# Patient Record
Sex: Male | Born: 1940
Health system: Southern US, Community
[De-identification: ages and names within clinical notes are randomized; demographics above are authoritative.]

## PROBLEM LIST (undated history)

## (undated) DIAGNOSIS — L97919 Non-pressure chronic ulcer of unspecified part of right lower leg with unspecified severity: Secondary | ICD-10-CM

## (undated) DIAGNOSIS — L97929 Non-pressure chronic ulcer of unspecified part of left lower leg with unspecified severity: Secondary | ICD-10-CM

## (undated) DIAGNOSIS — I10 Essential (primary) hypertension: Secondary | ICD-10-CM

## (undated) DIAGNOSIS — I509 Heart failure, unspecified: Secondary | ICD-10-CM

## (undated) DIAGNOSIS — J90 Pleural effusion, not elsewhere classified: Secondary | ICD-10-CM

## (undated) DIAGNOSIS — J189 Pneumonia, unspecified organism: Secondary | ICD-10-CM

## (undated) DIAGNOSIS — I872 Venous insufficiency (chronic) (peripheral): Secondary | ICD-10-CM

## (undated) DIAGNOSIS — C61 Malignant neoplasm of prostate: Secondary | ICD-10-CM

## (undated) DIAGNOSIS — E119 Type 2 diabetes mellitus without complications: Secondary | ICD-10-CM

## (undated) DIAGNOSIS — E785 Hyperlipidemia, unspecified: Secondary | ICD-10-CM

---

## 2001-06-05 ENCOUNTER — Encounter: Admission: RE | Admit: 2001-06-05 | Discharge: 2001-07-10 | Payer: Self-pay | Admitting: Family Medicine

## 2002-02-12 ENCOUNTER — Encounter: Admission: RE | Admit: 2002-02-12 | Discharge: 2002-05-13 | Payer: Self-pay | Admitting: Family Medicine

## 2006-02-16 ENCOUNTER — Encounter: Admission: RE | Admit: 2006-02-16 | Discharge: 2006-05-17 | Payer: Self-pay | Admitting: Family Medicine

## 2006-02-16 ENCOUNTER — Ambulatory Visit: Payer: Self-pay | Admitting: Internal Medicine

## 2006-05-18 ENCOUNTER — Encounter: Admission: RE | Admit: 2006-05-18 | Discharge: 2006-05-18 | Payer: Self-pay | Admitting: Family Medicine

## 2006-08-29 ENCOUNTER — Encounter (HOSPITAL_BASED_OUTPATIENT_CLINIC_OR_DEPARTMENT_OTHER): Admission: RE | Admit: 2006-08-29 | Discharge: 2006-11-27 | Payer: Self-pay | Admitting: Surgery

## 2006-12-05 ENCOUNTER — Encounter (HOSPITAL_BASED_OUTPATIENT_CLINIC_OR_DEPARTMENT_OTHER): Admission: RE | Admit: 2006-12-05 | Discharge: 2006-12-07 | Payer: Self-pay | Admitting: Surgery

## 2006-12-12 ENCOUNTER — Encounter (HOSPITAL_BASED_OUTPATIENT_CLINIC_OR_DEPARTMENT_OTHER): Admission: RE | Admit: 2006-12-12 | Discharge: 2007-03-12 | Payer: Self-pay | Admitting: Surgery

## 2007-03-19 ENCOUNTER — Encounter (HOSPITAL_BASED_OUTPATIENT_CLINIC_OR_DEPARTMENT_OTHER): Admission: RE | Admit: 2007-03-19 | Discharge: 2007-06-17 | Payer: Self-pay | Admitting: Surgery

## 2007-06-17 ENCOUNTER — Ambulatory Visit: Payer: Self-pay | Admitting: Vascular Surgery

## 2007-06-17 ENCOUNTER — Ambulatory Visit (HOSPITAL_COMMUNITY): Admission: RE | Admit: 2007-06-17 | Discharge: 2007-06-17 | Payer: Self-pay | Admitting: *Deleted

## 2007-06-17 ENCOUNTER — Encounter (HOSPITAL_BASED_OUTPATIENT_CLINIC_OR_DEPARTMENT_OTHER): Payer: Self-pay | Admitting: Internal Medicine

## 2007-06-20 ENCOUNTER — Encounter (HOSPITAL_BASED_OUTPATIENT_CLINIC_OR_DEPARTMENT_OTHER): Admission: RE | Admit: 2007-06-20 | Discharge: 2007-09-10 | Payer: Self-pay | Admitting: Internal Medicine

## 2007-09-16 ENCOUNTER — Encounter (HOSPITAL_BASED_OUTPATIENT_CLINIC_OR_DEPARTMENT_OTHER): Admission: RE | Admit: 2007-09-16 | Discharge: 2007-12-15 | Payer: Self-pay | Admitting: Surgery

## 2007-12-10 ENCOUNTER — Encounter (HOSPITAL_BASED_OUTPATIENT_CLINIC_OR_DEPARTMENT_OTHER): Admission: RE | Admit: 2007-12-10 | Discharge: 2008-03-09 | Payer: Self-pay | Admitting: Surgery

## 2008-03-10 ENCOUNTER — Encounter (HOSPITAL_BASED_OUTPATIENT_CLINIC_OR_DEPARTMENT_OTHER): Admission: RE | Admit: 2008-03-10 | Discharge: 2008-06-08 | Payer: Self-pay | Admitting: Internal Medicine

## 2008-06-10 ENCOUNTER — Encounter (HOSPITAL_BASED_OUTPATIENT_CLINIC_OR_DEPARTMENT_OTHER): Admission: RE | Admit: 2008-06-10 | Discharge: 2008-09-04 | Payer: Self-pay | Admitting: Internal Medicine

## 2008-09-16 ENCOUNTER — Encounter (HOSPITAL_BASED_OUTPATIENT_CLINIC_OR_DEPARTMENT_OTHER): Admission: RE | Admit: 2008-09-16 | Discharge: 2008-12-09 | Payer: Self-pay | Admitting: Internal Medicine

## 2008-12-15 ENCOUNTER — Encounter (HOSPITAL_BASED_OUTPATIENT_CLINIC_OR_DEPARTMENT_OTHER): Admission: RE | Admit: 2008-12-15 | Discharge: 2009-03-15 | Payer: Self-pay | Admitting: Internal Medicine

## 2009-02-05 ENCOUNTER — Encounter: Admission: RE | Admit: 2009-02-05 | Discharge: 2009-02-25 | Payer: Self-pay | Admitting: Family Medicine

## 2009-03-16 ENCOUNTER — Encounter (HOSPITAL_BASED_OUTPATIENT_CLINIC_OR_DEPARTMENT_OTHER): Admission: RE | Admit: 2009-03-16 | Discharge: 2009-06-14 | Payer: Self-pay | Admitting: Internal Medicine

## 2009-06-16 ENCOUNTER — Encounter (HOSPITAL_BASED_OUTPATIENT_CLINIC_OR_DEPARTMENT_OTHER): Admission: RE | Admit: 2009-06-16 | Discharge: 2009-09-14 | Payer: Self-pay | Admitting: Internal Medicine

## 2009-09-22 ENCOUNTER — Encounter (HOSPITAL_BASED_OUTPATIENT_CLINIC_OR_DEPARTMENT_OTHER): Admission: RE | Admit: 2009-09-22 | Discharge: 2009-11-22 | Payer: Self-pay | Admitting: Internal Medicine

## 2009-12-14 ENCOUNTER — Encounter (HOSPITAL_BASED_OUTPATIENT_CLINIC_OR_DEPARTMENT_OTHER): Admission: RE | Admit: 2009-12-14 | Discharge: 2010-03-14 | Payer: Self-pay | Admitting: Internal Medicine

## 2009-12-16 ENCOUNTER — Ambulatory Visit (HOSPITAL_COMMUNITY): Admission: RE | Admit: 2009-12-16 | Discharge: 2009-12-16 | Payer: Self-pay | Admitting: Urology

## 2009-12-28 ENCOUNTER — Ambulatory Visit: Admission: RE | Admit: 2009-12-28 | Discharge: 2010-03-23 | Payer: Self-pay | Admitting: Radiation Oncology

## 2010-03-24 ENCOUNTER — Ambulatory Visit: Admission: RE | Admit: 2010-03-24 | Discharge: 2010-05-20 | Payer: Self-pay | Admitting: Radiation Oncology

## 2010-09-05 ENCOUNTER — Inpatient Hospital Stay (HOSPITAL_COMMUNITY): Admission: EM | Admit: 2010-09-05 | Discharge: 2010-09-13 | Payer: Self-pay | Admitting: Emergency Medicine

## 2010-09-05 ENCOUNTER — Encounter: Payer: Self-pay | Admitting: Family Medicine

## 2010-09-05 LAB — CONVERTED CEMR LAB: Hgb A1c MFr Bld: 6.9 %

## 2010-09-08 ENCOUNTER — Ambulatory Visit: Payer: Self-pay | Admitting: Vascular Surgery

## 2010-09-09 ENCOUNTER — Ambulatory Visit: Payer: Self-pay | Admitting: Infectious Diseases

## 2010-09-10 ENCOUNTER — Encounter: Payer: Self-pay | Admitting: Family Medicine

## 2010-09-11 ENCOUNTER — Encounter: Payer: Self-pay | Admitting: Family Medicine

## 2010-09-11 LAB — CONVERTED CEMR LAB
Creatinine, Ser: 1.1 mg/dL
Glucose, Urine, Semiquant: 90
Sodium: 140 meq/L

## 2010-09-13 ENCOUNTER — Ambulatory Visit: Payer: Self-pay | Admitting: Family Medicine

## 2010-09-13 DIAGNOSIS — L97909 Non-pressure chronic ulcer of unspecified part of unspecified lower leg with unspecified severity: Secondary | ICD-10-CM

## 2010-09-13 DIAGNOSIS — E1169 Type 2 diabetes mellitus with other specified complication: Secondary | ICD-10-CM | POA: Insufficient documentation

## 2010-09-13 DIAGNOSIS — E785 Hyperlipidemia, unspecified: Secondary | ICD-10-CM | POA: Insufficient documentation

## 2010-09-13 DIAGNOSIS — I10 Essential (primary) hypertension: Secondary | ICD-10-CM

## 2010-09-13 DIAGNOSIS — C61 Malignant neoplasm of prostate: Secondary | ICD-10-CM

## 2010-09-13 DIAGNOSIS — F341 Dysthymic disorder: Secondary | ICD-10-CM

## 2010-09-13 DIAGNOSIS — M549 Dorsalgia, unspecified: Secondary | ICD-10-CM

## 2010-09-13 DIAGNOSIS — Z96649 Presence of unspecified artificial hip joint: Secondary | ICD-10-CM

## 2010-09-13 DIAGNOSIS — I83009 Varicose veins of unspecified lower extremity with ulcer of unspecified site: Secondary | ICD-10-CM

## 2010-09-13 DIAGNOSIS — N3941 Urge incontinence: Secondary | ICD-10-CM | POA: Insufficient documentation

## 2010-09-13 DIAGNOSIS — I872 Venous insufficiency (chronic) (peripheral): Secondary | ICD-10-CM

## 2010-09-13 DIAGNOSIS — M1610 Unilateral primary osteoarthritis, unspecified hip: Secondary | ICD-10-CM | POA: Insufficient documentation

## 2010-09-13 DIAGNOSIS — M169 Osteoarthritis of hip, unspecified: Secondary | ICD-10-CM

## 2010-09-13 DIAGNOSIS — R269 Unspecified abnormalities of gait and mobility: Secondary | ICD-10-CM

## 2010-09-21 ENCOUNTER — Encounter: Payer: Self-pay | Admitting: Pharmacist

## 2010-09-28 ENCOUNTER — Encounter: Payer: Self-pay | Admitting: Pharmacist

## 2010-09-29 ENCOUNTER — Encounter: Payer: Self-pay | Admitting: Family Medicine

## 2010-09-30 ENCOUNTER — Encounter: Payer: Self-pay | Admitting: Family Medicine

## 2010-09-30 ENCOUNTER — Telehealth: Payer: Self-pay | Admitting: Family Medicine

## 2010-10-05 ENCOUNTER — Encounter: Payer: Self-pay | Admitting: Family Medicine

## 2010-10-05 ENCOUNTER — Encounter: Payer: Self-pay | Admitting: Pharmacist

## 2010-10-19 ENCOUNTER — Encounter: Payer: Self-pay | Admitting: Family Medicine

## 2010-11-01 ENCOUNTER — Encounter: Payer: Self-pay | Admitting: Family Medicine

## 2010-11-02 ENCOUNTER — Ambulatory Visit: Payer: Self-pay | Admitting: Family Medicine

## 2010-11-03 ENCOUNTER — Encounter: Payer: Self-pay | Admitting: Family Medicine

## 2010-11-03 LAB — CONVERTED CEMR LAB
ALT: 11 units/L
AST: 16 units/L
Alkaline Phosphatase: 120 units/L
BUN: 22 mg/dL
Potassium: 4.2 meq/L
Sodium: 141 meq/L

## 2010-11-16 ENCOUNTER — Encounter: Payer: Self-pay | Admitting: Pharmacist

## 2010-11-16 ENCOUNTER — Encounter: Payer: Self-pay | Admitting: Family Medicine

## 2010-11-16 DIAGNOSIS — L89609 Pressure ulcer of unspecified heel, unspecified stage: Secondary | ICD-10-CM | POA: Insufficient documentation

## 2010-11-23 ENCOUNTER — Encounter: Payer: Self-pay | Admitting: Family Medicine

## 2010-11-23 ENCOUNTER — Encounter: Payer: Self-pay | Admitting: Pharmacist

## 2010-11-23 DIAGNOSIS — M67919 Unspecified disorder of synovium and tendon, unspecified shoulder: Secondary | ICD-10-CM | POA: Insufficient documentation

## 2010-11-23 DIAGNOSIS — M719 Bursopathy, unspecified: Secondary | ICD-10-CM

## 2010-11-30 ENCOUNTER — Encounter: Payer: Self-pay | Admitting: Family Medicine

## 2010-11-30 LAB — CONVERTED CEMR LAB
Cholesterol: 109 mg/dL
HDL: 37 mg/dL
Triglycerides: 40 mg/dL

## 2010-12-02 ENCOUNTER — Encounter: Payer: Self-pay | Admitting: Family Medicine

## 2010-12-02 LAB — CONVERTED CEMR LAB
HCT: 35.6 %
MCV: 90.6 fL
WBC: 6.6 10*3/uL

## 2010-12-07 ENCOUNTER — Encounter: Payer: Self-pay | Admitting: Family Medicine

## 2010-12-09 ENCOUNTER — Encounter: Payer: Self-pay | Admitting: Family Medicine

## 2010-12-09 LAB — CONVERTED CEMR LAB
BUN: 19 mg/dL
Calcium: 8.7 mg/dL
Creatinine, Ser: 0.95 mg/dL
Glucose: 130 mg/dL
Potassium: 4.2 meq/L
Sodium: 142 meq/L

## 2010-12-19 ENCOUNTER — Encounter: Payer: Self-pay | Admitting: Family Medicine

## 2010-12-23 ENCOUNTER — Encounter: Payer: Self-pay | Admitting: Family Medicine

## 2011-01-01 ENCOUNTER — Encounter: Payer: Self-pay | Admitting: Urology

## 2011-01-01 DIAGNOSIS — S81809A Unspecified open wound, unspecified lower leg, initial encounter: Secondary | ICD-10-CM

## 2011-01-01 DIAGNOSIS — S91009A Unspecified open wound, unspecified ankle, initial encounter: Secondary | ICD-10-CM

## 2011-01-01 DIAGNOSIS — S81009A Unspecified open wound, unspecified knee, initial encounter: Secondary | ICD-10-CM | POA: Insufficient documentation

## 2011-01-02 ENCOUNTER — Encounter (HOSPITAL_BASED_OUTPATIENT_CLINIC_OR_DEPARTMENT_OTHER)
Admission: RE | Admit: 2011-01-02 | Discharge: 2011-01-10 | Payer: Self-pay | Source: Home / Self Care | Attending: General Surgery | Admitting: General Surgery

## 2011-01-04 ENCOUNTER — Encounter: Payer: Self-pay | Admitting: Pharmacist

## 2011-01-06 ENCOUNTER — Telehealth: Payer: Self-pay | Admitting: *Deleted

## 2011-01-10 ENCOUNTER — Encounter: Payer: Self-pay | Admitting: Family Medicine

## 2011-01-12 ENCOUNTER — Encounter (INDEPENDENT_AMBULATORY_CARE_PROVIDER_SITE_OTHER): Payer: Self-pay | Admitting: Family Medicine

## 2011-01-12 ENCOUNTER — Encounter: Payer: Self-pay | Admitting: Family Medicine

## 2011-01-12 ENCOUNTER — Ambulatory Visit: Admit: 2011-01-12 | Payer: Self-pay

## 2011-01-12 DIAGNOSIS — I83009 Varicose veins of unspecified lower extremity with ulcer of unspecified site: Secondary | ICD-10-CM

## 2011-01-12 DIAGNOSIS — M169 Osteoarthritis of hip, unspecified: Secondary | ICD-10-CM

## 2011-01-12 DIAGNOSIS — I872 Venous insufficiency (chronic) (peripheral): Secondary | ICD-10-CM

## 2011-01-12 LAB — CONVERTED CEMR LAB
BUN: 32 mg/dL — ABNORMAL HIGH (ref 6–23)
BUN: 32 mg/dL — ABNORMAL HIGH (ref 6–23)
Calcium: 9.2 mg/dL (ref 8.4–10.5)
Calcium: 9.2 mg/dL (ref 8.4–10.5)
Chloride: 103 meq/L (ref 96–112)
Chloride: 103 meq/L (ref 96–112)
Creatinine, Ser: 1.22 mg/dL (ref 0.40–1.50)
Glucose, Bld: 163 mg/dL — ABNORMAL HIGH (ref 70–99)
MCV: 85.6 fL (ref 78.0–100.0)
Platelets: 310 10*3/uL (ref 150–400)
Potassium: 4.1 meq/L (ref 3.5–5.3)
RBC: 4.37 M/uL (ref 4.22–5.81)
Sodium: 137 meq/L (ref 135–145)
Sodium: 137 meq/L (ref 135–145)
WBC: 8.2 10*3/uL (ref 4.0–10.5)
WBC: 8.2 10*3/uL (ref 4.0–10.5)

## 2011-01-12 NOTE — Progress Notes (Signed)
  Phone Note Other Incoming   Caller: Nursing Home Summary of Call: Called from Nursing Home staff.  Stated that they received medication change today, orders written in chart to decrease Norvasc from 10 mg to 5 mg daily.  This is consistent with [facility] notes.  However, the nurse states that patient was not on ANY Norvasc to start with.  Without knowing anything else about this patient, instructed nurse not to start any new medications until we can clarify how much Norvasc patient is to be on, if he is truly to be on any at all.   Initial call taken by: Renold Don MD,  September 30, 2010 7:55 PM

## 2011-01-12 NOTE — Miscellaneous (Signed)
Summary: request for aide & PT  Clinical Lists Changes Elnita Maxwell from [facility] called [phone]) and asked for an order for PT & a home health aide for bathing. states it took the wife 3 hours to get him up & dressed this am. told her I will forward to pcp.Golden Circle RN  December 23, 2010 2:30 PM     start of care RN is in the home now, pt not doing well. They are needing these orders asap. Denny Peon Odell  December 23, 2010 3:00 PM   rn states he cannot walk, bathe or dress self. [facility] can start the PT asap & aide can start monday. explained again pcp is not here. she insists that another md sign for PT. given to preceptor.Golden Circle RN  December 23, 2010 3:14 PM  ok per Dr. Tressia Danas. verbal given to RN.Golden Circle RN  December 23, 2010 3:21 PM

## 2011-01-12 NOTE — Miscellaneous (Signed)
Summary: Med Update  Clinical Lists Changes  Medications: Changed medication from NOVOLIN 70/30 70-30 % SUSP (INSULIN ISOPHANE & REGULAR) 55 units in AM and 51 in PM to NOVOLIN 70/30 70-30 % SUSP (INSULIN ISOPHANE & REGULAR) 45 units in AM and 35 in PM - Signed Rx of NOVOLIN 70/30 70-30 % SUSP (INSULIN ISOPHANE & REGULAR) 45 units in AM and 35 in PM;  #1 x 0;  Signed;  Entered by: Christian Mate D;  Authorized by: Madelon Lips Pharm D;  Method used: Historical    Prescriptions: NOVOLIN 70/30 70-30 % SUSP (INSULIN ISOPHANE & REGULAR) 45 units in AM and 35 in PM  #1 x 0   Entered and Authorized by:   Christian Mate D   Signed by:   Madelon Lips Pharm D on 09/28/2010   Method used:   Historical   RxID:   [id number]

## 2011-01-12 NOTE — Miscellaneous (Signed)
Summary: Med Update - Rx  Clinical Lists Changes  Medications: Removed medication of GABAPENTIN 300 MG CAPS (GABAPENTIN) qid Added new medication of GABAPENTIN 400 MG CAPS (GABAPENTIN) four times daily - Signed Changed medication from NOVOLIN 70/30 70-30 % SUSP (INSULIN ISOPHANE & REGULAR) 30 units in AM and 20 in PM to NOVOLIN 70/30 70-30 % SUSP (INSULIN ISOPHANE & REGULAR) 26 units in AM and 12  in PM - Signed Added new medication of VITAMIN D (ERGOCALCIFEROL) 50000 UNIT CAPS (ERGOCALCIFEROL) once monthly X 8 weeks - Signed Rx of GABAPENTIN 400 MG CAPS (GABAPENTIN) four times daily;  #1 x 0;  Signed;  Entered by: Christian Mate D;  Authorized by: Madelon Lips Pharm D;  Method used: Historical Rx of NOVOLIN 70/30 70-30 % SUSP (INSULIN ISOPHANE & REGULAR) 26 units in AM and 12  in PM;  #1 x 0;  Signed;  Entered by: Christian Mate D;  Authorized by: Madelon Lips Pharm D;  Method used: Historical Rx of VITAMIN D (ERGOCALCIFEROL) 50000 UNIT CAPS (ERGOCALCIFEROL) once monthly X 8 weeks;  #1 x 0;  Signed;  Entered by: Christian Mate D;  Authorized by: Madelon Lips Pharm D;  Method used: Historical    Prescriptions: VITAMIN D (ERGOCALCIFEROL) 50000 UNIT CAPS (ERGOCALCIFEROL) once monthly X 8 weeks  #1 x 0   Entered and Authorized by:   Christian Mate D   Signed by:   Madelon Lips Pharm D on 11/16/2010   Method used:   Historical   RxID:   [id number] NOVOLIN 70/30 70-30 % SUSP (INSULIN ISOPHANE & REGULAR) 26 units in AM and 12  in PM  #1 x 0   Entered and Authorized by:   Christian Mate D   Signed by:   Madelon Lips Pharm D on 11/16/2010   Method used:   Historical   RxID:   [id number] GABAPENTIN 400 MG CAPS (GABAPENTIN) four times daily  #1 x 0   Entered and Authorized by:   Christian Mate D   Signed by:   Madelon Lips Pharm D on 11/16/2010   Method used:   Historical   RxID:   [id number]

## 2011-01-12 NOTE — Progress Notes (Signed)
This encounter was created in error - please disregard.

## 2011-01-12 NOTE — Miscellaneous (Signed)
Summary: Med Update - Rx  Clinical Lists Changes  Medications: Added new medication of BISCOLAX 10 MG SUPP (BISACODYL) as needed - Signed Added new medication of NYSTATIN 100000 UNIT/GM CREA (NYSTATIN) Apply to abdominal folds two times a day - Signed Removed medication of OXYCODONE HCL 10 MG TABS (OXYCODONE HCL) one by mouth QPM Changed medication from OXYCODONE HCL 5 MG TABS (OXYCODONE HCL) 1 by mouth QAM and mid-day to OXYCODONE HCL 5 MG TABS (OXYCODONE HCL) 1 by mouth QAM and mid-day and TWO (10mg ) at at bedtime - Signed Rx of BISCOLAX 10 MG SUPP (BISACODYL) as needed;  #1 x 0;  Signed;  Entered by: Christian Mate D;  Authorized by: Madelon Lips Pharm D;  Method used: Historical Rx of NYSTATIN 100000 UNIT/GM CREA (NYSTATIN) Apply to abdominal folds two times a day;  #1 x 0;  Signed;  Entered by: Christian Mate D;  Authorized by: Madelon Lips Pharm D;  Method used: Historical Rx of OXYCODONE HCL 5 MG TABS (OXYCODONE HCL) 1 by mouth QAM and mid-day and TWO (10mg ) at at bedtime;  #1 x 0;  Signed;  Entered by: Christian Mate D;  Authorized by: Madelon Lips Pharm D;  Method used: Historical    Prescriptions: OXYCODONE HCL 5 MG TABS (OXYCODONE HCL) 1 by mouth QAM and mid-day and TWO (10mg ) at at bedtime  #1 x 0   Entered and Authorized by:   Christian Mate D   Signed by:   Madelon Lips Pharm D on 11/23/2010   Method used:   Historical   RxID:   [id number] NYSTATIN 100000 UNIT/GM CREA (NYSTATIN) Apply to abdominal folds two times a day  #1 x 0   Entered and Authorized by:   Christian Mate D   Signed by:   Madelon Lips Pharm D on 11/23/2010   Method used:   Historical   RxID:   [id number] BISCOLAX 10 MG SUPP (BISACODYL) as needed  #1 x 0   Entered and Authorized by:   Christian Mate D   Signed by:   Madelon Lips Pharm D on 11/23/2010   Method used:   Historical   RxID:   [id number]

## 2011-01-12 NOTE — Miscellaneous (Signed)
  Clinical Lists Changes New left heel ulceration, 1.5 cm in diameter, 2mm depth, ulcer bed is soft, skin is dark, the site is painful.  Continues to be treated with lower extremity compression wraps ongoing to maintain integrity of LE skin.  Intertrigo type rash in abdominal skin folds, nursing has been applying topical anifungal powder but feel it is not effective.  Plan:  heel ulcer will need overlay and more importantly pressure relief when in bed.  He will need to continue ongoing Unna boots compression.  Change to antifungal cream to skin folds.  Reassess in one week. Luretha Murphy NP  November 16, 2010 2:47 PM  Problems: Added new problem of DECUBITUS ULCER, HEEL (ICD-707.07) Orders: Added new Test order of Provider Misc Charge- [facility] (Misc) - Signed
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
## 2011-01-12 NOTE — Miscellaneous (Signed)
Summary: Change meds   Clinical Lists Changes  Medications: Changed medication from LOSARTAN POTASSIUM 25 MG TABS (LOSARTAN POTASSIUM) 1 tab by mouth daily to LOSARTAN POTASSIUM 50 MG TABS (LOSARTAN POTASSIUM) 1 tab by mouth daily Changed medication from NOVOLIN 70/30 70-30 % SUSP (INSULIN ISOPHANE & REGULAR) 35 units in AM and 25 in PM to NOVOLIN 70/30 70-30 % SUSP (INSULIN ISOPHANE & REGULAR) 30 units in AM and 20 in PM

## 2011-01-12 NOTE — Miscellaneous (Signed)
Summary: remove room number  Clinical Lists Changes  Observations: Removed observation of PT ROOM#: [id number]  (09/13/2010 16:43)

## 2011-01-12 NOTE — Miscellaneous (Signed)
Summary: Med changes  Clinical Lists Changes  Medications: Added new medication of HYDROXYZINE HCL 25 MG TABS (HYDROXYZINE HCL) 1 tab by mouth every 6 hour as needed itchiness

## 2011-01-12 NOTE — Miscellaneous (Signed)
Summary: [facility] Admission   History of Present Illness: 70 year old male admitted to [facility] for acute rehabilitation following a week long hospitalization for acute mental status changes related to LE cellulitis and acute renal failure.  Patient had a one week decline at home with progressive dysmobility, falls and mental confusion.  He was admitted throught the ER with fever, lactic acidosis and acute renal failure.  He was taken off DIovan, hydrated and renal failure resolved.  LLE with chronic ulcerations, has been followed on and off by the [facility] wound clinic.  CT was negative for osteomyelitis, an MRI was not done because of the patient size.  Wound care was consulted and he was managed with silver dressings and compression wraps.  He was initially treated with IV Vanc and Zosyn, and switched to oral doxycycline the day before discharge with plans to cover for a 2 week course.  His primary MD is Hernand[facility] at [facility].  He recently completed a course of radiation treatment for prostate cancer ( [facility]).  He is followed for chronic pain by Dr. Thad Ranger.  GI MD is T[facility]baum.   He was admitted for wound care, and aggressive therapy to improve function abilities.  He lives with his wife and will return home with her after his rehabilitation.  Family History: MI - F died MI, dementia - M died Sis - CAD  Social History: Lives with wife, she has MS Occupation:Businessman Occupation:  employed  Review of Systems General:  Complains of fatigue, malaise, sleep disorder, and weakness. GI:  Denies constipation. GU:  Denies urinary hesitancy. MS:  Complains of low back pain; right leg pain.  Physical Exam  General:  Alert, poorly groomed, morbildy obese male. Lungs:  normal respiratory effort and normal breath sounds.   Heart:  normal rate, regular rhythm, and no murmur.   Abdomen:  Very large, subcu bleeding from injections Msk:  Poor bed mobility, painful right  leg to straight leg raise or full extension. Extremities:  Dark, scaly, edematous LE Skin:  3 superficial ulcerations with overlying yellow eschar on left leg, blackened eschar on left great toe. Psych:  normally interactive.     Impression & Recommendations:  Problem # 1:  GAIT DISTURBANCE (ICD-781.2)  Patient will need aggressive physical therapy in order to mobilize him, will provide adequate pain control.   Orders: Samya Siciliano Misc Charge- [facility] (Misc)  Problem # 2:  BACK PAIN, CHRONIC (ICD-724.5)  Pain meds scheduled and as needed to inable his to participate in rehab.   The following medications were removed from the medication list:    Vicodin 5-500 Mg Tabs (Hydrocodone-acetaminophen) ..... One q 4 as needed His updated medication list for this problem includes:    Aspirin 81 Mg Tbec (Aspirin) ..... One by mouth every day    Oxycodone Hcl 10 Mg Tabs (Oxycodone hcl) ..... One three times a day scheduled    Tylenol 8 Hour 650 Mg Cr-tabs (Acetaminophen) .Marland Kitchen... Three times a day    Oxycodone Hcl 5 Mg Tabs (Oxycodone hcl) ..... Q 4 as needed for breakthrough  Orders: Amilee Janvier Misc Charge- [facility] (Misc)  Problem # 3:  ANXIETY DEPRESSION (ICD-300.4) On both antidepressants and uses hydroxyzine, may need to try to wean off hydroxyzine as he is also on Vesicare to relax bladder.  He does not seem to have memory loss or problems with delirium currently.  Was using low dose benzo at home but since has been discontinued.  Problem # 4:  DIABETES MELLITUS, TYPE II, ON INSULIN (ICD-250.00)  His updated medication list for this problem includes:    Aspirin 81 Mg Tbec (Aspirin) ..... One by mouth every day    Novolin 70/30 70-30 % Susp (Insulin isophane & regular) .Marland KitchenMarland KitchenMarland KitchenMarland Kitchen 55 units in am and 51 in pm  Orders: Mkenzie Dotts Misc Charge- [facility] (Misc)  Problem # 5:  HYPERTENSION, BENIGN ESSENTIAL (ICD-401.1) Problems with LE edema and ulcerations, calcium channel blocker could be contributing, resume  Diovan once it is certain that renal function has normalized, will get BMET next week. His updated medication list for this problem includes:    Amlodipine Besylate 10 Mg Tabs (Amlodipine besylate) ..... One daily  Problem # 6:  RENAL FAILURE, ACUTE (ICD-584.9) resolved, recheck in 1 week  Problem # 7:  VENOUS INSUFFICIENCY, CHRONIC (ICD-459.81)  Ulcerations on LLE are superficial, will need ongoing compression dressings to promote complete healing.   Orders: Milo Solana Misc Charge- [facility] (Misc)  Problem # 8:  CELLULITIS, LEG, LEFT (ICD-682.6) 2 weeks of doxycycline per hospital discharge, leg appeared non infected. His updated medication list for this problem includes:    Doxycycline Hyclate 100 Mg Tabs (Doxycycline hyclate) .Marland Kitchen... For 13 days from 09/12/10  Problem # 9:  OBESITY, MORBID (ICD-278.01)  Weight loss is essential for him to remain mobile.  Will restrict sugars and have nutrition discuss carb reduction.  Orders: Ksenia Kunz Misc Charge- [facility] (Misc)  Complete Medication List: 1)  Doxycycline Hyclate 100 Mg Tabs (Doxycycline hyclate) .... For 13 days from 09/12/10 2)  Amlodipine Besylate 10 Mg Tabs (Amlodipine besylate) .... One daily 3)  Aspirin 81 Mg Tbec (Aspirin) .... One by mouth every day 4)  Budeprion Xl 300 Mg Tb24 (Bupropion hcl) .Marland Kitchen.. 1 by mouth daily 5)  Gabapentin 300 Mg Caps (Gabapentin) .... Qid 6)  Hydroxyzine Hcl 50 Mg Tabs (Hydroxyzine hcl) .... Q 6 hours prn 7)  Lexapro 10 Mg Tabs (Escitalopram oxalate) .... Daily 8)  Lipitor 80 Mg Tabs (Atorvastatin calcium) .... One qhs 9)  Novolin 70/30 70-30 % Susp (Insulin isophane & regular) .... 55 units in am and 51 in pm 10)  Rapaflo 8 Mg Caps (Silodosin) .... Daily 11)  Vesicare 10 Mg Tabs (Solifenacin succinate) .... One daily 12)  Oxycodone Hcl 10 Mg Tabs (Oxycodone hcl) .... One three times a day scheduled 13)  Tylenol 8 Hour 650 Mg Cr-tabs (Acetaminophen) .... Three times a day 14)  Oxycodone Hcl 5 Mg Tabs  (Oxycodone hcl) .... Q 4 as needed for breakthrough Prescriptions: BUDEPRION XL 300 MG TB24 (BUPROPION HCL) 1 by mouth daily  #90 x 3   Entered and Authorized by:   Luretha Murphy NP   Signed by:   Luretha Murphy NP on 09/13/2010   Method used:   Print then Give to Patient   RxID:   [id number]

## 2011-01-12 NOTE — Miscellaneous (Signed)
Summary: Med Update - Rx  Clinical Lists Changes  Medications: Removed medication of ASPIRIN 81 MG  TBEC (ASPIRIN) one by mouth every day Added new medication of ASPIRIN 325 MG TABS (ASPIRIN) once daily - Signed Rx of ASPIRIN 325 MG TABS (ASPIRIN) once daily;  #1 x 0;  Signed;  Entered by: Christian Mate D;  Authorized by: Madelon Lips Pharm D;  Method used: Historical    Prescriptions: ASPIRIN 325 MG TABS (ASPIRIN) once daily  #1 x 0   Entered and Authorized by:   Christian Mate D   Signed by:   Madelon Lips Pharm D on 11/23/2010   Method used:   Historical   RxID:   [id number]

## 2011-01-12 NOTE — Miscellaneous (Signed)
Summary: Med Update  Clinical Lists Changes  Medications: Removed medication of HYDROXYZINE HCL 50 MG TABS (HYDROXYZINE HCL) q 6 hours prn Added new medication of NIASPAN 1000 MG CR-TABS (NIACIN (ANTIHYPERLIPIDEMIC)) 1 by mouth at bedtime - Signed Changed medication from NOVOLIN 70/30 70-30 % SUSP (INSULIN ISOPHANE & REGULAR) 40 units in AM and 30 in PM to NOVOLIN 70/30 70-30 % SUSP (INSULIN ISOPHANE & REGULAR) 35 units in AM and 25 in PM - Signed Changed medication from OXYCODONE HCL 10 MG TABS (OXYCODONE HCL) one three times a day scheduled to OXYCODONE HCL 10 MG TABS (OXYCODONE HCL) one by mouth QPM - Signed Added new medication of OXYCODONE HCL 5 MG TABS (OXYCODONE HCL) 1 by mouth QAM and mid-day - Signed Rx of NIASPAN 1000 MG CR-TABS (NIACIN (ANTIHYPERLIPIDEMIC)) 1 by mouth at bedtime;  #1 x 0;  Signed;  Entered by: Christian Mate D;  Authorized by: Madelon Lips Pharm D;  Method used: Historical Rx of NOVOLIN 70/30 70-30 % SUSP (INSULIN ISOPHANE & REGULAR) 35 units in AM and 25 in PM;  #1 x 0;  Signed;  Entered by: Christian Mate D;  Authorized by: Madelon Lips Pharm D;  Method used: Historical Rx of OXYCODONE HCL 10 MG TABS (OXYCODONE HCL) one by mouth QPM;  #1 x 0;  Signed;  Entered by: Christian Mate D;  Authorized by: Madelon Lips Pharm D;  Method used: Historical Rx of OXYCODONE HCL 5 MG TABS (OXYCODONE HCL) 1 by mouth QAM and mid-day;  #1 x 0;  Signed;  Entered by: Christian Mate D;  Authorized by: Madelon Lips Pharm D;  Method used: Historical    Prescriptions: OXYCODONE HCL 5 MG TABS (OXYCODONE HCL) 1 by mouth QAM and mid-day  #1 x 0   Entered and Authorized by:   Christian Mate D   Signed by:   Madelon Lips Pharm D on 10/05/2010   Method used:   Historical   RxID:   [id number] OXYCODONE HCL 10 MG TABS (OXYCODONE HCL) one by mouth QPM  #1 x 0   Entered and Authorized by:   Christian Mate D   Signed by:   Madelon Lips Pharm D on 10/05/2010   Method used:    Historical   RxID:   [id number] NOVOLIN 70/30 70-30 % SUSP (INSULIN ISOPHANE & REGULAR) 35 units in AM and 25 in PM  #1 x 0   Entered and Authorized by:   Christian Mate D   Signed by:   Madelon Lips Pharm D on 10/05/2010   Method used:   Historical   RxID:   [id number] NIASPAN 1000 MG CR-TABS (NIACIN (ANTIHYPERLIPIDEMIC)) 1 by mouth at bedtime  #1 x 0   Entered and Authorized by:   Christian Mate D   Signed by:   Madelon Lips Pharm D on 10/05/2010   Method used:   Historical   RxID:   [id number]

## 2011-01-12 NOTE — Miscellaneous (Signed)
Summary: Discharge Summary   Vital Signs:  Patient profile:   70 year old male Pulse rate:   78 / minute Resp:     19 per minute BP sitting:   140 / 70  History of Present Illness: Patient admitted to [facility] on 09/23/10 after an acute hospitalization for acute renal failure, weakness, LE ulcerations and cellulitis.  During his stay in the NH he received aggressive medical and rehab therapy.  Medically, he was agressively given dieuretics resulting in over a 50 pound weight loss.  This was likely also related to improved diet, as he insulin requirements were cut by almost 1/3 during his stay.  His LE ulcerations remained a problem, and was treated with continueous LE compression wraps (Profore Lit).  He reamined on a plan of LE wrapping 6 out of 7 days.  He did develop new ulcerations on and off despite the ongoing compression therapy and required several courses fo antibiotics to treat secondary infection.  His creatitine remained stable and was able to be started back on ARB therapy.  His dieuretic was switched from HCT, to fuorsemide, to eventually torsemide.  The dosage remained low at 10 mg dialy of toresemide.  His mobility is severaly restricted by his overall deconditioned state, morbild obesity, and LE weakness.  He worked hard in rehab and at the time of discharge was able to ......  He was a patient to [facility] and would like to change his care to our practice.  Allergies: No Known Drug Allergies  Physical Exam  Lungs:  normal respiratory effort and normal breath sounds.   Heart:  normal rate, regular rhythm, and no murmur.   Msk:  Can't actively abduct the right arm past 30 degrees or bring his hand up to his head. right arm has full passive range of motion without pain. right mod biceps weakness, but triceps and forearm strength is normal. Some weakness also of right brachioradialis. Marked head forward position with limited range  of motion of neck, but not productive of right arm symptoms  Extremities:  Dark, scaly, edematous LE. 4x5 cm irregular stage 2 ulcer left upper anterior shin, 2 cm very tender L plantar heel, 4x5 post to left med malleolus, 3x4cm ulcer lateral right lower lateral ulcer   Impression & Recommendations:  Problem # 1:  DECUBITUS ULCER, HEEL (ICD-707.07)  left ulcer grew Group B strep. Colonization vs infection. Will treat with Amoxicillin for 7 days  Orders: Provider Misc Charge- [facility] (Misc)  Problem # 2:  VENOUS STASIS ULCER (ICD-454.0)  Continue with Profore's weekly, but will arrange Wound care center visit where he has gone for a couple years since the ulcerations are worsening. follow-up in the [facility] in 2 weeks.   Orders: Provider Misc Charge- [facility] (Misc)  Problem # 3:  OBESITY, MORBID (ICD-278.01)  Gradually improving weight, partially due to improved diuresis  Orders: Provider Misc Charge- [facility] (Misc)  Problem # 4:  HYPERTENSION, BENIGN ESSENTIAL (ICD-401.1)  His updated medication list for this problem includes:    Losartan Potassium 50 Mg Tabs (Losartan potassium) .Marland Kitchen... 1 tab by mouth daily    Torsemide 10 Mg Tabs (Torsemide) .Marland Kitchen... 1 tab by mouth daily  Problem # 5:  DIABETES MELLITUS, TYPE II, ON INSULIN (ICD-250.00)  His updated medication list for this problem includes:    Novolin 70/30 70-30 % Susp (Insulin isophane & regular) .Marland Kitchen... 26 units in am and 12  in pm, qs    Losartan  Potassium 50 Mg Tabs (Losartan potassium) .Marland Kitchen... 1 tab by mouth daily    Aspirin 325 Mg Tabs (Aspirin) ..... Once daily  Orders: Provider Misc Charge- [facility] (Misc)  Complete Medication List: 1)  Budeprion Xl 300 Mg Tb24 (Bupropion hcl) .Marland Kitchen.. 1 by mouth daily 2)  Lexapro 10 Mg Tabs (Escitalopram oxalate) .... Daily 3)  Lipitor 80 Mg Tabs (Atorvastatin calcium) .... One qhs 4)  Novolin 70/30 70-30 % Susp (Insulin isophane & regular) .... 26 units in am and 12  in pm, qs 5)  Rapaflo 8 Mg Caps  (Silodosin) .... Daily 6)  Vesicare 10 Mg Tabs (Solifenacin succinate) .... One daily 7)  Tylenol 8 Hour 650 Mg Cr-tabs (Acetaminophen) .... Three times a day 8)  Oxycodone Hcl 5 Mg Tabs (Oxycodone hcl) .... Q 4 as needed for breakthrough 9)  Losartan Potassium 50 Mg Tabs (Losartan potassium) .Marland Kitchen.. 1 tab by mouth daily 10)  Torsemide 10 Mg Tabs (Torsemide) .Marland Kitchen.. 1 tab by mouth daily 11)  Niaspan 1000 Mg Cr-tabs (Niacin (antihyperlipidemic)) .Marland Kitchen.. 1 by mouth at bedtime 12)  Oxycodone Hcl 5 Mg Tabs (Oxycodone hcl) .Marland Kitchen.. 1 by mouth qam and mid-day and two (10mg ) at at bedtime 13)  Gabapentin 400 Mg Caps (Gabapentin) .... Four times daily 14)  Aspirin 325 Mg Tabs (Aspirin) .... Once daily 15)  Biscolax 10 Mg Supp (Bisacodyl) .... As needed (otc product), qs 16)  Nystatin 100000 Unit/gm Crea (Nystatin) .... Apply to abdominal folds two times a day, large tube Prescriptions: AMOXICILLIN 500 MG CAPS (AMOXICILLIN) take one tab three times a day  #21 x 0   Entered and Authorized by:   Zachery Dauer MD   Signed by:   Zachery Dauer MD on 12/21/2010   Method used:   Electronically to        [facility]  [phone]* (retail)       [address] [place], [place]  [address]       Ph: [phone] or [phone]       Fax: [phone]   RxID:   [id number] BISCOLAX 10 MG SUPP (BISACODYL) as needed (OTC product), QS  #1 x 0   Entered by:   Luretha Murphy NP   Authorized by:   Zachery Dauer MD   Signed by:   Luretha Murphy NP on 12/21/2010   Method used:   Electronically to        [facility]  [phone]* (retail)       [address] [place], [place]  [address]       Ph: [phone] or [phone]       Fax: [phone]   RxID:   [id number] NYSTATIN 100000 UNIT/GM CREA (NYSTATIN) Apply to abdominal folds two times a day, large tube  #1 x 3   Entered by:   Luretha Murphy NP   Authorized by:   Zachery Dauer MD   Signed by:   Luretha Murphy NP on 12/21/2010   Method used:   Electronically to         [facility]  [phone]* (retail)       [address] [place], [place]  [address]       Ph: [phone] or [phone]       Fax: [phone]   RxID:   [id number] GABAPENTIN 400 MG CAPS (GABAPENTIN) four times daily  #120 x 6   Entered by:  Luretha Murphy NP   Authorized by:   Zachery Dauer MD   Signed by:   Luretha Murphy NP on 12/21/2010   Method used:   Electronically to        [facility]  [phone]* (retail)       [address] [place], [place]  [address]       Ph: [phone] or [phone]       Fax: [phone]   RxID:   [id number] NIASPAN 1000 MG CR-TABS (NIACIN (ANTIHYPERLIPIDEMIC)) 1 by mouth at bedtime  #31 x 6   Entered by:   Luretha Murphy NP   Authorized by:   Zachery Dauer MD   Signed by:   Luretha Murphy NP on 12/21/2010   Method used:   Electronically to        [facility]  [phone]* (retail)       [address] [place], [place]  [address]       Ph: [phone] or [phone]       Fax: [phone]   RxID:   [id number] TORSEMIDE 10 MG TABS (TORSEMIDE) 1 tab by mouth daily  #31 x 6   Entered by:   Luretha Murphy NP   Authorized by:   Zachery Dauer MD   Signed by:   Luretha Murphy NP on 12/21/2010   Method used:   Electronically to        [facility]  [phone]* (retail)       [address] [place], [place]  [address]       Ph: [phone] or [phone]       Fax: [phone]   RxID:   [id number] LOSARTAN POTASSIUM 50 MG TABS (LOSARTAN POTASSIUM) 1 tab by mouth daily  #31 x 6   Entered by:   Luretha Murphy NP   Authorized by:   Zachery Dauer MD   Signed by:   Luretha Murphy NP on 12/21/2010   Method used:   Electronically to        [facility]  [phone]* (retail)       [address] [place], [place]  [address]       Ph: [phone] or [phone]       Fax: [phone]   RxID:   [id number] VESICARE 10 MG TABS (SOLIFENACIN SUCCINATE) one daily  #31 x 6   Entered by:   Luretha Murphy NP   Authorized  by:   Zachery Dauer MD   Signed by:   Luretha Murphy NP on 12/21/2010   Method used:   Electronically to        [facility]  [phone]* (retail)       [address] [place], [place]  [address]       Ph: [phone] or [phone]       Fax: [phone]   RxID:   [id number] RAPAFLO 8 MG CAPS (SILODOSIN) daily  #31 x 6   Entered by:   Luretha Murphy NP   Authorized by:   Zachery Dauer MD   Signed by:   Luretha Murphy NP on 12/21/2010   Method used:   Electronically to        [facility]  [phone]* (retail)       [address] [place], [place]  [address]       Ph: [phone] or [phone]       Fax: [phone]   RxID:   [id number] NOVOLIN 70/30 70-30 % SUSP (INSULIN ISOPHANE & REGULAR) 26 units in AM and 12  in PM, QS  #1 x 6   Entered by:   Luretha Murphy NP   Authorized by:   Zachery Dauer MD   Signed by:   Luretha Murphy NP on 12/21/2010   Method used:   Electronically to        [facility]  [phone]* (retail)       [address] [place], [place]  [address]       Ph: [phone] or [phone]       Fax: [phone]   RxID:   [id number] LIPITOR 80 MG TABS (ATORVASTATIN CALCIUM) one qhs  #31 x 6   Entered by:   Luretha Murphy NP   Authorized by:   Zachery Dauer MD   Signed by:   Luretha Murphy NP on 12/21/2010   Method used:   Electronically to        [facility]  [phone]* (retail)       [address] [place], [place]  [address]       Ph: [phone] or [phone]       Fax: [phone]   RxID:   [id number] LEXAPRO 10 MG TABS (ESCITALOPRAM OXALATE) daily  #31 x 6   Entered by:   Luretha Murphy NP   Authorized by:   Zachery Dauer MD   Signed by:   Luretha Murphy NP on 12/21/2010   Method used:   Electronically to        [facility]  [phone]* (retail)       [address] [place], [place]  [address]       Ph: [phone] or [phone]       Fax: [phone]   RxID:   [id number] BUDEPRION XL 300 MG TB24 (BUPROPION  HCL) 1 by mouth daily  #31 x 6   Entered by:   Luretha Murphy NP   Authorized by:   Zachery Dauer MD   Signed by:   Luretha Murphy NP on 12/21/2010   Method used:   Electronically to        [facility]  [phone]* (retail)       [address] [place], [place]  [address]       Ph: [phone] or [phone]       Fax: [phone]   RxID:   [id number]    Orders Added: 1)  Provider Misc Charge- [facility] [Misc]  Appended Document: Wound supplies Supply forms for Profore wraps and Medihoney calcium alginate dressings signed to fax back to [facility]   Clinical Lists Changes  Problems: Added new problem of WOUND, LEG (ICD-891.0) Removed problem of CELLULITIS, LEG, RIGHT (ICD-682.6) Removed problem of INTERTRIGO (ICD-695.89) Removed problem of RENAL FAILURE, ACUTE (ICD-584.9)

## 2011-01-12 NOTE — Progress Notes (Signed)
Summary: needs orders  Phone Note From Other Clinic Call back at [phone]   Caller: [facility] Summary of Call: needs to continue for 2 more visit next week for Home health aid Initial call taken by: De Nurse,  January 06, 2011 4:34 PM  Follow-up for Phone Call        called kim and verbal order given. Follow-up by: Arlyss Repress CMA,,  January 06, 2011 4:46 PM

## 2011-01-12 NOTE — Miscellaneous (Signed)
Summary: Changing meds   Clinical Lists Changes  Medications: Changed medication from AMLODIPINE BESYLATE 10 MG TABS (AMLODIPINE BESYLATE) one daily to AMLODIPINE BESYLATE 5 MG TABS (AMLODIPINE BESYLATE) 1 tab by mouth daily Added new medication of TORSEMIDE 10 MG TABS (TORSEMIDE) 1 tab by mouth daily

## 2011-01-12 NOTE — Miscellaneous (Signed)
Summary: change insulin dose  Clinical Lists Changes  Medications: Changed medication from NOVOLIN 70/30 70-30 % SUSP (INSULIN ISOPHANE & REGULAR) 45 units in AM and 35 in PM to NOVOLIN 70/30 70-30 % SUSP (INSULIN ISOPHANE & REGULAR) 40 units in AM and 30 in PM Removed medication of DOXYCYCLINE HYCLATE 100 MG TABS (DOXYCYCLINE HYCLATE) for 13 days from 09/12/10 Added new medication of LOSARTAN POTASSIUM 25 MG TABS (LOSARTAN POTASSIUM) 1 tab by mouth daily

## 2011-01-12 NOTE — Miscellaneous (Signed)
Summary: Problem List Update  Clinical Lists Changes  Problems: Added new problem of PERSON LIVING IN RESIDENTIAL INSTITUTION (ICD-V60.6)

## 2011-01-12 NOTE — Assessment & Plan Note (Signed)
Summary: NH visit LE cellulitis   Vital Signs:  Patient profile:   70 year old male Temp:     97 degrees F Pulse rate:   70 / minute Resp:     20 per minute BP sitting:   136 / 69  History of Present Illness: Soreness persists left heel and ulcer right lateral leg. He was started on Doxycycline yesterday for presumed MRSA cellulitis. Says he's slowly making progress ambulating but thinks they should push him harder.   Has had right arm weakness for 6 months that has been evaluated by the neurologist in the past. It's not improving. Denies history of trauma, neck pain or arm numbness.   Current Medications (verified): 1)  Budeprion Xl 300 Mg Tb24 (Bupropion Hcl) .Marland Kitchen.. 1 By Mouth Daily 2)  Lexapro 10 Mg Tabs (Escitalopram Oxalate) .... Daily 3)  Lipitor 80 Mg Tabs (Atorvastatin Calcium) .... One Qhs 4)  Novolin 70/30 70-30 % Susp (Insulin Isophane & Regular) .... 26 Units in Am and 12  in Pm 5)  Rapaflo 8 Mg Caps (Silodosin) .... Daily 6)  Vesicare 10 Mg Tabs (Solifenacin Succinate) .... One Daily 7)  Tylenol 8 Hour 650 Mg Cr-Tabs (Acetaminophen) .... Three Times A Day 8)  Oxycodone Hcl 5 Mg Tabs (Oxycodone Hcl) .... Q 4 As Needed For Breakthrough 9)  Losartan Potassium 50 Mg Tabs (Losartan Potassium) .Marland Kitchen.. 1 Tab By Mouth Daily 10)  Torsemide 10 Mg Tabs (Torsemide) .Marland Kitchen.. 1 Tab By Mouth Daily 11)  Niaspan 1000 Mg Cr-Tabs (Niacin (Antihyperlipidemic)) .Marland Kitchen.. 1 By Mouth At Bedtime 12)  Oxycodone Hcl 5 Mg Tabs (Oxycodone Hcl) .Marland Kitchen.. 1 By Mouth Qam and Mid-Day and Two (10mg ) At At Bedtime 13)  Hydroxyzine Hcl 25 Mg Tabs (Hydroxyzine Hcl) .Marland Kitchen.. 1 Tab By Mouth Every 6 Hour As Needed Itchiness 14)  Urea 20 % Crea (Urea) .... Apply Under [employer] 15)  Gabapentin 400 Mg Caps (Gabapentin) .... Four Times Daily 16)  Vitamin D (Ergocalciferol) 50000 Unit Caps (Ergocalciferol) .... Once Monthly X 8 Weeks 17)  Aspirin 325 Mg Tabs (Aspirin) .... Once Daily 18)  Biscolax 10 Mg Supp (Bisacodyl) .... As  Needed 19)  Nystatin 100000 Unit/gm Crea (Nystatin) .... Apply To Abdominal Folds Two Times A Day  Allergies (verified): No Known Drug Allergies  Physical Exam  General:  Alert, poorly groomed, morbildy obese male sitting in a wheelchair that is not as wide as his hips are. Looks smaller Neck:  head forward posture Lungs:  normal respiratory effort and normal breath sounds.   Heart:  normal rate, regular rhythm, and no murmur.   Msk:  Can't actively abduct the right arm past 30 degrees or bring his hand up to his head. right arm has full passive range of motion without pain. right mod biceps weakness, but triceps and forearm strength is normal.  Neurologic:  subjecti soft touch sensation intact in feet, but not normal sensation Skin:  3 superficial ulcerations with overlying yellow eschar on left leg, 2 cm superficial ulceration right lower leg is more tender with granulomatous tissue. Erythematous and hyperkeratotic skin over both lower legs. Stage 2 sore on left heel which is very tender. Hydrocolloid dressing is rolled up.   Psych:  normally interactive.     Impression & Recommendations:  Problem # 1:  UNSPEC DISORDERS BURSAE&TENDONS SHOULDER REGION (ICD-726.10) The right shoulder weakness could be due to a complete rotator cuff tear, but there may be weakness due to neurologic injury, possibly cervical radiculopathy. Will  send release to [facility] and evaluate strength in physical therapy.   Problem # 2:  CELLULITIS, LEG, RIGHT (ICD-682.6) Difficult to separate from stasis dermatitis. See response to Doxy Orders: Ellah Otte Misc Charge- [facility] (Misc)  Problem # 3:  DECUBITUS ULCER, HEEL (ICD-707.07) Continue hydrocolloid dressing.  Orders: Mekhi Sonn Misc Charge- [facility] (Misc)  Problem # 4:  HYPERTENSION, BENIGN ESSENTIAL (ICD-401.1) Assessment: Improved  His updated medication list for this problem includes:    Losartan Potassium 50 Mg Tabs (Losartan potassium) .Marland Kitchen... 1 tab  by mouth daily    Torsemide 10 Mg Tabs (Torsemide) .Marland Kitchen... 1 tab by mouth daily  Orders: Joann Jorge Misc Charge- [facility] (Misc)  Problem # 5:  DIABETES MELLITUS, TYPE II, ON INSULIN (ICD-250.00) A1c was 6.9 in Sept. Due for recheck soon.  His updated medication list for this problem includes:    Novolin 70/30 70-30 % Susp (Insulin isophane & regular) .Marland Kitchen... 26 units in am and 12  in pm    Losartan Potassium 50 Mg Tabs (Losartan potassium) .Marland Kitchen... 1 tab by mouth daily    Aspirin 325 Mg Tabs (Aspirin) ..... Once daily  Orders: Almedia Cordell Misc Charge- [facility] (Misc)  Problem # 6:  HYPERLIPIDEMIA (ICD-272.4) Couldn't find values in Echart.  His updated medication list for this problem includes:    Lipitor 80 Mg Tabs (Atorvastatin calcium) ..... One qhs    Niaspan 1000 Mg Cr-tabs (Niacin (antihyperlipidemic)) .Marland Kitchen... 1 by mouth at bedtime  Complete Medication List: 1)  Budeprion Xl 300 Mg Tb24 (Bupropion hcl) .Marland Kitchen.. 1 by mouth daily 2)  Lexapro 10 Mg Tabs (Escitalopram oxalate) .... Daily 3)  Lipitor 80 Mg Tabs (Atorvastatin calcium) .... One qhs 4)  Novolin 70/30 70-30 % Susp (Insulin isophane & regular) .... 26 units in am and 12  in pm 5)  Rapaflo 8 Mg Caps (Silodosin) .... Daily 6)  Vesicare 10 Mg Tabs (Solifenacin succinate) .... One daily 7)  Tylenol 8 Hour 650 Mg Cr-tabs (Acetaminophen) .... Three times a day 8)  Oxycodone Hcl 5 Mg Tabs (Oxycodone hcl) .... Q 4 as needed for breakthrough 9)  Losartan Potassium 50 Mg Tabs (Losartan potassium) .Marland Kitchen.. 1 tab by mouth daily 10)  Torsemide 10 Mg Tabs (Torsemide) .Marland Kitchen.. 1 tab by mouth daily 11)  Niaspan 1000 Mg Cr-tabs (Niacin (antihyperlipidemic)) .Marland Kitchen.. 1 by mouth at bedtime 12)  Oxycodone Hcl 5 Mg Tabs (Oxycodone hcl) .Marland Kitchen.. 1 by mouth qam and mid-day and two (10mg ) at at bedtime 13)  Hydroxyzine Hcl 25 Mg Tabs (Hydroxyzine hcl) .Marland Kitchen.. 1 tab by mouth every 6 hour as needed itchiness 14)  Urea 20 % Crea (Urea) .... Apply under unna boot. 15)  Gabapentin 400 Mg  Caps (Gabapentin) .... Four times daily 16)  Vitamin D (ergocalciferol) 50000 Unit Caps (Ergocalciferol) .... Once monthly x 8 weeks 17)  Aspirin 325 Mg Tabs (Aspirin) .... Once daily 18)  Biscolax 10 Mg Supp (Bisacodyl) .... As needed 19)  Nystatin 100000 Unit/gm Crea (Nystatin) .... Apply to abdominal folds two times a day   Orders Added: 1)  [facility]k [facility] [[facility]     Prevention & Chronic Care Immunizations   Influenza vaccine: Not documented    Tetanus booster: Not documented    Pneumococcal vaccine: Not documented    H. zoster vaccine: Not documented  Colorectal Screening   Hemoccult: Not documented    Colonoscopy: Not documented  Other Screening   PSA: Not documented   Smoking status: Not documented  Diabetes Mellitus   HgbA1C: 6.9  (  09/05/2010)    Eye exam: Not documented    Foot exam: Not documented   High risk foot: Not documented   Foot care education: Not documented    Urine microalbumin/creatinine ratio: Not documented    Diabetes flowsheet reviewed?: Yes   Progress toward A1C goal: At goal  Lipids   Total Cholesterol: Not documented   LDL: Not documented   LDL Direct: Not documented   HDL: Not documented   Triglycerides: Not documented    SGOT (AST): Not documented   SGPT (ALT): Not documented   Alkaline phosphatase: Not documented   Total bilirubin: Not documented    Lipid flowsheet reviewed?: No   Progress toward LDL goal: Unchanged  Hypertension   Last Blood Pressure: 136 / 69  (11/23/2010)   Serum creatinine: 1.1  (09/11/2010)   Serum potassium 3.9  (09/11/2010)    Hypertension flowsheet reviewed?: Yes   Progress toward BP goal: Improved  Self-Management Support :   Personal Goals (by the next clinic visit) :     Personal A1C goal: 7  (11/23/2010)     Personal blood pressure goal: 130/80  (11/23/2010)     Personal LDL goal: 100  (11/23/2010)    Diabetes self-management support: Not documented    Hypertension  self-management support: Not documented    Lipid self-management support: Not documented

## 2011-01-12 NOTE — Miscellaneous (Signed)
Summary: deleting norvasc  Clinical Lists Changes  Medications: Removed medication of AMLODIPINE BESYLATE 5 MG TABS (AMLODIPINE BESYLATE) 1 tab by mouth daily

## 2011-01-13 ENCOUNTER — Encounter: Payer: Self-pay | Admitting: Family Medicine

## 2011-01-13 ENCOUNTER — Telehealth: Payer: Self-pay | Admitting: Family Medicine

## 2011-01-17 ENCOUNTER — Encounter (HOSPITAL_BASED_OUTPATIENT_CLINIC_OR_DEPARTMENT_OTHER): Payer: Medicare Other | Attending: General Surgery

## 2011-01-17 DIAGNOSIS — E119 Type 2 diabetes mellitus without complications: Secondary | ICD-10-CM | POA: Insufficient documentation

## 2011-01-17 DIAGNOSIS — B964 Proteus (mirabilis) (morganii) as the cause of diseases classified elsewhere: Secondary | ICD-10-CM | POA: Insufficient documentation

## 2011-01-17 DIAGNOSIS — C61 Malignant neoplasm of prostate: Secondary | ICD-10-CM | POA: Insufficient documentation

## 2011-01-17 DIAGNOSIS — A4901 Methicillin susceptible Staphylococcus aureus infection, unspecified site: Secondary | ICD-10-CM | POA: Insufficient documentation

## 2011-01-17 DIAGNOSIS — L97809 Non-pressure chronic ulcer of other part of unspecified lower leg with unspecified severity: Secondary | ICD-10-CM | POA: Insufficient documentation

## 2011-01-18 NOTE — Progress Notes (Addendum)
Summary: orders for OT  Phone Note From Other Clinic Call back at [phone]   Caller: [facility]- Occupational [facility] Summary of Call: is requesting 2 more therapy sessions with him next week Initial call taken by: De Nurse,  January 13, 2011 3:34 PM  Follow-up for Phone Call        to pcp Follow-up by: Golden Circle RN,  January 13, 2011 3:57 PM  Additional Follow-up for Phone Call Additional follow up Details #1::        Please let them know this will be fine. Additional Follow-up by: Zachery Dauer MD,  January 13, 2011 5:09 PM     Appended Document: orders for OT spoke with Selena Batten & told her md had approved the 2 extra sessions

## 2011-01-18 NOTE — Assessment & Plan Note (Addendum)
Summary: [facility] followup/eo   Vital Signs:  Patient profile:   70 year old male Weight:      295 pounds Temp:     98.2 degrees F oral Pulse rate:   94 / minute BP sitting:   114 / 70  (right arm)  Vitals Entered By: Arlyss Repress CMA, (January 12, 2011 8:46 AM) CC: f/u weight, and legs Is Patient Diabetic? Yes Pain Assessment Patient in pain? no        Primary Care Provider:  Zachery Dauer MD  CC:  f/u weight and and legs.  History of Present Illness: Resident- Milinda Antis MD  1. Left Heel Ulcer --- s/p wound culture at wound center and started on Septra DS per last wound center note Jan 23, heel l very tender , pt reports left upper anterior lower leg ulcer/left malleuous ulcer as well  2.  Right leg pain- right anterior lower ext ulcer and ulcer  posterior to the medial malleolus- followed by wound center, also has known arthritis in leg, not taking Tylenol as prescribed, continued pain with movement /standing, taking advil most of the time and oxycodone or vicodin for severe pain, has PT/OT at home  3. Weight- down 20 lbs , known venous insufficnecy, no documented heart failure, feels like his legs have not been swelling as much , taking torosemide daily  4. Prostate CA- pt seeing Dr. Volney American to have PSA checked  today   Medication Reviewed- from memory, pt did not have meds with him   Habits & Providers  Alcohol-Tobacco-Diet     Tobacco Status: never  Problems Prior to Update: 1)  Wound, Leg  (ICD-891.0) 2)  Unspec Disorders Bursae&tendons Shoulder Region  (ICD-726.10) 3)  Decubitus Ulcer, Heel  (ICD-707.07) 4)  Obesity, Morbid  (ICD-278.01) 5)  Diabetes Mellitus, Type II, On Insulin  (ICD-250.00) 6)  Hyperlipidemia  (ICD-272.4) 7)  Hypertension, Benign Essential  (ICD-401.1) 8)  Degenerative Joint Disease, Hips  (ICD-715.95) 9)  Hip Replacement, Bilateral, Hx of  (ICD-V43.64) 10)  Back Pain, Chronic  (ICD-724.5) 11)  Venous Insufficiency, Chronic   (ICD-459.81) 12)  Venous Stasis Ulcer  (ICD-454.0) 13)  Gait Disturbance  (ICD-781.2) 14)  Anxiety Depression  (ICD-300.4) 15)  Adenocarcinoma, Prostate  (ICD-185) 16)  Incontinence, Urge  (ICD-788.31) 17)  Person Living in [facility]idential Institution  (ICD-V60.6)  Current Medications (verified): 1)  Budeprion Xl 300 Mg Tb24 (Bupropion Hcl) .Marland Kitchen.. 1 By Mouth Daily 2)  Lexapro 10 Mg Tabs (Escitalopram Oxalate) .... Daily 3)  Lipitor 80 Mg Tabs (Atorvastatin Calcium) .... One Qhs 4)  Novolin 70/30 70-30 % Susp (Insulin Isophane & Regular) .... 24 Units in Am and 21  in Pm, Qs 5)  Rapaflo 8 Mg Caps (Silodosin) .... Daily 6)  Vesicare 10 Mg Tabs (Solifenacin Succinate) .... One Daily 7)  Tylenol 8 Hour 650 Mg Cr-Tabs (Acetaminophen) .... Three Times A Day 8)  Oxycodone Hcl 5 Mg Tabs (Oxycodone Hcl) .... Take 1-2 Tab By Mouth Three Times A Day As Needed 9)  Losartan Potassium 50 Mg Tabs (Losartan Potassium) .Marland Kitchen.. 1 Tab By Mouth Daily 10)  Torsemide 10 Mg Tabs (Torsemide) .Marland Kitchen.. 1 Tab By Mouth Daily 11)  Niaspan 1000 Mg Cr-Tabs (Niacin (Antihyperlipidemic)) .Marland Kitchen.. 1 By Mouth At Bedtime 12)  Oxycodone Hcl 5 Mg Tabs (Oxycodone Hcl) .Marland Kitchen.. 1 By Mouth Qam and Mid-Day and Two (10mg ) At At Bedtime 13)  Gabapentin 400 Mg Caps (Gabapentin) .... Four Times Daily 14)  Aspirin 325 Mg Tabs (Aspirin) .Marland KitchenMarland KitchenMarland Kitchen  Once Daily 15)  Biscolax 10 Mg Supp (Bisacodyl) .... As Needed (Otc Product), Qs 16)  Nystatin 100000 Unit/gm Crea (Nystatin) .... Apply To Abdominal Folds Two Times A Day, Large Tube 17)  Hydroxyzine Hcl 25 Mg Tabs (Hydroxyzine Hcl) .Marland Kitchen.. 1 By Mouth Q 6hours As Needed Itch 18)  Vitamin D 1000 Unit Tabs (Cholecalciferol) .Marland Kitchen.. 1 By Mouth Daily  Allergies (verified): No Known Drug Allergies  Past History:  Past Medical History: Wound, Leg  (ICD-891.0) 2)  Unspec Disorders Bursae&tendons Shoulder Region  (ICD-726.10) 3)  Decubitus Ulcer, Heel  (ICD-707.07) 4)  Obesity, Morbid  (ICD-278.01) 5)  Diabetes Mellitus,  Type II, On Insulin  (ICD-250.00) 6)  Hyperlipidemia  (ICD-272.4) 7)  Hypertension, Benign Essential  (ICD-401.1) 8)  Degenerative Joint Disease, Hips  (ICD-715.95) 9)  Hip Replacement, Bilateral, Hx of  (ICD-V43.64) 10)  Back Pain, Chronic  (ICD-724.5) 11)  Venous Insufficiency, Chronic  (ICD-459.81) 12)  Venous Stasis Ulcer  (ICD-454.0) 13)  Gait Disturbance  (ICD-781.2) 14)  Anxiety Depression  (ICD-300.4) 15)  Adenocarcinoma, Prostate  (ICD-185) 16)  Incontinence, Urge  (ICD-788.31)  Social History: Smoking Status:  never  Review of Systems       Per HPI  Physical Exam  General:  Alert, , morbildy obese male sitting in a wheelchair , NAD, Vital signs noted  Neck:  head forward posture Lungs:  Normal respiratory effort, chest expands symmetrically. Lungs are clear to auscultation, no crackles or wheezes. Heart:  normal rate, regular rhythm, and no murmur.   Abdomen:  NABS, soft, NT, large pannus, no apparent distension,decreased in sub q pannus edema, intertrigo beneath panus  Skin:  bilat low ext with coverings, Ulceration right medial malleous draining through  Ulceration left anterior leg, draining through dressig intertrigo in groin bilat, beneath pannus  Psych:  Oriented X3, memory intact for recent and remote, good eye contact, and not depressed appearing.     Impression & Recommendations:  Problem # 1:  VENOUS STASIS ULCER (ICD-454.0) Assessment Unchanged Multiple venous stasis ulcers, currently on Bactrim, continue with wound center , ACE wrap applied over bilat dressing  Problem # 2:  VENOUS INSUFFICIENCY, CHRONIC (ICD-459.81) Assessment: Improved Weight down 20 pounds since discharge from [facility], will check CBC, BMET, continue diruretic at current dose. Will need to consider  2 D echo and OSA at next visit. Part of weight loss may related to different scales.   Problem # 3:  DEGENERATIVE JOINT DISEASE, HIPS (ICD-715.95) Assessment:  Unchanged Discussed taking tylenol on a regular basis and avoiding NSAIDS as much as possible, also discused using only oxycodone for severe pain His updated medication list for this problem includes:    Tylenol 8 Hour 650 Mg Cr-tabs (Acetaminophen) .Marland Kitchen... Three times a day    Oxycodone Hcl 5 Mg Tabs (Oxycodone hcl) .Marland Kitchen... Take 1-2 tab by mouth three times a day as needed    Oxycodone Hcl 5 Mg Tabs (Oxycodone hcl) .Marland Kitchen... 1 by mouth qam and mid-day and two (10mg ) at at bedtime    Aspirin 325 Mg Tabs (Aspirin) ..... Once daily  Problem # 4:  OBESITY, MORBID (ICD-278.01) Assessment: Improved  Complete Medication List: 1)  Budeprion Xl 300 Mg Tb24 (Bupropion hcl) .Marland Kitchen.. 1 by mouth daily 2)  Lexapro 10 Mg Tabs (Escitalopram oxalate) .... Daily 3)  Lipitor 80 Mg Tabs (Atorvastatin calcium) .... One qhs 4)  Novolin 70/30 70-30 % Susp (Insulin isophane & regular) .... 24 units in am and 21  in pm, qs 5)  Rapaflo 8 Mg Caps (Silodosin) .... Daily 6)  Vesicare 10 Mg Tabs (Solifenacin succinate) .... One daily 7)  Tylenol 8 Hour 650 Mg Cr-tabs (Acetaminophen) .... Three times a day 8)  Oxycodone Hcl 5 Mg Tabs (Oxycodone hcl) .... Take 1-2 tab by mouth three times a day as needed 9)  Losartan Potassium 50 Mg Tabs (Losartan potassium) .Marland Kitchen.. 1 tab by mouth daily 10)  Torsemide 10 Mg Tabs (Torsemide) .Marland Kitchen.. 1 tab by mouth daily 11)  Niaspan 1000 Mg Cr-tabs (Niacin (antihyperlipidemic)) .Marland Kitchen.. 1 by mouth at bedtime 12)  Oxycodone Hcl 5 Mg Tabs (Oxycodone hcl) .Marland Kitchen.. 1 by mouth qam and mid-day and two (10mg ) at at bedtime 13)  Gabapentin 400 Mg Caps (Gabapentin) .... Four times daily 14)  Aspirin 325 Mg Tabs (Aspirin) .... Once daily 15)  Biscolax 10 Mg Supp (Bisacodyl) .... As needed (otc product), qs 16)  Nystatin 100000 Unit/gm Crea (Nystatin) .... Apply to abdominal folds two times a day, large tube 17)  Hydroxyzine Hcl 25 Mg Tabs (Hydroxyzine hcl) .Marland Kitchen.. 1 by mouth q 6hours as needed itch 18)  Vitamin D 1000 Unit  Tabs (Cholecalciferol) .Marland Kitchen.. 1 by mouth daily  Other Orders: Basic Met[facility] [phone]) CBC[facility] (13244)  Patient Instructions: 1)  Please call your pharmacy to check on the Torsemide (water pill) 2)  Call about your Losartan (blood pressure medication) 3)  Please call for your nystatin cream there are refills at the store 4)  Return in 2 weeks, for weight recheck and we will discuss your labs at the next visit  5)  Schedule a visit with your Eye Doctor (Opthomolagist) 6)  Bring your medications to the next visit    Orders Added: 1)  Basic Met[facility] [[id number]] 2)  CBC[facility] [85027]     Prevention & Chronic Care Immunizations   Influenza vaccine: Not documented    Tetanus booster: Not documented    Pneumococcal vaccine: Not documented    H. zoster vaccine: Not documented  Colorectal Screening   Hemoccult: Not documented    Colonoscopy: Not documented  Other Screening   PSA: Not documented   Smoking status: never  (01/12/2011)  Diabetes Mellitus   HgbA1C: 5.9  (11/30/2010)    Eye exam: Not documented    Foot exam: Not documented   High risk foot: Not documented   Foot care education: Not documented    Urine microalbumin/creatinine ratio: Not documented    Diabetes flowsheet reviewed?: Yes   Progress toward A1C goal: At goal  Lipids   Total Cholesterol: 109  (11/30/2010)   LDL: 64  (11/30/2010)   LDL Direct: Not documented   HDL: 37  (11/30/2010)   Triglycerides: 40  (11/30/2010)    SGOT (AST): 16  (11/03/2010)   SGPT (ALT): 11  (11/03/2010)   Alkaline phosphatase: 120  (11/03/2010)   Total bilirubin: 0.9  (11/03/2010)    Lipid flowsheet reviewed?: Yes   Progress toward LDL goal: At goal  Hypertension   Last Blood Pressure: 114 / 70  (01/12/2011)   Serum creatinine: 0.95  (12/09/2010)   Serum potassium 4.2  (12/09/2010)    Hypertension flowsheet reviewed?: Yes   Progress toward BP goal: At goal  Self-Management Support :   Personal Goals (by  the next clinic visit) :     Personal A1C goal: 7  (11/23/2010)     Personal blood pressure goal: 130/80  (11/23/2010)     Personal LDL goal: 100  (11/23/2010)    Diabetes self-management support: Not documented  Hypertension self-management support: Not documented    Lipid self-management support: Not documented

## 2011-01-20 ENCOUNTER — Telehealth: Payer: Self-pay | Admitting: Family Medicine

## 2011-01-20 NOTE — Telephone Encounter (Signed)
Verbal order given  

## 2011-01-27 ENCOUNTER — Telehealth: Payer: Self-pay | Admitting: Family Medicine

## 2011-01-30 ENCOUNTER — Other Ambulatory Visit: Payer: Self-pay

## 2011-01-30 LAB — GLUCOSE, CAPILLARY: Glucose-Capillary: 133 mg/dL — ABNORMAL HIGH (ref 70–99)

## 2011-01-30 NOTE — Telephone Encounter (Signed)
Called and gave verbal orders to [facility] for PT.Busick, Rodena Medin

## 2011-01-31 ENCOUNTER — Ambulatory Visit: Payer: MEDICARE | Admitting: Family Medicine

## 2011-02-02 ENCOUNTER — Ambulatory Visit: Payer: 59 | Admitting: Family Medicine

## 2011-02-13 ENCOUNTER — Encounter (HOSPITAL_BASED_OUTPATIENT_CLINIC_OR_DEPARTMENT_OTHER): Payer: Medicare Other | Attending: General Surgery

## 2011-02-13 DIAGNOSIS — B964 Proteus (mirabilis) (morganii) as the cause of diseases classified elsewhere: Secondary | ICD-10-CM | POA: Insufficient documentation

## 2011-02-13 DIAGNOSIS — A4901 Methicillin susceptible Staphylococcus aureus infection, unspecified site: Secondary | ICD-10-CM | POA: Insufficient documentation

## 2011-02-13 DIAGNOSIS — L97809 Non-pressure chronic ulcer of other part of unspecified lower leg with unspecified severity: Secondary | ICD-10-CM | POA: Insufficient documentation

## 2011-02-13 DIAGNOSIS — C61 Malignant neoplasm of prostate: Secondary | ICD-10-CM | POA: Insufficient documentation

## 2011-02-13 DIAGNOSIS — E119 Type 2 diabetes mellitus without complications: Secondary | ICD-10-CM | POA: Insufficient documentation

## 2011-02-20 ENCOUNTER — Encounter: Payer: Self-pay | Admitting: Home Health Services

## 2011-02-23 LAB — BASIC METABOLIC PANEL
BUN: 9 mg/dL (ref 6–23)
BUN: 14 mg/dL (ref 6–23)
BUN: 24 mg/dL — ABNORMAL HIGH (ref 6–23)
CO2: 28 mEq/L (ref 19–32)
CO2: 24 mEq/L (ref 19–32)
CO2: 24 mEq/L (ref 19–32)
CO2: 25 mEq/L (ref 19–32)
Calcium: 8.3 mg/dL — ABNORMAL LOW (ref 8.4–10.5)
Calcium: 8.4 mg/dL (ref 8.4–10.5)
Calcium: 8.1 mg/dL — ABNORMAL LOW (ref 8.4–10.5)
Calcium: 8.3 mg/dL — ABNORMAL LOW (ref 8.4–10.5)
Calcium: 8.3 mg/dL — ABNORMAL LOW (ref 8.4–10.5)
Chloride: 109 mEq/L (ref 96–112)
Chloride: 109 mEq/L (ref 96–112)
Chloride: 104 mEq/L (ref 96–112)
Chloride: 108 mEq/L (ref 96–112)
Creatinine, Ser: 1.11 mg/dL (ref 0.4–1.5)
Creatinine, Ser: 1.11 mg/dL (ref 0.4–1.5)
Creatinine, Ser: 1.31 mg/dL (ref 0.4–1.5)
GFR calc Af Amer: >60 mL/min (ref >60)
GFR calc Af Amer: >60 mL/min (ref >60)
GFR calc Af Amer: >60 mL/min (ref >60)
GFR calc Af Amer: >60 mL/min (ref >60)
GFR calc Af Amer: >60 mL/min (ref >60)
GFR calc non Af Amer: >60 mL/min (ref >60)
GFR calc non Af Amer: 42 mL/min — ABNORMAL LOW (ref >60)
GFR calc non Af Amer: >60 mL/min (ref >60)
GFR calc non Af Amer: >60 mL/min (ref >60)
GFR calc non Af Amer: >60 mL/min (ref >60)
Glucose, Bld: 114 mg/dL — ABNORMAL HIGH (ref 70–99)
Glucose, Bld: 120 mg/dL — ABNORMAL HIGH (ref 70–99)
Glucose, Bld: 214 mg/dL — ABNORMAL HIGH (ref 70–99)
Glucose, Bld: 86 mg/dL (ref 70–99)
Potassium: 3.9 mEq/L (ref 3.5–5.1)
Potassium: 3.6 mEq/L (ref 3.5–5.1)
Potassium: 3.8 mEq/L (ref 3.5–5.1)
Potassium: 3.8 mEq/L (ref 3.5–5.1)
Potassium: 3.9 mEq/L (ref 3.5–5.1)
Potassium: 4.1 mEq/L (ref 3.5–5.1)
Sodium: 141 mEq/L (ref 135–145)
Sodium: 136 mEq/L (ref 135–145)
Sodium: 139 mEq/L (ref 135–145)
Sodium: 141 mEq/L (ref 135–145)
Sodium: 141 mEq/L (ref 135–145)

## 2011-02-23 LAB — CBC
HCT: 29.8 % — ABNORMAL LOW (ref 39.0–52.0)
HCT: 31.4 % — ABNORMAL LOW (ref 39.0–52.0)
HCT: 31.5 % — ABNORMAL LOW (ref 39.0–52.0)
HCT: 35.7 % — ABNORMAL LOW (ref 39.0–52.0)
HCT: 36.7 % — ABNORMAL LOW (ref 39.0–52.0)
Hemoglobin: 10.2 g/dL — ABNORMAL LOW (ref 13.0–17.0)
Hemoglobin: 10.8 g/dL — ABNORMAL LOW (ref 13.0–17.0)
MCH: 31.9 pg (ref 26.0–34.0)
MCHC: 34.2 g/dL (ref 30.0–36.0)
MCHC: 34.1 g/dL (ref 30.0–36.0)
MCHC: 34.2 g/dL (ref 30.0–36.0)
MCV: 92.9 fL (ref 78.0–100.0)
Platelets: 185 10*3/uL (ref 150–400)
Platelets: 200 10*3/uL (ref 150–400)
RBC: 3.21 MIL/uL — ABNORMAL LOW (ref 4.22–5.81)
RBC: 3.4 MIL/uL — ABNORMAL LOW (ref 4.22–5.81)
RBC: 3.84 MIL/uL — ABNORMAL LOW (ref 4.22–5.81)
RDW: 14.9 % (ref 11.5–15.5)
RDW: 15 % (ref 11.5–15.5)
RDW: 15.3 % (ref 11.5–15.5)
WBC: 16.5 10*3/uL — ABNORMAL HIGH (ref 4.0–10.5)
WBC: 5.6 10*3/uL (ref 4.0–10.5)
WBC: 7.8 10*3/uL (ref 4.0–10.5)

## 2011-02-23 LAB — DIFFERENTIAL
Basophils Absolute: 0 10*3/uL (ref 0.0–0.1)
Eosinophils Absolute: 0 10*3/uL (ref 0.0–0.7)
Eosinophils Relative: 0 % (ref 0–5)
Lymphocytes Relative: 1 % — ABNORMAL LOW (ref 12–46)
Lymphocytes Relative: 2 % — ABNORMAL LOW (ref 12–46)
Lymphs Abs: 0.4 10*3/uL — ABNORMAL LOW (ref 0.7–4.0)
Monocytes Absolute: 0.4 10*3/uL (ref 0.1–1.0)
Monocytes Relative: 3 % (ref 3–12)
Monocytes Relative: 3 % (ref 3–12)
Neutro Abs: 15.8 10*3/uL — ABNORMAL HIGH (ref 1.7–7.7)

## 2011-02-23 LAB — HEPATIC FUNCTION PANEL
AST: 28 U/L (ref 0–37)
Albumin: 3.6 g/dL (ref 3.5–5.2)
Total Protein: 7.6 g/dL (ref 6.0–8.3)

## 2011-02-23 LAB — GLUCOSE, CAPILLARY
Glucose-Capillary: 140 mg/dL — ABNORMAL HIGH (ref 70–99)
Glucose-Capillary: 143 mg/dL — ABNORMAL HIGH (ref 70–99)
Glucose-Capillary: 168 mg/dL — ABNORMAL HIGH (ref 70–99)
Glucose-Capillary: 169 mg/dL — ABNORMAL HIGH (ref 70–99)
Glucose-Capillary: 87 mg/dL (ref 70–99)
Glucose-Capillary: 110 mg/dL — ABNORMAL HIGH (ref 70–99)
Glucose-Capillary: 115 mg/dL — ABNORMAL HIGH (ref 70–99)
Glucose-Capillary: 139 mg/dL — ABNORMAL HIGH (ref 70–99)
Glucose-Capillary: 144 mg/dL — ABNORMAL HIGH (ref 70–99)
Glucose-Capillary: 185 mg/dL — ABNORMAL HIGH (ref 70–99)
Glucose-Capillary: 200 mg/dL — ABNORMAL HIGH (ref 70–99)
Glucose-Capillary: 260 mg/dL — ABNORMAL HIGH (ref 70–99)
Glucose-Capillary: 95 mg/dL (ref 70–99)

## 2011-02-23 LAB — URINALYSIS, ROUTINE W REFLEX MICROSCOPIC
Bilirubin Urine: NEGATIVE
Specific Gravity, Urine: 1.019 (ref 1.005–1.030)
pH: 6 (ref 5.0–8.0)

## 2011-02-23 LAB — CULTURE, BLOOD (ROUTINE X 2)
Culture  Setup Time: 201109260137
Culture  Setup Time: 201109290855
Culture: NO GROWTH

## 2011-02-23 LAB — PROTIME-INR
INR: 1.11 (ref 0.00–1.49)
Prothrombin Time: 14.5 seconds (ref 11.6–15.2)

## 2011-02-23 LAB — HEMOGLOBIN A1C: Hgb A1c MFr Bld: 6.9 % — ABNORMAL HIGH (ref <5.7)

## 2011-02-23 LAB — URINE MICROSCOPIC-ADD ON

## 2011-02-23 LAB — LACTIC ACID, PLASMA
Lactic Acid, Venous: 1.2 mmol/L (ref 0.5–2.2)
Lactic Acid, Venous: 3.2 mmol/L — ABNORMAL HIGH (ref 0.5–2.2)

## 2011-02-23 LAB — APTT: aPTT: 31 seconds (ref 24–37)

## 2011-02-23 LAB — VANCOMYCIN, TROUGH: Vancomycin Tr: 23.4 ug/mL — ABNORMAL HIGH (ref 10.0–20.0)

## 2011-03-27 ENCOUNTER — Encounter (HOSPITAL_BASED_OUTPATIENT_CLINIC_OR_DEPARTMENT_OTHER): Payer: Medicare Other | Attending: General Surgery

## 2011-03-27 DIAGNOSIS — L97809 Non-pressure chronic ulcer of other part of unspecified lower leg with unspecified severity: Secondary | ICD-10-CM | POA: Insufficient documentation

## 2011-03-27 DIAGNOSIS — B964 Proteus (mirabilis) (morganii) as the cause of diseases classified elsewhere: Secondary | ICD-10-CM | POA: Insufficient documentation

## 2011-03-27 DIAGNOSIS — C61 Malignant neoplasm of prostate: Secondary | ICD-10-CM | POA: Insufficient documentation

## 2011-03-27 DIAGNOSIS — E119 Type 2 diabetes mellitus without complications: Secondary | ICD-10-CM | POA: Insufficient documentation

## 2011-03-27 DIAGNOSIS — A4901 Methicillin susceptible Staphylococcus aureus infection, unspecified site: Secondary | ICD-10-CM | POA: Insufficient documentation

## 2011-04-25 NOTE — Assessment & Plan Note (Signed)
[facility]   NAME:  DARE, SANGER               ACCOUNT NO.:  [id number]   MEDICAL RECORD NO.:  [id number]      DATE OF BIRTH:  August 08, 1941   PHYSICIAN:  Theresia Majors. Tanda Rockers, M.D. VISIT DATE:  05/07/2008                                   OFFICE VISIT   SUBJECTIVE:  Mr. Granillo is a 70 year old man who we follow for bilateral  stasis.  He continues to be ambulatory with his walker.  He reports that  he has had significant fluid accumulation over the past 2 weeks.  There  has been no interim fever.  There has been no increase in dyspnea, and  he denies chest pain.  His blood sugars have been extraordinarily well  with fasting blood sugars in 101-107 range for the last 3 days.   OBJECTIVE:  VITAL SIGNS:  Blood pressure is 149/80, respirations 16,  pulse rate 89, temperature is 98.1, and capillary blood glucose is 121  mg%.  EXTREMITIES:  Inspection of the lower extremity shows that there is 2+  to 3+ edema on the left lower extremity, but there are no ulcerations.  Capillary refill is brisk.  Both extremities are symmetrically warm, but  are not feverish.  There is no evidence of ischemia.  Wound #8 on the  right medial ankle has varying degrees of re-epithelialization.  There  is no evidence of infection or ischemia.   ASSESSMENT:  Persistent bilateral edema with stasis ulcers on the right.   PLAN:  We will continue the patient with external compression wrap  therapy.  We will continue his follow up with the nurse weekly.  He will  be reevaluated by the physician in 2 weeks.      Harold A. Tanda Rockers, M.D.  Electronically Signed     HAN/MEDQ  D:  05/07/2008  T:  05/08/2008  Job:  [id number]

## 2011-04-25 NOTE — Assessment & Plan Note (Signed)
[facility]   NAME:  [name], [name]               ACCOUNT NO.:  [id number]   MEDICAL RECORD NO.:  [id number]      DATE OF BIRTH:  January 19, 1941   PHYSICIAN:  Maxwell Caul, M.D. VISIT DATE:  06/21/2007                                   OFFICE VISIT   PURPOSE OF TODAY'S VISIT:  Followup of severe bilateral venous stasis  ulceration last seen 4 days ago at which time I treated him with Avelox  for cellulitis of the right leg.  He is returning today in followup.   EXAMINATION:  The right leg is considerably less edematous than it was  the other day.  All of this looks much improved.  He is afebrile with a  temperature of 97.5.  The right leg has no open wounds.  He continues to  have open clean granulated wounds on the left medial lower leg.   IMPRESSION:  1. Cellulitis of the right leg much improved on Avelox.  He will      complete his course.  2. Severe bilateral venous stasis with venous stasis ulceration on the      left side.  Will continue with Prisma Hydrogel to the wounds on the      left leg with bilateral Unna wraps.   The patient will be traveling in [place] next week.  We will give an  appointment for 10 days time.           ______________________________  Maxwell Caul, M.D.     MGR/MEDQ  D:  06/21/2007  T:  06/22/2007  Job:  [id number]

## 2011-04-25 NOTE — Discharge Summary (Signed)
NAME:  [name], [name]               ACCOUNT NO.:  [id number]   MEDICAL RECORD NO.:  [id number]          PATIENT TYPE:  REC   LOCATION:  FOOT                         FACILITY:  [facility]   PHYSICIAN:  Lenon Curt. Chilton Si, M.D.  DATE OF BIRTH:  11/26/1941   DATE OF ADMISSION:  08/21/2008  DATE OF DISCHARGE:                               DISCHARGE SUMMARY   WOUND CARE/HYPERBARIC CENTER   HISTORY:  A 70 year old male seen in followup with bilateral venous stasis ulcers,  previously treated with Silverlon, thick ABD pad and Unna boot.  Ulcers  appear to be doing reasonably well.  They are quite shallow and without  pain or purulent debris today.  The wounds have all been remeasured.   PHYSICAL EXAMINATION:  Temp 97.7, pulse 99, respirations 20, blood pressure 151/76.  Blood  glucose 166 mg%.  Inspection of the wound shows shallow slough on wounds but with no  granulation tissue budding through the slough on all wounds.  The wounds  on the right leg have completely healed it.  He is to continue to have  Unna boots applied to the right leg for protection.  The left leg has  improved on current therapy but continues to be quite swollen in  addition to the wounds.  There is also a puffy area at the superior  portion of his shin just below the knee that has a significant amount of  tissue edema and probably represents where the Unna boot was not  applying enough compression to keep the tissue fluid from accumulating.   PLAN:  No further mechanical debridement took place today other than just  wiping the wounds to remove a little of the surface slough.  Dressings  will include Silverlon, a thick ABD pad, an Unna boot bilaterally.  Patient was advised that I felt that the right leg was progressing well  enough that he should be able to use TED hose or other stockings to  protect the leg.  He seemed somewhat resistant to this.  I gave him a  prescription for the TED hose and asked him to bring this  with him to  the next clinic appointment.  He is to have a nurse visit scheduled in  one week for a dressing change and in two weeks to see a physician for  further evaluation of these wounds.      Lenon Curt Chilton Si, M.D.  Electronically Signed     AGG/MEDQ  D:  08/21/2008  T:  08/21/2008  Job:  [id number]

## 2011-04-25 NOTE — Assessment & Plan Note (Signed)
[facility]   NAME:  [name], [name]              ACCOUNT NO.:  [id number]   MEDICAL RECORD NO.:  [id number]            DATE OF BIRTH:   PHYSICIAN:  Theresia Majors. Tanda Rockers, M.D. VISIT DATE:  12/23/2007                                   OFFICE VISIT   SUBJECTIVE:  The patient is a 70 year old man we have followed for  bilateral stasis ulcers.  In the interim, he has continued to use his  sequential leg pump. There has been no fever. Continues to be  ambulatory.   OBJECTIVE:  VITAL SIGNS:  Blood pressure 132/77, respiration rate 20,  pulse rate 86, temperature 98.2.  LOWER EXTREMITIES :  Inspection of the lower extremities shows that the  ulcers, themselves, have decreased. They continued to be clean with 100%  granulation. His edema is judged at 1-2+  bilaterally. There is no  evidence of ischemia.   ASSESSMENT:  Clinical improvement.   PLAN:  We will continue the wound wraps. Will continue the sequential  leg pump and re-evaluate the patient in one week.      Harold A. Tanda Rockers, M.D.  Electronically Signed     HAN/MEDQ  D:  12/23/2007  T:  12/24/2007  Job:  [id number]

## 2011-04-25 NOTE — Consult Note (Signed)
NAME:  [name], [name]               ACCOUNT NO.:  [id number]   MEDICAL RECORD NO.:  [id number]          PATIENT TYPE:  REC   LOCATION:  FOOT                         FACILITY:  [facility]   PHYSICIAN:  Jonelle Sports. Sevier, M.D. DATE OF BIRTH:  July 15, 1941   DATE OF CONSULTATION:  10/16/2007  DATE OF DISCHARGE:                                 CONSULTATION   This 70 year old white male is followed for bilateral venous stasis  disease and lymphedema with bilateral lower extremity ulcerations.   He has been managed with Profore wraps and has used twice daily  sequential pump therapy over the top of this.  He reports that he has  not been faithful in doing this twice daily, but insists that he has  gotten in at least one treatment every day.  He also reports to me that  upon unwrapping his legs on arrival here today that there is a new area  of involvement on his left hallux.   Otherwise, he admits to no increase in pain, no fever or systemic  symptoms.  No noticed drainage and no new problems.   Blood pressure is 165/89, pulse 86 regular, respirations 20, temperature  97.7, capillary blood sugar 111.   The lesion on the left lower extremity anteriorly measures 3.5 x 1.1 cm,  1 cm in depth with a fairly clean granular base.  Likewise, on the  distal right lower extremity in the medial malleolar area now are two  punctate lesions which together define area 0.5 x 0.3 x 0.1 cm.  These  are covered with a fine crust.   Finally, on the dorsomedial aspect of the hallux on the left is a small  encrusted ulceration measuring 0.6 x 0.4 x 0.1 cm.   IMPRESSION:  Chronic venostasis ulceration with limited progress due to  severe lymphedema.   DISPOSITION:  1. The wounds do not appear to need any debridement at this time.  2. The wound on the hallux will be dressed with Neosporin and a Band-      Aid and both extremities will then be placed in Profore wraps.   The patient is instructed to continue his  sequential pump therapy and is  strongly advised to try to do this on a 60-minute twice daily basis.   Follow-up visit will be here to the RN for rewrapping in one week and to  the M.D. in two weeks.           ______________________________  Jonelle Sports Cheryll Cockayne, M.D.     RES/MEDQ  D:  10/16/2007  T:  10/17/2007  Job:  [id number]

## 2011-04-25 NOTE — Assessment & Plan Note (Signed)
[facility]   NAME:  [name], [name]               ACCOUNT NO.:  [id number]   MEDICAL RECORD NO.:  [id number]      DATE OF BIRTH:  01-Jun-1941   PHYSICIAN:  Jonelle Sports. Sevier, M.D.       VISIT DATE:                                   OFFICE VISIT   HISTORY:  This 70 year old man with a chronic venous insufficiency is  seen with a bilateral stasis ulcerations, much worse on the left than on  the right.  Indeed at last visit, a week ago his right lower extremity  was almost healed, but he was continued on a Profore wrap there with a  Silverlon to the wound marginally unhealed area.  His left lower  extremity wounds have required a significant debridement each visit and  have been treated with applications of Aquacel Ag.   He reports a good week since last seen with no particular problems.  He  feels his drainage has decreased considerably.  He has had no fever or  systemic symptoms.  He has noticed no unusual odor.   PHYSICAL EXAMINATION:  Blood pressure 162/79, pulse 86, respirations 20,  blood glucose 128 mg/dL.  Indeed the right lower extremity has only one  tiny pinpoint area near the medial malleolus that is unhealed.  Otherwise that extremity looks pretty good.  The left lower extremity  still has considerable chronic stasis dermatitis-type change and has 2  ulcers that are open on the medial supramalleolar area and 2 that are  open on the lateral supramalleolar area.  These are delineated as 2  dimensions in the patient's chart by the nurse.  These are all covered  with significant degree of slough in their bases.   IMPRESSION:  Continued gradual improvement, chronic stasis ulcerations  left lower extremity.   DISPOSITION:  1. The right lower extremity will be treated with an application of a      Silverlon pad, again placed in a Profore wrap.  2. The left lower extremity is treated with a selective debridement of      the slough and films from all 4 of  these open ulcers.  The bases      then are relatively cleaned.  All are then treated with an      application of Aquacel Ag covered with Sofsorb pads and extremity      is returned to a Profore wrap.   Followup visit will be in 1 week.           ______________________________  Jonelle Sports. Cheryll Cockayne, M.D.     RES/MEDQ  D:  12/23/2008  T:  12/24/2008  Job:  [id number]

## 2011-04-25 NOTE — Assessment & Plan Note (Signed)
[facility]   NAME:  [name], [name]               ACCOUNT NO.:  [id number]   MEDICAL RECORD NO.:  [id number]      DATE OF BIRTH:  1941/07/30   PHYSICIAN:  Lenon Curt. Chilton Si, M.D.   VISIT DATE:  05/14/2009                                   OFFICE VISIT   HISTORY:  A 70 year old male with chronic and recurrent varicose veins  with inflammation and ulceration returns today for reapplication of his  Unna boot.  Wounds have done reasonably well.  He continues to have a  lot of pain in his legs.  He says he thinks that the Neurontin  prescribed about a month ago by Dr. Leanord Hawking may have been of some help  but it is difficult to say.  Legs continue to swell.  Unna boots have  been helpful in controlling the edema.  There has been no fever.  There  has been no odor.   EXAMINATION:  Temperature 98.2, pulse 103, respirations 20, blood  pressure 155/76.  Wound #17 measures 0.4 x 0.3 x 0.11.  wound #21 now  measures 3 x 3 x 0.1 cm and wound #22 left proximal calf now measures  3.5 x 2.0 x 0.1 cm.  Wounds have a healthy-looking base.  There is no  odor.  There is a small amount of drainage.  There is skin turnover with  sheets of old thin scab like material able to be flipped off the surface  or peeled back with forceps.   The patient has not had any cultures done since those of February 05, 2009 which showed Staph aureus present.  Wounds did not appear infected  today.   TREATMENT:  All wounds were coated with Puracol Ag, then hydrogel,  Adaptic and an Radio broadcast assistant.   The patient return in 2 weeks.   ICD-9 code 454.2 varicose veins with inflammation and ulceration.   CPT code 04540.      Lenon Curt Chilton Si, M.D.  Electronically Signed     AGG/MEDQ  D:  05/14/2009  T:  05/15/2009  Job:  [id number]

## 2011-04-25 NOTE — Assessment & Plan Note (Signed)
[facility]   NAME:  LAWSEN, ARNOTT               ACCOUNT NO.:  [id number]   MEDICAL RECORD NO.:  [id number]      DATE OF BIRTH:  October 24, 1941   PHYSICIAN:  Lenon Curt. Chilton Si, M.D.   VISIT DATE:  06/25/2009                                   OFFICE VISIT   HISTORY:  A 70 year old male with chronic venous insufficiency ulcers  returns for recheck today.  He has been in [employer].  He thinks there  is a little increase in odor particularly on the left leg and has been  more discomfort in this leg since he was last seen.  He did have  methicillin-resistant staph aureus cultured out of wounds, February 05, 2009.  He has had no cultures since then.   PHYSICAL EXAMINATION:  Temperature 98, pulse 80, respirations 19, blood  pressure 126/84.  Wound #17 left lower extremity now measuring 1.5 x 1.5  x 0.1 and #21 left lower extremity posteriorly now measuring 1.5 x 1.3 x  0.1.  Wound #23 right medial ankle now measuring 0.3 x 0.3 x 0.1.  The  wounds of the left leg appear dark, discolored, and is yellowish-gray  that is present on the left lower extremity wound posteriorly.  The  wounds at the right medial ankle appear to be healing in well.   TREATMENT:  Tissue culture was obtained for aerobic cultures on the left  leg wounds.  Following this, we applied Silverlon on top of the  ulcerations covered with Adaptic and Unna boot.  The patient to return  in 1 week.  He was given a prescription for Septra DS to be taken twice  daily for 14 days.   ICD-9 code 454.2 varicose veins with ulcer and inflammation.   CPT [id number].      Lenon Curt Chilton Si, M.D.  Electronically Signed     AGG/MEDQ  D:  06/25/2009  T:  06/26/2009  Job:  [id number]

## 2011-04-25 NOTE — Assessment & Plan Note (Signed)
[facility]   NAME:  [name], [name]               ACCOUNT NO.:  [id number]   MEDICAL RECORD NO.:  [id number]      DATE OF BIRTH:  1941-04-15   PHYSICIAN:  Theresia Majors. Tanda Rockers, M.D.      VISIT DATE:                                   OFFICE VISIT   SUBJECTIVE:  [name] is a 70 year old man who has persistent stasis  ulcers.  We have treated him in the interim with Prisma hydrogel and  Unna wraps to both legs.  In the interim, he reports some drainage, mild  malodor, but no fever or pain.   OBJECTIVE:  VITAL SIGNS:  Blood pressure is 141/83, respirations 18,  pulse rate 97, temperature is 97.1.  Capillary blood glucoses 160 mg  percent.  Inspection of the lower extremity shows a persistence of 2 to  3+ edema bilaterally.  There is recurrence of the right medial distal  ulceration, associated with moderate waxy exudate.  Similarly, wounds 1  and 4 are covered with  thickened exudate.  All three wounds underwent a  full-thickness debridement with a 10 blade.  There is no evidence of  ascending cellulitis or abscess formation.  The capillary refill remains  normal.   ASSESSMENT:  Persistent stasis ulcers, no evidence of significant  arterial disease, no evidence of active infection.   PLAN:  Will we will change this patient's wound management, eliminating  the Prisma and hydrogel in favor of a silverlon swath that will release  silver as a function of the exudate.  This patient has been cultured on  multiple occasions, with skin flora imbalance.   We have explained this approach to the patient, in terms that he seems  to understand.  We will use a silver swath throughout with compression.  The patient seems to understand, expressed his gratitude for having been  seen in the clinic and indicates that he will be compliant      Harold A. Tanda Rockers, M.D.  Electronically Signed     HAN/MEDQ  D:  07/01/2007  T:  07/01/2007  Job:  [id number]

## 2011-04-25 NOTE — Assessment & Plan Note (Signed)
[facility]   NAME:  [name], [name]               ACCOUNT NO.:  [id number]   MEDICAL RECORD NO.:  [id number]      DATE OF BIRTH:  09-Oct-1941   PHYSICIAN:  Maxwell Caul, M.D. VISIT DATE:  06/17/2007                                   OFFICE VISIT   REASON FOR VISIT:  Followup of severe bilateral venous stasis with  bilateral ulcerations.  The patient was seen here 1 week ago and treated  with bilateral Unna boot wraps.  He returns today complaining of  increasing swelling of predominately his right leg with increasing pain  along the right medial aspect.   On examination, the right leg is indeed much more swollen and warm than  the left.  I was not exactly certain how much of the hyperemia was  chronic for him.  I therefore sent him for a duplex ultrasound of the  right leg that was negative for DVT.  We went ahead and started him on  Avelox for the presumed possibility of cellulitis in the medial right  leg.   The left leg has some dry wounds on the lateral aspect which were not  debrided.  We applied Prisma Hydrogel and both legs were wrapped in an  Unna.   IMPRESSION/PLAN:  1. Cellulitis of the right leg certainly possible.  Started on Avelox.      I will follow him on Friday.  2. Severe bilateral venous stasis with venous stasis ulceration.  We      applied Prisma Hydrogel to the wounds on the left leg.  Both legs      were wrapped in an Unna wrap.           ______________________________  Maxwell Caul, M.D.     MGR/MEDQ  D:  06/17/2007  T:  06/18/2007  Job:  [id number]

## 2011-04-25 NOTE — Assessment & Plan Note (Signed)
[facility]   NAME:  BRETON, BERNS NO.:  [id number]   MEDICAL RECORD NO.:  [id number]      DATE OF BIRTH:  March 26, 1941   PHYSICIAN:  Joanne Gavel, M.D.              VISIT DATE:                                   OFFICE VISIT   HISTORY OF PRESENT ILLNESS:  A 70 year old male with diabetes and  neuropathy with chronic venous stasis disease and several superficial  ulcerations of the left lower extremity, thought to be due to venous  stasis.  Today, physical examination reveals pulse, blood pressure, and  temperature stable.  The wounds are likewise unchanged in size.   TREATMENT:  All the three wounds are curetted on the surface to remove  slough.  The patient thinks that swelling is better and the wounds are  slightly smaller.  We will repeat Unna boot and see him in 7 days.      Joanne Gavel, M.D.  Electronically Signed     RA/MEDQ  D:  04/28/2009  T:  04/29/2009  Job:  [id number]

## 2011-04-25 NOTE — Assessment & Plan Note (Signed)
[facility]   NAME:  HARVIE, MORUA               ACCOUNT NO.:  [id number]   MEDICAL RECORD NO.:  [id number]      DATE OF BIRTH:  [birth year]   PHYSICIAN:  Leonie Man, M.D.    VISIT DATE:  07/12/2009                                   OFFICE VISIT   PROBLEM:  Bilateral venous stasis disease with bilateral venous stasis  ulcers.  On left lateral leg and the upper portion of the leg, it  measures 2.5 x 1.5 x 0.1; on left lateral leg and the lower aspect of  the left is 1.3 x 0.5 x less than 0.1; and on the right medial ankle,  the ulcer measures 2.5 x 1.5 x 1.0 and there is a new small ulcer on the  medial aspect of the right ankle measuring 1.0 x 0.5 x less than 0.1.   Mr. Ines is a very pleasant morbidly obese 70 year old gentleman whose  current therapy has been for his venous stasis disease, has been with  central wet-to-dry dressings covered by an Radio broadcast assistant.  He returns today  for reevaluation.  He is not complaining of any significant pain,  although he does feel that the lateral aspect of his right leg was  somewhat more painful than over the past week.   PHYSICAL EXAMINATION:  Temperature 98.4, pulse 87, blood pressure is  116/73.  All the wounds show clean.  Granulating beds with no odor and  very minimal drainage.  The wound edges are quite sharp and the  periwound area is not swollen or erythematous.   TREATMENT:  Puracol dressings with hydrogel and Adaptic to be followed  under an Unna paste boot.   DISPOSITION:  Scheduled followup in 1 week.      Leonie Man, M.D.  Electronically Signed     PB/MEDQ  D:  07/12/2009  T:  07/13/2009  Job:  [id number]

## 2011-04-25 NOTE — Assessment & Plan Note (Signed)
[facility]   NAME:  [name], [name]               ACCOUNT NO.:  [id number]   MEDICAL RECORD NO.:  [id number]      DATE OF BIRTH:  Mar 13, 1941   PHYSICIAN:  Jonelle Sports. Sevier, M.D.  VISIT DATE:  12/16/2008                                   OFFICE VISIT   HISTORY:  This 70 year old white male is followed for venous stasis  ulcerations on the lower extremities, a single wound on the right lower  extremity, and multiple on the left, both medially and laterally.  He  has shown a reasonable response to traditional care to this point.   Since last seen here a week ago, he reports no increased pain, in fact,  no odor, no awareness of increased drainage and no fever or systemic  symptoms.   On examination, blood pressure 161/84, pulse 79, respirations 20, and  temperature 97.4.  Self-obtained blood glucose earlier in the day 90  mg/dL.   There is a small lesion now on the right lower extremity which has a  small eschar, but no obvious open ulcer otherwise.   He has a similar lesion on the left lateral foot with a small eschar and  then has 2 major open lesions on the medial aspect of the left ankle and  2 on the lateral aspect, these are all quantified in the chart in terms  of size.  All have significant slough or Biofilm in their base.   IMPRESSION:  Limited progress stasis ulcerations, left lower extremity  with almost essentially healing of that on the right lower extremity.   DISPOSITION:  The wound on the right lower extremity is relieved of its  eschar and indeed is essentially healed.  The same is true with the  small area on the lateral aspect of the left foot.   The 4 larger ulcers on the medial and lateral aspects of the distal left  calf are sharply selectively debrided to clear them of their slough and  Biofilm and indeed their bases look reasonably granular and healthy.   The small wound on the right lower extremity is dressed with Silverlon  patch,  Softsorb pad primarily for protection and that extremity placed  in a Profore wrap.   The left lower extremity is treated with application of Aquacel Ag to  each of the wounds, Softsorb pads and again a Profore wrap.   Home health nurses to change his dressings on Friday and Monday and he  will be seen again here in 1 week.          ______________________________  Jonelle Sports. Cheryll Cockayne, M.D.    RES/MEDQ  D:  12/16/2008  T:  12/17/2008  Job:  [id number]

## 2011-04-25 NOTE — Assessment & Plan Note (Signed)
[facility]   NAME:  GAYLEN, VENNING               ACCOUNT NO.:  [id number]   MEDICAL RECORD NO.:  [id number]      DATE OF BIRTH:  [birth year]   PHYSICIAN:  Jonelle Sports. Sevier, M.D.  VISIT DATE:  02/03/2009                                   OFFICE VISIT   HISTORY:  This 70 year old white male has been followed for stasis  ulcerations on both lower extremities.  Those on the right lower  extremity were healed a week ago and remained healed today.  He  continues with several lesions on the left lower extremity as will be  described.  He has had fairly heavy drainage during the week and reports  considerable new shock-like pains in both lower extremities, which he  thinks (and I concur are likely due to worsening of his very severe  chronic lumbar disk disease).  He has an appointment with a neurosurgeon  tomorrow regarding that.   He denies any odor, fever, or other systemic symptoms related to his  legs.   EXAMINATION:  Blood pressure 148/80, pulse 89, respirations 20,  temperature 98.2.  The 2 former ulcers on the medial aspect near the  malleolus on that left lower extremity are now covered as well and  probably essentially healed.   Laterally on that distal left lower extremity just proximal to the  lateral malleolus has 2 bridged ulcers, which in total measure 8.9 x 4.2  x 0.2 cm and which are filled with a fairly striking slough.  They do  not have an odor.   IMPRESSION:  Some stalling of progress in left lower extremity ulcer.   DISPOSITION:  1. The right lower extremity will be continued in compressive      stocking.  2. The left lower extremity will require no specific treatment to the      medial lesions, although we will since retreating him, otherwise as      we will place Silverlon over those for this particular dressing      change.  3. The lateral ulcers are both sharply debrided using a scalpel to rid      them of the copious slough, and they  get down to a fairly clean      bases.  4. They will be treated with an application of silver alginate covered      by an absorptive pad and that extremity will be to returned to a      Profore wrap.  5. The patient already has a standing appointment in 2 days, and since      the drainage has been heavy and more is anticipated after today's      debridement, we will ask him to keep that appointment.           ______________________________  Jonelle Sports. Cheryll Cockayne, M.D.     RES/MEDQ  D:  02/03/2009  T:  02/04/2009  Job:  [id number]

## 2011-04-25 NOTE — Assessment & Plan Note (Signed)
[facility]   NAME:  [name], [name]               ACCOUNT NO.:  [id number]   MEDICAL RECORD NO.:  [id number]      DATE OF BIRTH:  [birth year]   PHYSICIAN:  Maxwell Caul, M.D. VISIT DATE:  11/01/2007                                   OFFICE VISIT   PURPOSE OF TODAY'S VISIT:  Mr. Letts is a 70 year old man we have been  following for bilateral venostasis.  He has a sequential leg pump at  home which he is using on his right leg.  He has a refractory ulcer on  his left leg anteriorly.  Last visit we used TCA and an Unna wrap.   He continues to have the area on the left lateral lower leg currently  measuring 4.9 x 1.1 x 0.1.  This had a fair amount of necrotic tissue  which underwent a full-thickness debridement with a #15 blade.  We used  LET for anesthesia.  There was no evidence of surrounding infection.  Nothing needed to be cultured.   IMPRESSION:  Venous stasis ulceration.  This underwent a full-thickness  debridement as described above. The base of this is well granulated with  minimal epithelialization at this time.  As there is some depth of the  wound,  I have added Promogran hydrogel and rewrapped with an Unna and  we will see him again in 10-14 days time.           ______________________________  Maxwell Caul, M.D.     MGR/MEDQ  D:  11/01/2007  T:  11/01/2007  Job:  [id number]

## 2011-04-25 NOTE — Discharge Summary (Signed)
NAME:  [name], [name]               ACCOUNT NO.:  [id number]   MEDICAL RECORD NO.:  [id number]          PATIENT TYPE:  REC   LOCATION:  FOOT                         FACILITY:  [facility]   PHYSICIAN:  Barry Dienes. Eloise Harman, M.D.DATE OF BIRTH:  10/14/1941   DATE OF ADMISSION:  06/10/2008  DATE OF DISCHARGE:                               DISCHARGE SUMMARY   SUBJECTIVE:  The patient is a 70 year old Caucasian man who is seen for  evaluation of bilateral venous stasis ulcers.  He has been treated with  Silverlon, thick ABD pad under an Unna boot to the left lower extremity.  The right lower extremity has been treated with compression wrap as the  stasis ulcers tend to recur despite treatment with a lymphedema pump  several times daily.   OBJECTIVE:  VITAL SIGNS:  Blood pressure 128/79, pulse 89, respirations  20, temperature 97.7, capillary blood glucose level was 100. Wound #10  that is located on the left lateral lower extremity measures 6.5 cm x  0.2 cm in depth x 3 cm and does not have significant eschar, but has  minimal necrotic debris with a yellow and red wound base and some red  granulation tissue.  There is no exposed muscle or tendon or bone and no  foul odor.  There was a large amount of serous discharge and there was  mild erythema surrounding the ulcer.   Wound #11 is located on the left lateral ankle region and measures 1.1  cm x 0.9 cm x 0.2 cm.  It does not have eschar, but has minimal necrotic  debris with red and yellow wound base, red granulation tissue, no  exposed muscle tendon or bone, and no foul odor, and a large amount of  serous drainage with peri-wound integrity intact.   Wound #12, located on the left lateral mid lower extremity measures 2.6  cm x 1.9 cm x 0.1 cm.  This wound does not have eschar and has minimal  necrotic debris with red and yellow wound base, red granulation tissue,  no exposed bone, tendon, or muscle, no foul odor, and a large amount of  serous  drainage with an intact peri wound region.   Wound #13, located on the left lateral lower extremity, distally and  measures 2.8 cm x 0.7 cm x 0.1 cm.  This wound has no significant  eschar, has minimal necrotic debris with red to yellow wound base, no  exposed bone or muscle, no foul odor, and a large amount of serous  drainage and intact peri wound area.   Wound #14, located on the left medial ankle measures 1.5 cm x 1 cm x 0.2  cm and has no significant eschar, has minimal necrotic debris, a yellow  wound base, raised granulation tissue, no exposed bone or muscle, and  large amount of serous drainage with intact peri wound integrity.   Wound 15, located on the lateral and anterior left lower extremity, has  resolved.   Wound 16, located on the left lateral ankle approximately,  measures 1.2  cm x 0.8 cm x 0.1 cm.  This wound does  not have significant eschar and  has minimal necrotic debris with red wound base, red granulation tissue,  no exposed bone, tendon, or muscle, and a large amount of serous  drainage.   ASSESSMENT:  Slowly resolving left lower extremity chronic venous stasis  ulcers.   PLAN:  Using a #15 scalpel the following wounds were debrided of the  necrotic debris (wound 10, 11, 12, and 13).  The left leg wounds were  covered with Silverlon with an ABD pad, and then covered with Unna boot.  The right leg was wrapped with an Radio broadcast assistant alone.  The patient will be  seen by a physician in approximately 1 week from today for follow-up.  He should call the clinic if he has any significant discomfort in the  interim.           ______________________________  Barry Dienes Eloise Harman, M.D.     DGP/MEDQ  D:  08/11/2008  T:  08/11/2008  Job:  [id number]

## 2011-04-25 NOTE — Assessment & Plan Note (Signed)
[facility]   NAME:  [name], [name]               ACCOUNT NO.:  [id number]   MEDICAL RECORD NO.:  [id number]      DATE OF BIRTH:  11-Aug-1941   PHYSICIAN:  Theresia Majors. Tanda [name], M.D. VISIT DATE:  07/15/2007                                   OFFICE VISIT   SUBJECTIVE:  [name] is a 70 year old man with bilateral stasis and  ulcerations involving the left lateral leg.  In the interim, he denies  excessive drainage, malodor, pain or fever.  We have continued to treat  him with external compression utilizing an Radio broadcast assistant.   OBJECTIVE:  Blood pressure is 143/80, respirations of 20, pulse rate 89,  temperature 98.3. Capillary blood glucose is 161 mg percent.   Inspection of the lower extremity shows a persistence of 2+ edema  bilaterally with ulcerations persistent on the left lateral leg. The  ulcers had areas of necrosis. An excisional debridement utilizing a #10  blade with removal of soft exudate, necrotic skin, and subcutaneous  tissue was performed without difficulty.  Hemorrhage was controlled with  direct pressure. Capillary refill is normal.  There is no evidence of  ischemia.   IMPRESSION:  Clinical improvement to compression wrap.   PLAN:  We will continue the compression and reevaluate the patient in  one week.  We will place him in bilateral Unna boots and add Iodosorb to  control the exudate.      [name], M.D.  Electronically Signed     HAN/MEDQ  D:  07/15/2007  T:  07/15/2007  Job:  [id number]

## 2011-04-25 NOTE — Assessment & Plan Note (Signed)
[facility]   NAME:  [name], [name]               ACCOUNT NO.:  [id number]   MEDICAL RECORD NO.:  [id number]      DATE OF BIRTH:  February 25, 1941   PHYSICIAN:  Theresia Majors. Tanda Rockers, M.D. VISIT DATE:  09/25/2007                                   OFFICE VISIT   SUBJECTIVE:  [name] is a 70 year old man who we followed for stasis  ulceration.  We have treated him with a combination of Unna wraps,  Apligrafs and most recently we placed him on a sequential leg pump.  He  returns for follow-up.  He denies interim pain, fever or malodor.   OBJECTIVE:  Blood pressure is 143/70, respirations 18, pulse rate 72,  temperature is 97.8.  Capillary blood glucose is 151 mg percent.  Inspection of the left lower extremity shows bilateral 1-2+ edema.  The  ulcers on the right lower extremity are markedly decreased in volume,  wounds 2B and 4 have mature well adherent eschars with no drainage and  no evidence of infection. On the left lower extremity, this wound also  has decreased in volume with healthy-appearing granulation. There is no  evidence of ischemia or infection.   ASSESSMENT:  Clinical improvement with edema control.   PLAN:  We will continue the Unna wrap on the left lower extremity.  We  will continue bilateral venous pumps at 50 mmHg max for 1 hour twice a  day.  We will reevaluate the patient in 1 week.      Harold A. Tanda Rockers, M.D.  Electronically Signed     HAN/MEDQ  D:  09/25/2007  T:  09/26/2007  Job:  [id number]

## 2011-04-25 NOTE — Assessment & Plan Note (Signed)
[facility]   NAME:  [name], [name]               ACCOUNT NO.:  [id number]   MEDICAL RECORD NO.:  [id number]      DATE OF BIRTH:  08/18/1941   PHYSICIAN:  Jonelle Sports. Sevier, M.D.  VISIT DATE:  02/26/2008                                   OFFICE VISIT   HISTORY:  This 66-year white male is followed for a venostasis  ulceration on the left lateral lower leg which has been present for  several years but has made dramatic progress since under the care of  this clinic.   His most recent management has consisted of Silverlon patch over the one  remaining open wound covered by an Unna wrap.   He reports no problems in the interim week to suggest deterioration in  this wound whatsoever.   PHYSICAL EXAMINATION:  VITALS:  Blood pressure 151/78, pulse 90,  respirations 20, temperature 97.3. The area of ulceration on the left  lateral lower leg now measures 0.3 x 0.3 x 0.1 cm and has a clean base.  There is some crust over the adjacent area where the wound has most  recently healed.   IMPRESSION:  Satisfactory progress venostasis ulceration left lower  extremity.   DISPOSITION:  The crust over the recently healed portion of the wound is  very cautiously selectively debrided away without difficulty.   The wound is then covered with an application of Silverlon and that  extremity returned to an Unna wrap.   Follow-up visit will be here in 1 week at which time quite possibly he  will be fully healed.           ______________________________  Jonelle Sports Cheryll Cockayne, M.D.     RES/MEDQ  D:  02/26/2008  T:  02/27/2008  Job:  [id number]

## 2011-04-25 NOTE — Assessment & Plan Note (Signed)
[facility]   NAME:  [name], [name]               ACCOUNT NO.:  [id number]   MEDICAL RECORD NO.:  [id number]      DATE OF BIRTH:  03-Feb-1941   PHYSICIAN:  Jonelle Sports. Sevier, M.D.  VISIT DATE:  11/25/2008                                   OFFICE VISIT   HISTORY:  This 70 year old white male is followed for multiple stasis  ulcerations around the ankles bilaterally and a larger ulcer more  proximally on the lateral aspect of the left lower extremity.  He has  been treated most recently with applications of Puracol AG and with  bilateral Profore wraps.  He has made gradual progress, but remains  unhealed.   Since his last visit, he reports no problems with increased drainage,  fever, odor, or any other systemic symptoms.   PHYSICAL EXAMINATION:  VITAL SIGNS:  Blood pressure 162/79, pulse 95,  respirations 20, and temperature 97.7.   His multiple wounds are described in the nurse's notes with dimensions,  and so forth all of these appear chronic, essentially all have still  some slough in their bases and the more proximal wounds laterally on the  distal left lower extremity is filled with hypergranulation.   IMPRESSION:  Multiple stasis ulcerations bilateral lower extremities,  slowly improving.   DISPOSITION:  All wounds are sharply debrided selectively so of their  slough and the larger lateral wound as described is debrided of its  hypergranulation.  This produces considerable bleeding, but it is easily  controlled with pressure.   Once hemostasis is obtained, the wounds were dressed with application of  Puracol AG to each covered with soft sole pads and both extremities are  placed in Profore wraps.   The patient will be seen for dressing changes by the nurse in 1 and 2  weeks and by the physician again in 3 weeks.  This in keeping with the  compressed schedule for the holiday season.   Obviously, should the nurse in her discretion feel the patient needs  to  be seen by a physician at any one of those intervals, she will so  arrange.           ______________________________  Jonelle Sports. Cheryll Cockayne, M.D.     RES/MEDQ  D:  11/25/2008  T:  11/26/2008  Job:  [id number]

## 2011-04-25 NOTE — Assessment & Plan Note (Signed)
[facility]   NAME:  PATON, CRUM NO.:  [id number]   MEDICAL RECORD NO.:  [id number]      DATE OF BIRTH:  06/08/1941   PHYSICIAN:  Maxwell Caul, M.D.      VISIT DATE:                                   OFFICE VISIT   LOCATION:  [facility].   Mr. Parkerson is a 70 year old man with chronic and recurrent venous stasis  with inflammation and ulceration.  He returns today for review of wounds  on his left lateral lower extremity in the calf area.  He has also last  week developed a wound on the right medial ankle.  He talks today about  pain in his legs which I think is related to degenerative disk disease,  also probably peripheral neuropathy (he is on Neurontin).  We have been  applying Puracol Ag, hydrogel, dry dressing, and an Unna boot wrap to  his bilateral lower extremities and he is tolerating this reasonably  well.   On examination, the 2 areas on the left lateral leg both have clean  granulating base as these were not debrided.  They appear to be making  some progression in towards healing.  The area on the right medial ankle  also appears to be granulated and epithelializing.   IMPRESSION:  Improved bilateral venous stasis disease.   We continued the Puracol Ag, hydrogel with an Unna boot wrap.  We will  see him again next week.           ______________________________  Maxwell Caul, M.D.     MGR/MEDQ  D:  06/03/2009  T:  06/04/2009  Job:  [id number]

## 2011-04-25 NOTE — Assessment & Plan Note (Signed)
[facility]   NAME:  LISA, BLAKEMAN               ACCOUNT NO.:  [id number]   MEDICAL RECORD NO.:  [id number]      DATE OF BIRTH:  Jan 10, 1941   PHYSICIAN:  Theresia Majors. Tanda Rockers, M.D. VISIT DATE:  01/29/2008                                   OFFICE VISIT   SUBJECTIVE:  Mr. Rudman is a 70 year old man who we followed for  bilateral stasis ulcers.  We have treated him with compression wraps and  sequential pumps.  He returns for followup. There has been no excessive  drainage, malodor, pain, or fever.   OBJECTIVE:  Blood pressure 134/82.  Respirations  18.  Pulse rate 82.  Temperature 97.6.  Capillary blood glucose 148 mg percent.  Inspection of the lower extremities shows that the wounds show continued  decrease in area.  There has been a reactivation of the ulcer the medial  aspect of the right lower extremity  but there is no evidence of  ascending infection or ischemia.  The wound of the left lower extremity  continues to decrease in size with no drainage whatsoever.   ASSESSMENT:  Clinical improvement.  Adequate control with external  compression.   PLAN:  We will return the patient to Unna wraps with triamcinolone  ointment. We will reevaluate him in 1 weeks.      Harold A. Tanda Rockers, M.D.  Electronically Signed     HAN/MEDQ  D:  01/29/2008  T:  01/30/2008  Job:  [id number]

## 2011-04-25 NOTE — Assessment & Plan Note (Signed)
[facility]   NAME:  [name], [name]               ACCOUNT NO.:  [id number]   MEDICAL RECORD NO.:  [id number]      DATE OF BIRTH:  28-Mar-1941   PHYSICIAN:  Theresia Majors. Tanda Rockers, M.D. VISIT DATE:  07/12/2007                                   OFFICE VISIT   SUBJECTIVE:  [name] for follow up of bilateral stasis ulcers.  In the  interim, he denies excessive drainage, malodor, pain or fever.  He  continues to be ambulatory with his walker.   OBJECTIVE:  VITAL SIGNS:  Blood pressure is 152/78, respirations 20,  pulse rate 86, temperature 97.7.  Capillary blood glucose is 133 mg%.  EXTREMITIES:  Inspection of the lower extremities shows that there has  been some increase in the ulceration at the medial aspect of the medial  malleolus of the right ankle.  Wounds of the left lower extremity show  decreasing area with advance in epithelium.  The right distal medial leg  has a small new area of flare-up.  There is associated 2+ bilateral  edema with chronic changes of stasis.  There is no evidence of arterial  compromise.  There is no evidence of ascending infection or abscess  formation.   ASSESSMENT:  Slightly improving stasis ulcers with controlled fluid.   PLAN:  We will continue the Unna boot protocol.  We will reassign the  patient to the nurse-only clinic with a physician to reevaluate him in 1  month.  The nurse will continue to see him weekly for wraps.  We have  explained this approach to the patient in terms that he seems to  understand.  He finds these arrangements acceptable.      Harold A. Tanda Rockers, M.D.  Electronically Signed     HAN/MEDQ  D:  07/29/2007  T:  07/29/2007  Job:  [id number]

## 2011-04-25 NOTE — Assessment & Plan Note (Signed)
[facility]   NAME:  [name], [name]               ACCOUNT NO.:  [id number]   MEDICAL RECORD NO.:  [id number]      DATE OF BIRTH:  [birth year]   PHYSICIAN:  Maxwell Caul, M.D.      VISIT DATE:                                   OFFICE VISIT   HISTORY OF PRESENT ILLNESS:  [name] is here having been seen on  Friday.  At that point in time, the only wounds present were on his  right medial ankle over the medial malleolus.  The wounds on the left  leg had completely healed.  We did not wrap the left leg, gave him  instructions to use the external graded pressure pump he has at home  twice a day as prophylaxis for edema in his setting of recurrent chronic  venous stasis.  He returns today unfortunately developing a marked  increase in the edema of the left leg.  This resulted in some weeping,  therefore he has not used the external compression device since Saturday  morning.  He has not had any fever.  No undue complaints of pain;  however, he has developed an open wound on the lateral aspect of the  left leg.   PHYSICAL EXAMINATION:  VITAL SIGNS: Temperature is 98.2, pulse 91,  respiration is 22, and blood pressure 141/87.  EXTREMITIES: The area over the right medial ankle actually continues to  improve and I think all of these are in a state of full granulation and  advancing epithelialization.  Unfortunately, the left lateral lower  extremity has 3 areas added together to measure 7.5 x 8 x 0.1.  There is  no evidence of surrounding infection.  The leg itself is very enlarged,  however, it is not warm and tender.  There is no evidence of cellulitis  or a DVT.  Dopplered pulse in the left leg seems to have monophasic  signals.  His previous ABIs done last year were over 1.  His capillary  refill time is brisk, therefore, I really do not think there is ongoing  ischemia here.   IMPRESSION:  New ulcerations on the left leg in the setting of severe  bilateral  venous stasis.  Unfortunately, he did not use the graded  pressure pump.  He was also I think on his feet a lot more over the  weekend and previously.  This has resulted in breakdown of the left leg.  I placed Silverlon on these wounds and wrapped in an Unna.  With regards  to the wounds on the right leg also in the setting of venous stasis, I  think these are progressively looking better and I think they will  probably close over perhaps by the time we see him in the next week but  a maximum of 2 weeks.  He will be seen next week in the nurses clinic  and will be seen by the physician in 2 weeks' time.           ______________________________  Maxwell Caul, M.D.     MGR/MEDQ  D:  06/10/2008  T:  06/11/2008  Job:  [id number]

## 2011-04-25 NOTE — Assessment & Plan Note (Signed)
[facility]   NAME:  [name], [name]               ACCOUNT NO.:  [id number]   MEDICAL RECORD NO.:  [id number]      DATE OF BIRTH:  12/05/1941   PHYSICIAN:  Jonelle Sports. Sevier, M.D.  VISIT DATE:  03/31/2009                                   OFFICE VISIT   HISTORY:  This 70 year old white male has been followed for rather  refractory venous stasis ulcerations on the distal posterior aspect of  his left lower extremity.  These have been treated most recently with  debridement and the use of silver alginate and they have shown some  progressive reduction in size.   He returns today still in the midst of his neurologic workup for his  painful diabetic neuropathy, but otherwise with no new complaints.   PHYSICAL EXAMINATION:  Blood pressure is 129/87, pulse 87, respirations  18, and temperature 97.3.  The 2 wounds that he has in conglomerate  measured 2.5 x 1.0 x 0.1 cm and have considerable slough and if not  biofilm in the wound base.   IMPRESSION:  Gradual reduction in chronic venous stasis ulcerations  despite apparent biofilm present.   The patient's wounds today are debrided sharply of the slough and  possible biofilm that is present.  This seems more easily accomplished  than previously.   My plan will be to dress the wounds with silver alginate covered by  SofSorb pad and to return that extremity to Profore wrap.   Incidentally, he has a small lesion that appears to be tinea circinata  on the medial proximal aspect of that same leg and we have been simply  putting ketoconazole cream on that and each time we have changed his  dressing and it seems to be some progressive improvement.  Accordingly,  we will do that again at this time that is to put ketoconazole cream to  the area, apparently tinea circinata.   Follow up visit will be here in 1 week.           ______________________________  Jonelle Sports. Cheryll Cockayne, M.D.     RES/MEDQ  D:  03/31/2009  T:   04/01/2009  Job:  [id number]

## 2011-04-25 NOTE — Assessment & Plan Note (Signed)
[facility]   NAME:  FREEMON, BINFORD               ACCOUNT NO.:  [id number]   MEDICAL RECORD NO.:  [id number]      DATE OF BIRTH:  [birth year]   PHYSICIAN:  Theresia Majors. Tanda Rockers, M.D. VISIT DATE:  12/30/2007                                   OFFICE VISIT   SUBJECTIVE:  Mr. Lacko is a 70 year old man who we are following for  bilateral stasis ulcers.  He is being treated with a venous leg pump as  well as bilateral Unna boots.  There has been no interim excessive  drainage, malodor, pain or fever.   OBJECTIVE:  Blood pressure is 150/79, respirations 18, pulse rate 87,  temperature 98.2.  Capillary blood glucose is 156 milligram percent.  Inspection of the lower extremity shows that there is excellent control  of edema on the right lower extremity.  The ulcer is completely  resolved.  Skin moisture is normal.  On the left lower extremity the  wounds continue to decrease in size with advance in epithelium.  There  is no evidence of infection, capillary refill is brisk, pedal pulses are  faintly palpable.   ASSESSMENT:  Clinical improvement of stasis ulcers.   PLAN:  We will continue the Unna boot, triamcinolone ointment, and the  venous leg pump.  We will reevaluate the patient in 1 week.      Harold A. Tanda Rockers, M.D.  Electronically Signed     HAN/MEDQ  D:  12/30/2007  T:  12/30/2007  Job:  [id number]

## 2011-04-25 NOTE — Assessment & Plan Note (Signed)
[facility]   NAME:  [name], [name]               ACCOUNT NO.:  [id number]   MEDICAL RECORD NO.:  [id number]      DATE OF BIRTH:  Aug 02, 1941   PHYSICIAN:  Leonie Man, M.D.    VISIT DATE:  07/19/2009                                   OFFICE VISIT   PROBLEM:  Bilateral venous stasis disease with bilateral venous leg  ulcers.  On the left leg lateral and superior, is a 1 x 2.3 x 0.1 venous  leg ulcer.  On the left leg on the lower portion of the leg inferior to  the first ulcer is a 0.3 x 0.2 x 0.1 ulcer.   On the right medial ankle, there is a confluent ulcer measured in 2  areas at 1.5 x 0.7 x 0.1 and 0.8 x 0.2 x 0.1.   The patient has been treated over the past few weeks with Puracol,  hydrogel, Adaptic, and Unna boots with continued success.  He has also  been on doxycycline for an intercurrent Staph aureus infection, which is  quite sensitive.   On examination today, appearance, the patient is in no distress and not  complaining of any specific pain.  His temperature is 98.0, pulse 96,  respirations 19, blood pressure 124/81, and his capillary blood glucose  is 97.   On the right leg, the wound edges are jagged; however, the surrounding  skin appears quite normal without any erythema or edema.  The base of  the wound is cleanly granulating with some beginnings of  epithelialization that is seen there.   On the left leg, the upper wound is granulating well and the lower wound  is just about to close at 0.8 x 0.2 x 0.1 cm.   TREATMENT:  Today, Puracol, hydrogel with Adaptic cover, and  Unna paste  boots.   DISPOSITION:  Follow up with this gentleman in 1 week.      Leonie Man, M.D.  Electronically Signed     PB/MEDQ  D:  07/19/2009  T:  07/20/2009  Job:  [id number]

## 2011-04-25 NOTE — Assessment & Plan Note (Signed)
[facility]   NAME:  [name], [name]               ACCOUNT NO.:  [id number]   MEDICAL RECORD NO.:  [id number]      DATE OF BIRTH:  02-Sep-1941   PHYSICIAN:  Maxwell Caul, M.D. VISIT DATE:  07/23/2007                                   OFFICE VISIT   Purpose today's visit is followup of bilateral venous stasis ulceration  predominately involving the left lateral leg.  He has most recently  had  Iodosorb applied to his two major remaining wounds.  He was wrapped with  an Unna wraps which he has tolerated well.  He denies any excessive  drainage, pain or malodor.   WOUND EXAM:  Temperature 97.4, pulse 87, respirations 20, blood pressure  156/79, CBG 148.  He has two major remaining wounds.  The major one is  on the left lower leg which we have previously defined as wound #1.  This measures 5.4 x 1.1 x 0.1.  This has good granulation and at the  beginning attempted epithelialization.  The wound is moist, perhaps  excessively so.  There is no evidence of infection.  The right medial  area just above the medial malleolus is what we have defined as #4.  This measures 1.3 x 0.3 x 0.1.  I think this is contracting  satisfactorily and it appears to be improved.   WOUND CARE PLAN AND FOLLOWUP:  I have continued with the Iodosorb and  the Unna wraps.  We have applied TCA to his dry flaky skin.  We will see  him again in a week's time.           ______________________________  Maxwell Caul, M.D.     MGR/MEDQ  D:  07/23/2007  T:  07/24/2007  Job:  [id number]

## 2011-04-25 NOTE — Assessment & Plan Note (Signed)
[facility]   NAME:  [name], [name]               ACCOUNT NO.:  [id number]   MEDICAL RECORD NO.:  [id number]      DATE OF BIRTH:  Jun 16, 1941   PHYSICIAN:  Jonelle Sports. Sevier, M.D.  VISIT DATE:  04/07/2009                                   OFFICE VISIT   HISTORY:  This 70 year old white male with diabetes and severe  neuropathy as well as chronic venous disease is followed for refractory  stasis ulcerations on the lateral aspect of his left ankle.   These have been troublesome and that they tend to accumulate very heavy  slough and probable biofilm and have been very refractory toward making  any significant progress.   His blood pressure today is 152/81, pulse 91, respirations 18, and  temperature 98.8.  His blood glucose at home today was 162 mg/dL.  On  the lateral aspect of the distal left calf, are 2 ulcers with a total  affected area measuring approximately 3.0 x 2.0 x 0.1 cm.  Again, the  base of these ulcers appears to be filled with a fairly significant semi-  adherent slough.  There is no significant odor.   IMPRESSION:  Very little recent progress in chronic ulcers, left lower  extremity.   DISPOSITION:  The wounds were debrided of this slough using a scalpel  and is actually not particularly adherent, but comes off fairly easily.  The patient is able to tolerate this without anesthesia due to the  profundity of his neuropathy.  Once the wounds are clean, they are  dressed with an application of silver alginate, covered with a SofSorb  pad, and that extremity is placed into a Profore wrap.   Follow up visit will be here in 1 week.           ______________________________  Jonelle Sports. Cheryll Cockayne, M.D.     RES/MEDQ  D:  04/07/2009  T:  04/08/2009  Job:  [id number]

## 2011-04-25 NOTE — Assessment & Plan Note (Signed)
[facility]   NAME:  BROUGHTON, EPPINGER               ACCOUNT NO.:  [id number]   MEDICAL RECORD NO.:  [id number]      DATE OF BIRTH:  [birth year]   PHYSICIAN:  Leonie Man, M.D.    VISIT DATE:  01/13/2009                                   OFFICE VISIT   PROBLEM:  Venous stasis disease bilaterally with lateral malleolar and  lateral calf ulcer on the left leg with current dimensions of 8 cm x 4.5  cm x 0.2 cm deep.  On the medial left ankle, there is a small ulcer  measuring 0.9 x 0.5 x 0.1 cm.  There is a previous ulcer on the left  medial ankle, which is now closed.  The patient is here for wound  evaluation and dressing change today.  He is feeling generally well  without any complaints of any wound, pain, fever, chills, or odor.  The  patient does have low back pain syndrome, which cause him continued  pain.   On examination of the wound, he has on the left lateral leg significant  granulation.  There are several areas of fibrinous exudates and  necrosis, which I went ahead and did a selective debridement down to the  clean granulation tissues.  The wound on the medial aspect of his left  leg shows good granulations and did not need any debridement.  The edema  is still present, but much reduced from his last visit.   ASSESSMENT:  Continued healing of this venous stasis ulcer on Mr. Danek.  We plan to go ahead and on lateral leg, give him silver alginate Profore  dressing, which we will change weekly.  The medial ankle, dressed with  collagen, hydrogel, and Profore, which also will be exchanged weekly.  We will follow up with him in 1 week.      Leonie Man, M.D.  Electronically Signed     PB/MEDQ  D:  01/13/2009  T:  01/14/2009  Job:  [id number]

## 2011-04-25 NOTE — Assessment & Plan Note (Signed)
[facility]   NAME:  DONAVON, KIMREY               ACCOUNT NO.:  [id number]   MEDICAL RECORD NO.:  [id number]      DATE OF BIRTH:  January 11, 1941   PHYSICIAN:  Maxwell Caul, M.D. VISIT DATE:  06/05/2008                                   OFFICE VISIT   Mr. Venn is a gentleman we have been following here for stasis ulcers  for a long period of time now.  He has been treating with bilateral Unna  boots.  He has sequential external leg pumps at home although I do not  think he has been using them with any regularity.  He continues to be  ambulatory.  He has complaints of pain in his right anterior thigh which  he has explored with his primary doctor and thinks this is probably  radicular pain.  However, he does not complain of pain specifically in  his lower legs.   On examination, he is afebrile.  Both his pulses are 100, blood pressure  157/76.  He does have small wounds today on the right medial ankle just  around the medial malleolus.  There are several small areas that are  open in various stages of re-epithelialization.  He is concerned about  some swelling just at the top of the wrap on the left leg, although this  is not particularly tender and I do not believe there is any abscess  here and it is simply wrap irritation.   IMPRESSION:  Severe venous stasis disease.  The only open areas he has  are on the right medial leg.  I am going to rewrap them in an Unna.  I  am going to leave the left leg unwrapped with instructions for him to  use his sequential circulator in the morning and at bedtime.  We will re-  review him again in 2 weeks and make a decision on whether we can move  to the full use of the external circulator versus continued compression  wraps.           ______________________________  Maxwell Caul, M.D.     MGR/MEDQ  D:  06/05/2008  T:  06/06/2008  Job:  [id number]

## 2011-04-25 NOTE — Assessment & Plan Note (Signed)
[facility]   NAME:  JERID, CATHERMAN              ACCOUNT NO.:  [id number]   MEDICAL RECORD NO.:  [id number]            DATE OF BIRTH:   PHYSICIAN:  Theresia Majors. Tanda Rockers, M.D. VISIT DATE:  12/23/2007                                   OFFICE VISIT   SUBJECTIVE:  The patient is a 70 year old man we have followed for  bilateral stasis ulcers.  In the interim, he has continued to use his  sequential leg pump. There has been no fever. Continues to be  ambulatory.   OBJECTIVE:  VITAL SIGNS:  Blood pressure 132/77, respiration rate 20,  pulse rate 86, temperature 98.2.  LOWER EXTREMITIES :  Inspection of the lower extremities shows that the  ulcers, themselves, have decreased. They continued to be clean with 100%  granulation. His edema is judged at 1-2+  bilaterally. There is no  evidence of ischemia.   ASSESSMENT:  Clinical improvement.   PLAN:  We will continue the wound wraps. Will continue the sequential  leg pump and re-evaluate the patient in one week.      Harold A. Tanda Rockers, M.D.     [name]  D:  12/23/2007  T:  12/24/2007  Job:  [id number]

## 2011-04-25 NOTE — Assessment & Plan Note (Signed)
[facility]   NAME:  FRANKLIN, BAUMBACH               ACCOUNT NO.:  [id number]   MEDICAL RECORD NO.:  [id number]      DATE OF BIRTH:  12-Jan-1941   PHYSICIAN:  Jonelle Sports. Sevier, M.D.  VISIT DATE:  06/06/2007                                   OFFICE VISIT   HISTORY:  This 70 year old white male is followed with bilateral  varicosities with venous hypertension and bilateral ulcerations.  He was  last seen here a week ago and was treated with continuation of Unna  wraps which he had been in for some time.  The patient reports that his  legs have progressively improved over the time of his treatment here  which compares with 15 previous years of non-healing.   He specifically reports that during the interim week, he has had no  intercurrent illness.  He has noticed no increase in swelling.  No  drainage, no fever or systemic symptoms and no significant pain.   PHYSICAL EXAMINATION:  VITAL SIGNS:  Blood pressure 136/79, heart rate  95, respirations 18, temperature 97.5.  There are indeed active ulcerations on both lower extremities.  On the  right this is on the medial distal area, proximal to the ankle but not  truly in the gaiter location.  This ulcer measures 2.0 x 1.0 x 0.1 cm  and this represents a modest improvement from the measurements of 1 week  earlier.  There is 1 tiny satellite lesion on that leg as well.  That  leg is not particularly swollen and there is no significant  inflammation.   On the left lower extremity the principal ulceration is distal and  lateral and it measures there 5.5 x 2.4 x 0.1 cm again somewhat smaller  than it was a week ago.  This extremity is involved with considerable  edema from the knee down and there is mild chronic background  inflammatory appearance to the skin.  There is also some significant  desquamation of dry skin there.   IMPRESSION:  Both wounds are improved.   DISPOSITION:  The wound on the left lower extremity is  debrided of the  desquamating skin and some slough from the base of the wound and at the  wound margins.  The wound base is reasonable granular, adequately moist  and free of obvious infection.   Both extremities are returned to [employer].   Followup visit will be here in 10 days.           ______________________________  Jonelle Sports Cheryll Cockayne, M.D.     RES/MEDQ  D:  06/06/2007  T:  06/06/2007  Job:  [id number]

## 2011-04-25 NOTE — Assessment & Plan Note (Signed)
[facility]   NAME:  [name], [name]               ACCOUNT NO.:  [id number]   MEDICAL RECORD NO.:  [id number]      DATE OF BIRTH:  05-07-1941   PHYSICIAN:  Theresia Majors. Tanda Rockers, M.D. VISIT DATE:  11/19/2007                                   OFFICE VISIT   SUBJECTIVE:  Mr. Thwaites returns for follow-up of recalcitrant stasis  ulcers related to fluid retention in his lower extremities. In the  interim he has been treated with Unna wraps as well as sequential venous  leg pumps.  There has been no interim fever, excessive drainage or  malodor.   OBJECTIVE:  Blood pressure is 141/84, respirations 20, pulse of 84,  temperature is 97.6.  Inspection of the lower extremity shows that there is considerable  decrease in edema.  He now has 2+ edema with no evidence of  inflammation.  The wounds on the left lateral leg and the left great toe  have decreased significantly; wound #7 has resolved; wound #1 has shown  considerable decrease. On the right leg there is a dry eschar. The edema  is noted above.   ASSESSMENT:  Clinical response to adequate edema control.   PLAN:  We will resume the Unna wraps bilaterally as well as the  sequential leg pump.  We will reevaluate the patient in 1 week.      Harold A. Tanda Rockers, M.D.  Electronically Signed     HAN/MEDQ  D:  11/19/2007  T:  11/19/2007  Job:  [id number]

## 2011-04-25 NOTE — Assessment & Plan Note (Signed)
[facility]   NAME:  [name], [name]               ACCOUNT NO.:  [id number]   MEDICAL RECORD NO.:  [id number]      DATE OF BIRTH:  1941-11-11   PHYSICIAN:  Jonelle Sports. Sevier, M.D.  VISIT DATE:  12/15/2008                                   OFFICE VISIT   HISTORY:  This 70 year old white male diabetic who has been followed for  fairly refractory stasis ulcerations on the medial malleolar areas of  both lower extremities.  We have gotten these healed at one point and  placed him in compression stockings, but apparently some trauma  associated with putting those on in the right lower extremity and a  reopened an ulcer in the medial malleolar area there.  We have been  treating that most recently with Profore wraps.   He returns today saying that he feels the wound has gotten little bit  larger again and this is because for several weeks through the holiday  season.  He has been under home health care rather than our care and has  accumulated to gain more edema presumably due to inadequate compressive  wraps.   PHYSICAL EXAMINATION:  VITAL SIGNS:  Blood pressure 145/79, pulse 96,  respirations 20, and temperature 97.6.   Glucose 126.  His left ankle remains healed, but there are several  vulnerable spots on that extremity.  His right medial malleolar area has  an ulcer measuring 2.8 x 1.0 x 0.2 cm with considerable slough in its  base.   IMPRESSION:  Stalled progress, right medial malleolar ulcer.   DISPOSITION:  The wound is debrided of the slough as indicated above.   Following debridement, the wound is cultured.   The wound was then dressed with an application of silver alginate and  that extremity placed in a Profore wrap.   The left lower extremity will be placed in an Unna wrap.   These slings will be changed in 1 week by [facility] and the patient  will be seen here again in 2 weeks.           ______________________________  Jonelle Sports Cheryll Cockayne,  M.D.     RES/MEDQ  D:  12/15/2009  T:  12/16/2009  Job:  [id number]

## 2011-04-25 NOTE — Assessment & Plan Note (Signed)
[facility]   NAME:  [name], [name]               ACCOUNT NO.:  [id number]   MEDICAL RECORD NO.:  [id number]      DATE OF BIRTH:  02-Dec-1941   PHYSICIAN:  Jonelle Sports. Sevier, M.D.  VISIT DATE:  09/23/2008                                   OFFICE VISIT   HISTORY:  This 70 year old white male with chronic venous insufficiency  with varicosities and venous stasis ulcerations are seen in followup for  multiple ulcerations involving the medial and lateral aspects of his  left lower extremity, medially near the malleolar area and laterally is  somewhat proximal to this as well as down in the malleolar area and also  on the lateral left heel.  These were treated at his last visit a week  ago with application of Puracol Ag, ABD for absorption, and a Profore  wrap.   He reports since that visit, he has had a fair amount of drainage, but  has been unaware of any increased odor, pain, fever, or systemic  symptoms.   A culture done last visit grew some Enterobacter cloacae, and he was  begun several days ago by Dr. Leanord Hawking on Cipro for this.  That will  continue for a total of 1 week.   PHYSICAL EXAMINATION:  Blood pressure 132/85, pulse 99, respirations 22,  temperature 97.7, and blood glucose random 152 mg/dL.   The wounds are consolidated into 4 wound areas and the measurements of  these are recorded in the nursing assessment in the chart, will not be  repeated in detail here.  Basically, all are either essentially where  they were before and slightly smaller.   All the wounds seem to be covered with a bit of biofilm and slough, but  the periwound areas are free of inflammation, and otherwise, look pretty  good.   IMPRESSION:  Slow but slightly progressing improvement in chronic venous  ulcerations of the left lower extremity.   DISPOSITION:  All the wounds are debrided through the use of a curette.  The medial wound is found to have really very little biofilm, but  some  slough present and this is easily removed with a curette.   The lateral wounds all have a significant biofilm present and all of  these are easily debrided and with good tolerance by the patient again  with a curette.   Following debridement, hemostasis were appropriate.  The wounds were  treated again with an application of Puracol Ag to each ABD pads for  absorptive purposes and a Profore wrap.   The patient will be seen in 1 week for dressing change by the nurse and  in 2 weeks by the physician.           ______________________________  Jonelle Sports. Cheryll Cockayne, M.D.     RES/MEDQ  D:  09/23/2008  T:  09/24/2008  Job:  [id number]

## 2011-04-25 NOTE — Assessment & Plan Note (Signed)
[facility]   NAME:  [name], [name]               ACCOUNT NO.:  [id number]   MEDICAL RECORD NO.:  [id number]      DATE OF BIRTH:  Oct 25, 1941   PHYSICIAN:  Jonelle Sports. Sevier, M.D.  VISIT DATE:  01/27/2009                                   OFFICE VISIT   HISTORY:  This 70 year old white male has been followed for stasis  ulcerations of both lower extremities.  The right lower extremity has  been healed now for some time and he has been in Tubigrip there.  He  still has one small lesion on the medial aspect of his left ankle and  larger bipartite area of ulceration laterally proximal to that ankle.  He reports a lot of drainage from the lateral lesions during this past  week following their debridement and their treatment with application of  silver alginate.  He has had no fever, chills, or systemic symptoms.  He  has noticed no particular odor.   PHYSICAL EXAMINATION:  VITAL SIGNS:  Blood pressure 134/83, pulse 87,  respirations 16, temperature 98.3, random capillary blood glucose 132  mg/dL.  SKIN:  The medial wound is slightly encrusted, but appears  closed.  The left lateral wound is again completely involved in a  fibrinous film-like exudate throughout the entirety of the wound with  some softer exudative material just at some of the wound margins.   IMPRESSION:  Chronic stasis ulcerations, left lower extremity with  somewhat stalled at the moment.   DISPOSITION:  The medial wound is relieved of its crusts with sharp  debridement and indeed the wound there is closed.  He will be treated  however for safety sake with an application of hydrogel and a piece of  Silverlon.  The lateral wounds are as described and rather than  aggressively debride as has been done so many times before with  extensive bleeding in this man, I have elected today simply to debride  the softer aspects of the exudate at the wound margins and to go to the  use of Santyl for his other  debridement purposes.  Once the Santyl is  applied, the wound will be covered with alginate, soft sore pad, and he  will be placed in a Profore wrap on that extremity.   We will have him return in 5 days for redressing by the nurse and in 9  days to be seen by the MD.           ______________________________  Jonelle Sports. Cheryll Cockayne, M.D.     RES/MEDQ  D:  01/27/2009  T:  01/28/2009  Job:  [phone]

## 2011-04-25 NOTE — Assessment & Plan Note (Signed)
[facility]   NAME:  [name], CORP               ACCOUNT NO.:  [id number]   MEDICAL RECORD NO.:  [id number]      DATE OF BIRTH:  11/27/1941   PHYSICIAN:  Theresia Majors. Tanda Rockers, M.D. VISIT DATE:  11/12/2007                                   OFFICE VISIT   SUBJECTIVE:  [name] is a 70 year old man whom we are treating for  bilateral stasis ulcerations.  He continues to use the Unna wraps as  well as the venous leg pumps.  He has been moderately active over the  Thanksgiving holidays and reports that there has been more swelling in  his lower extremities.  There has been an increase in drainage but no  fever and no pain.   OBJECTIVE:  Blood pressure is 146/92, respirations 16, pulse rate 92,  temperature 97.5, capillary blood glucose is 138 mg percent.  Inspection of the lower extremity shows a persistence of 2 to 3+ edema.  Wound #7 on the left lateral leg has increased slightly in area.  Wound  number 2C on the right medial distal lower leg remains, 100%  epithelialized with a fragile covering of cells.  Wound #7 is resolved  with fragile 100% epithelium.  Please refer to the data entries and  photographs.   ASSESSMENT:  Persistent stasis ulceration left lateral leg with  persistent edema bilaterally.   PLAN:  We are recommending the patient return to bilateral Unna boots.  We will reevaluate him in 1 week p.r.n.      Harold A. Tanda Rockers, M.D.  Electronically Signed     HAN/MEDQ  D:  11/12/2007  T:  11/12/2007  Job:  [id number]

## 2011-04-25 NOTE — Assessment & Plan Note (Signed)
[facility]   NAME:  [name], [name]               ACCOUNT NO.:  [id number]   MEDICAL RECORD NO.:  [id number]      DATE OF BIRTH:  1941/05/18   PHYSICIAN:  Jonelle Sports. Sevier, M.D.  VISIT DATE:  03/18/2008                                   OFFICE VISIT   HISTORY:  This 70 year old white male with chronic venous insufficiency  and lymphedema has been followed for bilateral lower extremity  ulcerations.   He has improved considerably over recent weeks with our therapies and  presently has on one open lesion remaining on the right lower extremity.  For the past week, he has been Unna wrap and has used his compressive  pump boots at home.  We had removed the wrap from the right lower  extremity, but it has swollen more in the absence of the wrap the during  the interim week.   He reports no significant pain, no drainage, no odor and no fever or  systemic symptoms.   Blood pressure 151/76, pulse 94, respirations 18, temperature 97.6.   The previously open area on the left lateral lower extremity distally is  scabbed over now and does not appear to represent an open lesion.   There is on the right lower extremity at the medial malleolar area,  however after removal of crust, a small wound persisting that measures  0.3 x 0.2 x 0.1 cm.  It has a fairly clean base.   IMPRESSION:  Continued improvement bilateral venous stasis disease with  ulcerations.   DISPOSITION:  The patient today will be placed back in Unna wraps  bilaterally and, in addition, will be instructed to continue his venous  pump therapy twice daily at home as he has been doing in the past.   He will be seen here again in 1 week for dressing change by the nurses  and in 2 weeks by the physician.           ______________________________  Jonelle Sports. Cheryll Cockayne, M.D.     RES/MEDQ  D:  03/18/2008  T:  03/18/2008  Job:  [id number]

## 2011-04-25 NOTE — Assessment & Plan Note (Signed)
[facility]   NAME:  [name], [name]               ACCOUNT NO.:  [id number]   MEDICAL RECORD NO.:  [id number]      DATE OF BIRTH:  [birth year]   PHYSICIAN:  Theresia Majors. Tanda Rockers, M.D. VISIT DATE:  01/14/2008                                   OFFICE VISIT   SUBJECTIVE:  [name] is a 70 year old man who we have followed for  bilateral stasis.  The patient continues to use the sequential leg  pumps.  We have treated his left lower extremity with an Unna wrap.  He  reports that there has been no drainage, no malodor and no pain.  He  continues to be ambulatory.   OBJECTIVE:  Blood pressure is 159/86, respirations 20, pulse rate 86,  temperature 97.  Capillary blood glucose is 141%.  Inspection of the lower extremities discloses that there is adequate  control of edema bilaterally.  The stasis ulcers on the left lateral leg  all have decreased.  They are healthy, granulating wounds with advancing  epithelium.  Capillary refill is brisk.  There is no evidence of  concurrent ischemia, infection, lymphangitis or abscess.   ASSESSMENT:  Improved stasis ulcer.   PLAN:  Continue compression wrap as well as the sequential leg pumps.  We will reevaluate the patient in 1 week.      Harold A. Tanda Rockers, M.D.  Electronically Signed     HAN/MEDQ  D:  01/14/2008  T:  01/15/2008  Job:  [id number]

## 2011-04-25 NOTE — Assessment & Plan Note (Signed)
[facility]   NAME:  [name], [name]               ACCOUNT NO.:  [id number]   MEDICAL RECORD NO.:  [id number]      DATE OF BIRTH:  [birth year]   PHYSICIAN:  Jonelle Sports. Sevier, M.D.  VISIT DATE:  02/19/2008                                   OFFICE VISIT   HISTORY:  The 70 year old white male has been followed with stasis  dermatitis and ulceration of both lower extremities.  When he was seen a  week ago, the right lower extremity had completely healed, but there was  one unhealed but improved ulcer on the anterolateral aspect of the mid  left pretibial area.  Accordingly both extremities were returned to Unna  wraps with the wound on the left having a silver line pad placed on it  first and with a protective foam pad having been placed on the medial  malleolar and supramalleolar area of that leg to protect beneath the  Profore wrap.   The patient returns reporting no troubles in the interim.  He has had no  fever, chills, or other systemic symptoms.  He has had no awareness of  odor, no increase in pain, no breakthrough draining, and nothing to make  him feel things are anything other than improved.   PHYSICAL EXAMINATION:  VITAL SIGNS:  Blood pressure is 152/86, pulse 84,  respirations 18, temperature 96.9.  EXTREMITIES:  The right lower extremity is indeed completely healed and  largely free of edema.  The left lower extremity persists with a single  ulceration in the anterolateral mid pretibial area measured 2.2 x 0.8 x  0.1 cm.  This has a little bit of the fibrinopurulent exudate in the  base.   IMPRESSION:  Satisfactory course with gradual resolution of all  ulcerations.   DISPOSITION:  1. The patient has a venous pump at home and is advised that we will      leave him out of treatment on his right lower extremity but that he      should begin tonight to resume venous pumping treatment starting at      35 mmHg and increasing this 5 mm at a time as  tolerated until he      gets to 50 mm.  He is advised to do this for one hour tonight on      that extremity.  The left lower extremity is subjected to selective      debridement of the wound with removal of the fibrinopurulent      exudate from the wound base.  It then looks rather clean.  It is      treated with application of a little silver line pad, then that      extremity is returned to an Unna wrap.  2. Follow-up visit will be here in one week, sooner p.r.n.           ______________________________  Jonelle Sports. Cheryll Cockayne, M.D.     RES/MEDQ  D:  02/19/2008  T:  02/20/2008  Job:  [id number]

## 2011-04-25 NOTE — Assessment & Plan Note (Signed)
[facility]   NAME:  [name], [name]               ACCOUNT NO.:  [id number]   MEDICAL RECORD NO.:  [id number]      DATE OF BIRTH:  06/18/1941   PHYSICIAN:  Jonelle Sports. Sevier, M.D.  VISIT DATE:  12/30/2008                                   OFFICE VISIT   HISTORY:  This 70 year old white male is followed for a severe stasis  dermatitis in lower extremities with multiple ulcerations involving both  lower extremities.   When last seen a week ago, he had nearly healed the lesions on the right  lower extremity with only one area requiring application of Silverlon  beneath a Profore wrap.  He had multiple active lesions on the left  lower extremity.   During the past week, he has had some persistent burning pain, which is  not new and which he tolerates without medication.  He has had no fever,  chills, or systemic symptoms.  He has noticed no increased odor or any  other problems.   PHYSICAL EXAMINATION:  VITAL SIGNS:  Blood pressure 160/81, pulse 95,  respirations 20, temperature 98.2.  Random blood glucose 128 mg/dL.  EXTREMITIES:  The wounds on the right lower extremity are now completely  healed and that extremity has only minimally edematous, but still with  lots of chronic stasis skin changes.  On the left lower extremity, he  has persisting several ulcerations, 2 on the medial aspect of the ankle  and 2 larger ulcers more laterally and rather proximal to the lateral  malleolus.  These are all documented in the chart as to size and  characteristics and that will not be detailed here.  They all have some  degree of crusts and also a considerable adherent soft slough throughout  their bases.   IMPRESSION:  Resolution stasis ulcers, right lower extremity;  persistence of multiple ulcerations, left lower extremities, but with  improvement.   DISPOSITION:  The patient will be allowed to return to his compression  stocking on the right lower extremity, but is  advised to notice by [place]  right away should he have any recurrence of lesions there despite use of  that stocking.   The wounds on the left lower extremity are all sharply and selectively  debrided with removal of slough and crusts rendering the wound bases  fairly healthy and with some pretty rigorous bleeding from the lateral  lesions.  This is controlled with compression.   Following hemostasis, these areas are all treated with applications of  Aquacel a.c. and covered with absorptive pads.  That extremity is then  returned to a Profore wrap.   Followup visit will be here in 1 week.           ______________________________  Jonelle Sports. Cheryll Cockayne, M.D.     RES/MEDQ  D:  12/30/2008  T:  12/31/2008  Job:  [id number]

## 2011-04-25 NOTE — Assessment & Plan Note (Signed)
[facility]   NAME:  [name], [name]               ACCOUNT NO.:  [id number]   MEDICAL RECORD NO.:  [id number]      DATE OF BIRTH:  Apr 10, 1941   PHYSICIAN:  Maxwell Caul, M.D. VISIT DATE:  05/06/2009                                   OFFICE VISIT   [name] is a gentleman who has been followed here chronically with  severe bilateral venous stasis and diabetic neuropathy.  He has 3  superficial ulcers of his left lower extremity.  He notes that a month  or 4-6 weeks ago, they were down to one ulcer.  However, earlier this  month, he developed 2 new areas.  He does not complain specifically of  ulcer pain.  However, he does have fairly severe neuropathic pain.   On examination, his temperature is 97.9, pulse 99, respirations 20, and  blood pressure 137/80.  All 3 areas are essentially in the same  condition and were covered with a light eschar.  We did a non-excisional  debridement of these areas.  Most notable with the gritty texture to the  surface of the wound leading me to believe that the surface of these is  not quite as viable as it first appeared.  The tan-colored slough I  initially tried to remove was fairly easily done; however, he has a  fairly difficult surface here that I suspect will be somewhat difficult  to heal.   IMPRESSION:  Bilateral venous stasis disease.  There is no evidence of  infection here.  Nothing needed culturing.  I applied the Puracol AG,  dry dressing, and put this in an Unna wrap.  I suspect he will need  further mechanical debridement secondary to the texture of the surface  which is not so obvious to the naked eye.           ______________________________  Maxwell Caul, M.D.     MGR/MEDQ  D:  05/07/2009  T:  05/08/2009  Job:  [id number]

## 2011-04-25 NOTE — Assessment & Plan Note (Signed)
[facility]   NAME:  [name], [name]               ACCOUNT NO.:  [id number]   MEDICAL RECORD NO.:  [id number]      DATE OF BIRTH:  October 29, 1941   PHYSICIAN:  Joanne Gavel, M.D.         VISIT DATE:  04/21/2009                                   OFFICE VISIT   HISTORY:  This is a 70 year old male with a long history of venous  stasis ulcer, treated for ulcers in lateral aspect of his left leg.  When seen on May 5, there were 2 new areas of ulceration.   Today, physical examination reveals he is awake, alert, afebrile.  He  has 3 ulcerations listed in his wound 17, 21, and 22.  Each of these  ulcerations is covered with a thick slough, which was debrided using a  forceps to healthy bleeding.  Treatment will be with calcium  alendronate, and we will try an Radio broadcast assistant.  See him in 7 days.      Joanne Gavel, M.D.  Electronically Signed     RA/MEDQ  D:  04/21/2009  T:  04/22/2009  Job:  [id number]

## 2011-04-25 NOTE — Assessment & Plan Note (Signed)
[facility]   NAME:  ARLENE, GENOVA               ACCOUNT NO.:  [id number]   MEDICAL RECORD NO.:  [id number]      DATE OF BIRTH:  03-17-1941   PHYSICIAN:  Theresia Majors. Tanda Rockers, M.D.      VISIT DATE:                                   OFFICE VISIT   SUBJECTIVE:  Mr. Reid is a 70 year old man who we follow for bilateral  stasis ulcerations.  During the interim, he has been relatively active  having [place]  during the recent inclement weather.  He has complained of some  swelling, but he has had no excessive pain.  There has been moderate  drainage, but no fever.   OBJECTIVE:  Blood pressure is 137/65, respirations 18, pulse rate 90,  temperature 97.7.  Capillary blood glucose is 171 mg percent.   Inspection of the lower extremities shows a persistence of 2+ edema.  The wounds have decreased in area.  Wounds #4 and 2B have completely  resolved.  There is persistent edema, however.  Wound #1 on the left  lower extremity has similarly decreased in area.  There is no evidence  of infection.  There is no evidence of vascular compromise.   ASSESSMENT:  Persistent stasis and ulceration.   PLAN:  We will replace the patient in bilateral Unna boots and  reevaluate him in one week.      Harold A. Tanda Rockers, M.D.  Electronically Signed     HAN/MEDQ  D:  08/19/2007  T:  08/19/2007  Job:  [id number]

## 2011-04-25 NOTE — Assessment & Plan Note (Signed)
[facility]   NAME:  [name], [name]               ACCOUNT NO.:  [id number]   MEDICAL RECORD NO.:  [id number]      DATE OF BIRTH:  09-09-1941   PHYSICIAN:  Theresia Majors. Tanda Rockers, M.D. VISIT DATE:  01/06/2008                                   OFFICE VISIT   SUBJECTIVE:  [name] is a 70 year old man who we have followed  for bilateral stasis ulcerations.  We have treated him with serial  debridements, sequential leg pump,s Apligraf's and multilayered  compression wraps.  He returns for follow-up.  There has been no  excessive drainage, malodor, pain or fever.   OBJECTIVE:  VITALS:  Blood pressure is 142/86, respirations of 20, pulse  rate 85, temperature 97.6, capillary blood glucose is 140 mg percent.  Inspection of the left lateral leg shows that there is 100 percent  granulation with continued constriction of the ulcer itself. Edema is  well-controlled. Right leg is completely resolved.  [name]' lower  extremities are the best that they have appeared over his course of  treatment in the wound center.   ASSESSMENT:  Clinical improvement of bilateral stasis ulcers.   PLAN:  We will continue the Unna boot on the left lower extremity.  We  will continue bilateral sequential leg pumping and reevaluate the  patient in 1 week.      Harold A. Tanda Rockers, M.D.  Electronically Signed     HAN/MEDQ  D:  01/06/2008  T:  01/07/2008  Job:  [id number]

## 2011-04-25 NOTE — Assessment & Plan Note (Signed)
[facility]   NAME:  [name], [name]               ACCOUNT NO.:  [id number]   MEDICAL RECORD NO.:  [id number]      DATE OF BIRTH:  1941-04-09   PHYSICIAN:  Jonelle Sports. Sevier, M.D.  VISIT DATE:  06/09/2009                                   OFFICE VISIT   HISTORY:  This 70 year old white male is being treated for bilateral  venous stasis ulcerations which have been multiple on both lower  extremities.  Happily, he has recently shown considerable improvement.  It is now down to 3 wounds, one on the right and two on the left.  He  states that he has had no particular problems since his last visit here  and most specifically no increase in odor, pain, discharge, and no  systemic symptoms.   PHYSICAL EXAMINATION:  VITAL SIGNS:  Blood pressure is 144/83, pulse 97,  respirations 21, and temperature 97.9.  EXTREMITIES:  The single wound persisting on the right medial ankle  measures 2.9 x 0.3 x 0.1.  Two lateral wounds on the distal left lower  extremity measure approximately 1.5 x 1.5 x 0.1 and distally 1.5 x 1.7 x  0.1.  All of these are slightly smaller than before.  Each wound has  some degree of fibrinous slough in the base which is not tightly  adherent.   IMPRESSION:  Considerable progress of chronic venous stasis ulcerations,  bilateral lower extremities.   DISPOSITION:  All wounds are debrided selectively of the slough using a  scalpel with good tolerance without bleeding.  The wounds were then  dressed with an application of Puracol AG covered with a little hydrogel  and a small piece of dry gauze in both extremities and returned to [employer].   The patient allows [place] how he has been less than faithful in using his  venous pumps at home despite which he has had no regression, but I have  cautioned him to continue this as regularly as humanly possible because  of the benefit that he has obviously already realized from this type of  therapy.   Follow up  visit will be here in 9 days.           ______________________________  Jonelle Sports Cheryll Cockayne, M.D.     RES/MEDQ  D:  06/09/2009  T:  06/09/2009  Job:  [id number]

## 2011-04-25 NOTE — Assessment & Plan Note (Signed)
[facility]   NAME:  FAWAZ, BORQUEZ               ACCOUNT NO.:  [id number]   MEDICAL RECORD NO.:  [id number]      DATE OF BIRTH:  [birth year]   PHYSICIAN:  Theresia Majors. Tanda Rockers, M.D. VISIT DATE:  11/28/2007                                   OFFICE VISIT   SUBJECTIVE:  Mr. Velazquez is a 70 year old man with bilateral stasis  ulcers.  In the interim we have treated him with Unna wraps and  segmental leg pumps.  He reports that there has been less pain, the  swelling is very well controlled.  There has been no fever or malodor.   OBJECTIVE:  Blood pressure 114/78, respirations 18, pulse rate 72,  temperature is 97.6.  Inspection of the lower extremity shows that the  edema is controlled, it is 1+ bilaterally.  The ulcers show evidence of  decrease in area.  The wounds were photographed by number and cataloged.  Please refer to the data entries.  There is no evidence of active  infection or ischemia.   IMPRESSION:  Clinical improvement a stasis ulcers.   PLAN:  We will continue the Unna wrap and continue the sequential leg  pump.  We will reevaluate the patient in 7-10 days.      Harold A. Tanda Rockers, M.D.  Electronically Signed     HAN/MEDQ  D:  11/28/2007  T:  11/29/2007  Job:  [id number]

## 2011-04-25 NOTE — Assessment & Plan Note (Signed)
[facility]   NAME:  [name], [name]               ACCOUNT NO.:  [id number]   MEDICAL RECORD NO.:  [id number]      DATE OF BIRTH:  Jul 10, 1941   PHYSICIAN:  Leonie Man, M.D.    VISIT DATE:  07/05/2009                                   OFFICE VISIT   PROBLEM:  Bilateral venous stasis disease with bilateral venous leg  ulcers.   [name] returns for evaluation today.  I do not detect any increase in  odor or any increase in drainage since his last visit.  He had been  growing a methicillin-resistant Staph aureus, but on his last cultures  he was growing Enterobacter cloacae, which is currently being treated  with ciprofloxacin 500 mg b.i.d. for a 10 day course.  His current wound  measurements on the right medial ankle is 1.0 x 0.5 x 0.1 and appears  significantly improved.  The ulcers on the left lower extremity measured  1.5 x 1.0 x 1.5 and 2.0 x 1.2 x 0.1, both of these are covered with some  necrotic debris and slough.   Procedure today, the wound is selectively debrided with a curette  removing the great majority of the slough, however, there is some  residual necrotic debris at the base of the wounds on the left leg.  We  will treat these with Santyl dressings, and we will cover this with an  Radio broadcast assistant.   Follow up is planned for 1 week from now.  I think this patient, because  of his severe and chronic low back pain, should be evaluated by a pain  clinic.  He has already been declared inoperable.  As far as his back  pain is concerned, he continues to have debilitating pain from his back.  I will ask Dr. Abigail Miyamoto to consider this possibility for this gentleman.      Leonie Man, M.D.  Electronically Signed     PB/MEDQ  D:  07/05/2009  T:  07/06/2009  Job:  [id number]   cc:   Chales Salmon. Abigail Miyamoto, M.D.

## 2011-04-25 NOTE — Assessment & Plan Note (Signed)
[facility]   NAME:  [name], [name]               ACCOUNT NO.:  [id number]   MEDICAL RECORD NO.:  [id number]      DATE OF BIRTH:  [birth year]   PHYSICIAN:  Maxwell Caul, M.D. VISIT DATE:  06/10/2008                                   OFFICE VISIT   [name] returns today in followup for his bilateral venous stasis  disease.  We have been treating him with Silverlon to the left leg under  an Unna wrap and purely an Unna wrap on the right leg.  The original  problematic area had been on the medial malleolus of the right leg.  However, this is improved dramatically.  He also has an external  compression pump at home.   On examination, he appears well.  There is good edema control, right  greater than left.  His area on the right medial ankle has now  essentially resolved.  Unfortunately, he has developed several areas on  the left lateral ankle that are really not improving.  Since his last  visit in the nurse clinic, he has 2 open areas here.  His peripheral  pulses are easily palpable.  There is no evidence of cellulitis.   IMPRESSION:  Venous stasis ulceration.  We applied Hydrofera Blue to the  areas on the left leg and rewrapped in an [employer].  He has a plain Unna to  the right leg.  I am really hopeful that these will close over at which  time we can transition him into either graded pressure stockings or his  pure external compression pump.           ______________________________  Maxwell Caul, M.D.     MGR/MEDQ  D:  07/10/2008  T:  07/11/2008  Job:  [id number]

## 2011-04-25 NOTE — Assessment & Plan Note (Signed)
[facility]   NAME:  [name], [name]               ACCOUNT NO.:  [id number]   MEDICAL RECORD NO.:  [id number]      DATE OF BIRTH:  01-23-1941   PHYSICIAN:  Leonie Man, M.D.    VISIT DATE:  07/26/2009                                   OFFICE VISIT   PROBLEM:  Bilateral venous stasis disease with bilateral venous ulcers  on the left leg superior and laterally.  There is a 1.1 x 1.7 x 0.1  venous stasis ulcer.  On the right medial ankle, there is a 0.9 x 0.8 x  0.1 ulcer.   [name] is a 70 year old obese gentleman who has been coming for  recurrent venous leg ulcers over the past 3 years.  His current ulcers  seem to be healing satisfactorily.  I do not see whether he has had any  recent venous Doppler studies.  I will discuss this with him on his next  evaluation.   He has been treated over the past few evaluations with Promogran  followed by an Radio broadcast assistant.  This seems to be working very well.   EXAMINATION:  Today, the patient's vital signs are temperature 98.2,  pulse 88, respirations 18 and blood pressure is 129/71.  Capillary blood  glucose is 127.  Both wounds are having significantly good  epithelialization, two small ulcers on the lateral left leg have now  completely healed.   TREATMENT:  Today will again consist of Promogran with an Radio broadcast assistant.  Follow up with [name] will be again in 1 week.      Leonie Man, M.D.  Electronically Signed     PB/MEDQ  D:  07/26/2009  T:  07/27/2009  Job:  [id number]

## 2011-04-25 NOTE — Assessment & Plan Note (Signed)
[facility]   NAME:  [name], [name]               ACCOUNT NO.:  [id number]   MEDICAL RECORD NO.:  [id number]      DATE OF BIRTH:  05/28/1941   PHYSICIAN:  Joanne Gavel, M.D.         VISIT DATE:  03/24/2009                                   OFFICE VISIT   HISTORY:  This is a 70 year old male with stasis ulceration on the  lateral aspect of his left lower extremity.  He also has diabetes with  advanced neuropathy.  He has been in a Profore wrap with Santyl on the  wound.   PHYSICAL EXAMINATION:  His blood sugar is 122.  He is afebrile.  Pulse  and blood pressure, respirations are stable.  Today, the wound  measurements are 5.7 x 1.8 x 0.02, this is a marked improvement over his  last visit.   The bases appeared to be clean.  Dr. Cheryll Cockayne did curette debridement on  March 17, 2009, and I do not believe that is necessary today.  There is  also an area on the medial thigh which appears to be a ringworm, which  we will treat with ketoconazole.  Otherwise, we will see him in 1 week  and continue the same treatment which includes Profore and silver  alginate.           ______________________________  Joanne Gavel, M.D.     RA/MEDQ  D:  03/24/2009  T:  03/25/2009  Job:  [id number]

## 2011-04-25 NOTE — Assessment & Plan Note (Signed)
[facility]   NAME:  JOSHAWN, CRISSMAN               ACCOUNT NO.:  [id number]   MEDICAL RECORD NO.:  [id number]      DATE OF BIRTH:  [birth year]   PHYSICIAN:  Theresia Majors. Tanda Rockers, M.D. VISIT DATE:  12/09/2007                                   OFFICE VISIT   SUBJECTIVE:  Mr. Axel is a 70 year old we follow for bilateral stasis  ulcers. Currently he is utilizing bilateral wraps with sequential  compression pumping. He continues to be ambulatory. There is no  excessive drainage, malodor, pain or fever.   OBJECTIVE:  Blood pressure 147/78, respirations 18, pulse rate 84,  temperature 98. Capillary blood glucose 158 mg percent. Inspection of  the lower extremities shows 1+ edema bilaterally. The ulcers themselves  show evidence of continued decrease in area. There is no evidence of  active infection or vascular insufficiency. Please refer to the data  entries for measurements and photos.   ASSESSMENT:  Clinical improvement.   PLAN:  We will continue the compression wrap therapy with the sequential  pumping. We will re-evaluate the patient in one week.      Harold A. Tanda Rockers, M.D.  Electronically Signed     HAN/MEDQ  D:  12/09/2007  T:  12/09/2007  Job:  [id number]

## 2011-04-25 NOTE — Assessment & Plan Note (Signed)
[facility]   NAME:  [name], [name]               ACCOUNT NO.:  [id number]   MEDICAL RECORD NO.:  [id number]      DATE OF BIRTH:  Oct 06, 1941   PHYSICIAN:  Theresia Majors. Tanda Rockers, M.D. VISIT DATE:  12/16/2007                                   OFFICE VISIT   SUBJECTIVE:  [name] is a 70 year old man with bilateral stasis  ulcers.  In the interim, we have treated him with external compression  and a sequential leg pump.  He reports that he continues to be minimally  ambulatory.  There has been no pain and no fever.   OBJECTIVE:  Blood pressure is 114/67, respirations 18, pulse rate 92,  temperature is 98.0, capillary blood glucoses 128 mg percent.  Inspection of the lower extremity shows a persistence of 2+ bilateral  edema on the left lower extremity.  The ulcer shows definite decrease in  the area and in volume. On the right, the edema is persistent with a  soft eschar over the medial distal leg ulceration.  Capillary refill is  brisk.   ASSESSMENT:  Stasis with ulceration, improved.   PLAN:  We will return the patient to an Unna boot compression wrap.  He  has been instructed to continue his sequential leg pump.  We will  reevaluate him in 1 week.      Harold A. Tanda Rockers, M.D.  Electronically Signed     HAN/MEDQ  D:  12/16/2007  T:  12/16/2007  Job:  [id number]

## 2011-04-25 NOTE — Assessment & Plan Note (Signed)
[facility]   NAME:  [name], [name]               ACCOUNT NO.:  [id number]   MEDICAL RECORD NO.:  [id number]      DATE OF BIRTH:  [birth year]   PHYSICIAN:  Leonie Man, M.D.    VISIT DATE:  02/10/2009                                   OFFICE VISIT   PROBLEM:  Venous stasis disease with chronic venous stasis ulcers on the  left lateral malleolar area.  Ulcer currently measuring 7.3 x 3.9 x 0.1  cm.   The patient has been treated over the past several weeks with silver  alginate, Sofsorb, and a Profore wrap.  This he has been tolerating  quite well.   PHYSICAL EXAMINATION:  VITAL SIGNS:  Today, temperature 97.8, pulse is  93, respirations 20, and blood pressure 133/78.  EXTREMITIES:  Examination of the leg:  There is some moderate-to-large  edema.  No weeping, however, no significant drainage and no odor.  The  base of the ulcer shows about 70% granulation with about 30% area of  fibrinous exudate.  Pulses are palpable.  Web spaces are normal.   TREATMENT AND PLAN:  The patient was treated today with a skin  emollient, silver alginate, Sofsorb, and a Profore dressing.  Followup  will be in 1 week.      Leonie Man, M.D.  Electronically Signed     PB/MEDQ  D:  02/10/2009  T:  02/11/2009  Job:  [id number]

## 2011-04-25 NOTE — Assessment & Plan Note (Signed)
[facility]   NAME:  [name], [name]               ACCOUNT NO.:  [id number]   MEDICAL RECORD NO.:  [id number]      DATE OF BIRTH:  Dec 19, 1940   PHYSICIAN:  Leonie Man, M.D.    VISIT DATE:  08/02/2009                                   OFFICE VISIT   PROBLEM:  Bilateral venous stasis disease with bilateral venous ulcers.  Current dimensions are 0.6 x 1.3 and less than 0.1 on the right medial  leg.   The patient returns today for reevaluation.   PHYSICAL EXAMINATION:  His wounds are just about healed except for the  left lateral leg, which is about 0.6 x 1.3.\  We will continue him  with Promogran and bilateral Unna boots for another week.  Hopefully,  this will be all cleared up by then.      Leonie Man, M.D.  Electronically Signed     PB/MEDQ  D:  08/02/2009  T:  08/03/2009  Job:  [id number]

## 2011-04-25 NOTE — Assessment & Plan Note (Signed)
[facility]   NAME:  [name], [name]               ACCOUNT NO.:  [id number]   MEDICAL RECORD NO.:  [id number]      DATE OF BIRTH:  02-Oct-1941   PHYSICIAN:  Jonelle Sports. Sevier, M.D.  VISIT DATE:  03/17/2009                                   OFFICE VISIT   HISTORY:  This 70 year old man is followed for stasis ulcerations on the  distal lateral aspect of his left lower extremity.  These are there in  the phase of diabetes with very advanced neuropathy.   He has been in Profore wrap with Santyl on the wound.   PHYSICAL EXAMINATION:  Blood pressure 147/79, pulse 89, respirations 20,  temperature 98.2, and blood glucose this morning was 146 mg/dL.  The  aggregate wounds now measured 5.7 x 3.0 x 0.2 cm and are covered with a  fairly adherent fibrinous slough.   IMPRESSION:  Wounds gradually contracting, but still a long way from  clean and not able to make the kind of progress I would like to see.   DISPOSITION:  The wounds are debrided using a curette, which the patient  tolerates with no anesthesia whatsoever.  This produces vigorous  bleeding, but actually the wounds are clean as we have had in quite a  while.   Following obtaining hemostasis with compression, we will dress him today  with silver alginate, SofSorb pad, and a Profore wrap.   Follow up visit will be in 1 week.           ______________________________  Jonelle Sports. Cheryll Cockayne, M.D.     RES/MEDQ  D:  03/17/2009  T:  03/18/2009  Job:  [id number]

## 2011-04-25 NOTE — Assessment & Plan Note (Signed)
[facility]   NAME:  [name], [name]               ACCOUNT NO.:  [id number]   MEDICAL RECORD NO.:  [id number]      DATE OF BIRTH:  [birth year]   PHYSICIAN:  Theresia Majors. Tanda Rockers, M.D. VISIT DATE:  10/02/2007                                   OFFICE VISIT   SUBJECTIVE:  [name] is a 70 year old man who we are following for  bilateral stasis with ulceration on the left lower extremity.  He has  utilized the compression wrap and sequential leg pump.  There has been  no excessive pain or mechanical failures of the pump.   OBJECTIVE:  VITAL SIGNS:  Blood pressure is 146/75, respirations 20,  pulse rate 90, temperature is 98.3.  Capillary blood glucoses 146 mg%.  EXTREMITIES:  Inspection of the lower extremity shows a persistence of  2+ edema.  On the left lower extremity, the ulcers continue to contract  in volume, as well as area.  There is 100% granulation with advance in  epithelium.  There is no evidence of ascending infection or ischemia.   ASSESSMENT:  Clinical improvement with control of edema of a stasis  ulcer.   PLAN:  We will continue the Unna wrap and the sequential pump.  We will  reevaluate the patient in 10 days.      Harold A. Tanda Rockers, M.D.  Electronically Signed     HAN/MEDQ  D:  10/02/2007  T:  10/02/2007  Job:  [id number]

## 2011-04-25 NOTE — Assessment & Plan Note (Signed)
[facility]   NAME:  [name], [name]               ACCOUNT NO.:  [id number]   MEDICAL RECORD NO.:  [id number]      DATE OF BIRTH:  02/10/1941   PHYSICIAN:  Jonelle Sports. Sevier, M.D.  VISIT DATE:  03/10/2009                                   OFFICE VISIT   HISTORY:  This 70 year old white male has been followed for various  stasis ulcerations of his lower extremities and also has some degree of  arterial insufficiency.  He has been using pumps at home and this has  help to keep his edema down, but he has had 2 persistent and difficult  to clean wounds on his left lower extremity, now that has gotten down to  2 major wounds on the lateral aspect of his distal left calf.  He was  treated last time with debridement and Puracol beneath the Profore wrap.   PHYSICAL EXAMINATION:  VITAL SIGNS:  Today, his blood pressure is  119/77, pulse 93, respirations 21, temperature 97.7, and blood glucose  this morning 124 mg/dL.  EXTREMITIES:  The 2 wounds now are measured as one and collectively are  5.8 x 2.5 x 0.2 cm in size.  They are covered with very discolored  slough that is rather adherent in the wound bases.   IMPRESSION:  Stasis ulcerations of left lower extremity, slow to  improve.   DISPOSITION:  Under cover of 5% topical lidocaine, these wounds are  sharply debrided using a curette to get rid of as much of the slough as  I possibly can.  I then treated with an application of Santyl, wet gauze  covered with a SofSorb pad and that extremity is placed in a Profore  wrap.   He also has an area of what appears to be tinea circinata on the high  medial left calf which is somewhat better after previous application of  ketoconazole.  Accordingly that will be done today before he is placed  in his Profore wrap.   He is to continue use of his venous pumps on a 1 hour twice a day basis.   Followup visit will be here in 1 week.           ______________________________  Jonelle Sports. Cheryll Cockayne, M.D.     RES/MEDQ  D:  03/10/2009  T:  03/11/2009  Job:  [id number]

## 2011-04-25 NOTE — Assessment & Plan Note (Signed)
[facility]   NAME:  SAMAJ, WESSELLS               ACCOUNT NO.:  [id number]   MEDICAL RECORD NO.:  [id number]      DATE OF BIRTH:  1941/05/21   PHYSICIAN:  Leonie Man, M.D.    VISIT DATE:  01/20/2009                                   OFFICE VISIT   PROBLEM:  Venous stasis disease bilaterally with a left lateral  malleolar and lateral anterior calf ulcer.  Current dimensions are 7.8 x  4.0 x 0.1, which is slightly down from its previous measurement of 8 cm  x 4.5 cm.   The patient's legs continue to be moderately swollen.  There is no odor.  There is significant drainage from the lateral aspect of his leg and on  the medial portion of the left leg.  The ulcers on this side has now  completely healed and re-epithelialized.  I debrided the eschar off  these ulcers down to clean strong epithelialization.  Selective  debridement on the lateral wounds to remove hypertrophic granulations  and debris down to good granulating tissues.  These should continue to  heal with continued wraps.  We will go ahead and put him on a sliver  alginate and Profore wrap to be changed weekly.      Leonie Man, M.D.  Electronically Signed     PB/MEDQ  D:  01/20/2009  T:  01/21/2009  Job:  [id number]

## 2011-04-25 NOTE — Assessment & Plan Note (Signed)
[facility]   NAME:  [name], [name]               ACCOUNT NO.:  [id number]   MEDICAL RECORD NO.:  [id number]      DATE OF BIRTH:  September 20, 1941   PHYSICIAN:  Jonelle Sports. Sevier, M.D.  VISIT DATE:  03/11/2008                                   OFFICE VISIT   HISTORY:  This 70 year old white male with diabetes and peripheral  venous insufficiency and lymphedema is followed for stasis ulcerations  of the left lower extremity.   He has been on most recently in an Unna wrap on that extremity and has  been using over this and on his contralateral extremity his venous pump  at home.   He reports that since his last visit he seems to be somewhat less  edematous.  His legs have been free of any pain.  There has been no  breakthrough drainage on his wrap.  There has been no odor, no fever, no  chills or other systemic symptoms.   PHYSICAL EXAMINATION:  VITAL SIGNS:  Blood pressure 151/74, pulse 84,  respirations 18, temperature 98.4.  A self-obtained capillary blood  glucose on the morning of this visit was 140 mg/dL.  EXTREMITIES:  There remains on the left lower extremity one tiny wound  measuring approximately 0.3 x 0.2 x 0.1 cm on the anterolateral distal  aspect of the leg.  The overall edema is indeed less, both in this  extremity and in the other.   IMPRESSION:  Satisfactory progress of stasis ulceration, left lower  extremity.   DISPOSITION:  1. The patient will be returned to his Unna wrap today.  He will be      instructed to continue his twice daily venous pump therapy at home      to both lower extremities.  2. A follow-up visit will be set here in 1 week.           ______________________________  Jonelle Sports. Cheryll Cockayne, M.D.     RES/MEDQ  D:  03/11/2008  T:  03/11/2008  Job:  [id number]

## 2011-04-25 NOTE — Assessment & Plan Note (Signed)
[facility]   NAME:  [name], [name]               ACCOUNT NO.:  [id number]   MEDICAL RECORD NO.:  [id number]      DATE OF BIRTH:  24-Jan-1941   PHYSICIAN:  Jonelle Sports. Sevier, M.D.  VISIT DATE:  02/05/2008                                   OFFICE VISIT   HISTORY:  This 70 year old white male has been seen for a longstanding  venous stasis ulceration on the lower left lateral calf.  He reports  that this has not been healed within the past 9 years.  He is overjoyed  that he has seen considerable progress since his treatment began here.  He has most recently been placed in Unna wraps, actually these have been  used contralaterally, though he has had no open lesions on the other  extremity. but there has been edema there.   Since his last visit, a week ago, he has had no new problems.  No  breakthrough drainage, no odor, no fever, chills, or systemic symptoms.  No pain, no increase in swelling.   PHYSICAL EXAMINATION:  Blood pressure 179/89, pulse 84, respirations 20,  temperature 98.9.  Self reported blood glucose 94 mg/dL this morning.   On the lateral aspect of the left calf distally is a lesion measuring  2.3 x 0.6 x 0.1 cm with a very clean and granular base clearly beginning  to contract particularly in the longitudinal dimension.   IMPRESSION:  Improving chronic stasis ulcer left lower extremity.   DISPOSITION:  1. No debridement is required today.  2. The patient is returned to bilateral Unna wraps this time with no      underlying triamcinolone cream.  3. Follow up visit here will be in 1 week.           ______________________________  Jonelle Sports. Cheryll Cockayne, M.D.     RES/MEDQ  D:  02/05/2008  T:  02/06/2008  Job:  [id number]

## 2011-04-25 NOTE — Assessment & Plan Note (Signed)
[facility]   NAME:  [name], [name]               ACCOUNT NO.:  [id number]   MEDICAL RECORD NO.:  [id number]      DATE OF BIRTH:  1941/04/25   PHYSICIAN:  Lenon Curt. Chilton Si, M.D.   VISIT DATE:  02/05/2009                                   OFFICE VISIT   HISTORY:  A 70 year old male with chronic venous stasis disease, ulcers,  and inflammation of the left leg returns for wound check today.  The  patient is basically unchanged since last seen.  He denies any  increasing pain or fever.  He has had previous debridements and then  treatments with silver alginate, SofSorb, and Profore.  The patient has  tolerated the dressings well.   PHYSICAL EXAMINATION:  VITAL SIGNS:  Temperature 97.6, pulse 89,  respirations 20, and blood pressure 116/72.   Capillary glucose 140.  Wound #17, now measures 9.2 x 4.2 x 0.2 cm in  depth.  There is minimal slough.  No eschar, a little red granulation  tissue at the base.  No odor.  Small amount of drainage, which is yellow  in color.   The wound appeared to be a little tender with touching around the edge  and I am concerned about the erythema around the edge.  He previously  had a wound culture in October 2009, that showed Enterobacter cloacae,  this wound is stalled.  This has been previously noted by Dr. Cheryll Cockayne.  He also complains of some moderate increase in discomfort.   We reapplied Silverlon, SofSorb, and Profore dressing.  Prescriptions  were given for Septra DS 1 twice daily for 10 days after a culture was  obtained.  He received another prescription for hydrocodone/APAP 5/500,  50 tablets one every 6 hours as needed for pain control.   He is to return on Wednesday February 10, 2009.   ICD-9 CODE:  1. 454.2, varicose veins with ulcer and inflammation.  2. 682.6, cellulitis.   CPT CODE:  16109.      Lenon Curt Chilton Si, M.D.  Electronically Signed     AGG/MEDQ  D:  02/05/2009  T:  02/06/2009  Job:  [id number]

## 2011-04-25 NOTE — Assessment & Plan Note (Signed)
[facility]   NAME:  [name], [name]               ACCOUNT NO.:  [id number]   MEDICAL RECORD NO.:  [id number]      DATE OF BIRTH:  [birth year]   PHYSICIAN:  Jonelle Sports. Sevier, M.D.  VISIT DATE:  10/07/2008                                   OFFICE VISIT   HISTORY:  This 70 year old white male who has been followed for  refractory scattered stasis ulcerations of the right lower extremity.  He has been treated most recently with Puracol AG to these lesions, ABD  pads, and a Profore wrap.  He does have moderate drainage.  The wounds  were cultured 3 weeks ago and he was found to have some Enterobacter  cloacae in the wound and was treated with 7-day course of Cipro 500 mg  b.i.d. which has been completed.   He reports some pain in that leg at night, but otherwise only occasional  aching.   PHYSICAL EXAMINATION:  VITAL SIGNS:  Blood pressure 144/89, pulse 93,  respirations 12, and temperature 97.8.  Capillary blood glucose 159  mg/dL.   The patient now has a total of 5 wounds on that extremity in various  locations both proximal and distal, these are described and measurements  were recorded in the chart in the nurse's notes.  All of these wounds  appear stable, but not to be significantly actively improving.   The patient's arterial circulation is assessed today with a handheld  Dopplers, which show a monophasic signal at the dorsalis pedis in that  left foot and a biphasic signal at the posterior tibial location.  Thus,  it was felt compressive wraps are safe and are not worsening his  situation.   IMPRESSION:  Status quo with multiple chronic wounds, left lower  extremity venous stasis in nature.   DISPOSITION:  The patient will be treated today with application of  Puracol AG and absorptive pads to each of these wounds and be placed in  a Profore wrap.   Arrangements will be made for home health nurse to redress these wounds  in a similar manner every  Monday and Thursday, and follow up visit for  the patient will be scheduled here for 1 month or sooner on a p.r.n.  basis.           ______________________________  Jonelle Sports Cheryll Cockayne, M.D.     RES/MEDQ  D:  10/07/2008  T:  10/08/2008  Job:  [id number]

## 2011-04-25 NOTE — Assessment & Plan Note (Signed)
[facility]   NAME:  JOSHUAH, MINELLA               ACCOUNT NO.:  [id number]   MEDICAL RECORD NO.:  [id number]      DATE OF BIRTH:  07/18/1941   PHYSICIAN:  Theresia Majors. Tanda Rockers, M.D. VISIT DATE:  01/22/2008                                   OFFICE VISIT   SUBJECTIVE:  Mr. Santillanes is a 70 year old man we follow for bilateral  fluid retention stasis and ulcerations.  He continues to use the  sequential leg pumps in conjunction with Unna wraps.  There has been no  excessive drainage, mal odor, pain, or fever.  He continues to be  ambulatory.   OBJECTIVE:  VITAL SIGNS:  Blood pressure 164/84, respirations 20, pulse  rate 91, temperature 98.2.  EXTREMITIES:  Inspection of the left anterior leg shows that the ulcer  continues to contract.  There is a soft scab.  There is no evidence of  infection.  Both feet are symmetrically warm but are not feverish with  no evidence of ischemic compromise.   ASSESSMENT:  Clinical improvement of stasis ulcers, commensurate with  control of edema.   PLAN:  We will continue the Unna wraps, sequential leg pumps, and TCA  cream.  We will re-evaluate the patient in one week.      Harold A. Tanda Rockers, M.D.  Electronically Signed     HAN/MEDQ  D:  01/22/2008  T:  01/23/2008  Job:  [id number]

## 2011-04-25 NOTE — Assessment & Plan Note (Signed)
[facility]   NAME:  [name], [name]               ACCOUNT NO.:  [id number]   MEDICAL RECORD NO.:  [id number]      DATE OF BIRTH:  Jan 20, 1941   PHYSICIAN:  Theresia Majors. Tanda Rockers, M.D. VISIT DATE:  04/06/2008                                   OFFICE VISIT   SUBJECTIVE:  [name] is a 70 year old man who we are following for  bilateral stasis ulcerations.  We have treated him with multilayered  compressive wraps and sequential leg pumps.  He returns for followup.  There has been no interim fever, drainage, or excessive pain.   OBJECTIVE:  Blood pressure is 127/76, respirations 16, pulse rate 88,  temperature is 97.6, capillary blood glucose is 120 mg percent.  Inspection of the lower extremity shows a persistence of 2+ edema with  hyperemia.  The pedal pulses remain palpable.  There is no evidence of  ascending infection.  The ulcers themselves have decreased in area with  wound #1 being completely resolved at this time.  Wound #2 on the right  medial ankle appears to be clean, no debridement is needed.   ASSESSMENT:  Clinical improvement with controlled edema.   PLAN:  We will return the patient to bilateral Unna boots to control the  edema both in the left and right lower extremity.  We will continue the  sequential leg pumps and reevaluate the patient weekly by the nurse and  in 2 weeks by the physician.      Harold A. Tanda Rockers, M.D.  Electronically Signed     HAN/MEDQ  D:  04/06/2008  T:  04/07/2008  Job:  [id number]

## 2011-04-25 NOTE — Assessment & Plan Note (Signed)
[facility]   NAME:  [name], [name]               ACCOUNT NO.:  [id number]   MEDICAL RECORD NO.:  [id number]      DATE OF BIRTH:  04/05/1941   PHYSICIAN:  Jonelle Sports. Sevier, M.D.  VISIT DATE:  09/16/2008                                   OFFICE VISIT   HISTORY:  This 70 year old white male has been under treatment here for  long time with multiple and rather refractory venous stasis ulcers of  both lower extremities.  We finally managed to get his right lower  extremity healed, but he persists with multiple wounds on his left lower  extremity, some which are now confluent.  He has been in since his last  visit here Unna wrap over Silverlon pads etc.  He is using a Tubigrip on  his right leg.   He reports no pain, drainage, odor, fever, or other systemic symptoms.   PHYSICAL EXAMINATION:  Blood pressure 113/72, pulse 87, respirations 18,  and temperature 98.2.  He has multiple lesions of his left lower  extremities as are documented in the chart that will not be repeated  here.  The largest 2 wounds on his proximal lateral left lower extremity  measure together 4.0 x 4.0 x 0.2 cm.  These appear to contain very  significant biofilm on the base.   His medial wound at the malleolar area measures 2.5 x 6.0 x 0.3 cm and  again has what appears to be biofilm at the base.   IMPRESSION:  Chronic venous insufficiency with ulcerations with failure  to heal likely due to bacterial burden.   DISPOSITION:  The two lateral and one medial predominant wounds as I  have indicated above were all very sharply debrided off this biofilm  under cover of topical lidocaine.  Once this had been accomplished, the  wounds were treated with an application of Puracol AG, ABD pad, and a  Profore wrap.  Prior to the dressing of the wounds and following  debridement, the lateral wound was cultured appropriately.   The patient will be seen in followup in 1 week at which time hopefully  the  culture results will be available and we can see how his progress is  with this new approach.           ______________________________  Jonelle Sports. Cheryll Cockayne, M.D.     RES/MEDQ  D:  09/16/2008  T:  09/17/2008  Job:  [id number]

## 2011-04-25 NOTE — Assessment & Plan Note (Signed)
[facility]   NAME:  [name], [name]               ACCOUNT NO.:  [id number]   MEDICAL RECORD NO.:  [id number]      DATE OF BIRTH:  1941-10-21   PHYSICIAN:  Jonelle Sports. Sevier, M.D.  VISIT DATE:  02/17/2009                                   OFFICE VISIT   HISTORY:  This 70 year old white male has been followed for stasis  ulcerations on both legs in the phase of severe chronic venous  insufficiency with both venous and lymphedema.   His right lower extremity lesions have healed, and he reports that the  one that previously existed on the medial aspect of the left lower  extremity has healed as well.  He has 2 lesions or other bridge lesion  distally on the lateral aspect of the distal left lower extremity, which  remain are principal concern at this point.  He has noticed no increased  drainage, no odor, no pain, nothing else particularly different.  He  does have some proximal edema in that extremity above where his wrap  reached on last occasion and he also has on the medial aspect of that  high right calf.  A circinate lesion that appears to be tinea circinata.   PHYSICAL EXAMINATION:  VITAL SIGNS:  Blood pressure 177/91, pulse 109,  respirations 22, temperature 97.0, blood glucose 140 mg/dL.   The bridged ulcer on the distal lateral left lower extremity measures 7  x 3.8 x 0.1 cm and has a fairly clean base.  Indeed, the medial lesion  is completely healed.  The small surface of that lesion proximally on  that extremity does appear to represent some type of either tinea  versicolor, principal lesion or tinea circinata.   ASSESSMENT:  Satisfactory course overall.   DISPOSITION:  1. The area of tinea circinata will be treated with application of      ketoconazole.  2. The lateral wound, which is bridged is treated with minimal      superficial debridement of some small amounts of slough, but it is      determinately quite clean.  3. It will be dressed with  an application of silver alginate, SofSorb      pads, and a Profore wrap to that extremity with care being taken at      the wrap reaches proximal to the bulge is swelling up above.  4. The patient will continue his Septra DS that he is currently taking      (previous culture Staph aureus) and he is to begin the use of his      venous pumps, which he has at home 1 hour twice daily being used      over the wrap on the left lower extremity.  5. He will see the nurse here in 1 and 2 weeks for dressing changes.      He will be seen here by the physician in 3 weeks.           ______________________________  Jonelle Sports Cheryll Cockayne, M.D.     RES/MEDQ  D:  02/17/2009  T:  02/18/2009  Job:  [id number]

## 2011-04-25 NOTE — Assessment & Plan Note (Signed)
[facility]   NAME:  [name], [name]               ACCOUNT NO.:  [id number]   MEDICAL RECORD NO.:  [id number]      DATE OF BIRTH:  08-19-1941   PHYSICIAN:  Jonelle Sports. Sevier, M.D.  VISIT DATE:  02/12/2008                                   OFFICE VISIT   HISTORY:  This 70 year old, white male has been followed for a stasis  ulceration on the anterolateral aspect of his left shin and also for  chronic edema in both lower extremities.   Prior to the development of this ulceration, he had been using a venous  pump at home with some satisfaction.  He reports that he has had  difficulty with compression hose in the past.   He reports no increased drainage, no odor, no pain, no fever or systemic  symptoms.   PHYSICAL EXAMINATION:  VITAL SIGNS:  Blood pressure 123/72, pulse 88,  respirations 18, temperature 98.3.  EXTREMITIES:  The lesion on the left lower leg now measures 2.2 x 0.6 x  0.05-cm and is about 80% epithelialized.  The right lower extremity is  free of any active lesions.  Both remain somewhat edematous.   IMPRESSION:  Near resolution of venous stasis ulcer, left lower  extremity.   DISPOSITION:  1. Both extremities are returned today to [employer].  The left medial      malleolar area on the left lower extremity where the skin is very      damaged is protected by a large foam pad beneath the wrap.  2. Followup visit will be here in 1 week.           ______________________________  Jonelle Sports. Cheryll Cockayne, M.D.     RES/MEDQ  D:  02/12/2008  T:  02/13/2008  Job:  [phone]

## 2011-04-25 NOTE — Assessment & Plan Note (Signed)
[facility]   NAME:  [name], [name]               ACCOUNT NO.:  [id number]   MEDICAL RECORD NO.:  [id number]      DATE OF BIRTH:  04-Aug-1941   PHYSICIAN:  Theresia Majors. Tanda Rockers, M.D. VISIT DATE:  04/20/2008                                   OFFICE VISIT   SUBJECTIVE:  [name] is a 70 year old man who we have followed for  bilateral stasis ulcers.  In the interim, he has complained of some  fluctuance in the infrapatellar area associated with anterior tibial  tubercle on the left lower extremity.  This area is moderately warm to  touch and semifluctuant.  He continues to be ambulatory.   OBJECTIVE:  VITAL SIGNS:  Blood pressure is 139/75, respirations 18,  pulse rate 97, temperature 97.5, and capillary blood glucose is 118.  EXTREMITIES:  The inspection of the lower extremity shows that the right  medial ankle ulcer has decreased.  The edema is reasonably well  controlled in the distal portion of the left lower extremity, but at the  area there is warmth and fluctuance.  There is local edema.  There is a  new wound #9 over the left anterior lower extremity, which is full  thickness and no evidence of infection.  The pedal pulse remains  palpable bilaterally.   Betadine paint was used to antiseptically prep the area of fluctuance in  the left lower extremity.  Thereafter, local infiltration of 1%  Xylocaine was performed.  A needle aspiration was attempted, but there  was no extraction.   ASSESSMENT:  Bilateral stasis.   PLAN:  We will resume bilateral Unna boots.  The patient will be  reevaluated by the nurse on a weekly basis and by the physician in 2  weeks.      Harold A. Tanda Rockers, M.D.  Electronically Signed     HAN/MEDQ  D:  04/20/2008  T:  04/21/2008  Job:  [id number]

## 2011-04-25 NOTE — Assessment & Plan Note (Signed)
[facility]   NAME:  [name], [name]               ACCOUNT NO.:  [id number]   MEDICAL RECORD NO.:  [id number]      DATE OF BIRTH:  19-Nov-1941   PHYSICIAN:  Theresia Majors. Tanda Rockers, M.D. VISIT DATE:  07/08/2007                                   OFFICE VISIT   SUBJECTIVE:  [name] is a 70 year old man who we follow for bilateral  stasis ulcers.  We have treated him with Unna wraps.  In the interim he  reports that there is decrease in drainage and no pain.   OBJECTIVE:  Blood pressure is 175/83, respirations of 20, pulse rate 87,  temperature is 97.8.  Capillary blood glucose is 149 mg percent.  Inspection of the lower extremity shows that the ulcers have decreased  in area, the hyperemia is less, there is no evidence of ischemia and  there is no excessive drainage.   ASSESSMENT:  Clinical improvement of all wounds, please refer to the  wound expert entries for measurements.   PLAN:  We will return the patient to Unna boots and we will reevaluate  him in 1 week      Harold A. Tanda Rockers, M.D.  Electronically Signed     HAN/MEDQ  D:  07/08/2007  T:  07/09/2007  Job:  [id number]

## 2011-04-25 NOTE — Assessment & Plan Note (Signed)
[facility]   NAME:  [name], [name]               ACCOUNT NO.:  [id number]   MEDICAL RECORD NO.:  [id number]      DATE OF BIRTH:  [birth year]   PHYSICIAN:  Jonelle Sports. Sevier, M.D.  VISIT DATE:  04/14/2009                                   OFFICE VISIT   HISTORY:  This 70 year old white male has been followed for venous  stasis ulcerations on the lateral aspect of his left lower extremity.  These have been earlier infected with non-MRSA staph which was treated  and tendency toward biofilm formation earlier seems to have lessened  considerably.   Unfortunately on arrival today, he has 2 new areas of ulceration  proximal to the one that has been under treatment.  This related  presumably to folds that were within his compressive wrap.   He has had no fever, chills, drainage, odor, or systemic symptoms.   PHYSICAL EXAMINATION:  Blood pressure 160/84, pulse 94, respirations 20,  temperature 98.3.  The original lesion measures 2.3 x 1.9 x 0.2 cm and  is covered with semi-gelatinous slough throughout most of the wound  base.  The two new lesions measured 1.5 x 1.5 x 0.1 and 2.1 x 1.5 x 0.1  respectively and really have limited slough in the base of these.  They  are obviously considerably more shallow than his primary wound.   IMPRESSION:  Satisfactory course of primary wound with secondary wounds  likely related to pressure or irritation from his compressive wrap.   DISPOSITION:  All wounds are selectively debrided to get rid of the  slough as described above.  They are relatively clean one.  This is  completed.   All wounds will be dressed with applications of silver alginate over  hydrogel.  The areas were then protected with a piece of Adaptic and  that extremity was returned to a Profore wrap.   Incidentally, ketoconazole was again applied to a small area of tinea  circinata on the high medial aspect of that calf.   Followup visit will be here in 1  week.           ______________________________  Jonelle Sports. Cheryll Cockayne, M.D.     RES/MEDQ  D:  04/14/2009  T:  04/14/2009  Job:  [id number]

## 2011-04-25 NOTE — Assessment & Plan Note (Signed)
[facility]   NAME:  [name], [name]               ACCOUNT NO.:  [id number]   MEDICAL RECORD NO.:  [id number]      DATE OF BIRTH:  Sep 14, 1941   PHYSICIAN:  Maxwell Caul, M.D. VISIT DATE:  06/24/2008                                   OFFICE VISIT   [name] has been followed for recalcitrant venous stasis ulcers.  When  he was here on July 1, he had new ulcerations on the left leg in the  setting of severe bilateral venous stasis.  We have been treating him  with Silverlon to the left leg under an Unna wrap and purely an Unna  wrap on the right leg.  He returns today in followup.  I am not certain  he is using his external compression pump.   On examination, temperature is 98.0, pulse 91, respirations 22, blood  pressure 146/61.  The 3 areas on the right medial ankle are considerably  improved and are well epithelialized.  The area on the left lateral leg  is still fairly open, although there is good edema control and I think  things look improved.   IMPRESSION:  Bilateral venous stasis wounds.  On the left lateral leg,  we have reapplied the Silverlon under the Unna and to the right medial  leg we just continued with the Unna.  I think the right medial leg will  actually close over.           ______________________________  Maxwell Caul, M.D.     MGR/MEDQ  D:  06/24/2008  T:  06/25/2008  Job:  [id number]

## 2011-04-25 NOTE — Assessment & Plan Note (Signed)
[facility]   NAME:  [name], [name]               ACCOUNT NO.:  [id number]   MEDICAL RECORD NO.:  [id number]      DATE OF BIRTH:  1941/05/24   PHYSICIAN:  Leonie Man, M.D.    VISIT DATE:  08/10/2009                                   OFFICE VISIT   PROBLEM:  Bilateral venous stasis disease with bilateral venous ulcers.  On today's measurement, there are no open wounds of either leg.  There  is an area of scabbing that measures 0.7 x 0.4 x 0.1 on the left lateral  leg.   On examination, his temperature is 97.8, pulse 83, respirations 19,  blood pressure 133/77.  Capillary blood glucose 120.   Both legs are somewhat dry.  We will apply Bag Balm to both legs.  I  will apply Kerlix and Coban for 1 additional week of mild compression in  this patient.  I have given him a prescription to order venous stockings  at 20-30 mmHg pressure for continual use while up during the daytime.  We will see him again in approximately 1 week.  He will hopefully return  with his stockings for [place] to fit them for.      Leonie Man, M.D.  Electronically Signed     PB/MEDQ  D:  08/10/2009  T:  08/11/2009  Job:  [id number]

## 2011-04-25 NOTE — Assessment & Plan Note (Signed)
[facility]   NAME:  [name], [name]               ACCOUNT NO.:  [id number]   MEDICAL RECORD NO.:  [id number]      DATE OF BIRTH:  Jul 29, 1941   PHYSICIAN:  Maxwell Caul, M.D. VISIT DATE:  08/05/2008                                   OFFICE VISIT   Mr. [name] returns today in followup for his severe bilateral venous  stasis.  He had been seen last week and had a very problematic area on  the lateral aspect of his left leg.  There was some further breakdown in  this area at that time and some surrounding erythema that made me think  that there might be coexistent cellulitis here, I prescribed  doxycycline.  However, he promptly lost the prescription.  He did not  call us and he returns today in followup.   On examination, he is afebrile.  He does not describe any excruciating  discomfort here.  The area of erythema surrounding the wounds on the  left lateral leg is roughly static.  Therefore, I do not think that  there is any infection here.  This probably just represents inflammation  and venous stasis.  All of the wounds on the left lateral leg underwent  a difficult debridement of material (see debridement form).  The area on  the right medial ankle is essentially resolved at this point.   IMPRESSION:  Severe bilateral venous stasis disease now limited at the  left lateral ankle.  Extensive excisional debridements were done here.  This was to remove fibrous necrotic material, also subcutaneous tissue  was removed.  Bleeding was controlled with direct pressure.  I did not  sense any degree of cellulitis here.  I do not think he needs the  prescription.   We have reapplied Silverlon thick ABD pad under an Unna to the left leg.  The right leg will continue to undergo compression with an Unna.  We  will see him again in a week's time.           ______________________________  Maxwell Caul, M.D.     MGR/MEDQ  D:  08/05/2008  T:  08/06/2008  Job:   [id number]

## 2011-04-25 NOTE — Assessment & Plan Note (Signed)
[facility]   NAME:  [name], [name]               ACCOUNT NO.:  [id number]   MEDICAL RECORD NO.:  [id number]      DATE OF BIRTH:  1941-09-17   PHYSICIAN:  Maxwell Caul, M.D. VISIT DATE:  09/04/2008                                   OFFICE VISIT   [name] is a gentleman that we have been following most recently for  his severe bilateral venous stasis ulcers.  Initially, the problem area  was the medial aspect of the right leg, however, this has healed over  nicely and he has no open wounds on the right leg.  On the left leg, he  has several areas that remain most problematically, the medial malleolus  and 2 larger areas on the left lateral leg.  All of these, however,  appear to be doing very well.   PHYSICAL EXAMINATION:  His temperature is 97.7.  All of his wounds on  the left leg underwent excisional debridement to remove some eschar,  however, there appears to be a reasonable healing of all of these wound  areas.  To the areas in question, we will continue to use the Silverlon  and the Unna.  To the right leg which is largely healed, I prescribed  Tubigrip stockings which should give him 15 mm of compression and  protection of the underlying skin.   IMPRESSION:  Venous stasis ulcers.  We reapplied the Silverlon and  hydrogel under an Unna boot and a thick ABD pad which seems to be  causing regression of this wound.           ______________________________  Maxwell Caul, M.D.     MGR/MEDQ  D:  09/04/2008  T:  09/05/2008  Job:  [id number]

## 2011-04-25 NOTE — Assessment & Plan Note (Signed)
[facility]   NAME:  NAYEL, PURDY               ACCOUNT NO.:  [id number]   MEDICAL RECORD NO.:  [id number]      DATE OF BIRTH:  04/10/1941   PHYSICIAN:  Theresia Majors. Tanda Rockers, M.D. VISIT DATE:  05/22/2008                                   OFFICE VISIT   SUBJECTIVE:  Mr. Seeling is a 70 year old man who we had followed for  stasis ulcers over several weeks.  Currently, we are treating him with  bilateral Unna boots.  He continues to use sequential external leg  pumps.  There has been no interim pain or excessive drainage or malodor.  He continues to be ambulatory.   OBJECTIVE:  VITAL SIGNS:  Blood pressure is 155/86, respirations 20,  pulse rate 87, and temperature is 98.2.  Capillary blood glucose is 120  mg percent.  EXTREMITIES:  Inspection of the lower extremity shows that the ulcers on  the left lower extremity have resolved.  There continues to be 2+ edema  with moderate hyperemia and chronic stasis changes.  Wound #8 on the  right medial ankle is still present, but there is evidence of decrease  in volume and advancing epithelium from the periphery.  There is scant  drainage.  No debridement is needed.  Capillary refill is brisk  bilaterally.  The edema remains significant.   ASSESSMENT:  Chronic venous insufficiency with stasis ulceration.   PLAN:  We will continue bilateral Unna wraps with nursing visits weekly  and the physician to re-evaluate him in 2 weeks.      Harold A. Tanda Rockers, M.D.  Electronically Signed     HAN/MEDQ  D:  05/22/2008  T:  05/23/2008  Job:  [id number]

## 2011-04-25 NOTE — Assessment & Plan Note (Signed)
[facility]   NAME:  [name], [name]               ACCOUNT NO.:  [id number]   MEDICAL RECORD NO.:  [id number]      DATE OF BIRTH:  July 13, 1941   PHYSICIAN:  Maxwell Caul, M.D. VISIT DATE:  05/27/2009                                   OFFICE VISIT   HISTORY:  [name] is a 70 year old man with chronic and recurrent  varicose veins with inflammation and ulceration.  He returns today for  review of wounds predominately on his left lower extremity, although he  has developed a wound on the medial right leg as well.  Most recently,  he has been receiving Puracol AG, hydrogel under an Unna wrap.  He is  talking about the pain in his anterior legs related I think to  degenerative disk disease in his lumbar spine but does not describe  anything related to the wounds.   PHYSICAL EXAMINATION:  He has three wounds that we reviewed.  There are  two on the left lateral calf area.  Both of these appear to be clean and  uninfected.  They did not require debridement today, although they may  next week.  The new one is on his right medial ankle.  This was debrided  of some surface slough and necrotic tissue.  Underneath the base of this  appears to be clean.   IMPRESSION:  Bilateral venous stasis disease with a new wound on the  right medial ankle.  We went ahead and debrided the one on the right.  The two on the left were not debrided.  All of the wounds were dressed  with Puracol AG, hydrogel, dry dressing, and he was placed in bilateral  Unna.  We will see him again in a week.           ______________________________  Maxwell Caul, M.D.     MGR/MEDQ  D:  05/27/2009  T:  05/28/2009  Job:  [id number]

## 2011-04-25 NOTE — Assessment & Plan Note (Signed)
[facility]   NAME:  [name], [name]               ACCOUNT NO.:  [id number]   MEDICAL RECORD NO.:  [id number]      DATE OF BIRTH:  02-21-1941   PHYSICIAN:  Maxwell Caul, M.D. VISIT DATE:  07/31/2008                                   OFFICE VISIT   [name] returns today in followup for bilateral venous stasis disease.  We had been treating him with Silverlon to the left leg under an Unna  wrap and I changed him to [facility] on July 10, 2008.  He has since  had this changed in the [facility] here.  The original problem area  had been on the medial malleolus of the right leg.  However, this  improved dramatically.  However, he has also had a deterioration in the  lateral aspect of the left leg.  He has continued to use the Unna wraps,  however, he is having difficulties with drainage.  He also has a  compression pump at home.   PHYSICAL EXAMINATION:  He is afebrile.  There is a large amount of  drainage and maceration on the lateral aspect of the left leg and now  very significant wounds.  This is quite a deterioration from when I saw  this last time.  There is erythema around this and almost I wonder  somewhat about an allergic reaction to the [facility].   IMPRESSION:  Deterioration, venous stasis disease.  I did prescribe him  doxycycline 100 a day, although I really do not think this is a primary  cellulitis.  I have discontinued the Hydrofera Blue and reapplied  Silverlon ABD pads to the large problematic area and we will rewrap them  in an Unna.  We also applied Silverlon to a small area over the medial  malleolus.  The original area on the right medial ankle has resolved and  I have declared it as such.  We will see him again next Wednesday to  follow up on the area on the left leg on doxycycline.           ______________________________  Maxwell Caul, M.D.     MGR/MEDQ  D:  07/31/2008  T:  08/01/2008  Job:  [id number]

## 2011-04-25 NOTE — Assessment & Plan Note (Signed)
[facility]   NAME:  [name], [name]               ACCOUNT NO.:  [id number]   MEDICAL RECORD NO.:  [id number]      DATE OF BIRTH:  Mar 05, 1941   PHYSICIAN:  Theresia Majors. Tanda Rockers, M.D. VISIT DATE:  10/09/2007                                   OFFICE VISIT   SUBJECTIVE:  [name] is a 70 year old man whom we have followed for  stasis ulcerations.  In the interim he has used both the Unna wrap on  the left lower extremity and continues to use the sequential leg pumps.  There is been no interim drainage malodor pain or fever.   OBJECTIVE:  Blood pressure is 152/79, respirations 18, pulse rate 86,  temperature is 98.  Capillary blood glucose is 152 mg percent.  Inspection of the lower extremity shows that the ulcer continues to  decrease.  There is no evidence of excessive drainage, malodor, or  infection.  The capillary refill is brisk with palpable pulses.   ASSESSMENT:  Clinical improvement.   PLAN:  We will continue the sequential leg pump and we will reapply the  Unna wrap to the left lower extremity.  We will reevaluate the patient  in 1 week.      Harold A. Tanda Rockers, M.D.  Electronically Signed     HAN/MEDQ  D:  10/09/2007  T:  10/09/2007  Job:  [id number]

## 2011-04-25 NOTE — Assessment & Plan Note (Signed)
[facility]   NAME:  [name], [name]               ACCOUNT NO.:  [id number]   MEDICAL RECORD NO.:  [id number]      DATE OF BIRTH:  [birth year]   PHYSICIAN:  Jonelle Sports. Sevier, M.D.  VISIT DATE:  01/06/2009                                   OFFICE VISIT   This 70 year old white male is followed for stasis ulcerations  originally on both lower extremities, but now confined to the left lower  extremity.  He has returned to compression stocking on the right and has  been in Profore wrap on the left.  He is doing slowly progressive  progress, but still has tendency to accumulate slough in these wound  bases.   Since last seen here a week ago, he has had no particular problems.  No  increase in pain, drainage, or odor and no fever or systemic symptoms.  On exam, blood pressure 151/79, pulse 108, respirations 20, and  temperature 97.6.  Random capillary glucose 124 mg/dL.  He now has two  persisting wounds on the medial aspect of his left lower extremity and  two on the lateral aspect.  The sizes of those are recorded in his chart  and are essentially unchanged or minimally improved from 1 week ago.   The two wounds on the medial aspect of his ankle contained some crusting  around the margins and small amount of slough in the bases.  Those on  the lateral aspect of the ankle contain considerable filmy slough  throughout the entirety of their bases.  There is no surrounding  erythema, nothing to suggest spreading infection.   It is noted on the left lower extremity that there is some horizontal  hemorrhagic stripes associated with overlap in the Profore wrap.   IMPRESSION:  Satisfactory coarse stasis ulcerations, left lower  extremity.   DISPOSITION:  The wounds are debrided of this slough as previously  described.  All four wounds so debrided giving a collective total of  greater than 20 sq cm.  This is well tolerated under 5% topical  lidocaine gel.  The wounds are  then dressed with Aquacel Ag, covered  with Sofsorb pads and he is returned to Profore wrap.  I have asked the  nurse to reinforce those areas where the previous streaking was seen  with ABD pads to try to cut down on the trauma rendered by the wrap  itself.   Followup visit will be in 1 week.           ______________________________  Jonelle Sports. Cheryll Cockayne, M.D.     RES/MEDQ  D:  01/06/2009  T:  01/07/2009  Job:  [id number]

## 2011-04-25 NOTE — Assessment & Plan Note (Signed)
[facility]   NAME:  DALEON, WILLINGER               ACCOUNT NO.:  [id number]   MEDICAL RECORD NO.:  [id number]      DATE OF BIRTH:  January 27, 1941   PHYSICIAN:  Jonelle Sports. Sevier, M.D.  VISIT DATE:  11/04/2008                                   OFFICE VISIT   HISTORY:  This 70 year old white male is followed for chronic venous  stasis ulcerations intermittently involving both lower extremities.  Most recently, he has been under a Profore wrap on the left with Puracol  AG applied to a number of unhealed lesions there.   He had had no open lesions on his right extremity and that was being  treated with a Tubigrip.   He has heavy drainages and then followed at home with dressing changes 3  times a week by Home Health between his visits here.  He was last seen  here 2 weeks ago.   The patient reports no awareness of any new problems since his last  visit here and most specifically he has had no increase in odor, pain,  fever, or systemic symptoms.   PHYSICAL EXAMINATION:  VITAL SIGNS:  Blood pressure is 108/76, pulse 75,  respirations 16, and temperature 97.7.   The patient has 4 active wounds on the left lower extremity now #11,  #14, #17, and #18.  These are described in detail in the nurse's notes  and those details will not be repeated here.  All except #14 are covered  with pretty significant slough.  The #14 is more superficial and has a  fairly acceptable base.   In addition on the right lower extremity at the medial malleolar area is  now a tiny open ulcer which again is described in the nurse's notes in  detail.  It has a clean base and should be expected to do well.   IMPRESSION:  1. Multiple venous stasis ulcerations, left lower extremity,      unfortunately fairly static.  2. New stasis ulceration, medial malleolar area, right lower      extremity.   DISPOSITION:  Wounds #11, #17, and #18 are debrided selectively through  the use of curette to remove  a great deal of slough from the basis.  Following this, the bases are fairly clean and the wounds look capable  of healing.   The new wound and the wound #14 are both covered with Silverlon patches  and wounds #11, #17, and #18 are covered with Puracol AG.  The entire  area was then covered with absorptive pads and both extremities were  placed in bilateral Profore wraps.   UltraMide 25 was used fairly widely to dry skin on the left lower  extremity and the patient was given a supply of this to take home for  use there by the Home Health nurses.   The patient will be seen by [facility] nurses for dressing changes 3  times weekly on Monday, Wednesday, and Friday exactly as above.   His followup visit will be here in 3 weeks.           ______________________________  Jonelle Sports Cheryll Cockayne, M.D.     RES/MEDQ  D:  11/04/2008  T:  11/04/2008  Job:  [id number]

## 2011-04-28 NOTE — Assessment & Plan Note (Signed)
[facility]   NAME:  [name], [name]               ACCOUNT NO.:  [id number]   MEDICAL RECORD NO.:  [id number]           DATE OF BIRTH:   PHYSICIAN:  Theresia Majors. Tanda Rockers, M.D. VISIT DATE:  01/23/2007                                   OFFICE VISIT   VITAL SIGNS:  Blood pressure is 135/79, respirations 22, pulse rate 92,  temp of 98.3.   PURPOSE OF TODAY'S VISIT:  [name] is a 70 year old man who we have  been following for a stasis ulcer involving his left lateral extremity.  In the interim, he has been treated with compression wraps, serial  debridements, he has undergone Apligraf, as well as matrix dressings  subsequent to the Apligraf.  He returns complaining of some moderate  drainage, but there has been no pain.  He reports a new injury on the  lateral aspect of his right foot.   WOUND EXAM:  Inspection of the lower extremity shows that the edema is  reasonably controlled with the compression.  There is 1+ edema on the  left lower extremity.  The ulcer has areas of necrosis centrally with  advancing epithelium.  There is a near epithelial bridge in the center  of the wound.  A full thickness debridement was performed with removal  of nonviable tissue and a minimum amount of hemorrhage elicited and  controlled with direct pressure.  Wound number 2A has completely  resolved.  Wound number 3 is a new wound on the lateral right foot.  This appears to be a blister, which has spontaneously ruptured.  It is  clean.  There is no evidence of active infection.   DIAGNOSIS:  Clinical improvement of stasis ulcer with a new friction  blister on the right lateral foot.   MANAGEMENT PLAN & GOAL:  We will resume Prisma with an Unna wrap to the  left lower extremity.  We have instructed the patient in the use of  antiseptic wash, topical Neosporin and a clean white sock for the new  wound number 3.  We will reevaluate him in 1 week.     ______________________________  Theresia Majors. Tanda Rockers, M.D.     [name]  D:  01/23/2007  T:  01/24/2007  Job:  [id number]

## 2011-04-28 NOTE — Assessment & Plan Note (Signed)
[facility]   NAME:  [name], [name]               ACCOUNT NO.:  [id number]   MEDICAL RECORD NO.:  [id number]           DATE OF BIRTH:   PHYSICIAN:  Theresia Majors. Tanda Rockers, M.D. VISIT DATE:  05/14/2007                                   OFFICE VISIT   SUBJECTIVE:  [name] is a 70 year old man who we have followed for  bilateral stasis ulcers.  In the interim, we have placed him in  compression wraps.  He continues to be ambulatory.  He denies interim  pain, fever or excessive drainage.   OBJECTIVE:  Blood pressure is 134/72, respirations are 18, pulse rate  95, temperature 98.  Capillary blood glucose is 145 mg%.  The patient is  unattended.  Inspection of the lower extremity shows a persistence of 1+  edema with mild hyperemia.  The ulcers, themselves, show 100%  granulating bases with advance in epithelium from the periphery.  There  are well adherent eschars on both the wound number 1 and 4.  There is no  evidence of ischemia or infection.   ASSESSMENT:  Clinical improvement of stasis ulcers.   PLAN:  We will return the patient to the Unna wraps and reevaluate him  in 1 week.      Harold A. Tanda Rockers, M.D.  Electronically Signed     HAN/MEDQ  D:  05/14/2007  T:  05/14/2007  Job:  [id number]

## 2011-04-28 NOTE — Assessment & Plan Note (Signed)
[facility]   NAME:  [name], [name]               ACCOUNT NO.:  [id number]   MEDICAL RECORD NO.:  [id number]      DATE OF BIRTH:  1941/06/18   PHYSICIAN:  Theresia Majors. Tanda Rockers, M.D. VISIT DATE:  10/08/2006                                     OFFICE VISIT   VITAL SIGNS:  Blood pressure is 140/80, respirations 18, pulse rate 74, and  he is afebrile.  Capillary blood glucose is 138 mg%.   PURPOSE OF TODAY'S VISIT:  [name] returns for a followup of his stasis  ulcer involving his left lower extremity.  During the interim, he has been  treated with a compression wrap.  He reports that there has been less  malodor and less drainage.  He denies fever or excessive pain.   WOUND EXAM:  Inspection of the lower extremity shows a persistence of  chronic stasis changes with hyperemia.  There is marked amount of  desquamation and necrosis.  A #10 blade was used to perform a full thickness  debridement.  Hemorrhage was controlled with direct pressure.  Anasept wash  followed by Anasept gel was applied and thereafter a multilevel compression  wrap applied.   DIAGNOSIS:  Improvement of stasis ulcer.   MANAGEMENT PLAN & GOAL:  We will continue compression therapy and reevaluate  the patient in 4 days.           ______________________________  Theresia Majors Tanda Rockers, M.D.     [name]  D:  10/08/2006  T:  10/08/2006  Job:  [id number]

## 2011-04-28 NOTE — Assessment & Plan Note (Signed)
[facility]   NAME:  [name], [name]               ACCOUNT NO.:  [id number]   MEDICAL RECORD NO.:  [id number]          DATE OF BIRTH:   PHYSICIAN:  Theresia Majors. Tanda Rockers, M.D. VISIT DATE:  12/05/2006                                   OFFICE VISIT   VITAL SIGNS:  Blood pressure is 140/90, respirations are 20, pulse rate  78, he is afebrile.   PURPOSE OF TODAY'S VISIT:  [name] returns for followup on an Apligraf  placed on his left lower extremity 10 days ago.  In the interim, the  patient has had moderate drainage, but no pain and no fever.  He also  has a stasis ulcer on the medial aspect of the right lower extremity.   WOUND EXAM:  The inspection of the lower extremity shows that the  drainage has decreased.  We removed the Mepitel dressing disclosing the  graft tape.  There is evidence of epithelium advancing from the  periphery of the wound.   DIAGNOSIS:  Improvement following Apligraf application.   MANAGEMENT PLAN & GOAL:  We have replaced the patient in bilateral  Profore Unna wraps to provide fluid control in both lower extremities.  We will reevaluate him in 1 week.           ______________________________  Theresia Majors. Tanda Rockers, M.D.     [name]  D:  12/05/2006  T:  12/06/2006  Job:  [id number]

## 2011-04-28 NOTE — Assessment & Plan Note (Signed)
[facility]   NAME:  [name], [name]               ACCOUNT NO.:  [id number]   MEDICAL RECORD NO.:  [id number]      DATE OF BIRTH:  1941/05/10   PHYSICIAN:  Maxwell Caul, M.D.      VISIT DATE:                                     OFFICE VISIT   VITAL SIGNS:  Temperature 98.2.  Pulse 78.  Respirations 22.  Blood pressure  124/80.   PURPOSE OF TODAY'S VISIT:  Returned for follow-up of severe left lower  extremity venostasis wound.   WOUND EXAM:  Inspection of the lower extremity shows that stasis changes and  swelling are still present.  The ulcers themselves have good granulating  basis; however, there is no evidence of epithelialization.  There are areas  of slight necrosis, and there is still a large amount of drainage.  He has  been on silver select and an Nurse, learning disability as of last visit.  Peripheral  pulses seem adequate.   WOUND SINCE LAST VISIT:  There is probably less drainage and less evidence  of bioburden.  We will continue him in the silver select.  I was not  convinced the [facility] boot was giving [place] good compression; therefore, I have  switched him back to a ProFor today.  I have discussed with him the reasons  for this, and he is to return or call if there is any pain or excessive  discomfort.   MANAGEMENT PLAN & GOAL:  Silver select and ProFor wrap.  We will see him  again in 5 days' time, as the drainage continues to be too excessive to  extend his visit interval.           ______________________________  Maxwell Caul, M.D.     MGR/MEDQ  D:  09/07/2006  T:  09/09/2006  Job:  [id number]

## 2011-04-28 NOTE — Assessment & Plan Note (Signed)
[facility]   NAME:  [name], CHEEVER               ACCOUNT NO.:  [id number]   MEDICAL RECORD NO.:  [id number]      DATE OF BIRTH:  07-24-1941   PHYSICIAN:  Theresia Majors. Tanda Rockers, M.D. VISIT DATE:  02/05/2007                                   OFFICE VISIT   VITAL SIGNS:  Blood pressure is 136/99, respirations are 20, pulse rate  94.  He is afebrile.   PURPOSE OF TODAY'S VISIT:  [name] is a 70 year old man who we  followed for a stasis ulcer involving his left lateral lower extremity  and also an early stasis ulcer on the lateral aspect of the right lower  extremity.  In the interim, we have treated him with Iodosorb over the  right lower extremity ulceration and an Unna wrap on the left lower  extremity.  He denies excessive pain, malodor, or drainage.   WOUND EXAM:  Inspection of the lower extremity shows that on the right  lateral foot the wounds have decreased in drainage.  Under EMLA block, a  full-thickness debridement was performed with a 10 blade with hemorrhage  controlled with direct pressure.  There appears to be clean granulation  tissue and a complete removal of nonviable tissue was accomplished.  Wound number 1 on the lateral left lower extremity underwent a complete  full-thickness debridement under EMLA block also.  A 10 blade was used,  and hemorrhage was controlled with direct pressure.  This wound shows  evidence of continued improvement.  There is now an epithelial bridge  bisecting the wound into two separate parts.  There is no evidence of  active infection.  There remains 2+ edema.  The ankle brachial index is  1.1 and 1.3 in the left and right, respectively.  There is no evidence  of vascular compromise.   DIAGNOSIS:  Slow response for stasis ulcer following the initial  Apligraf.   MANAGEMENT PLAN & GOAL:  We will precertify the patient for a second  application of Apligraf.  In the interim, we will continue the  compression wrap and  Iodosorb.  Follow up in one week.           ______________________________  Theresia Majors. Tanda Rockers, M.D.     [name]  D:  02/05/2007  T:  02/05/2007  Job:  [id number]

## 2011-04-28 NOTE — Assessment & Plan Note (Signed)
[facility]   NAME:  [name], [name]               ACCOUNT NO.:  [id number]   MEDICAL RECORD NO.:  [id number]      DATE OF BIRTH:  21-Apr-1941   PHYSICIAN:  Theresia Majors. Tanda Rockers, M.D. VISIT DATE:  12/19/2006                                   OFFICE VISIT   VITAL SIGNS:  Blood pressure is 180/83, pulse rate of 100, respirations  16, temperature 97.7.  Capillary blood glucose is a 138 mg percent.   PURPOSE OF TODAY'S VISIT:  Mr. Minehart returns for followup of a left  stasis ulcer.  During the interim, he reports that there has been  continued drainage, but no excessive pain or fever.   WOUND EXAM:  Inspection of the left lower extremity shows that the wound  appears to be clean and healthy.  There is 100% granulation with new and  completely circumferential advancement of epithelium.   DIAGNOSIS:  Continued improvement of stasis ulcer.   MANAGEMENT PLAN & GOAL:  We will return the patient to an Unna boot with  a soft sorb and reevaluate him in 1 week.  We have also encouraged him  to wear the support hose on the right lower extremity to avoid  recurrence of the previously noted ulceration.           ______________________________  Theresia Majors Tanda Rockers, M.D.     [name]  D:  12/19/2006  T:  12/19/2006  Job:  [id number]

## 2011-04-28 NOTE — Assessment & Plan Note (Signed)
[facility]   NAME:  AMORE, GRATER               ACCOUNT NO.:  [id number]   MEDICAL RECORD NO.:  [id number]           DATE OF BIRTH:   PHYSICIAN:  Theresia Majors. Tanda Rockers, M.D. VISIT DATE:  01/16/2007                                   OFFICE VISIT   VITAL SIGNS:  Blood pressure is 148/80, respiration is 20, pulse rate  94, temperature 97.8.  Capillary blood glucose is 138 mg%.   PURPOSE OF TODAY'S VISIT:  Mr. Borman returns for a followup of a stasis  ulcer in both lower extremities.  In the interim, he has been treated  with compression wraps.  He reports that there has been a moderate  increase in drainage on the left lower extremity but denies excessive  pain, malodor, or fever.   WOUND EXAM:  Inspection of the lower extremity shows that there is a dry  eschar surrounding the improved-appearing stasis ulcer on the right  medial malleolus.  There is no excessive drainage, and there is no  malodor.  On the left lower extremity, there is a moist waxy exudate  associated with necrotic tissue.  The compression has been reasonably  well, tolerated with linear wrinkles in the skin with chronic changes of  stasis.  There is no cellulitis and no lymphangitis.  The left lower leg  wound underwent a full-thickness debridement with a 10 blade with  hemorrhage control with direct pressure.  A culture was taken following  the debridement.   DIAGNOSIS:  Static ulceration left lower extremity, improved wound on  the right lower extremity.   MANAGEMENT GOAL & PLAN:  We will cleanse with antisept wash, apply  antisept gel, and continue bilateral Unna wraps for compression.  We  will reevaluate the patient in 1 week.           ______________________________  Theresia Majors. Tanda Rockers, M.D.     [name]  D:  01/16/2007  T:  01/17/2007  Job:  [id number]

## 2011-04-28 NOTE — Assessment & Plan Note (Signed)
[facility]   NAME:  [name], [name]               ACCOUNT NO.:  [id number]   MEDICAL RECORD NO.:  [id number]      DATE OF BIRTH:  October 28, 1941   PHYSICIAN:  Theresia Majors. Tanda Rockers, M.D. VISIT DATE:  04/26/2007                                   OFFICE VISIT   SUBJECTIVE:  [name] is a 70 year old man who we are following for  bilateral lower extremity stasis ulcers.  In the interim we have treated  him with Unna wraps.  He presents for reevaluation.  He denies interim  excessive drainage, malodor, pain or fever.   OBJECTIVE:  Blood pressure is 134/92, respirations 24, pulse rate 94,  temp is 97.9.  Capillary blood glucose is 139 mg percent.  Inspection of  the lower extremity shows that the ulcer on the lateral aspect of the  right lower extremity has resolved.  Wounds #1 and #4 show dramatic  decrease in volume.  There is a new wound #6 on the right lower  extremity in the anteromedial aspect which is related to trauma.  This  is a small wound.  All of the active wounds were numbered, photographed  and catalogued.  There is no evidence of active infection.  There is no  evidence of ischemia.   ASSESSMENT:  Improved stasis ulcer.   PLAN:  Will return the patient to an Radio broadcast assistant and reevaluate him in one  week.      Harold A. Tanda Rockers, M.D.  Electronically Signed     HAN/MEDQ  D:  04/26/2007  T:  04/27/2007  Job:  [id number]

## 2011-04-28 NOTE — Assessment & Plan Note (Signed)
[facility]   NAME:  [name], [name]               ACCOUNT NO.:  [id number]   MEDICAL RECORD NO.:  [id number]      DATE OF BIRTH:  25-Dec-1940   PHYSICIAN:  Maxwell Caul, M.D.      VISIT DATE:                                     OFFICE VISIT   PURPOSE OF VISIT:  Followup severe left leg venous stasis wounds.   VITAL SIGNS:  The temperature is 92.7, pulse 60, respirations 20, blood  pressure 120/98.   WOUND EXAM:  There is continued large areas of venous stasis on the left  anterior lateral predominantly of his left leg.  There continues to be a  severe amount of drainage.  There are several islands of this wound.  Some  of them are almost completely epithelialized, the ones that are more medial  have not epithelialized at all, however, there is fairly good granulation  tissue present here.  We have continued with the select silver and Profore  wraps.  He did have a culture done on his last visit.  This is being re  intubated for better growth.   WOUND CHANGE SINCE LAST VISIT:  I think there is continued improvement in  epithelialization, however, there continues to be a large amount of  drainage.  There is no significant systemic or soft tissue infection.  We  have continued the select silver with Profore's and TCA cream.  We will see  him again in.   On next visit, I would like to consider some debridement as there does  appear to be some fluff with bio burden present.  He will need lidocaine gel  applied if we are going to do this expediently.           ______________________________  Maxwell Caul, M.D.     MGR/MEDQ  D:  09/21/2006  T:  09/22/2006  Job:  [id number]

## 2011-04-28 NOTE — Assessment & Plan Note (Signed)
[facility]   NAME:  [name], [name]               ACCOUNT NO.:  [id number]   MEDICAL RECORD NO.:  [id number]      DATE OF BIRTH:  December 19, 1940   PHYSICIAN:  Theresia Majors. Tanda Rockers, M.D. VISIT DATE:  10/24/2007                                   OFFICE VISIT   SUBJECTIVE:  [name] is a 70 year old man who we have followed for  bilateral stasis.  He is continuing to use the sequential leg pump at  home.  On his last visit, he was placed in Profore wraps. He reports  that there has been no excessive drainage but his wraps did fall down 3  days ago.   OBJECTIVE:  Blood pressure is 141/81, respirations 16, pulse rate 89,  temperature is 98.1.  Inspection of the lower extremity shows that there are bilateral wrap  injuries with subcutaneous ecchymosis.  There is moderate persistent  edema.  There is no concurrent changes of ischemia.  The wounds on the  left lower extremity show no significant change from the previous exam.  Wound 2C on the right medial distal lower extremity has increased  slightly.  All three wounds were measured, photographed and cataloged.  Please refer to the data entries.   ASSESSMENT:  Persistent stasis, no evidence of active infection or  ischemia.   PLAN:  We will resume bilateral Unna boots with care paid to the  technique of application.  We will add triamcinolone ointment.  We will  reevaluate the patient in 1 week.      Harold A. Tanda Rockers, M.D.  Electronically Signed     HAN/MEDQ  D:  10/24/2007  T:  10/25/2007  Job:  [id number]

## 2011-04-28 NOTE — Assessment & Plan Note (Signed)
[facility]   NAME:  GIANKARLO, LEAMER               ACCOUNT NO.:  [id number]   MEDICAL RECORD NO.:  [id number]           DATE OF BIRTH:   PHYSICIAN:  Theresia Majors. Tanda Rockers, M.D. VISIT DATE:  04/11/2007                                   OFFICE VISIT   SUBJECTIVE:  Mr. Constante is a 70 year old man who we have followed for  stasis ulceration involving his left lower extremity.  In the interim,  he has worn bilateral Unna wraps.  He denies excessive drainage,  malodor, pain or fever.   OBJECTIVE:  Blood pressure is 144/75, respirations 20, pulse rate is 96,  temperature is 97.6.  Inspection of the wounds shows that there has been  advancement in epithelium in spite of some areas of necrosis on the left  lateral ulceration.  A full thickness debridement was performed to the  nonviable tissue without difficulty.  Similarly, the right medial wound  has an area of breakdown with necrosis.  This underwent a full thickness  debridement with hemorrhage control with direct pressure.  There is  bilateral 2+ edema, mild hyperemia, but no evidence of frank infection.  There is no evidence of ischemia.   ASSESSMENT:  Clinical improvement of stasis ulcers to compression  therapy.   PLAN:  We will return the patient to Unna boots and we will reevaluate  him in 1 week.      Harold A. Tanda Rockers, M.D.  Electronically Signed     HAN/MEDQ  D:  04/11/2007  T:  04/11/2007  Job:  [id number]

## 2011-04-28 NOTE — Assessment & Plan Note (Signed)
[facility]   NAME:  COSTON, MANDATO               ACCOUNT NO.:  [id number]   MEDICAL RECORD NO.:  [id number]          DATE OF BIRTH:   PHYSICIAN:  Maxwell Caul, M.D. VISIT DATE:  10/12/2006                                     OFFICE VISIT   PURPOSE OF TODAY'S VISIT:  Followup severe venous stasis changes in his left  leg.   Mr. Omahoney has been followed here for continued problems with venous stasis  of his left, much greater than his right leg.  In the interim, he has had no  fever.  He does describe multiple types of pain, including what sounds like  a diabetic neuropathic pain in his toes, also he might have a radicular pain  as he has been to see Dr. Newell Coral in the past, probably with what almost  sounds like sciatica.   WOUND EXAM:  Inspection of the left lower extremity shows that there is mild  persistence of edema; however, there continues to be improvement in all of  these wounds.  Some parts of this are fully epithelized now.  All of them  are granulated.  They appear to be generally decreasing in size by my mind's  eye, although this would be difficult to gage quantitatively due to the  different islands of this wound surface.   WOUND CARE PLAN & FOLLOWUP:  We will continue with the Anasept wash and gel  and a Profore wrap.  This continues to look remarkably better and I think we  are well on our way to healing.           ______________________________  Maxwell Caul, M.D.     MGR/MEDQ  D:  10/12/2006  T:  10/13/2006  Job:  [id number]

## 2011-04-28 NOTE — Assessment & Plan Note (Signed)
[facility]   NAME:  [name], [name]               ACCOUNT NO.:  [id number]   MEDICAL RECORD NO.:  [id number]           DATE OF BIRTH:   PHYSICIAN:  Theresia Majors. Tanda [name], M.D. VISIT DATE:  04/04/2007                                   OFFICE VISIT   VITAL SIGNS:  Blood pressure is 142/83, respirations 20, pulse rate 90,  temperature is 97.   PURPOSE OF TODAY'S VISIT:  [name] is a 70 year old man who we are  seeing for bilateral stasis ulcers associated with edema.  We have  treated him in the interim with Aquacel and bilateral Unna wraps.  In  the interim, he denies excessive drainage, malodor, pain or fever.   WOUND EXAM:  Inspection of the lower extremity shows a persistence of 2+  edema.  The ulcers, themselves, all appear to be decreased in volume.  Desquamation was mechanically debrided with a moist 4 x 4 without  difficulty and no pain.  There is no evidence of infection.  There is no  evidence of ischemia.   DIAGNOSIS:  Clinical improvement of stasis ulcers.   MANAGEMENT PLAN & GOAL:  We will return the patient to Unna wraps and  reevaluate him in 1 week.      [name], M.D.  Electronically Signed     HAN/MEDQ  D:  04/04/2007  T:  04/04/2007  Job:  [id number]

## 2011-04-28 NOTE — Assessment & Plan Note (Signed)
[facility]   NAME:  [name], [name]               ACCOUNT NO.:  [id number]   MEDICAL RECORD NO.:  [id number]      DATE OF BIRTH:  1941-04-10   PHYSICIAN:  Maxwell Caul, M.D.      VISIT DATE:                                   OFFICE VISIT   VITAL SIGNS:  The patient is afebrile with a temperature of 98.1.   PURPOSE OF TODAY'S VISIT:  Follow up bilateral venous stasis disease  with a persistent ulcer on the left leg.   WOUND EXAM:  We do not have good edema control.  In fact, there even  appears to be edema in his areas of epithelialization.  On the medial  aspect of the right lower extremity, there is a dry-appearing ulcer.   WOUND SINCE LAST VISIT:  In the interim, he has worn an Unna wrap.  I  have changed this to Profore on the left; I will put Prisma on the major  venous stasis wound on the left and bilateral Profores.   DIAGNOSES:  Severe venous stasis with bilateral ulcerations.   TREATMENT:  We have placed bilateral Profores with Prisma over the main  wound on the left.   TISSUE DEBRIDED:  No debridement was necessary.  As mentioned, on the  left leg I do not think we have great edema control with the current  Unna boot wrap.   MANAGEMENT PLAN & GOAL:  We will see him again in one week's time.  He  is to see his primary physician to aid in edema control.   Dictation ended at this point.           ______________________________  Maxwell Caul, M.D.     MGR/MEDQ  D:  11/12/2006  T:  11/13/2006  Job:  [id number]

## 2011-04-28 NOTE — Assessment & Plan Note (Signed)
[facility]   NAME:  [name], [name]               ACCOUNT NO.:  [id number]   MEDICAL RECORD NO.:  [id number]      DATE OF BIRTH:  [birth year]   PHYSICIAN:  Theresia Majors. Tanda Rockers, M.D. VISIT DATE:  08/31/2006                                     OFFICE VISIT   VITAL SIGNS:  He is 6 feet 3 inches, weighs 340 pounds.  The blood pressure  is 130/80.  Respirations 22.  Pulse rate 72 and he is afebrile.  Capillary  blood glucose is 134 mg percent.   PURPOSE OF TODAY'S VISIT:  [name] is a 70 year old man referred by Dr.  Orson Slick for evaluation and management of an extreme stasis ulcer on his left  lower extremity.  His primary care physician is Dr. Idell Pickles. We have  instituted multilayer compression therapy utilizing a select Silver product.  We will need to frequently change this dressing due to the magnitude of  exudate.   [name] is a 70 year old, diabetic who has had difficulty with venous  insufficiency over the years.  He has undergone bilateral saphenous vein  stripping and has recently had vein injections per [facility].  In spite of these modalities, he has had a cyclical breakdown of skin, which  has progressed to stasis ulcers.  Throughout the years, he has been treated  with external compression hose and elevation with varying degrees of success  which have been interrupted by his recurrences.   His current medication list includes:  1. Diovan HCT 80 mg daily.  2. Insulin NovoLog, 70/30, 48 units in the morning, 42 in the evening.  3. Norvasc.  4. Lipitor 80 mg daily.  5. Niaspan 1 a day.  6. Lexapro 10 mg daily.  7. Wellbutrin daily.  8. Atarax 50 b.i.d.  9. Tramadol.   He denies allergies.   His family history is positive for cancer, stroke, and heart attack as well  as diabetes.  Socially, he is married.  His children are grown; one lives in  the local area.  His wife has had breast cancer and has recently been  diagnosed as  having multiple sclerosis.  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   The review of systems is remarkable for his stable weight.  He denies GI, GU  complaints.  He, specifically, denies angina pectoris and he does not smoke.  The remainder of the review of systems is negative.   He is alert, oriented, in good contact with reality and answers questions  appropriately.  HEENT:  Clear.  NECK:  Supple.  Trachea is midline.  Thyroid is nonpalpable.  LUNGS:  Clear.  The heart sounds are distant.  The abdomen is protuberant with no appreciable masses.  The pedal pulses are faintly palpable.  Neurologically, there is decreased sensation.   WOUND EXAM:  On the right lower extremity, there is a healed stasis ulcer on  the medial aspect of the right lower extremity.  On the left lower  extremity, there are chronic and far advanced changes of stasis.  There is  hyperemia.  There is excessive weeping with a thick pasty exudate which has  been partially debrided.  The ulcers themselves are associated with both  intense  erythema, cellulitis, induration, and full-thickness skin defect.  These wounds were irrigated with a wound wash product.  They are moderately  tender.  The 3+ edema is self evident.  The pencil Doppler shows polyphasic  signals in the pedal pulses with some obliteration due to hyperdynamic  venous signs.  Capillary refill is normal.  A  was placed on the left lower  extremity utilizing the unidirectional select Silver product.   WOUND SINCE LAST VISIT:   CHANGE IN INTERVAL MEDICAL HISTORY:   DIAGNOSIS:  Severe bilateral stasis with a severe stasis ulcer on the left  lower extremity.   TREATMENT:   ANESTHETIC USED:   TISSUE DEBRIDED:   LEVEL:   CHANGE IN MEDS:   COMPRESSION BANDAGE:   OTHER:   MANAGEMENT PLAN & GOAL:  Discussion:  This far advanced stasis ulcer will  require more frequent dressing changes.  We have explained that approach to  the patient in  terms that he understands our initial approach is to decrease  the amount of secretions and the potential of bacterial load by utilizing  the Silver product.  Once we have cleared this wound of the associated  debris, we will extend his periods in the multiple multilayer wraps.  The  patient seems to understand and appreciate the potential difficulties  related to this rather large stasis ulcer diaphysis.           ______________________________  Theresia Majors Tanda Rockers, M.D.     [name]  D:  08/31/2006  T:  09/03/2006  Job:  [id number]   cc:   Lebron Conners, M.D.  Dellis Anes Idell Pickles, M.D.

## 2011-04-28 NOTE — Assessment & Plan Note (Signed)
[facility]   NAME:  [name], [name]               ACCOUNT NO.:  [id number]   MEDICAL RECORD NO.:  [id number]           DATE OF BIRTH:   PHYSICIAN:  Theresia Majors. Tanda Rockers, M.D. VISIT DATE:  12/13/2006                                   OFFICE VISIT   VITAL SIGNS:  Blood pressure is 136/86, respirations 18, pulse rate 62  and temperature 98.2.  Capillary blood glucose is 146 mg percent.   PURPOSE OF TODAY'S VISIT:  [name] returns for followup of a stasis  ulcer on the left lower extremity and a medial ulcer on the right lower  extremity.  In the interim, we have treated him with an Radio broadcast assistant.   WOUND EXAM:  Inspection of the lower extremity shows that there are  bilateral changes of stasis with desquamation.  The medial ulcer on the  right lower extremity is completely resolved.  On the left lower  extremity, the wound that was previously treated with an Apligraf is  showing dramatic decrease in volume.  There is scant drainage.  There is  100% granulation with advancement of epithelium from the periphery.  No  debridement is needed.   DIAGNOSES:  1. Improvement in the stasis ulcer.  2. Resolution of stasis ulcer.   MANAGEMENT PLAN & GOAL:  We will reapply an Unna boot to the left lower  extremity and reevaluate the patient in 10 days.           ______________________________  Theresia Majors Tanda Rockers, M.D.     [name]  D:  12/13/2006  T:  12/13/2006  Job:  [id number]

## 2011-04-28 NOTE — Assessment & Plan Note (Signed)
[facility]   NAME:  [name], [name]               ACCOUNT NO.:  [id number]   MEDICAL RECORD NO.:  [id number]      DATE OF BIRTH:  1941/10/22   PHYSICIAN:  Theresia Majors. Tanda Rockers, M.D.      VISIT DATE:                                     OFFICE VISIT   VITAL SIGNS:  Blood pressure 120/80, respirations 18, pulse rate 76, and he  is afebrile.  Capillary blood glucose is 153 mg per cent.   PURPOSE OF TODAY'S VISIT:  [name] is a 70 year old man who we have been  following for a stasis ulceration involving his left lower extremity.   WOUND EXAM:  Inspection of the left lower extremity shows that the stasis  ulcer has decreased in volume.  There remains a 100% granulation with  advancement of epithelium from all areas of this conference.  On the medial  right lower extremity, there is a punctate ulceration with healthy appearing  granulation.  No debridement was necessary for either wound.   WOUND SINCE LAST VISIT:  In the interim, he has worn a Unna wrap.  He has  also developed a secondary ulceration on the medial aspect of the right  lower extremity.  He denies pain, ever, there is some malodor.  He denies  cardio-respiratory symptoms.   DIAGNOSIS:  Severe fluid retention, stasis and ulceration bilaterally.   TREATMENT:  We placed the patient in bilateral Unna wraps.   MANAGEMENT PLAN & GOAL:  We will re evaluate him in 1 week.  The patient has  been encouraged to make an appointment with his primary care physician for  readjustment of his medications,  specifically his diuretic therapy.  The  patient seems to understand and indicates that he will be compliant.           ______________________________  Theresia Majors. Tanda Rockers, M.D.     [name]  D:  11/05/2006  T:  11/05/2006  Job:  [id number]

## 2011-04-28 NOTE — Assessment & Plan Note (Signed)
[facility]   NAME:  [name], [name]               ACCOUNT NO.:  [id number]   MEDICAL RECORD NO.:  [id number]      DATE OF BIRTH:  [birth year]   PHYSICIAN:  Theresia Majors. Tanda Rockers, M.D. VISIT DATE:  11/19/2006                                   OFFICE VISIT   VITAL SIGNS:  Blood pressure is 160/90, respirations 20, pulse rate 80,  temperature 97.6.  Capillary blood glucose is 162 mg/percent.   PURPOSE OF TODAY'S VISIT:  [name] is a 70 year old man who we are  seeing for stasis ulceration of his left lower extremity.  He returns  with moderate malodorous drainage, but no fever.   WOUND EXAM:  Inspection of the lower extremity shows that the ulcer has  decreased in volume, but there is definite extreme malodor with necrotic  tissue and a moist exudate.   TREATMENT:  A full thickness debridement was performed with a 10 blade  with hemorrhage controlled with direct pressure.  The wound was  copiously irritated with saline and Anacept rinse.  Thereafter, we  dressed him with a silver matrix dressing and an Unna wrap.  We will  reevaluate the patient in 1 week.  We have cultured him and we will plan  to place an Apligraf on this wound pending sterilization.           ______________________________  Theresia Majors Tanda Rockers, M.D.     [name]  D:  11/19/2006  T:  11/20/2006  Job:  [id number]

## 2011-04-28 NOTE — Assessment & Plan Note (Signed)
[facility]   NAME:  NYSIR, FERGUSSON               ACCOUNT NO.:  [id number]   MEDICAL RECORD NO.:  [id number]      DATE OF BIRTH:  1941-02-01   PHYSICIAN:  Theresia Majors. Tanda Rockers, M.D. VISIT DATE:  01/09/2007                                   OFFICE VISIT   PURPOSE OF TODAY'S VISIT:  Mr. Haberland returns for followup of his left  lower extremity stasis ulcers.  In the interim, he has been treated with  a calcium alginate and then an Unna wrap.  He denies excessive pain,  malodorous drainage, or fever.   WOUND EXAM:  Inspection of the lower extremity shows that the wound has  clinically improved.  There is advancement of epithelium from the  periphery.  There remains central necrosis requiring full-thickness  debridement.  The calcium alginate was removed.  There is no evidence of  ascending cellulitis or lymphangitis.  The full-thickness debridement  was performed with a #10 blade with hemorrhage control with direct  pressure.   DIAGNOSIS:  Improvement, stasis ulcer.   MANAGEMENT PLAN & GOAL:  We will reapply the Unna wrap with calcium  alginate.  We will reevaluate him in 1 week.           ______________________________  Theresia Majors. Tanda Rockers, M.D.     [name]  D:  01/09/2007  T:  01/09/2007  Job:  [id number]

## 2011-04-28 NOTE — Assessment & Plan Note (Signed)
[facility]   NAME:  [name], [name]               ACCOUNT NO.:  [id number]   MEDICAL RECORD NO.:  [id number]      DATE OF BIRTH:  Apr 25, 1941   PHYSICIAN:  Theresia Majors. Tanda Rockers, M.D. VISIT DATE:  01/30/2007                                   OFFICE VISIT   VITAL SIGNS:  Blood pressure is 145/78, respirations 20, pulse rate 88,  temperature 97.5.  Capillary blood glucose is 134 mg%.   PURPOSE OF TODAY'S VISIT:  [name] is a 70 year old man who returns  for followup of bilateral stasis with a left lower extremity stasis  ulceration.  We have treated him with serial debridements, allografts  and compression.  In the interim, he has complained of some pain at  night and in the lateral right lower extremity.  The pain in the left  leg associated with the ulceration has been well controlled.  He denies  excessive drainage, malodor, fever.   WOUND EXAM:  Inspection of the lower extremities shows that there is a  persistence of 2+ edema in the right lower extremity.  The lateral ulcer  on the right foot is completely scabbed over.  It appears to be dry with  minimum inflammation.  It does have some tactical pain on exam.  On the  left lower extremity, the stasis ulcer shows continued contraction.  There is 100% granulation with some areas of declamation on the  periphery.  There is a near bridging in the central portion of the  wound.  There is no malodor.  There is no evidence of infection.  Pedal  pulses remain palpable and the capillary refill is normal.   MANAGEMENT PLAN & GOAL:  Continue clinical improvement of stasis ulcer.   We will continue the compressive wrap of the left lower extremity.  We  have added Iodosorb to be applied topically to the right lower extremity  scabbed areas.  With regard to his pain, the patient describes  symptomatology that is consistent with a diabetic neuropathy.  We have  recommended that he discuss the management of diabetic  neuropathy with  his primary care physician, Dr. Janey Greaser.  In the interim, we have given  him 20 tablets of Demerol 50 mg, he will take these every 6-8 hours, but  mostly at night to allow him to sleep.  If he experiences progressive  pain in the area of the wrap, he is to remove the wrap completely and  call the clinic for a followup visit.  We have given him an opportunity  to ask questions.  He seems to understand.  We will reevaluate him in  one week.           ______________________________  Theresia Majors. Tanda Rockers, M.D.     [name]  D:  01/30/2007  T:  01/31/2007  Job:  [id number]   cc:   Janey Greaser, M.D.

## 2011-04-28 NOTE — Assessment & Plan Note (Signed)
[facility]   NAME:  [name], [name]               ACCOUNT NO.:  [id number]   MEDICAL RECORD NO.:  [id number]      DATE OF BIRTH:  1941-12-03   PHYSICIAN:  Theresia Majors. Tanda Rockers, M.D.      VISIT DATE:                                     OFFICE VISIT   VITAL SIGNS:  Blood pressure 138/88, respirations 22, pulse rate 74, and he  is afebrile.  Capillary blood glucose is 106 mg percent.   PURPOSE OF TODAY'S VISIT:  [name] is a 70 year old man, who we are  following for stasis ulcerations.  During his last visit, he was treated  with topical gentamycin ointment and a Profore.  He had an interim culture  that was positive for Pseudomonas aeruginosa.  The sensitivities suggested  that he was sensitive to ceftazidime.  In the interim, he denies fever.   WOUND EXAM:  Inspection of the left lower extremity shows that there is a  persistence of 2-3+ edema.  There is no linear wrinkling of the skin.  The  ulcers themselves have a moderate exudate over all of the stasis areas.  There is a moderate amount of desquamation.  The wounds were cleansed with  Anasept and full-thickness debridement of the entire body of stasis  ulceration was performed.  Areas of desquamation were lifted with the #10  scalpel also.  Thereafter, the wound was dressed with an Ultra Mide Cream  and a multilevel Profore wrap was placed.   DIAGNOSIS:  Improvement of stasis ulceration with adequate debridement.   CHANGE IN MEDS:  We started him on ceftizoxime 400 mg 1 p.o. daily for a 10-  day course.   MANAGEMENT PLAN & GOAL:  We will reevaluate the patient in one week.           ______________________________  Theresia Majors. Tanda Rockers, M.D.     [name]  D:  10/02/2006  T:  10/03/2006  Job:  [id number]

## 2011-04-28 NOTE — Assessment & Plan Note (Signed)
[facility]   NAME:  [name], [name]               ACCOUNT NO.:  [id number]   MEDICAL RECORD NO.:  [id number]           DATE OF BIRTH:   PHYSICIAN:  Theresia Majors. Tanda Rockers, M.D. VISIT DATE:  01/02/2007                                   OFFICE VISIT   VITAL SIGNS:  Blood pressure is 152/85, capillary blood glucose is 140  mg%, respirations are 20, pulse rate 95, temperature is 97.8.   PURPOSE OF TODAY'S VISIT:  [name] is a 70 year old man who we are  following for stasis ulceration of his left lower extremity.  In the  interim, we have treated him with an Apligraf serial debridements.  He  denies excessive drainage, malodor, pain or fever.   WOUND EXAM:  Inspection of the left lower extremity shows that the ulcer  bed has contracted significantly.  There is 100% granulation with  advancement of epithelium from the periphery.  There is no evidence of  ascending infection.   DIAGNOSIS:  Improved wound.   MANAGEMENT PLAN & GOAL:  We will continue the compression wrap.  We will  add a calcium alginate to affect the wound environment.  We will  reevaluate the patient in 1 week.           ______________________________  Theresia Majors. Tanda Rockers, M.D.     [name]  D:  01/02/2007  T:  01/02/2007  Job:  [id number]

## 2011-04-28 NOTE — Assessment & Plan Note (Signed)
[facility]   NAME:  [name], [name]               ACCOUNT NO.:  [id number]   MEDICAL RECORD NO.:  [id number]      DATE OF BIRTH:  06-27-1941   PHYSICIAN:  Theresia Majors. Tanda Rockers, M.D. VISIT DATE:  02/20/2007                                   OFFICE VISIT   VITAL SIGNS:  Blood pressure is 130/90, respirations are 18, pulse rate  91, temperature is 97.5.  Capillary blood glucose is 151 mg percent.   PURPOSE OF TODAY'S VISIT:  [name] is a 70 year old man who underwent  placement of an Apligraf on his left lower extremity one week ago.  He  also was treated for early stasis ulcerations on the medial aspect of  his right lower extremity with a compression wrap.   WOUND EXAM:  Inspection of the left lower extremity shows that the  Apligraf is intact.  The dressing was taken down to the .  There is no  excessive drainage.  The pedal pulse remains intact.  The sensation  continues to be decreased.  There is no evidence of vascular compromise  in the left lower extremity.  On the right lower extremity, there is a  circumferential area of subcutaneous ecchymosis, but there is no  ulceration in this area of local ecchymosis.  On the medial aspect of  the right ankle, there are two separate ulcers.  Both have underwent a  full-thickness debridement of nonviable tissue.  A similar area of  stasis ulcer is on the right lateral malleolar area.  Again, a full-  thickness debridement was performed.   WOUND SINCE LAST VISIT:  He reports that there has  been no excessive  drainage on the left leg.  There has been no malodor, pain, or fever.  On the right leg, there was some itching which he used a scratcher to  loosen the wrap.  He eventually pushed the wrap down to expose the area  of the itching.   DIAGNOSIS:  1. Satisfactory appearance of the left lower extremity Apligraf.  2. Stasis ulceration right lower extremity with subcutaneous      ecchymosis most likely related to  patient modification of the wrap.   MANAGEMENT PLAN & GOAL:  We will resume placement of the Profore wrap on  the left lower extremity.  On the right lower extremity, we will use  Iodosorb over the ulcerations, apply dry 4 x 4s, and apply a  noncompressive fishnet gauze to keep the 4 x 4s in place.  We have  instructed the patient to avoid prolonged dependency, to keep the right  leg elevated.  We have reviewed our utilization of compression wraps and  emphasized that the wrap will either have to be completely on or  completely off and without modification in the event that there is  itching or pain.  We can completely remove the wrap, call the clinic,  and make an appointment for the next day to  have a new wrap replaced.  Under no circumstances will we cut the wrap  or take part of the wrap off and re-wrap for reason of possible injury.  The patient seems to understand these instructions and indicates that he  will be compliant.  We will reevaluate him in one  week.      Theresia Majors. Tanda Rockers, M.D.  Electronically Signed     HAN/MEDQ  D:  02/20/2007  T:  02/21/2007  Job:  [id number]

## 2011-04-28 NOTE — Assessment & Plan Note (Signed)
[facility]   NAME:  [name], [name]               ACCOUNT NO.:  [id number]   MEDICAL RECORD NO.:  [id number]      DATE OF BIRTH:  05-Sep-1941   PHYSICIAN:  Theresia Majors. Tanda Rockers, M.D. VISIT DATE:  05/07/2007                                   OFFICE VISIT   SUBJECTIVE:  [name] returns for followup of bilateral stasis ulcers.  In the interim he has worn an Unna wrap.  He denies excessive drainage,  malodor, pain, or fever.   OBJECTIVE:  Blood pressure 124/76, respirations are 20, pulse rate 96,  temperature 97.7, capillary blood glucose is 138 mg%.   EXAMINATION:  Inspection of the left lower extremity shows that there  has been advancement of epithelium from the periphery.  The wounds have  a shaggy necrotic area which required a full thickness debridement with  the result in hemorrhage control with direct pressure.  Similarly the  wound #4 in the right medial malleolus underwent a full thickness  debridement with hemorrhage control with direct pressure.  There is  evidence of healthy granulation and advancing epithelium.  Wound #6 on  the right lateral malleolus is completely resolved.  There is no  evidence of ischemia.  There is no evidence of ascending infection.  There remains 2+ edema bilateral.   ASSESSMENT:  Clinical improvement of stasis ulcers.   PLAN:  We will resume Unna wrap as compression therapy.  We will  reevaluate the patient in one week p.r.n.      Harold A. Tanda Rockers, M.D.  Electronically Signed     HAN/MEDQ  D:  05/07/2007  T:  05/07/2007  Job:  [id number]

## 2011-04-28 NOTE — Assessment & Plan Note (Signed)
[facility]   NAME:  LANEY, BAGSHAW               ACCOUNT NO.:  [id number]   MEDICAL RECORD NO.:  [id number]      DATE OF BIRTH:  07-28-1941   PHYSICIAN:  Theresia Majors. Tanda Rockers, M.D. VISIT DATE:  04/18/2007                                   OFFICE VISIT   SUBJECTIVE:  Mr. Dentler is a 70 year old man who we follow for bilateral  statis ulcerations.  We have treated him with bilateral Unna boots.  He  has shown progressive improvement with serial debridements, Apligrafs  and silver matrix dressings.  He returns for follow-up.  He denies  interim fever.  He is complaining of some tightness in his lower  extremities related to his disuse atrophy.  There has been no excessive  malodor.   OBJECTIVE:  Blood pressure is 139/83.  Temperature of 97.9.  Pulse rate  92.  Respiratory rate of 20.  Capillary blood glucose is 138 mg%.  Inspection of the lower extremities shows a persistence of 2+ edema  bilaterally.  The wounds, however, on both lower extremities appear to  be improved.  There is decreased volume.  All wounds have a 100%  granulating base with moderate desquamation which is cleared with  mechanical technique.   ASSESSMENT:  Improvement of bilateral stasis wounds.   PLAN:  We will resume the patient's Unna wraps and reevaluate him in one  week.      Harold A. Tanda Rockers, M.D.  Electronically Signed     HAN/MEDQ  D:  04/18/2007  T:  04/18/2007  Job:  [id number]

## 2011-04-28 NOTE — Assessment & Plan Note (Signed)
[facility]   NAME:  [name], [name]               ACCOUNT NO.:  [id number]   MEDICAL RECORD NO.:  [id number]      DATE OF BIRTH:  1941/11/04   PHYSICIAN:  Theresia Majors. Tanda Rockers, M.D. VISIT DATE:  02/13/2007                                   OFFICE VISIT   VITAL SIGNS:  Blood pressure is 117/72, respirations 22, pulse rate 96.  The patient is afebrile.  His capillary blood glucose is 148 mg percent.   PURPOSE OF TODAY'S VISIT:  [name] is a 70 year old man who we have  followed for stasis ulcers over the past several months.  He has had  some clinical improvement, but these wounds have stagnated in their rate  of healing.  The wounds appear to be clean.  There is no evidence of  secondary infection.  The edema has been recently well-controlled with  the compression wraps.  We have recommended that we proceed with an Unna  wrap.   In the interim, he has developed a blister on the left hallux.  He also  has a medial and a lateral ulceration on the right foot, which had been  painful over the last week.  There has been no fever, no malodorous  drainage, and no ascending infection.   WOUND EXAM:  In the left lower extremity laterally we placed an Apligraf  without difficulty and secured the Apligraf in position utilizing a  Profore wrap.  Wounds #2 and 3 on right lower extremity were treated  with topical application of Iodosorb and a Profore wrap to control the  edema.  The patient's pedal pulses remain palpable and their is no  evidence of neurovascular compromise.   MANAGEMENT PLAN & GOAL:  We will reevaluate the patient in 1 week to  assess his response to therapy.           ______________________________  Theresia Majors. Tanda Rockers, M.D.     [name]  D:  02/13/2007  T:  02/14/2007  Job:  [id number]

## 2011-04-28 NOTE — Assessment & Plan Note (Signed)
[facility]   NAME:  [name], [name]               ACCOUNT NO.:  [id number]   MEDICAL RECORD NO.:  [id number]           DATE OF BIRTH:   PHYSICIAN:  Theresia Majors. Tanda Rockers, M.D. VISIT DATE:  03/06/2007                                   OFFICE VISIT   VITAL SIGNS:  Blood pressure is 143/82, respirations 20, pulse rate 97,  temperature 97.7.  Capillary blood glucose is 138 mg%.   PURPOSE OF TODAY'S VISIT:  Mr. Gunderman returns for followup of bilateral  stasis ulcerations.  During the interim, we have treated him with  Iodosorb ointment to the smaller stasis ulcers on the right lower  extremity.  We have continued him on a multilayer compression wrap on  the left lower extremity following his successful Apligraf.  During the  interim, he reports no excessive drainage, malodor, pain or fever.  There has been mild pain on the right lower extremity.   WOUND EXAM:  Inspection of the lower extremity shows a persistence of 2+  edema on the right lower extremity.  The medial and lateral wounds  underwent a full thickness debridement with a 10-blade with hemorrhage  control with direct pressure.  On the left lower extremity, the ulcer  shows dramatic contraction with advancement of epithelium from the  periphery.  There is scant drainage.  No malodor.  There is no evidence  of ischemia.  Neurologically, the patient retains protective sensation.   DIAGNOSIS:  Clinical improvement.   MANAGEMENT PLAN & GOAL:  We will resume bilateral compression therapy as  the patient has significant fluid retention bilaterally.  The patient  will continue his follow up with his primary care physician for  adjustments to diuretic therapy.  We have also encouraged him to avoid  prolonged dependency and to schedule periodic elevation of his legs.  We  have given him an opportunity to asks questions.  He seems to understand  and indicates that he will be compliant as per above.      Harold A.  Tanda Rockers, M.D.  Electronically Signed     HAN/MEDQ  D:  03/06/2007  T:  03/06/2007  Job:  [id number]   cc:   Lyndee Leo. Janey Greaser, MD

## 2011-04-28 NOTE — Assessment & Plan Note (Signed)
[facility]   NAME:  [name], [name]               ACCOUNT NO.:  [id number]   MEDICAL RECORD NO.:  [id number]      DATE OF BIRTH:  10-20-1941   PHYSICIAN:  Theresia Majors. Tanda Rockers, M.D. VISIT DATE:  05/29/2007                                   OFFICE VISIT   SUBJECTIVE:  [name] is a 70 year old man who we are following for  bilateral stasis ulcers.  In the interim, he has worn Unna wraps.  There  has been decreased drainage, no malodor, and no pain.   OBJECTIVE:  Vital signs are stable.  He is afebrile.   The ulcers show decrease in volume.  They are clean.  There is no  evidence of infection or vascular insufficiency.   ASSESSMENT:  Clinical improvement of stasis ulcers.   PLAN:  We will return him to the [facility] boot and reevaluate him in 1 week.      Harold A. Tanda Rockers, M.D.  Electronically Signed     HAN/MEDQ  D:  05/29/2007  T:  05/29/2007  Job:  [id number]

## 2011-04-28 NOTE — Assessment & Plan Note (Signed)
[facility]   NAME:  [name], [name]               ACCOUNT NO.:  [id number]   MEDICAL RECORD NO.:  [id number]      DATE OF BIRTH:  [birth year]   PHYSICIAN:  Theresia Majors. Tanda Rockers, M.D.      VISIT DATE:                                     OFFICE VISIT   VITAL SIGNS:  Blood pressure is 137/76, respiration is 18, pulse rate 72,  and he is afebrile.   PURPOSE OF TODAY'S VISIT:  [name] is a 70 year old male whom we are  following for a stasis ulceration involving his left lower extremity.  We  have treated him with multiple compression wraps, serial debridements as  well as oral and topical antiseptic antiobiotic agents.  He returns for  evaluation.  He reports that there is decreased drainage, no pain, and no  fever.   WOUND EXAM:  Inspection of the lower extremity on the left shows that there  is 3+ edema with necrotic areas and a heavy exudate over the wounds.  The  wound was sharply debrided with a #10 blade with hemorrhage control with  direct pressure.   DIAGNOSIS:  Improving stasis ulcer.   TREATMENT:  Anasep (sodium hypochlorite solution) was used to wash the  wound; thereafter, Anasep gel was applied and an Radio broadcast assistant wrap applied.   MANAGEMENT PLAN & GOAL:  Will continue compression therapy.  We will  reevaluate the patient in one week.           ______________________________  Theresia Majors. Tanda Rockers, M.D.     [name]  D:  10/29/2006  T:  10/30/2006  Job:  [id number]

## 2011-04-28 NOTE — Assessment & Plan Note (Signed)
[facility]   NAME:  [name], [name]               ACCOUNT NO.:  [id number]   MEDICAL RECORD NO.:  [id number]           DATE OF BIRTH:   PHYSICIAN:  Theresia Majors. Tanda Rockers, M.D. VISIT DATE:  09/11/2006                                     OFFICE VISIT   SUBJECTIVE:  [name] returns for followup of a stasis ulcer on his left  lower extremity.  In the interim, he has been treated with a compression  wrap and SelectSilver product.  He reports that he continues to have  excessive drainage, but it is decreasing, and the order has decreased  commensurate with volume.   OBJECTIVE:  VITAL SIGNS:  Blood pressure is 138/88, respirations 22, pulse  rate 72, and he is afebrile.  EXTREMITIES:  Inspection of the lower extremities shows that there is  advancement of granulation and epithelium in the periphery of the wound.  The drainage is being wicked away by the SelectSilver product into the  overlying gauze.   ASSESSMENT:  Clinical response and adequate compression of statis ulcers.   PLAN:  We will reevaluate the patient in 1 week.  We will continue the  SelectSilver product.           ______________________________  Theresia Majors. Tanda Rockers, M.D.     [name]  D:  09/11/2006  T:  09/12/2006  Job:  [id number]

## 2011-04-28 NOTE — Assessment & Plan Note (Signed)
[facility]   NAME:  [name], [name]               ACCOUNT NO.:  [id number]   MEDICAL RECORD NO.:  [id number]      DATE OF BIRTH:  September 13, 1941   PHYSICIAN:  Theresia Majors. Tanda Rockers, M.D.      VISIT DATE:                                   OFFICE VISIT   VITAL SIGNS:  Blood pressure is 175/96.  Respirations 20.  Pulse of 101.  Temperature 98.6.  Capillary blood glucose is 140 mg%.   PURPOSE OF TODAY'S VISIT:  [name] returns for followup of bilateral  stasis ulcers.  In the interim, we treated him with Unna wraps.  We are  following a total of 3 wounds.  He denies excessive drainage.  He does  report some malodor but he has had no fever or excessive pain.   WOUND EXAM:  Inspection of the lower extremities shows a persistence of  chronic stasis changes with bilateral 2+ edema on the right wound has  decreased in volume.  There is a moderate area of desquamation and  loosely adherent exudate.  A mechanical debridement was performed and  the wound was irrigated with saline without incident.  There is no  evidence of local infection, cellulitis or lymphangitis.  There is no  evidence of ischemia on the right lower extremity medially.  The would  has improved an area of desquamation and necrosis.  Underwent a full  thickness debridement.  Similarly the lateral wound #5 underwent a full  thickness debridement.   MANAGEMENT PLAN & GOAL:  Clinical improvement of all wounds.  We will  resume the use of Iodosorb and bilateral Unna wraps.  We will reevaluate  the patient in 1 week p.r.n.      Harold A. Tanda Rockers, M.D.  Electronically Signed     HAN/MEDQ  D:  03/12/2007  T:  03/12/2007  Job:  [id number]

## 2011-04-28 NOTE — Assessment & Plan Note (Signed)
[facility]   NAME:  EDWORD, CU               ACCOUNT NO.:  [id number]   MEDICAL RECORD NO.:  [id number]      DATE OF BIRTH:  1941/09/07   PHYSICIAN:  Theresia Majors. Tanda Rockers, M.D. VISIT DATE:  10/22/2006                                     OFFICE VISIT   VITAL SIGNS:  Blood pressure is 100/80, respirations 18, pulse rate 76, and  he is afebrile.  Capillary blood glucose is 146 mg percent.   PURPOSE OF TODAY'S VISIT:  Mr. Mallek returns for followup of a left lower  extremity stasis ulcer.  During the interim, he has been at the [employer]  and he has kept a Profore wrap in place.  He denies fever.   WOUND EXAM:  Inspection of the wound shows that there are areas of full  thickness, necrosis in the center, but the wound is contracted significantly  with advancement of epithelium from all areas of the circumference.  The  wound underwent a full thickness debridement of the necrotic areas with  hemorrhage control with direct pressure.  Thereafter, the wound was cleansed  with Anasept gel and a Select Silver dressing was placed in addition to the  Profore wrap.   DIAGNOSIS:  Improved stasis ulcer.   MANAGEMENT PLAN & GOAL:  Will reevaluate the patient in one week.           ______________________________  Theresia Majors. Tanda Rockers, M.D.     [name]  D:  10/22/2006  T:  10/23/2006  Job:  [id number]

## 2011-04-28 NOTE — Assessment & Plan Note (Signed)
[facility]   NAME:  [name], [name]               ACCOUNT NO.:  [id number]   MEDICAL RECORD NO.:  [id number]      DATE OF BIRTH:  [birth year]   PHYSICIAN:  Theresia Majors. Tanda Rockers, M.D.      VISIT DATE:                                   OFFICE VISIT   SUBJECTIVE:  [name] is a 70 year old man who we followed for  bilateral stasis ulceration.  In the interim, we have treated him with  an Unna wrap to the left lower extremity and continued elevation with  Iodosorb gel to the ulcers on the right lower extremity.  He reports  that there has been increasing swelling in the right lower extremity  with moderate pain.  There has been no fever.  There has been no  malodorous drainage.   OBJECTIVE:  Blood pressure is 146/85.  Respirations 22.  Pulse rate 88.  Temperature 97.8.  Inspection of the lower extremities shows that there  continues to be 100% granulation on the anterior wounds of the left  lower extremity.  There is mild hyperemia.  There is no evidence of  active infection.  There is no evidence of ischemia.  On the right lower  extremity the Iodosorb was impact in both of the medial wounds.  This  was debrided free with a #10 blade.  There is evidence of  epithelialization on the lateral right foot.  There is an inflammatory  appearing eschar which was sharply debrided without difficulty.  The  patient continues to have 2+ edema on the right lower extremity.   ASSESSMENT:  Bilateral stasis ulcers.   PLAN:  We will return the patient to bilateral Unna wraps.  We will use  an Aquacel absorptive silver dressing on the left lower extremity.  We  will reevaluate the patient in 1 week p.r.n.      Harold A. Tanda Rockers, M.D.  Electronically Signed     HAN/MEDQ  D:  03/28/2007  T:  03/29/2007  Job:  [id number]

## 2011-04-28 NOTE — Assessment & Plan Note (Signed)
[facility]   NAME:  [name], [name]               ACCOUNT NO.:  [id number]   MEDICAL RECORD NO.:  [id number]      DATE OF BIRTH:  08-Feb-1941   PHYSICIAN:  Theresia Majors. Tanda Rockers, M.D. VISIT DATE:  03/21/2007                                   OFFICE VISIT   SUBJECTIVE:  Mr. Treat returns for followup of a stasis ulceration.  During the interim, he denies excessive drainage, malodor, or fever.  There has been some mild discomfort.  This discomfort is only noticeable  when he attempts ambulation.   OBJECTIVE:  His blood pressure is 167/85.  Respirations 22.  Pulse rate  93.  Temperature 97.9.  Inspection of the left lower extremity shows that the areas of  ulceration are 100% granulated with clearly advancing epithelium from  the periphery.  The bridge is completed and it essentially separated the  wound into a superior and inferior portion.  On the right leg, the edema  is still present, 1+.  The medial wound has been impacted with Iodosorb.  There is no excessive drainage.  There is no excessive necrosis or  malodor.  Similarly, the right lateral wound is dry without exudate.  There is no evidence of ischemic compromise.  Neurologically the patient remains sensitive to soft touch.   ASSESSMENT:  Clinically improved stasis ulcer.   PLAN:  We will return the patient to an Unna boot for wound #1.  Wounds  #4 and 5 we will continue Iodosorb ointment daily with a 4 x 4 gauze  placed over the wounds, and a fishnet will be used to hold the gauze in  place.  We will reevaluate the patient in 1 week.      Harold A. Tanda Rockers, M.D.  Electronically Signed     HAN/MEDQ  D:  03/21/2007  T:  03/22/2007  Job:  [id number]

## 2011-04-28 NOTE — Assessment & Plan Note (Signed)
[facility]   NAME:  [name], [name]               ACCOUNT NO.:  [id number]   MEDICAL RECORD NO.:  [id number]      DATE OF BIRTH:  07/16/1941   PHYSICIAN:  Theresia Majors. Tanda Rockers, M.D. VISIT DATE:  09/05/2006                                     OFFICE VISIT   VITAL SIGNS:  His blood pressure is 130/75, respirations are 20, pulse rate  72, he is afebrile.  Capillary blood glucose is 144 mg%.   PURPOSE OF TODAY'S VISIT:  [name] is a 70 year old man with a large  stasis ulcer on his left lower extremity.   WOUND EXAM:  Inspection of the lower extremity shows that the stasis changes  are persistent.  The ulcers themselves are showing evidence of clearing with  full granulating bases.  There are areas of necrotic tissue and excessive  weeping.  This has been better controlled with the Silver Select dressing.  There are no loculations.  There are no abscess formations.   WOUND SINCE LAST VISIT:  In the interim we have treated him with a  unidirectional silver dressing.  He reports that he has had continued  drainage.   MANAGEMENT PLAN & GOAL:  Clinical improvement with compression wrap.   Plan:  We are returning the patient to a Silver Select dressing, preceded by  a thorough cleansing with antiseptic rinse.  We have advised the patient to  call the clinic for an appointment sooner if he experiences excessive  drainage and excessive malodor.  Otherwise, we will reevaluate him on  Friday.           ______________________________  Theresia Majors. Tanda Rockers, M.D.     [name]  D:  09/03/2006  T:  09/05/2006  Job:  [id number]

## 2011-04-28 NOTE — Assessment & Plan Note (Signed)
[facility]   NAME:  [name], [name]               ACCOUNT NO.:  [id number]   MEDICAL RECORD NO.:  [id number]      DATE OF BIRTH:  [birth year]   PHYSICIAN:  Maxwell Caul, M.D.      VISIT DATE:                                     OFFICE VISIT   PURPOSE OF TODAY'S VISIT:  Review of severe left leg venous stasis wounds.   VITAL SIGNS:  The patient is afebrile.   Since his last visit, he was wrapped with Profore.  He has had SelectSilver  applied.  A culture from these draining wounds has come back showing  abundant pseudomonas.  I have therefore elected to apply topical gentamicin  as he really has no evidence of infection.   OBJECTIVE:  On examination, severe left leg venous stasis with large islands  of wounds.  There is a fair amount of slough today which was extensively  full thickness debrided with topical lidocaine and direct pressure for  hemostasis.  He tolerated this reasonably well.  Most of the wounds are in a  good state of granulation; however, there is only minimal epithelization at  this point.  As mentioned, there is no evidence of cellulitis.   IMPRESSION:  Severe left leg venous stasis, status post full thickness  debridement and a lot of the islands of these wounds.   PLAN:  I have gone ahead and applied gentamicin ointment and re-wrapped with  the Profore.  He seems to be tolerating this reasonably well.           ______________________________  Maxwell Caul, M.D.     MGR/MEDQ  D:  09/26/2006  T:  09/27/2006  Job:  [id number]

## 2011-04-28 NOTE — Assessment & Plan Note (Signed)
[facility]   NAME:  OWIN, VIGNOLA               ACCOUNT NO.:  [id number]   MEDICAL RECORD NO.:  [id number]           DATE OF BIRTH:   PHYSICIAN:  Theresia Majors. Tanda Rockers, M.D. VISIT DATE:  02/27/2007                                   OFFICE VISIT   VITAL SIGNS:  His blood pressure is 140/80, respirations 20, pulse is 87  and regular, temperature is 98.  Capillary blood glucose is 128 mg%.   PURPOSE OF TODAY'S VISIT:  [name] is a 70 year old man who returns to  the clinic today 2 weeks following placement of an Apligraf on the  lateral left lower extremity.  In the interim, he denies excessive  drainage, pain or fever.  Also on his right lower extremity, he  continues to use Iodosorb with dry dressing and a fish net gauze.  There  has been significant decrease in drainage and remarkable decrease in  pain.   WOUND EXAM:  Inspection of the left lower extremity, after removal of  his wrap, shows that there is healthy, well vascularized granulation.  There is no extreme malodor.  The wounds have been photographed and  entered into the computer program.  The right lower extremity wounds are  similarly decreased in volume with healthy-appearing granulation and a  decrease in hyperemia.  There is 1+ edema bilaterally.  There is no  evidence of ischemia.  Neurologically, the patient retains protective  sensation.   DIAGNOSIS:  Clinical improvement with the Apligraf and also with the  Iodosorb.   MANAGEMENT PLAN & GOAL:  We will continue the Profore wrap on the left  lower extremity.  We will resume Iodosorb in a dry dressing on the  right.  We will reevaluate the patient in 1 week.      Harold A. Tanda Rockers, M.D.  Electronically Signed     HAN/MEDQ  D:  02/27/2007  T:  02/27/2007  Job:  [id number]

## 2011-04-28 NOTE — Assessment & Plan Note (Signed)
[facility]   NAME:  [name]               ACCOUNT NO.:  [id number]   MEDICAL RECORD NO.:  [id number]      DATE OF BIRTH:  [birth year]   PHYSICIAN:  Theresia Majors. Tanda Rockers, M.D. VISIT DATE:  12/26/2006                                   OFFICE VISIT   VITAL SIGNS:  Blood pressure 139/78, respirations 20, pulse rate 89,  temperature 98.1.  Capillary blood glucose is 134 mg%.   PURPOSE OF TODAY'S VISIT:  [name] returns for followup of a left  lower extremity stasis ulcer.  In the interim, he has been treated with  a compression wrap.  There has been continued drainage, but no fever.   WOUND EXAM:  Inspection of the left lower extremity shows that the  changes of stasis are persistent, but the wound has continually  decreased in volume.  There is 100% granulation without excessive  drainage following removal of his wrap.  There is no excessive malodor  either.   ASSESSMENT:  Improvement of the stasis ulcer.   MANAGEMENT PLAN & GOAL:  We will continue external compression with the  Unna wrap.  The patient is requesting p.o. analgesia for pains in the  knees and hips.  We recommended that he consult his primary care  physician as we do not want to initiate management of a chronic  arthritic condition via the wound center.  Nevertheless, if he has  difficulty contacting his primary care physician, we would be happy to  provide him with a small amount of analgesic compounds until he can get  his appointment with the PCP.  The patient is agreeable with that  recommendation and indicates that he will be seeing his primary  physician within the week.           ______________________________  Theresia Majors. Tanda Rockers, M.D.     [name]  D:  12/26/2006  T:  12/26/2006  Job:  [id number]

## 2011-04-28 NOTE — Assessment & Plan Note (Signed)
[facility]   NAME:  [name], [name]               ACCOUNT NO.:  [id number]   MEDICAL RECORD NO.:  [id number]      DATE OF BIRTH:  [birth year]   PHYSICIAN:  Theresia Majors. Tanda Rockers, M.D.      VISIT DATE:                                   OFFICE VISIT   VITAL SIGNS:  Blood pressure is 140/80, respirations are 20, pulse rate  of 82, and he is afebrile.   PURPOSE OF TODAY'S VISIT:  [name] is a 70 year old man with bilateral  stasis ulcers.  We have elected to proceed with an Apligraf placement on  his left lower extremity.  In addition, he has an ulcer on the medial  aspect of his right leg.  In the interim, he denies excessive drainage,  malodor, or fever.   WOUND EXAM:  Inspection of the left lower extremity shows that the wound  is 100% granulated.  There is a soft exudate which was thickly adherent.  It was debrided with a #10 blade with hemorrhage control with direct  pressure.  Thereafter, the wound was copiously irrigated with saline.  A  hand-meshed Apligraf was then placed in position and secured with a  Mepitel dressing and a Profore compressive wrap was applied.   Attention was turned to the medial right lower extremity.  The stasis  ulcer underwent a full-thickness debridement with hemorrhage control  with direct pressure, and, thereafter, a Profore wrap was applied.  The  patient tolerated both procedures without incident.   DIAGNOSES:  Satisfactory placement of Apligraf on the left lower  extremity and satisfactory debridement of the right lower extremity.   MANAGEMENT PLAN & GOAL:  We will reevaluate the patient in 1 week.           ______________________________  Theresia Majors. Tanda Rockers, M.D.     [name]  D:  11/26/2006  T:  11/27/2006  Job:  [id number]

## 2011-04-28 NOTE — Assessment & Plan Note (Signed)
[facility]   NAME:  [name], [name]               ACCOUNT NO.:  [id number]   MEDICAL RECORD NO.:  [id number]      DATE OF BIRTH:  12-Apr-1941   PHYSICIAN:  Theresia Majors. Tanda Rockers, M.D. VISIT DATE:  05/21/2007                                   OFFICE VISIT   SUBJECTIVE:  [name] is a 70 year old man who we are following for  bilateral stasis ulcers.  We have treated with Unna wraps.  In the  interim, he reports moderate drainage and malodor, but no excessive  drainage or fever.   OBJECTIVE:  His blood pressure is 139/87, respirations 20, pulse rate  86, temperature 97.2.  Inspection of the lower extremity shows that  there is persistent 2+ edema with chronic changes of stasis.  The wounds  themselves show progressing epithelium from the periphery.  There is no  excessive drainage, no hyperemia, and no evidence of infection.  Pedal  pulse remains readily palpable.   ASSESSMENT:  Continued improvement and response to compression.   PLAN:  We will return the patient to bilateral [employer].  We will  reevaluate him in a week.      Harold A. Tanda Rockers, M.D.  Electronically Signed     HAN/MEDQ  D:  05/21/2007  T:  05/22/2007  Job:  [id number]

## 2011-04-28 NOTE — Assessment & Plan Note (Signed)
[facility]   NAME:  KELLY, EISLER               ACCOUNT NO.:  [id number]   MEDICAL RECORD NO.:  [id number]      DATE OF BIRTH:  02/02/1941   PHYSICIAN:  Theresia Majors. Tanda Rockers, M.D. VISIT DATE:  09/17/2006                                     OFFICE VISIT   VITAL SIGNS:  Blood pressure is 170, respirations are 22, pulse rate of 100,  and he is afebrile.   PURPOSE OF TODAY'S VISIT:  [name] is a 70 year old man who has been  followed in the [facility] for an extensive stasis ulceration of his left  lower extremity.  We have treated him with a combination of silver dressings  and compression in an attempt to control his edema and decrease his  bacterial load.  In the interim he reports that there has been decreased  drainage and decrease in the malodor.   WOUND EXAM:  Inspection of his left lower extremity shows that there are  areas of ulceration which have shrunk dramatically.  There has been adequate  compression as manifest by lineal wrinkling of his skin.  The ulcers  themselves all appear to be thoroughly granulated with some exudate, which  was easily debrided mechanically.   WOUND SINCE LAST VISIT:   CHANGE IN INTERVAL MEDICAL HISTORY:   DIAGNOSIS:  Improved stasis ulcer.   TREATMENT:   ANESTHETIC USED:   TISSUE DEBRIDED:   LEVEL:   CHANGE IN MEDS:   COMPRESSION BANDAGE:   OTHER:   MANAGEMENT PLAN & GOAL:  We have cultured these wounds again, and we have  reapplied a Select Silver dressing and a compressive wrap.  We will  reevaluate him in 5 days.          ______________________________  Theresia Majors Tanda Rockers, M.D.    [name]  D:  09/17/2006  T:  09/19/2006  Job:  [id number]

## 2011-05-11 ENCOUNTER — Encounter (HOSPITAL_BASED_OUTPATIENT_CLINIC_OR_DEPARTMENT_OTHER): Payer: Medicare Other | Attending: General Surgery

## 2011-05-11 DIAGNOSIS — L97809 Non-pressure chronic ulcer of other part of unspecified lower leg with unspecified severity: Secondary | ICD-10-CM | POA: Insufficient documentation

## 2011-05-11 DIAGNOSIS — I872 Venous insufficiency (chronic) (peripheral): Secondary | ICD-10-CM | POA: Insufficient documentation

## 2011-06-06 ENCOUNTER — Inpatient Hospital Stay (HOSPITAL_COMMUNITY)
Admission: EM | Admit: 2011-06-06 | Discharge: 2011-06-16 | DRG: 371 | Disposition: A | Discharge status: Skilled Nursing Facility | Payer: Medicare Other | Attending: Internal Medicine | Admitting: Internal Medicine

## 2011-06-06 ENCOUNTER — Emergency Department (HOSPITAL_COMMUNITY): Payer: Medicare Other

## 2011-06-06 DIAGNOSIS — E876 Hypokalemia: Secondary | ICD-10-CM | POA: Diagnosis present

## 2011-06-06 DIAGNOSIS — F341 Dysthymic disorder: Secondary | ICD-10-CM | POA: Diagnosis present

## 2011-06-06 DIAGNOSIS — G8929 Other chronic pain: Secondary | ICD-10-CM | POA: Diagnosis present

## 2011-06-06 DIAGNOSIS — N179 Acute kidney failure, unspecified: Secondary | ICD-10-CM | POA: Diagnosis present

## 2011-06-06 DIAGNOSIS — E119 Type 2 diabetes mellitus without complications: Secondary | ICD-10-CM | POA: Diagnosis present

## 2011-06-06 DIAGNOSIS — R5381 Other malaise: Secondary | ICD-10-CM | POA: Diagnosis present

## 2011-06-06 DIAGNOSIS — A419 Sepsis, unspecified organism: Secondary | ICD-10-CM | POA: Diagnosis present

## 2011-06-06 DIAGNOSIS — I872 Venous insufficiency (chronic) (peripheral): Secondary | ICD-10-CM | POA: Diagnosis present

## 2011-06-06 DIAGNOSIS — Z8546 Personal history of malignant neoplasm of prostate: Secondary | ICD-10-CM

## 2011-06-06 DIAGNOSIS — L97409 Non-pressure chronic ulcer of unspecified heel and midfoot with unspecified severity: Secondary | ICD-10-CM | POA: Diagnosis present

## 2011-06-06 DIAGNOSIS — M549 Dorsalgia, unspecified: Secondary | ICD-10-CM | POA: Diagnosis present

## 2011-06-06 DIAGNOSIS — A0472 Enterocolitis due to Clostridium difficile, not specified as recurrent: Principal | ICD-10-CM | POA: Diagnosis present

## 2011-06-06 DIAGNOSIS — G609 Hereditary and idiopathic neuropathy, unspecified: Secondary | ICD-10-CM | POA: Diagnosis present

## 2011-06-06 DIAGNOSIS — D638 Anemia in other chronic diseases classified elsewhere: Secondary | ICD-10-CM | POA: Diagnosis present

## 2011-06-06 DIAGNOSIS — E785 Hyperlipidemia, unspecified: Secondary | ICD-10-CM | POA: Diagnosis present

## 2011-06-06 LAB — BLOOD GAS, ARTERIAL
Acid-base deficit: 1.4 mmol/L (ref 0.0–2.0)
Drawn by: 103701
O2 Content: 1 L/min
O2 Saturation: 96.3 %
pO2, Arterial: 80 mmHg (ref 80.0–100.0)

## 2011-06-06 LAB — COMPREHENSIVE METABOLIC PANEL
ALT: 13 U/L (ref 0–53)
BUN: 31 mg/dL — ABNORMAL HIGH (ref 6–23)
CO2: 26 mEq/L (ref 19–32)
Calcium: 8.9 mg/dL (ref 8.4–10.5)
Creatinine, Ser: 2.63 mg/dL — ABNORMAL HIGH (ref 0.50–1.35)
GFR calc Af Amer: 30 mL/min — ABNORMAL LOW (ref >60)
GFR calc non Af Amer: 24 mL/min — ABNORMAL LOW (ref >60)
Glucose, Bld: 161 mg/dL — ABNORMAL HIGH (ref 70–99)

## 2011-06-06 LAB — CK TOTAL AND CKMB (NOT AT ARMC): CK, MB: 6.6 ng/mL (ref 0.3–4.0)

## 2011-06-06 LAB — CBC
HCT: 37 % — ABNORMAL LOW (ref 39.0–52.0)
Hemoglobin: 11.9 g/dL — ABNORMAL LOW (ref 13.0–17.0)
MCH: 29 pg (ref 26.0–34.0)
MCHC: 32.2 g/dL (ref 30.0–36.0)
MCV: 90.2 fL (ref 78.0–100.0)

## 2011-06-06 LAB — GLUCOSE, CAPILLARY: Glucose-Capillary: 169 mg/dL — ABNORMAL HIGH (ref 70–99)

## 2011-06-06 LAB — MRSA PCR SCREENING: MRSA by PCR: NEGATIVE

## 2011-06-06 LAB — DIFFERENTIAL
Basophils Relative: 0 % (ref 0–1)
Eosinophils Relative: 1 % (ref 0–5)
Lymphs Abs: 1.1 10*3/uL (ref 0.7–4.0)
Monocytes Absolute: 0.6 10*3/uL (ref 0.1–1.0)

## 2011-06-06 LAB — PRO B NATRIURETIC PEPTIDE: Pro B Natriuretic peptide (BNP): 257.1 pg/mL — ABNORMAL HIGH (ref 0–125)

## 2011-06-06 LAB — OCCULT BLOOD, POC DEVICE: Fecal Occult Bld: POSITIVE

## 2011-06-07 ENCOUNTER — Encounter (HOSPITAL_COMMUNITY): Payer: Self-pay | Admitting: Radiology

## 2011-06-07 ENCOUNTER — Inpatient Hospital Stay (HOSPITAL_COMMUNITY): Payer: Medicare Other

## 2011-06-07 DIAGNOSIS — M7989 Other specified soft tissue disorders: Secondary | ICD-10-CM

## 2011-06-07 LAB — CK TOTAL AND CKMB (NOT AT ARMC)
CK, MB: 7 ng/mL (ref 0.3–4.0)
CK, MB: 7.2 ng/mL (ref 0.3–4.0)
Relative Index: 1.3 (ref 0.0–2.5)
Total CK: 406 U/L — ABNORMAL HIGH (ref 7–232)
Total CK: 571 U/L — ABNORMAL HIGH (ref 7–232)

## 2011-06-07 LAB — CBC
MCH: 29.6 pg (ref 26.0–34.0)
MCHC: 32.7 g/dL (ref 30.0–36.0)
Platelets: 168 10*3/uL (ref 150–400)
RBC: 3.62 MIL/uL — ABNORMAL LOW (ref 4.22–5.81)
RDW: 13.9 % (ref 11.5–15.5)

## 2011-06-07 LAB — BASIC METABOLIC PANEL
Calcium: 7.8 mg/dL — ABNORMAL LOW (ref 8.4–10.5)
GFR calc Af Amer: 38 mL/min — ABNORMAL LOW (ref >60)
GFR calc non Af Amer: 31 mL/min — ABNORMAL LOW (ref >60)
Sodium: 135 mEq/L (ref 135–145)

## 2011-06-07 LAB — GLUCOSE, CAPILLARY: Glucose-Capillary: 195 mg/dL — ABNORMAL HIGH (ref 70–99)

## 2011-06-07 MED ORDER — XENON XE 133 GAS
7.7000 | GAS_FOR_INHALATION | Freq: Once | RESPIRATORY_TRACT | Status: AC | PRN
  Administered 2011-06-07: 7.7 via RESPIRATORY_TRACT

## 2011-06-07 MED ORDER — TECHNETIUM TO 99M ALBUMIN AGGREGATED
4.5000 | Freq: Once | INTRAVENOUS | Status: AC | PRN
  Administered 2011-06-07: 4.5 via INTRAVENOUS

## 2011-06-08 ENCOUNTER — Encounter (HOSPITAL_BASED_OUTPATIENT_CLINIC_OR_DEPARTMENT_OTHER): Payer: Medicare Other | Attending: General Surgery

## 2011-06-08 DIAGNOSIS — L97809 Non-pressure chronic ulcer of other part of unspecified lower leg with unspecified severity: Secondary | ICD-10-CM | POA: Insufficient documentation

## 2011-06-08 DIAGNOSIS — I872 Venous insufficiency (chronic) (peripheral): Secondary | ICD-10-CM | POA: Insufficient documentation

## 2011-06-08 LAB — BASIC METABOLIC PANEL
CO2: 25 mEq/L (ref 19–32)
Calcium: 8.3 mg/dL — ABNORMAL LOW (ref 8.4–10.5)
Chloride: 106 mEq/L (ref 96–112)
Sodium: 137 mEq/L (ref 135–145)

## 2011-06-08 LAB — GLUCOSE, CAPILLARY
Glucose-Capillary: 122 mg/dL — ABNORMAL HIGH (ref 70–99)
Glucose-Capillary: 150 mg/dL — ABNORMAL HIGH (ref 70–99)
Glucose-Capillary: 193 mg/dL — ABNORMAL HIGH (ref 70–99)

## 2011-06-09 LAB — URINALYSIS, ROUTINE W REFLEX MICROSCOPIC
Glucose, UA: 100 mg/dL — AB
Ketones, ur: NEGATIVE mg/dL
Leukocytes, UA: NEGATIVE
pH: 5.5 (ref 5.0–8.0)

## 2011-06-09 LAB — CULTURE, BLOOD (ROUTINE X 2)

## 2011-06-09 LAB — BASIC METABOLIC PANEL
CO2: 23 mEq/L (ref 19–32)
Calcium: 8.5 mg/dL (ref 8.4–10.5)
GFR calc Af Amer: >60 mL/min (ref >60)
GFR calc non Af Amer: >60 mL/min (ref >60)
Sodium: 138 mEq/L (ref 135–145)

## 2011-06-09 LAB — URINE MICROSCOPIC-ADD ON

## 2011-06-09 LAB — GLUCOSE, CAPILLARY
Glucose-Capillary: 98 mg/dL (ref 70–99)
Glucose-Capillary: 148 mg/dL — ABNORMAL HIGH (ref 70–99)

## 2011-06-10 LAB — CLOSTRIDIUM DIFFICILE BY PCR: Toxigenic C. Difficile by PCR: POSITIVE — AB

## 2011-06-10 LAB — URINE CULTURE
Colony Count: NO GROWTH
Culture  Setup Time: 201206290947
Culture: NO GROWTH
Special Requests: NEGATIVE

## 2011-06-11 LAB — BASIC METABOLIC PANEL
CO2: 24 mEq/L (ref 19–32)
Calcium: 8.8 mg/dL (ref 8.4–10.5)
Creatinine, Ser: 0.78 mg/dL (ref 0.50–1.35)
GFR calc non Af Amer: >60 mL/min (ref >60)
Sodium: 139 mEq/L (ref 135–145)

## 2011-06-11 LAB — GLUCOSE, CAPILLARY
Glucose-Capillary: 198 mg/dL — ABNORMAL HIGH (ref 70–99)
Glucose-Capillary: 208 mg/dL — ABNORMAL HIGH (ref 70–99)
Glucose-Capillary: 142 mg/dL — ABNORMAL HIGH (ref 70–99)
Glucose-Capillary: 167 mg/dL — ABNORMAL HIGH (ref 70–99)

## 2011-06-11 LAB — CBC
MCH: 30 pg (ref 26.0–34.0)
MCHC: 33.7 g/dL (ref 30.0–36.0)
MCV: 88.8 fL (ref 78.0–100.0)
Platelets: 178 10*3/uL (ref 150–400)
RDW: 13.6 % (ref 11.5–15.5)

## 2011-06-12 LAB — GLUCOSE, CAPILLARY
Glucose-Capillary: 144 mg/dL — ABNORMAL HIGH (ref 70–99)
Glucose-Capillary: 205 mg/dL — ABNORMAL HIGH (ref 70–99)
Glucose-Capillary: 184 mg/dL — ABNORMAL HIGH (ref 70–99)

## 2011-06-12 LAB — BASIC METABOLIC PANEL
CO2: 23 mEq/L (ref 19–32)
Calcium: 8.8 mg/dL (ref 8.4–10.5)
Chloride: 110 mEq/L (ref 96–112)
Creatinine, Ser: 0.69 mg/dL (ref 0.50–1.35)
Glucose, Bld: 138 mg/dL — ABNORMAL HIGH (ref 70–99)

## 2011-06-12 LAB — CULTURE, BLOOD (ROUTINE X 2): Culture: NO GROWTH

## 2011-06-13 LAB — BASIC METABOLIC PANEL
BUN: 10 mg/dL (ref 6–23)
Calcium: 8.9 mg/dL (ref 8.4–10.5)
GFR calc Af Amer: >60 mL/min (ref >60)
GFR calc non Af Amer: >60 mL/min (ref >60)
Glucose, Bld: 133 mg/dL — ABNORMAL HIGH (ref 70–99)

## 2011-06-13 NOTE — H&P (Signed)
[name], CONRADT NO.:  [id number]  MEDICAL RECORD NO.:  [id number]  LOCATION:  1233                         FACILITY:  [facility]  PHYSICIAN:  Hillery Aldo, M.D.   DATE OF BIRTH:  13-Jan-1941  DATE OF ADMISSION:  06/06/2011 DATE OF DISCHARGE:                             HISTORY & PHYSICAL   PRIMARY CARE PHYSICIAN:  Dr. Tenny Craw at [place] on [facility].  CHIEF COMPLAINT:  Dizziness, diarrhea.  HISTORY OF PRESENT ILLNESS:  The patient is a 70 year old male with a 1- day history of diarrhea.  The patient reports diarrhea as being intermittent, however.  He has had some weakness and his symptoms progressed over the past several days and are now accompanied by dizziness and therefore he and his wife called EMS.  Upon EMS assessment, the patient's blood pressure was found to be 88/42. According to the patient's wife, he has become increasingly lethargic, sleeping for several hours during the day and is weak with ambulation. The patient himself complains of right lower extremity pain, hip and back pain that is chronic.  He has an occasional hot feeling, but no frank fever or chills.  He has chronic dyspnea on exertion, and occasional cough which is sometimes productive that is unchanged over baseline.  He denies dysuria.  He has noticed a slight amount of blood in his stool, but nothing significant.  Upon initial evaluation in the emergency department, the patient was found to be hypotensive and therefore has been referred to the hospitalist service for further evaluation and treatment.  PAST MEDICAL HISTORY: 1. Hypertension. 2. Type 2 diabetes. 3. Anxiety/depression. 4. History of prostate cancer, status post radiation therapy. 5. Peripheral neuropathy. 6. Chronic back pain. 7. Hyperlipidemia. 8. Venous stasis dermatitis and chronic lower extremity wounds     bilaterally.  PAST SURGICAL HISTORY: 1. Bilateral lower extremity vein stripping. 2.  Bilateral hip repair.  FAMILY HISTORY:  The patient's father died of an MI.  His mother had 2 MIs and dementia before her death.  The patient has a sister with coronary artery disease.  SOCIAL HISTORY:  The patient is married and lives with his wife.  He is a lifelong nonsmoker.  He rarely drinks alcohol and has no history of drug abuse.  He is a retired Medical illustrator.  ALLERGIES:  No known drug allergies.  CURRENT MEDICATIONS: 1. Humulin 70/30 26 units in the morning and 12 units in the p.m. 2. Santyl cream topically 1 application daily p.r.n. dry skin. 3. Gabapentin 400 mg p.o. q.i.d. 4. Diovan HCT 160/25 one tablet p.o. daily. 5. Vesicare 10 mg p.o. daily. 6. Septra 400/80 one tablet p.o. b.i.d. (The patient states he is no     longer taking). 7. Rapaflo 8 mg p.o. daily. 8. Niaspan 1000 mg p.o. nightly. 9. Losartan 50 mg p.o. daily. 10.Lexapro 10 mg p.o. daily. 11.Vicodin 5/500 one tablet p.o. q.4 h. p.r.n. pain. 12.Cipro 750 mg p.o. b.i.d. (Unclear as to why he is on this, but his     last dose was on June 24th). 13.Bupropion XL 300 mg p.o. daily. 14.Atorvastatin 80 mg p.o. nightly. 15.Enteric-coated aspirin 325 mg p.o. daily. 16.Amlodipine 10 mg p.o. daily.  REVIEW OF SYSTEMS:  CONSTITUTIONAL:  Hot feeling, but no frank fever. No chills.  Appetite is diminished.  No frank weight loss.  HEENT: Wears glasses.  CARDIOVASCULAR:  No chest pain or dysrhythmia. RESPIRATORY:  Chronic dyspnea on exertion, cough, occasionally productive.  GI:  Positive for intermittent diarrhea, occasional blood in the stools.  No nausea or vomiting.  GU:  No dysuria or hematuria. MUSCULOSKELETAL:  Chronic back, lower extremity, and hip pain.  PHYSICAL EXAMINATION:  VITAL SIGNS:  Temperature 97.5, pulse 85, respirations 20, blood pressure 84/40. GENERAL:  Obese, Caucasian male, in no acute distress. HEENT:  Normocephalic, atraumatic.  PERRL.  EOMI.  Oropharynx is clear. He does have a cyanotic tinge  to his lips. NECK:  Supple, no thyromegaly, no lymphadenopathy, no jugular venous distention. CHEST:  Lungs clear to auscultation, diminished at the bases. HEART:  Regular rate, rhythm.  No murmurs, rubs, or gallops. ABDOMEN:  Obese, soft, positive bowel sounds, nontender. EXTREMITIES:  Left lower extremity greater than right lower extremity edema with venous stasis dermatitis and hemosiderin deposits.  He does have a right heel stage II ulcer, approximately 1.5 cm. SKIN:  Otherwise warm and dry. NEUROLOGIC:  The patient is alert and oriented x3.  Cranial nerves II through XII grossly intact.  Nonfocal.  Generalized weakness throughout.  DATA REVIEW:  Chest x-ray shows no acute findings.  LABORATORY DATA:  Arterial blood gas shows a pH of 7.429, pCO2 of 33.8, pO2 of 80.  White blood cell count is 8.8, hemoglobin 11.9, hematocrit 37, platelets 197.  Fecal occult blood testing is positive. Procalcitonin is 0.22.  Sodium is 136, potassium 3.6, chloride 100, bicarbonate 26, BUN 31, creatinine 2.63.  Total bilirubin 0.5, alkaline phosphatase 106, AST 60, ALT 13, total protein 6.7, albumin 3.1, calcium 8.9.  Lactic acid is elevated at 3.  ASSESSMENT AND PLAN: 1. Acute renal failure/hypotension/dehydration:  The patients'     dehydration and renal failure are out of proportion to his diarrhea     history.  Nevertheless, we will vigorously hydrate him as tolerated     and monitor his renal function closely.  His baseline creatinine is     1.11.  We will hold all of his antihypertensives at this time and     rule out sepsis and pulmonary embolism.  The patient does not have     an elevated white blood cell count and there is no obvious septic     focus, although he does have some chronic lower extremity wounds.     The wounds do not look infected, but we will check 2 sets of blood     cultures.  The patient's chest x-ray is clear.  He is not     complaining of dysuria, but we will check a  urinalysis and culture.     We will hold antibiotics at this time.  Given his chronic lower     extremity edema, would also rule out pulmonary embolism with lower     extremity Dopplers and D-dimer screening. 2. Diarrhea:  Again, the patient is not endorsing excessive diarrhea     and it is only a 1-day duration.  Nevertheless, he has been on     antibiotics in the outpatient setting and we do need to rule out     Clostridium difficile by PCR.  We will also send off stool     cultures.  The patient's wife has not had similar symptoms and they  have been eating the same foods. 3. Type 2 diabetes:  We will hold the hold the patient's 70/30 insulin     and put him on sliding scale since his p.o. intake is likely not at     what he normally would eat when feeling well. 4. Anxiety/depression:  Monitor the patient's mental status closely.     He is not currently on anxiolytic medications with the exception of     bupropion and Lexapro which we will continue. 5. Neuropathy:  Continue the patient's Neurontin. 6. History of prostate cancer:  Continue the patient's Vesicare and     Rapaflo. 7. Dyslipidemia:  Continue the patient's Niaspan and atorvastatin. 8. Lower extremity wound:  We will get a wound care consultation per     nursing for recommendations regarding appropriate care of his lower     extremity wounds. 9. Prophylaxis:  Place the patient on Lovenox for deep vein thrombosis     prophylaxis.  Time spent on admission including face-to-face time equals approximately 1 hour.     Hillery Aldo, M.D.     CR/MEDQ  D:  06/06/2011  T:  06/07/2011  Job:  [id number]  cc:   Dr. Sondra Come on [address] [facility] [address]  Electronically Signed by Hillery Aldo M.D. on 06/13/2011 07:23:58 AM

## 2011-06-14 LAB — BASIC METABOLIC PANEL
BUN: 8 mg/dL (ref 6–23)
CO2: 24 mEq/L (ref 19–32)
Calcium: 8.4 mg/dL (ref 8.4–10.5)
Creatinine, Ser: 0.76 mg/dL (ref 0.50–1.35)
GFR calc non Af Amer: >60 mL/min (ref >60)
Glucose, Bld: 124 mg/dL — ABNORMAL HIGH (ref 70–99)

## 2011-06-14 LAB — GLUCOSE, CAPILLARY: Glucose-Capillary: 188 mg/dL — ABNORMAL HIGH (ref 70–99)

## 2011-06-15 LAB — BASIC METABOLIC PANEL
BUN: 8 mg/dL (ref 6–23)
CO2: 25 mEq/L (ref 19–32)
Chloride: 110 mEq/L (ref 96–112)
GFR calc Af Amer: >60 mL/min (ref >60)
Glucose, Bld: 114 mg/dL — ABNORMAL HIGH (ref 70–99)
Potassium: 3.4 mEq/L — ABNORMAL LOW (ref 3.5–5.1)

## 2011-06-15 LAB — CBC
HCT: 32.4 % — ABNORMAL LOW (ref 39.0–52.0)
Hemoglobin: 10.8 g/dL — ABNORMAL LOW (ref 13.0–17.0)
RBC: 3.65 MIL/uL — ABNORMAL LOW (ref 4.22–5.81)
RDW: 14.3 % (ref 11.5–15.5)
WBC: 5.6 10*3/uL (ref 4.0–10.5)

## 2011-06-15 LAB — GLUCOSE, CAPILLARY
Glucose-Capillary: 136 mg/dL — ABNORMAL HIGH (ref 70–99)
Glucose-Capillary: 167 mg/dL — ABNORMAL HIGH (ref 70–99)

## 2011-06-16 LAB — GLUCOSE, CAPILLARY
Glucose-Capillary: 138 mg/dL — ABNORMAL HIGH (ref 70–99)
Glucose-Capillary: 145 mg/dL — ABNORMAL HIGH (ref 70–99)
Glucose-Capillary: 141 mg/dL — ABNORMAL HIGH (ref 70–99)
Glucose-Capillary: 151 mg/dL — ABNORMAL HIGH (ref 70–99)

## 2011-06-16 LAB — BASIC METABOLIC PANEL
Calcium: 8.4 mg/dL (ref 8.4–10.5)
GFR calc Af Amer: >60 mL/min (ref >60)
GFR calc non Af Amer: >60 mL/min (ref >60)
Glucose, Bld: 103 mg/dL — ABNORMAL HIGH (ref 70–99)
Potassium: 4.2 mEq/L (ref 3.5–5.1)
Sodium: 142 mEq/L (ref 135–145)

## 2011-06-17 NOTE — Discharge Summary (Signed)
NAMECATHY, [name] NO.:  [id number]  MEDICAL RECORD NO.:  [id number]  LOCATION:  1435                         FACILITY:  [facility]  PHYSICIAN:  [name], M.D.   DATE OF BIRTH:  16-Jun-1941  DATE OF ADMISSION:  06/06/2011 DATE OF DISCHARGE:  06/16/2011                        DISCHARGE SUMMARY - REFERRING   PRIMARY CARE PHYSICIAN:  Dr. Tenny Craw at [place] on [address].  DISCHARGE DIAGNOSES: 1. Clostridium difficile colitis. 2. Dehydration. 3. Acute renal failure. 4. Sepsis syndrome with hypotension. 5. Coagulase-negative staph bacteremia, thought to be a contaminant. 6. Deconditioning. 7. Hypertension. 8. Anxiety/depression. 9. History of prostate cancer. 10.Peripheral neuropathy. 11.Hypokalemia. 12.Chronic back pain. 13.Dyslipidemia. 14.Type 2 diabetes. 15.Chronic lower extremity wounds. 16.Normocytic anemia.  DISCHARGE MEDICATIONS.: 1. Vicodin 5/325 1 tablet p.o. q.4 h p.r.n. pain. 2. Hydroxyzine 50 mg p.o. q.h.s. 3. Levsin 0.125 mg p.o. t.i.d. p.r.n. abdominal cramping. 4. NovoLog sliding scale insulin q.a.c. and h.s. 5. NovoLog 3 units subcutaneously q.a.c. meal coverage. 6. Metronidazole 500 mg p.o. q.8 h x7 days. 7. Potassium chloride 20 mEq p.o. b.i.d. 8. Florastor 250 mg p.o. b.i.d. 9. Zinc oxide 20% one application topically to the lower extremities. 10.Ambien 5 mg p.o. q.h.s. p.r.n. sleep. 11.Aspirin 325 mg p.o. q.h.s. 12.Atorvastatin 80 mg p.o. q.h.s. 13.Bupropion XL 300 mg p.o. daily. 14.Gabapentin 400 mg p.o. q.i.d. 15.Lexapro 10 mg p.o. daily. 16.Losartan 50 mg p.o. daily. 17.Niaspan 1000 mg p.o. q.h.s. 18.Rapaflo 8 mg p.o. daily. 19.Santyl cream topically p.r.n. 20.Vesicare 10 mg p.o. daily.  CONSULTATIONS: 1. Wound care RN. 2. Physical therapy. 3. Occupational Therapy. 4. Pharmacy for transient vancomycin dosing. 5. Inpatient diabetes program coordinator.  BRIEF ADMISSION HPI:  The patient is a 70 year old male  who presented to the hospital with a chief complaint of 1-day history of diarrhea and weakness.  Upon EMS arrival, the patient was found to be hypotensive. He subsequently was brought to the hospital where he was referred to the hospitalist service for further inpatient evaluation and treatment.  For full details, please see my dictated H and P.  PROCEDURES AND DIAGNOSTIC STUDIES: 1. Chest x-ray on June 06, 2011 showed no acute findings. 2. V/Q scan on June 07, 2011 showed a solitary small segmental     perfusion defect in the left upper lobe without matching     ventilation or chest x-ray abnormality.  This was felt to     constitute a very low probability of pulmonary embolism.  Mild air     trapping in the right upper lobe consistent with COPD or asthma. 3. Lower extremity Dopplers on June 07, 2011, showed no evidence of     DVT or superficial thrombosis involving the right lower extremity     and left lower extremity.  No evidence of Baker cyst on the right     or left.  DISCHARGE LABORATORY VALUES:  Sodium was 142, potassium 4.2, chloride 112, bicarb 25, BUN 11, creatinine 0.82, glucose 103, calcium 8.4. Magnesium was 1.8.  White blood cell count was 5.6, hemoglobin 10.8, hematocrit 32.4, platelets 213.  One of 2 blood cultures grew coagulase-negative staph.  Urine culture was negative.  Clostridium difficile by PCR was positive.  TSH  was 1.241.  MRSA by PCR nasal swab was negative.  Cardiac markers showed a nonspecific elevation of CK and CK-MB and normal troponin.  D-dimer was elevated at 1.71 prompting the VQ scan.  HOSPITAL COURSE BY PROBLEM: 1. Clostridium difficile colitis:  The patient has completed 7 days     out of a planned course of 14 days of therapy of Flagyl.  He was     also put on Florastor and his clinical symptoms are improving     daily.  He was treated with antibiotics prior to admission and was     at high risk for Clostridium difficile. 2.  Dehydration/hypotension/sepsis syndrome:  The patient was initially     hypotensive but responded to IV fluid boluses.  His     antihypertensives were held and at this time some of them have been     resumed.  His discharge blood pressure is 142/83 and consideration     for resuming his remaining antihypertensives can be made as an     outpatient. 3. Acute renal failure:  The patient's creatinine on presentation was     2.63.  His discharge creatinine is 0.82 indicating complete     resolution of his acute renal failure which was likely induced by     dehydration and hypotension. 4. Coagulase-negative staph bacteremia:  Only one of two cultures was     positive and this was felt to be contaminant. 5. Deconditioning:  The patient was seen and evaluated by physical and     occupational therapy.  A skilled nursing home placement for     rehabilitation was recommended.  The patient was initially     resistant but has now consented to be discharged to rehab for     further care. 6. Hypertension:  The patient was hypotensive early in the course of     his hospital stay.  At this time, his losartan has been resumed but     his other antihypertensives remain on hold.  Consideration for     resuming these can be made as an outpatient. 7. Anxiety/depression:  The patient was maintained on his usual doses     of Wellbutrin and Lexapro. 8. History of prostate cancer:  The patient has been maintained on his     usual doses of Enablex and Rapaflo. 9. Peripheral neuropathy:  The patient has been maintained on     Neurontin therapy. 10.Hypokalemia:  The patient's potassium was repleted and he is being     discharged on supplementation doses.  Consideration for     discontinuation of potassium can be made after his diarrhea clears. 11.Chronic back pain:  The patient is provided with pain medications     p.r.n. 12.Dyslipidemia:  The patient was maintained on his usual dose of     Lipitor and  Niaspan. 13.Type 2 diabetes:  The patient was discontinued from 70/30 insulin     and put on a combination of sliding scale and meal coverage.  His     glycemic control on this regimen is excellent with his sugars     ranging 104 to 167 over the past 24 hours. 14.Chronic lower extremity wounds:  The patient was seen by the wound     care nurse with wound care instructions provided.  He should     continue to receive wound care in the outpatient environment. 15.Normocytic anemia:  The patient's hemoglobin/hematocrit have been     stable.  He likely  has a element of anemia of chronic disease,     although a screening fecal occult test was positive but difficult     to interpret in the setting of known Clostridium difficile     infection.  Consideration for an outpatient GI follow-up and     further anemia workup can be made by the patient's primary care     physician.  DISPOSITION:  The patient is medically stable and will be discharged to skilled nursing facility today for rehab.  DISCHARGE INSTRUCTIONS:  ACTIVITY:  Increase activity slowly.  Walk with assistance with a walker.  May shower/bathe.  DIET:  Low sodium heart-healthy diabetic diet.  No alcohol while on Flagyl.  WOUND CARE:  Cleanse bilateral lower extremities with normal saline and pat dry daily.  Apply Xeroform gauze to the right pretibial area and right heel and cover with 4x4 gauze and wrapped with Kerlix.  Apply zinc oxide to the pretibial ulcer on the left and cover with 4x4s and wrap with Kerlix.  FOLLOW-UP:  With Dr. Artist Pais or Dr. Tenny Craw at [employer] an 1 to 2 weeks.  Call for an appointment.  CONDITION ON DISCHARGE:  Improved.  Time spent coordinating care for discharge and discharge instructions including face-to-face time equals approximately 40 minutes.     [name], M.D.     CR/MEDQ  D:  06/16/2011  T:  06/16/2011  Job:  [id number]  cc:   Dr. Tenny Craw  Electronically Signed by [name] M.D. on  06/17/2011 03:57:00 PM

## 2011-06-25 NOTE — Progress Notes (Signed)
NAME:  [name], [name]               ACCOUNT NO.:  [id number]  MEDICAL RECORD NO.:  [id number]  LOCATION:  1435                         FACILITY:  [facility]  PHYSICIAN:  Kela Millin, M.D.DATE OF BIRTH:  1941/06/15                                PROGRESS NOTE   CURRENT DIAGNOSES: 1. Clostridium difficile colitis. 2. Hypotension - secondary to above, resolved. 3. Acute renal failure - resolved. 4. Staphylococcus species bacteremia in 1 out of 2 bottles -     contaminant. 5. Deconditioning - awaiting rehab. 6. Hypertension. 7. Anxiety/depression. 8. History of prostate cancer, status post radiation therapy. 9. History of peripheral neuropathy. 10.Chronic back pain. 11.History of hyperlipidemia. 12.History of venous stasis dermatitis and chronic lower extremity     wounds bilaterally. 13.Type 2 diabetes mellitus.  PROCEDURES AND STUDIES: 1. Chest x-ray on June 06, 2011 - no acute findings. 2. VQ scan - solitary small segmental perfusion defect in the left     upper lobe without matching ventilation or chest x-ray abnormality.     Very low probability of PE.  Mild air trapping in the right upper     lobe consistent with COPD and/or asthma. 3. Lower extremity Doppler's - no obvious evidence of DVT, superficial     thrombosis or Baker cyst.  BRIEF HISTORY: The patient is a 70 year old white male with the above-listed medical problems who presented with a 1-day history of diarrhea.  He stated that the diarrhea had been intermittent.  He stated that he had some weakness and his symptoms had progressed in the several days prior to admission and he was beginning to have dizziness and so they called EMS.  The patient's wife reported that the patient had become increasingly lethargic, sleeping for several hours of the day and was weak.  He complained of right lower extremity pain as well as hip and back pain that is chronic.  The patient stated that he had noted a slight amount of  blood in his stool, but that it was nothing significant.  In the ED, he was found to be hypotensive and was admitted for further evaluation and management.  HOSPITAL COURSE: 1. Clostridium difficile diarrhea - the patient had C difficile     studies done and they came back positive for C difficile and he was     placed on Flagyl and Florastor.  He is to complete a 2-week course     of these antibiotics.  He is tolerating p.o.'s at this time and has     had no diarrhea today. 2. Hypotension - secondary to #1 - the patient was hydrated with IV     fluids and #1 treated with antibiotics and with this, his     hypotension has resolved and he is remaining hemodynamically     stable.  His losartan has been restarted at this time. 3. Acute renal failure - impression was that this was secondary to the     volume depletion/hypotension, resolved with hydration.  His     creatinine today is 0.8 with a BUN of 10. 4. Type 2 diabetes mellitus - his Accu-Cheks have been monitored and     he has been  covered with sliding scale insulin. 5. Venous stasis dermatitis, on chronic lower extremity wounds     bilaterally - Wound Care was consulted and they have followed the     patient during this hospital stay for local wound care. 6. Peripheral neuropathy - the patient was maintained on his     gabapentin during this hospital stay. 7. His other chronic medical conditions remained stable during this     hospital stay. 8. The discharging hospitalist to dictate the rest of his hospital     course as well as his final discharge medications.     Kela Millin, M.D.     ACV/MEDQ  D:  06/13/2011  T:  06/13/2011  Job:  [id number]  Electronically Signed by Donnalee Curry M.D. on 06/25/2011 07:50:33 AM

## 2012-01-04 DIAGNOSIS — C61 Malignant neoplasm of prostate: Secondary | ICD-10-CM | POA: Diagnosis not present

## 2012-01-10 DIAGNOSIS — N3941 Urge incontinence: Secondary | ICD-10-CM | POA: Diagnosis not present

## 2012-01-10 DIAGNOSIS — N401 Enlarged prostate with lower urinary tract symptoms: Secondary | ICD-10-CM | POA: Diagnosis not present

## 2012-01-10 DIAGNOSIS — C61 Malignant neoplasm of prostate: Secondary | ICD-10-CM | POA: Diagnosis not present

## 2012-01-10 DIAGNOSIS — R972 Elevated prostate specific antigen [PSA]: Secondary | ICD-10-CM | POA: Diagnosis not present

## 2012-07-15 DIAGNOSIS — I872 Venous insufficiency (chronic) (peripheral): Secondary | ICD-10-CM | POA: Diagnosis not present

## 2012-07-15 DIAGNOSIS — R209 Unspecified disturbances of skin sensation: Secondary | ICD-10-CM | POA: Diagnosis not present

## 2012-07-15 DIAGNOSIS — E119 Type 2 diabetes mellitus without complications: Secondary | ICD-10-CM | POA: Diagnosis not present

## 2012-07-15 DIAGNOSIS — E782 Mixed hyperlipidemia: Secondary | ICD-10-CM | POA: Diagnosis not present

## 2012-07-15 DIAGNOSIS — Z8546 Personal history of malignant neoplasm of prostate: Secondary | ICD-10-CM | POA: Diagnosis not present

## 2012-07-15 DIAGNOSIS — M810 Age-related osteoporosis without current pathological fracture: Secondary | ICD-10-CM | POA: Diagnosis not present

## 2012-07-15 DIAGNOSIS — G609 Hereditary and idiopathic neuropathy, unspecified: Secondary | ICD-10-CM | POA: Diagnosis not present

## 2012-07-15 DIAGNOSIS — I10 Essential (primary) hypertension: Secondary | ICD-10-CM | POA: Diagnosis not present

## 2012-07-29 DIAGNOSIS — E782 Mixed hyperlipidemia: Secondary | ICD-10-CM | POA: Diagnosis not present

## 2012-07-29 DIAGNOSIS — E119 Type 2 diabetes mellitus without complications: Secondary | ICD-10-CM | POA: Diagnosis not present

## 2012-07-29 DIAGNOSIS — F329 Major depressive disorder, single episode, unspecified: Secondary | ICD-10-CM | POA: Diagnosis not present

## 2012-07-29 DIAGNOSIS — Z8546 Personal history of malignant neoplasm of prostate: Secondary | ICD-10-CM | POA: Diagnosis not present

## 2012-07-29 DIAGNOSIS — I10 Essential (primary) hypertension: Secondary | ICD-10-CM | POA: Diagnosis not present

## 2012-07-29 DIAGNOSIS — G609 Hereditary and idiopathic neuropathy, unspecified: Secondary | ICD-10-CM | POA: Diagnosis not present

## 2012-11-25 DIAGNOSIS — G4700 Insomnia, unspecified: Secondary | ICD-10-CM | POA: Diagnosis not present

## 2012-11-25 DIAGNOSIS — F329 Major depressive disorder, single episode, unspecified: Secondary | ICD-10-CM | POA: Diagnosis not present

## 2012-11-25 DIAGNOSIS — M1990 Unspecified osteoarthritis, unspecified site: Secondary | ICD-10-CM | POA: Diagnosis not present

## 2012-11-25 DIAGNOSIS — E119 Type 2 diabetes mellitus without complications: Secondary | ICD-10-CM | POA: Diagnosis not present

## 2013-03-03 DIAGNOSIS — C61 Malignant neoplasm of prostate: Secondary | ICD-10-CM | POA: Diagnosis not present

## 2013-03-03 DIAGNOSIS — N3941 Urge incontinence: Secondary | ICD-10-CM | POA: Diagnosis not present

## 2013-04-01 DIAGNOSIS — M1990 Unspecified osteoarthritis, unspecified site: Secondary | ICD-10-CM | POA: Diagnosis not present

## 2013-04-01 DIAGNOSIS — Z23 Encounter for immunization: Secondary | ICD-10-CM | POA: Diagnosis not present

## 2013-04-01 DIAGNOSIS — I10 Essential (primary) hypertension: Secondary | ICD-10-CM | POA: Diagnosis not present

## 2013-04-01 DIAGNOSIS — E119 Type 2 diabetes mellitus without complications: Secondary | ICD-10-CM | POA: Diagnosis not present

## 2013-04-01 DIAGNOSIS — E782 Mixed hyperlipidemia: Secondary | ICD-10-CM | POA: Diagnosis not present

## 2013-04-01 DIAGNOSIS — G609 Hereditary and idiopathic neuropathy, unspecified: Secondary | ICD-10-CM | POA: Diagnosis not present

## 2013-04-01 DIAGNOSIS — F329 Major depressive disorder, single episode, unspecified: Secondary | ICD-10-CM | POA: Diagnosis not present

## 2013-05-28 DIAGNOSIS — K1370 Unspecified lesions of oral mucosa: Secondary | ICD-10-CM | POA: Diagnosis not present

## 2013-06-03 DIAGNOSIS — K1370 Unspecified lesions of oral mucosa: Secondary | ICD-10-CM | POA: Diagnosis not present

## 2013-10-20 DIAGNOSIS — I10 Essential (primary) hypertension: Secondary | ICD-10-CM | POA: Diagnosis not present

## 2013-10-20 DIAGNOSIS — E782 Mixed hyperlipidemia: Secondary | ICD-10-CM | POA: Diagnosis not present

## 2013-10-20 DIAGNOSIS — F329 Major depressive disorder, single episode, unspecified: Secondary | ICD-10-CM | POA: Diagnosis not present

## 2013-10-20 DIAGNOSIS — M1990 Unspecified osteoarthritis, unspecified site: Secondary | ICD-10-CM | POA: Diagnosis not present

## 2013-10-20 DIAGNOSIS — E1149 Type 2 diabetes mellitus with other diabetic neurological complication: Secondary | ICD-10-CM | POA: Diagnosis not present

## 2013-10-20 DIAGNOSIS — R195 Other fecal abnormalities: Secondary | ICD-10-CM | POA: Diagnosis not present

## 2013-10-20 DIAGNOSIS — G609 Hereditary and idiopathic neuropathy, unspecified: Secondary | ICD-10-CM | POA: Diagnosis not present

## 2013-10-20 DIAGNOSIS — Z23 Encounter for immunization: Secondary | ICD-10-CM | POA: Diagnosis not present

## 2014-02-04 DIAGNOSIS — H2510 Age-related nuclear cataract, unspecified eye: Secondary | ICD-10-CM | POA: Diagnosis not present

## 2014-02-04 DIAGNOSIS — E119 Type 2 diabetes mellitus without complications: Secondary | ICD-10-CM | POA: Diagnosis not present

## 2014-02-10 DIAGNOSIS — Z1211 Encounter for screening for malignant neoplasm of colon: Secondary | ICD-10-CM | POA: Diagnosis not present

## 2014-03-02 DIAGNOSIS — H2510 Age-related nuclear cataract, unspecified eye: Secondary | ICD-10-CM | POA: Diagnosis not present

## 2014-03-16 DIAGNOSIS — H269 Unspecified cataract: Secondary | ICD-10-CM | POA: Diagnosis not present

## 2014-03-16 DIAGNOSIS — H2510 Age-related nuclear cataract, unspecified eye: Secondary | ICD-10-CM | POA: Diagnosis not present

## 2014-03-16 DIAGNOSIS — H2589 Other age-related cataract: Secondary | ICD-10-CM | POA: Diagnosis not present

## 2014-04-16 DIAGNOSIS — C61 Malignant neoplasm of prostate: Secondary | ICD-10-CM | POA: Diagnosis not present

## 2014-04-16 DIAGNOSIS — B3749 Other urogenital candidiasis: Secondary | ICD-10-CM | POA: Diagnosis not present

## 2014-04-16 DIAGNOSIS — N3941 Urge incontinence: Secondary | ICD-10-CM | POA: Diagnosis not present

## 2014-04-21 DIAGNOSIS — E782 Mixed hyperlipidemia: Secondary | ICD-10-CM | POA: Diagnosis not present

## 2014-04-21 DIAGNOSIS — I10 Essential (primary) hypertension: Secondary | ICD-10-CM | POA: Diagnosis not present

## 2014-04-21 DIAGNOSIS — F329 Major depressive disorder, single episode, unspecified: Secondary | ICD-10-CM | POA: Diagnosis not present

## 2014-04-21 DIAGNOSIS — Z23 Encounter for immunization: Secondary | ICD-10-CM | POA: Diagnosis not present

## 2014-04-21 DIAGNOSIS — E1149 Type 2 diabetes mellitus with other diabetic neurological complication: Secondary | ICD-10-CM | POA: Diagnosis not present

## 2014-04-21 DIAGNOSIS — E1129 Type 2 diabetes mellitus with other diabetic kidney complication: Secondary | ICD-10-CM | POA: Diagnosis not present

## 2014-04-21 DIAGNOSIS — M1990 Unspecified osteoarthritis, unspecified site: Secondary | ICD-10-CM | POA: Diagnosis not present

## 2014-04-24 DIAGNOSIS — H2510 Age-related nuclear cataract, unspecified eye: Secondary | ICD-10-CM | POA: Diagnosis not present

## 2014-05-20 DIAGNOSIS — N3941 Urge incontinence: Secondary | ICD-10-CM | POA: Diagnosis not present

## 2014-05-20 DIAGNOSIS — N401 Enlarged prostate with lower urinary tract symptoms: Secondary | ICD-10-CM | POA: Diagnosis not present

## 2014-05-20 DIAGNOSIS — C61 Malignant neoplasm of prostate: Secondary | ICD-10-CM | POA: Diagnosis not present

## 2014-05-20 DIAGNOSIS — N139 Obstructive and reflux uropathy, unspecified: Secondary | ICD-10-CM | POA: Diagnosis not present

## 2014-06-01 DIAGNOSIS — H2510 Age-related nuclear cataract, unspecified eye: Secondary | ICD-10-CM | POA: Diagnosis not present

## 2014-06-01 DIAGNOSIS — H269 Unspecified cataract: Secondary | ICD-10-CM | POA: Diagnosis not present

## 2014-06-01 DIAGNOSIS — H2589 Other age-related cataract: Secondary | ICD-10-CM | POA: Diagnosis not present

## 2014-07-15 DIAGNOSIS — H26499 Other secondary cataract, unspecified eye: Secondary | ICD-10-CM | POA: Diagnosis not present

## 2014-10-23 DIAGNOSIS — H26491 Other secondary cataract, right eye: Secondary | ICD-10-CM | POA: Diagnosis not present

## 2014-11-12 DIAGNOSIS — C61 Malignant neoplasm of prostate: Secondary | ICD-10-CM | POA: Diagnosis not present

## 2014-11-12 DIAGNOSIS — N3941 Urge incontinence: Secondary | ICD-10-CM | POA: Diagnosis not present

## 2014-11-19 DIAGNOSIS — Z23 Encounter for immunization: Secondary | ICD-10-CM | POA: Diagnosis not present

## 2014-11-19 DIAGNOSIS — I10 Essential (primary) hypertension: Secondary | ICD-10-CM | POA: Diagnosis not present

## 2014-11-19 DIAGNOSIS — E1140 Type 2 diabetes mellitus with diabetic neuropathy, unspecified: Secondary | ICD-10-CM | POA: Diagnosis not present

## 2014-11-19 DIAGNOSIS — F329 Major depressive disorder, single episode, unspecified: Secondary | ICD-10-CM | POA: Diagnosis not present

## 2014-11-19 DIAGNOSIS — E1121 Type 2 diabetes mellitus with diabetic nephropathy: Secondary | ICD-10-CM | POA: Diagnosis not present

## 2014-11-19 DIAGNOSIS — Z8546 Personal history of malignant neoplasm of prostate: Secondary | ICD-10-CM | POA: Diagnosis not present

## 2014-11-19 DIAGNOSIS — M5136 Other intervertebral disc degeneration, lumbar region: Secondary | ICD-10-CM | POA: Diagnosis not present

## 2014-11-19 DIAGNOSIS — E782 Mixed hyperlipidemia: Secondary | ICD-10-CM | POA: Diagnosis not present

## 2015-02-26 DIAGNOSIS — I10 Essential (primary) hypertension: Secondary | ICD-10-CM | POA: Diagnosis not present

## 2015-02-26 DIAGNOSIS — E1140 Type 2 diabetes mellitus with diabetic neuropathy, unspecified: Secondary | ICD-10-CM | POA: Diagnosis not present

## 2015-02-26 DIAGNOSIS — Z8546 Personal history of malignant neoplasm of prostate: Secondary | ICD-10-CM | POA: Diagnosis not present

## 2015-02-26 DIAGNOSIS — E1121 Type 2 diabetes mellitus with diabetic nephropathy: Secondary | ICD-10-CM | POA: Diagnosis not present

## 2015-02-26 DIAGNOSIS — I83009 Varicose veins of unspecified lower extremity with ulcer of unspecified site: Secondary | ICD-10-CM | POA: Diagnosis not present

## 2015-02-26 DIAGNOSIS — E782 Mixed hyperlipidemia: Secondary | ICD-10-CM | POA: Diagnosis not present

## 2015-02-26 DIAGNOSIS — M5136 Other intervertebral disc degeneration, lumbar region: Secondary | ICD-10-CM | POA: Diagnosis not present

## 2015-05-17 DIAGNOSIS — N3941 Urge incontinence: Secondary | ICD-10-CM | POA: Diagnosis not present

## 2015-05-17 DIAGNOSIS — C61 Malignant neoplasm of prostate: Secondary | ICD-10-CM | POA: Diagnosis not present

## 2015-05-17 DIAGNOSIS — T66XXXS Radiation sickness, unspecified, sequela: Secondary | ICD-10-CM | POA: Diagnosis not present

## 2015-05-17 DIAGNOSIS — N5201 Erectile dysfunction due to arterial insufficiency: Secondary | ICD-10-CM | POA: Diagnosis not present

## 2015-06-08 ENCOUNTER — Encounter (HOSPITAL_BASED_OUTPATIENT_CLINIC_OR_DEPARTMENT_OTHER): Payer: Medicare Other | Attending: General Surgery

## 2015-06-08 DIAGNOSIS — E11622 Type 2 diabetes mellitus with other skin ulcer: Secondary | ICD-10-CM | POA: Diagnosis not present

## 2015-06-08 DIAGNOSIS — E1140 Type 2 diabetes mellitus with diabetic neuropathy, unspecified: Secondary | ICD-10-CM | POA: Diagnosis not present

## 2015-06-08 DIAGNOSIS — E785 Hyperlipidemia, unspecified: Secondary | ICD-10-CM | POA: Diagnosis not present

## 2015-06-08 DIAGNOSIS — L97821 Non-pressure chronic ulcer of other part of left lower leg limited to breakdown of skin: Secondary | ICD-10-CM | POA: Diagnosis not present

## 2015-06-08 DIAGNOSIS — I10 Essential (primary) hypertension: Secondary | ICD-10-CM | POA: Diagnosis not present

## 2015-06-08 DIAGNOSIS — Z8546 Personal history of malignant neoplasm of prostate: Secondary | ICD-10-CM | POA: Insufficient documentation

## 2015-06-08 DIAGNOSIS — Z79899 Other long term (current) drug therapy: Secondary | ICD-10-CM | POA: Diagnosis not present

## 2015-06-08 DIAGNOSIS — Z6832 Body mass index (BMI) 32.0-32.9, adult: Secondary | ICD-10-CM | POA: Insufficient documentation

## 2015-06-08 DIAGNOSIS — L97321 Non-pressure chronic ulcer of left ankle limited to breakdown of skin: Secondary | ICD-10-CM | POA: Insufficient documentation

## 2015-06-08 DIAGNOSIS — M160 Bilateral primary osteoarthritis of hip: Secondary | ICD-10-CM | POA: Diagnosis not present

## 2015-06-08 DIAGNOSIS — I739 Peripheral vascular disease, unspecified: Secondary | ICD-10-CM | POA: Diagnosis not present

## 2015-06-08 DIAGNOSIS — L97811 Non-pressure chronic ulcer of other part of right lower leg limited to breakdown of skin: Secondary | ICD-10-CM | POA: Diagnosis not present

## 2015-06-08 DIAGNOSIS — Z923 Personal history of irradiation: Secondary | ICD-10-CM | POA: Insufficient documentation

## 2015-06-08 DIAGNOSIS — Z794 Long term (current) use of insulin: Secondary | ICD-10-CM | POA: Diagnosis not present

## 2015-06-08 LAB — GLUCOSE, CAPILLARY: GLUCOSE-CAPILLARY: 171 mg/dL — AB (ref 65–99)

## 2015-06-15 ENCOUNTER — Encounter (HOSPITAL_BASED_OUTPATIENT_CLINIC_OR_DEPARTMENT_OTHER): Payer: Medicare Other | Attending: General Surgery

## 2015-06-15 DIAGNOSIS — M1990 Unspecified osteoarthritis, unspecified site: Secondary | ICD-10-CM | POA: Diagnosis not present

## 2015-06-15 DIAGNOSIS — L97321 Non-pressure chronic ulcer of left ankle limited to breakdown of skin: Secondary | ICD-10-CM | POA: Insufficient documentation

## 2015-06-15 DIAGNOSIS — L97821 Non-pressure chronic ulcer of other part of left lower leg limited to breakdown of skin: Secondary | ICD-10-CM | POA: Diagnosis not present

## 2015-06-15 DIAGNOSIS — E11622 Type 2 diabetes mellitus with other skin ulcer: Secondary | ICD-10-CM | POA: Diagnosis not present

## 2015-06-15 DIAGNOSIS — E1140 Type 2 diabetes mellitus with diabetic neuropathy, unspecified: Secondary | ICD-10-CM | POA: Insufficient documentation

## 2015-06-15 DIAGNOSIS — Z923 Personal history of irradiation: Secondary | ICD-10-CM | POA: Diagnosis not present

## 2015-06-15 DIAGNOSIS — I10 Essential (primary) hypertension: Secondary | ICD-10-CM | POA: Insufficient documentation

## 2015-06-15 DIAGNOSIS — E1151 Type 2 diabetes mellitus with diabetic peripheral angiopathy without gangrene: Secondary | ICD-10-CM | POA: Diagnosis not present

## 2015-06-15 DIAGNOSIS — L97811 Non-pressure chronic ulcer of other part of right lower leg limited to breakdown of skin: Secondary | ICD-10-CM | POA: Insufficient documentation

## 2015-06-22 DIAGNOSIS — L97811 Non-pressure chronic ulcer of other part of right lower leg limited to breakdown of skin: Secondary | ICD-10-CM | POA: Diagnosis not present

## 2015-06-22 DIAGNOSIS — E11622 Type 2 diabetes mellitus with other skin ulcer: Secondary | ICD-10-CM | POA: Diagnosis not present

## 2015-06-22 DIAGNOSIS — I10 Essential (primary) hypertension: Secondary | ICD-10-CM | POA: Diagnosis not present

## 2015-06-22 DIAGNOSIS — L97321 Non-pressure chronic ulcer of left ankle limited to breakdown of skin: Secondary | ICD-10-CM | POA: Diagnosis not present

## 2015-06-22 DIAGNOSIS — L97821 Non-pressure chronic ulcer of other part of left lower leg limited to breakdown of skin: Secondary | ICD-10-CM | POA: Diagnosis not present

## 2015-06-22 DIAGNOSIS — E1151 Type 2 diabetes mellitus with diabetic peripheral angiopathy without gangrene: Secondary | ICD-10-CM | POA: Diagnosis not present

## 2015-06-29 DIAGNOSIS — E11622 Type 2 diabetes mellitus with other skin ulcer: Secondary | ICD-10-CM | POA: Diagnosis not present

## 2015-06-29 DIAGNOSIS — E1151 Type 2 diabetes mellitus with diabetic peripheral angiopathy without gangrene: Secondary | ICD-10-CM | POA: Diagnosis not present

## 2015-06-29 DIAGNOSIS — I10 Essential (primary) hypertension: Secondary | ICD-10-CM | POA: Diagnosis not present

## 2015-06-29 DIAGNOSIS — L97321 Non-pressure chronic ulcer of left ankle limited to breakdown of skin: Secondary | ICD-10-CM | POA: Diagnosis not present

## 2015-06-29 DIAGNOSIS — L97821 Non-pressure chronic ulcer of other part of left lower leg limited to breakdown of skin: Secondary | ICD-10-CM | POA: Diagnosis not present

## 2015-06-29 DIAGNOSIS — L97811 Non-pressure chronic ulcer of other part of right lower leg limited to breakdown of skin: Secondary | ICD-10-CM | POA: Diagnosis not present

## 2015-07-09 ENCOUNTER — Ambulatory Visit (HOSPITAL_COMMUNITY)
Admission: RE | Admit: 2015-07-09 | Discharge: 2015-07-09 | Disposition: A | Discharge status: Home / Self Care | Payer: Medicare Other | Source: Ambulatory Visit | Attending: Vascular Surgery | Admitting: Vascular Surgery

## 2015-07-09 ENCOUNTER — Other Ambulatory Visit (HOSPITAL_BASED_OUTPATIENT_CLINIC_OR_DEPARTMENT_OTHER): Payer: Self-pay | Admitting: General Surgery

## 2015-07-09 DIAGNOSIS — I771 Stricture of artery: Secondary | ICD-10-CM

## 2015-07-09 DIAGNOSIS — I8393 Asymptomatic varicose veins of bilateral lower extremities: Secondary | ICD-10-CM | POA: Diagnosis not present

## 2015-07-09 DIAGNOSIS — I82812 Embolism and thrombosis of superficial veins of left lower extremities: Secondary | ICD-10-CM | POA: Insufficient documentation

## 2015-07-09 DIAGNOSIS — L97929 Non-pressure chronic ulcer of unspecified part of left lower leg with unspecified severity: Secondary | ICD-10-CM | POA: Insufficient documentation

## 2015-07-09 DIAGNOSIS — L97919 Non-pressure chronic ulcer of unspecified part of right lower leg with unspecified severity: Secondary | ICD-10-CM

## 2015-07-09 DIAGNOSIS — I872 Venous insufficiency (chronic) (peripheral): Secondary | ICD-10-CM | POA: Diagnosis not present

## 2015-07-13 ENCOUNTER — Encounter (HOSPITAL_BASED_OUTPATIENT_CLINIC_OR_DEPARTMENT_OTHER): Payer: Medicare Other | Attending: General Surgery

## 2015-07-13 DIAGNOSIS — I872 Venous insufficiency (chronic) (peripheral): Secondary | ICD-10-CM | POA: Insufficient documentation

## 2015-07-13 DIAGNOSIS — M1990 Unspecified osteoarthritis, unspecified site: Secondary | ICD-10-CM | POA: Diagnosis not present

## 2015-07-13 DIAGNOSIS — L97221 Non-pressure chronic ulcer of left calf limited to breakdown of skin: Secondary | ICD-10-CM | POA: Insufficient documentation

## 2015-07-13 DIAGNOSIS — I10 Essential (primary) hypertension: Secondary | ICD-10-CM | POA: Diagnosis not present

## 2015-07-13 DIAGNOSIS — F419 Anxiety disorder, unspecified: Secondary | ICD-10-CM | POA: Diagnosis not present

## 2015-07-13 DIAGNOSIS — E11628 Type 2 diabetes mellitus with other skin complications: Secondary | ICD-10-CM | POA: Insufficient documentation

## 2015-07-13 DIAGNOSIS — L97211 Non-pressure chronic ulcer of right calf limited to breakdown of skin: Secondary | ICD-10-CM | POA: Insufficient documentation

## 2015-07-13 DIAGNOSIS — I87333 Chronic venous hypertension (idiopathic) with ulcer and inflammation of bilateral lower extremity: Secondary | ICD-10-CM | POA: Diagnosis not present

## 2015-07-13 DIAGNOSIS — E1140 Type 2 diabetes mellitus with diabetic neuropathy, unspecified: Secondary | ICD-10-CM | POA: Insufficient documentation

## 2015-07-20 DIAGNOSIS — L97211 Non-pressure chronic ulcer of right calf limited to breakdown of skin: Secondary | ICD-10-CM | POA: Diagnosis not present

## 2015-07-20 DIAGNOSIS — E11628 Type 2 diabetes mellitus with other skin complications: Secondary | ICD-10-CM | POA: Diagnosis not present

## 2015-07-20 DIAGNOSIS — I10 Essential (primary) hypertension: Secondary | ICD-10-CM | POA: Diagnosis not present

## 2015-07-20 DIAGNOSIS — I872 Venous insufficiency (chronic) (peripheral): Secondary | ICD-10-CM | POA: Diagnosis not present

## 2015-07-20 DIAGNOSIS — I87333 Chronic venous hypertension (idiopathic) with ulcer and inflammation of bilateral lower extremity: Secondary | ICD-10-CM | POA: Diagnosis not present

## 2015-07-20 DIAGNOSIS — L97221 Non-pressure chronic ulcer of left calf limited to breakdown of skin: Secondary | ICD-10-CM | POA: Diagnosis not present

## 2015-08-03 DIAGNOSIS — L97211 Non-pressure chronic ulcer of right calf limited to breakdown of skin: Secondary | ICD-10-CM | POA: Diagnosis not present

## 2015-08-03 DIAGNOSIS — I87333 Chronic venous hypertension (idiopathic) with ulcer and inflammation of bilateral lower extremity: Secondary | ICD-10-CM | POA: Diagnosis not present

## 2015-08-03 DIAGNOSIS — I872 Venous insufficiency (chronic) (peripheral): Secondary | ICD-10-CM | POA: Diagnosis not present

## 2015-08-03 DIAGNOSIS — I10 Essential (primary) hypertension: Secondary | ICD-10-CM | POA: Diagnosis not present

## 2015-08-03 DIAGNOSIS — L97221 Non-pressure chronic ulcer of left calf limited to breakdown of skin: Secondary | ICD-10-CM | POA: Diagnosis not present

## 2015-08-03 DIAGNOSIS — E11628 Type 2 diabetes mellitus with other skin complications: Secondary | ICD-10-CM | POA: Diagnosis not present

## 2015-08-10 DIAGNOSIS — L97211 Non-pressure chronic ulcer of right calf limited to breakdown of skin: Secondary | ICD-10-CM | POA: Diagnosis not present

## 2015-08-10 DIAGNOSIS — L97221 Non-pressure chronic ulcer of left calf limited to breakdown of skin: Secondary | ICD-10-CM | POA: Diagnosis not present

## 2015-08-10 DIAGNOSIS — I10 Essential (primary) hypertension: Secondary | ICD-10-CM | POA: Diagnosis not present

## 2015-08-10 DIAGNOSIS — I872 Venous insufficiency (chronic) (peripheral): Secondary | ICD-10-CM | POA: Diagnosis not present

## 2015-08-10 DIAGNOSIS — I87333 Chronic venous hypertension (idiopathic) with ulcer and inflammation of bilateral lower extremity: Secondary | ICD-10-CM | POA: Diagnosis not present

## 2015-08-10 DIAGNOSIS — E11628 Type 2 diabetes mellitus with other skin complications: Secondary | ICD-10-CM | POA: Diagnosis not present

## 2015-08-17 ENCOUNTER — Encounter (HOSPITAL_BASED_OUTPATIENT_CLINIC_OR_DEPARTMENT_OTHER): Payer: Medicare Other | Attending: General Surgery

## 2015-08-17 DIAGNOSIS — E11622 Type 2 diabetes mellitus with other skin ulcer: Secondary | ICD-10-CM | POA: Insufficient documentation

## 2015-08-17 DIAGNOSIS — L97221 Non-pressure chronic ulcer of left calf limited to breakdown of skin: Secondary | ICD-10-CM | POA: Insufficient documentation

## 2015-08-17 DIAGNOSIS — M1990 Unspecified osteoarthritis, unspecified site: Secondary | ICD-10-CM | POA: Insufficient documentation

## 2015-08-17 DIAGNOSIS — H269 Unspecified cataract: Secondary | ICD-10-CM | POA: Insufficient documentation

## 2015-08-17 DIAGNOSIS — I87332 Chronic venous hypertension (idiopathic) with ulcer and inflammation of left lower extremity: Secondary | ICD-10-CM | POA: Insufficient documentation

## 2015-08-17 DIAGNOSIS — I10 Essential (primary) hypertension: Secondary | ICD-10-CM | POA: Insufficient documentation

## 2015-08-17 DIAGNOSIS — I872 Venous insufficiency (chronic) (peripheral): Secondary | ICD-10-CM | POA: Insufficient documentation

## 2015-08-17 DIAGNOSIS — E1140 Type 2 diabetes mellitus with diabetic neuropathy, unspecified: Secondary | ICD-10-CM | POA: Insufficient documentation

## 2015-08-24 DIAGNOSIS — I87332 Chronic venous hypertension (idiopathic) with ulcer and inflammation of left lower extremity: Secondary | ICD-10-CM | POA: Diagnosis not present

## 2015-08-24 DIAGNOSIS — E11622 Type 2 diabetes mellitus with other skin ulcer: Secondary | ICD-10-CM | POA: Diagnosis not present

## 2015-08-24 DIAGNOSIS — I872 Venous insufficiency (chronic) (peripheral): Secondary | ICD-10-CM | POA: Diagnosis not present

## 2015-08-24 DIAGNOSIS — I10 Essential (primary) hypertension: Secondary | ICD-10-CM | POA: Diagnosis not present

## 2015-08-24 DIAGNOSIS — E1140 Type 2 diabetes mellitus with diabetic neuropathy, unspecified: Secondary | ICD-10-CM | POA: Diagnosis not present

## 2015-08-24 DIAGNOSIS — M1990 Unspecified osteoarthritis, unspecified site: Secondary | ICD-10-CM | POA: Diagnosis not present

## 2015-08-24 DIAGNOSIS — L97221 Non-pressure chronic ulcer of left calf limited to breakdown of skin: Secondary | ICD-10-CM | POA: Diagnosis not present

## 2015-08-24 DIAGNOSIS — H269 Unspecified cataract: Secondary | ICD-10-CM | POA: Diagnosis not present

## 2015-08-31 DIAGNOSIS — L97221 Non-pressure chronic ulcer of left calf limited to breakdown of skin: Secondary | ICD-10-CM | POA: Diagnosis not present

## 2015-08-31 DIAGNOSIS — E11622 Type 2 diabetes mellitus with other skin ulcer: Secondary | ICD-10-CM | POA: Diagnosis not present

## 2015-08-31 DIAGNOSIS — I87333 Chronic venous hypertension (idiopathic) with ulcer and inflammation of bilateral lower extremity: Secondary | ICD-10-CM | POA: Diagnosis not present

## 2015-08-31 DIAGNOSIS — E1140 Type 2 diabetes mellitus with diabetic neuropathy, unspecified: Secondary | ICD-10-CM | POA: Diagnosis not present

## 2015-08-31 DIAGNOSIS — E11628 Type 2 diabetes mellitus with other skin complications: Secondary | ICD-10-CM | POA: Diagnosis not present

## 2015-08-31 DIAGNOSIS — I87332 Chronic venous hypertension (idiopathic) with ulcer and inflammation of left lower extremity: Secondary | ICD-10-CM | POA: Diagnosis not present

## 2015-08-31 DIAGNOSIS — H269 Unspecified cataract: Secondary | ICD-10-CM | POA: Diagnosis not present

## 2015-08-31 DIAGNOSIS — I872 Venous insufficiency (chronic) (peripheral): Secondary | ICD-10-CM | POA: Diagnosis not present

## 2015-09-14 ENCOUNTER — Encounter (HOSPITAL_BASED_OUTPATIENT_CLINIC_OR_DEPARTMENT_OTHER): Payer: Medicare Other | Attending: General Surgery

## 2015-09-14 DIAGNOSIS — M1990 Unspecified osteoarthritis, unspecified site: Secondary | ICD-10-CM | POA: Diagnosis not present

## 2015-09-14 DIAGNOSIS — Z872 Personal history of diseases of the skin and subcutaneous tissue: Secondary | ICD-10-CM | POA: Insufficient documentation

## 2015-09-14 DIAGNOSIS — I10 Essential (primary) hypertension: Secondary | ICD-10-CM | POA: Insufficient documentation

## 2015-09-14 DIAGNOSIS — H269 Unspecified cataract: Secondary | ICD-10-CM | POA: Diagnosis not present

## 2015-09-14 DIAGNOSIS — I83028 Varicose veins of left lower extremity with ulcer other part of lower leg: Secondary | ICD-10-CM | POA: Insufficient documentation

## 2015-09-14 DIAGNOSIS — E1151 Type 2 diabetes mellitus with diabetic peripheral angiopathy without gangrene: Secondary | ICD-10-CM | POA: Diagnosis not present

## 2015-09-14 DIAGNOSIS — L97821 Non-pressure chronic ulcer of other part of left lower leg limited to breakdown of skin: Secondary | ICD-10-CM | POA: Insufficient documentation

## 2015-09-14 DIAGNOSIS — Z923 Personal history of irradiation: Secondary | ICD-10-CM | POA: Diagnosis not present

## 2015-09-21 DIAGNOSIS — I83028 Varicose veins of left lower extremity with ulcer other part of lower leg: Secondary | ICD-10-CM | POA: Diagnosis not present

## 2015-09-21 DIAGNOSIS — H269 Unspecified cataract: Secondary | ICD-10-CM | POA: Diagnosis not present

## 2015-09-21 DIAGNOSIS — I10 Essential (primary) hypertension: Secondary | ICD-10-CM | POA: Diagnosis not present

## 2015-09-21 DIAGNOSIS — M1990 Unspecified osteoarthritis, unspecified site: Secondary | ICD-10-CM | POA: Diagnosis not present

## 2015-09-21 DIAGNOSIS — E1151 Type 2 diabetes mellitus with diabetic peripheral angiopathy without gangrene: Secondary | ICD-10-CM | POA: Diagnosis not present

## 2015-09-21 DIAGNOSIS — L97821 Non-pressure chronic ulcer of other part of left lower leg limited to breakdown of skin: Secondary | ICD-10-CM | POA: Diagnosis not present

## 2015-09-28 DIAGNOSIS — I10 Essential (primary) hypertension: Secondary | ICD-10-CM | POA: Diagnosis not present

## 2015-09-28 DIAGNOSIS — H269 Unspecified cataract: Secondary | ICD-10-CM | POA: Diagnosis not present

## 2015-09-28 DIAGNOSIS — E1151 Type 2 diabetes mellitus with diabetic peripheral angiopathy without gangrene: Secondary | ICD-10-CM | POA: Diagnosis not present

## 2015-09-28 DIAGNOSIS — M1990 Unspecified osteoarthritis, unspecified site: Secondary | ICD-10-CM | POA: Diagnosis not present

## 2015-09-28 DIAGNOSIS — I83028 Varicose veins of left lower extremity with ulcer other part of lower leg: Secondary | ICD-10-CM | POA: Diagnosis not present

## 2015-09-28 DIAGNOSIS — L97821 Non-pressure chronic ulcer of other part of left lower leg limited to breakdown of skin: Secondary | ICD-10-CM | POA: Diagnosis not present

## 2015-10-12 ENCOUNTER — Encounter (HOSPITAL_BASED_OUTPATIENT_CLINIC_OR_DEPARTMENT_OTHER): Payer: Medicare Other | Attending: General Surgery

## 2015-10-12 DIAGNOSIS — E1151 Type 2 diabetes mellitus with diabetic peripheral angiopathy without gangrene: Secondary | ICD-10-CM | POA: Insufficient documentation

## 2015-10-12 DIAGNOSIS — L97821 Non-pressure chronic ulcer of other part of left lower leg limited to breakdown of skin: Secondary | ICD-10-CM | POA: Diagnosis not present

## 2015-10-12 DIAGNOSIS — E11628 Type 2 diabetes mellitus with other skin complications: Secondary | ICD-10-CM | POA: Diagnosis not present

## 2015-10-12 DIAGNOSIS — I872 Venous insufficiency (chronic) (peripheral): Secondary | ICD-10-CM | POA: Insufficient documentation

## 2015-10-12 DIAGNOSIS — E1140 Type 2 diabetes mellitus with diabetic neuropathy, unspecified: Secondary | ICD-10-CM | POA: Insufficient documentation

## 2015-10-12 DIAGNOSIS — I10 Essential (primary) hypertension: Secondary | ICD-10-CM | POA: Insufficient documentation

## 2015-10-12 DIAGNOSIS — M1990 Unspecified osteoarthritis, unspecified site: Secondary | ICD-10-CM | POA: Insufficient documentation

## 2015-10-12 DIAGNOSIS — I87332 Chronic venous hypertension (idiopathic) with ulcer and inflammation of left lower extremity: Secondary | ICD-10-CM | POA: Diagnosis not present

## 2015-10-12 DIAGNOSIS — Z923 Personal history of irradiation: Secondary | ICD-10-CM | POA: Insufficient documentation

## 2015-10-19 DIAGNOSIS — I10 Essential (primary) hypertension: Secondary | ICD-10-CM | POA: Diagnosis not present

## 2015-10-19 DIAGNOSIS — E11628 Type 2 diabetes mellitus with other skin complications: Secondary | ICD-10-CM | POA: Diagnosis not present

## 2015-10-19 DIAGNOSIS — E1151 Type 2 diabetes mellitus with diabetic peripheral angiopathy without gangrene: Secondary | ICD-10-CM | POA: Diagnosis not present

## 2015-10-19 DIAGNOSIS — I87332 Chronic venous hypertension (idiopathic) with ulcer and inflammation of left lower extremity: Secondary | ICD-10-CM | POA: Diagnosis not present

## 2015-10-19 DIAGNOSIS — I872 Venous insufficiency (chronic) (peripheral): Secondary | ICD-10-CM | POA: Diagnosis not present

## 2015-10-19 DIAGNOSIS — L97821 Non-pressure chronic ulcer of other part of left lower leg limited to breakdown of skin: Secondary | ICD-10-CM | POA: Diagnosis not present

## 2015-10-26 DIAGNOSIS — I10 Essential (primary) hypertension: Secondary | ICD-10-CM | POA: Diagnosis not present

## 2015-10-26 DIAGNOSIS — E1151 Type 2 diabetes mellitus with diabetic peripheral angiopathy without gangrene: Secondary | ICD-10-CM | POA: Diagnosis not present

## 2015-10-26 DIAGNOSIS — L97821 Non-pressure chronic ulcer of other part of left lower leg limited to breakdown of skin: Secondary | ICD-10-CM | POA: Diagnosis not present

## 2015-10-26 DIAGNOSIS — I872 Venous insufficiency (chronic) (peripheral): Secondary | ICD-10-CM | POA: Diagnosis not present

## 2015-10-26 DIAGNOSIS — E11628 Type 2 diabetes mellitus with other skin complications: Secondary | ICD-10-CM | POA: Diagnosis not present

## 2015-10-26 DIAGNOSIS — I87332 Chronic venous hypertension (idiopathic) with ulcer and inflammation of left lower extremity: Secondary | ICD-10-CM | POA: Diagnosis not present

## 2015-11-02 DIAGNOSIS — L97821 Non-pressure chronic ulcer of other part of left lower leg limited to breakdown of skin: Secondary | ICD-10-CM | POA: Diagnosis not present

## 2015-11-02 DIAGNOSIS — I10 Essential (primary) hypertension: Secondary | ICD-10-CM | POA: Diagnosis not present

## 2015-11-02 DIAGNOSIS — I872 Venous insufficiency (chronic) (peripheral): Secondary | ICD-10-CM | POA: Diagnosis not present

## 2015-11-02 DIAGNOSIS — E1151 Type 2 diabetes mellitus with diabetic peripheral angiopathy without gangrene: Secondary | ICD-10-CM | POA: Diagnosis not present

## 2015-11-02 DIAGNOSIS — E11628 Type 2 diabetes mellitus with other skin complications: Secondary | ICD-10-CM | POA: Diagnosis not present

## 2015-11-02 DIAGNOSIS — I87332 Chronic venous hypertension (idiopathic) with ulcer and inflammation of left lower extremity: Secondary | ICD-10-CM | POA: Diagnosis not present

## 2015-11-09 DIAGNOSIS — I87332 Chronic venous hypertension (idiopathic) with ulcer and inflammation of left lower extremity: Secondary | ICD-10-CM | POA: Diagnosis not present

## 2015-11-09 DIAGNOSIS — I872 Venous insufficiency (chronic) (peripheral): Secondary | ICD-10-CM | POA: Diagnosis not present

## 2015-11-09 DIAGNOSIS — E11628 Type 2 diabetes mellitus with other skin complications: Secondary | ICD-10-CM | POA: Diagnosis not present

## 2015-11-09 DIAGNOSIS — L97821 Non-pressure chronic ulcer of other part of left lower leg limited to breakdown of skin: Secondary | ICD-10-CM | POA: Diagnosis not present

## 2015-11-09 DIAGNOSIS — I10 Essential (primary) hypertension: Secondary | ICD-10-CM | POA: Diagnosis not present

## 2015-11-09 DIAGNOSIS — E1151 Type 2 diabetes mellitus with diabetic peripheral angiopathy without gangrene: Secondary | ICD-10-CM | POA: Diagnosis not present

## 2015-11-16 ENCOUNTER — Encounter (HOSPITAL_BASED_OUTPATIENT_CLINIC_OR_DEPARTMENT_OTHER): Payer: Medicare Other | Attending: General Surgery

## 2015-11-16 DIAGNOSIS — M1990 Unspecified osteoarthritis, unspecified site: Secondary | ICD-10-CM | POA: Diagnosis not present

## 2015-11-16 DIAGNOSIS — Z923 Personal history of irradiation: Secondary | ICD-10-CM | POA: Insufficient documentation

## 2015-11-16 DIAGNOSIS — I10 Essential (primary) hypertension: Secondary | ICD-10-CM | POA: Insufficient documentation

## 2015-11-16 DIAGNOSIS — I87333 Chronic venous hypertension (idiopathic) with ulcer and inflammation of bilateral lower extremity: Secondary | ICD-10-CM | POA: Insufficient documentation

## 2015-11-16 DIAGNOSIS — E1140 Type 2 diabetes mellitus with diabetic neuropathy, unspecified: Secondary | ICD-10-CM | POA: Diagnosis not present

## 2015-11-16 DIAGNOSIS — E11628 Type 2 diabetes mellitus with other skin complications: Secondary | ICD-10-CM | POA: Insufficient documentation

## 2015-11-16 DIAGNOSIS — E1151 Type 2 diabetes mellitus with diabetic peripheral angiopathy without gangrene: Secondary | ICD-10-CM | POA: Diagnosis not present

## 2015-11-16 DIAGNOSIS — I872 Venous insufficiency (chronic) (peripheral): Secondary | ICD-10-CM | POA: Diagnosis not present

## 2015-11-16 DIAGNOSIS — L97821 Non-pressure chronic ulcer of other part of left lower leg limited to breakdown of skin: Secondary | ICD-10-CM | POA: Diagnosis not present

## 2015-11-23 DIAGNOSIS — I87333 Chronic venous hypertension (idiopathic) with ulcer and inflammation of bilateral lower extremity: Secondary | ICD-10-CM | POA: Diagnosis not present

## 2015-11-23 DIAGNOSIS — I10 Essential (primary) hypertension: Secondary | ICD-10-CM | POA: Diagnosis not present

## 2015-11-23 DIAGNOSIS — E1140 Type 2 diabetes mellitus with diabetic neuropathy, unspecified: Secondary | ICD-10-CM | POA: Diagnosis not present

## 2015-11-23 DIAGNOSIS — L97821 Non-pressure chronic ulcer of other part of left lower leg limited to breakdown of skin: Secondary | ICD-10-CM | POA: Diagnosis not present

## 2015-11-23 DIAGNOSIS — C61 Malignant neoplasm of prostate: Secondary | ICD-10-CM | POA: Diagnosis not present

## 2015-11-23 DIAGNOSIS — I872 Venous insufficiency (chronic) (peripheral): Secondary | ICD-10-CM | POA: Diagnosis not present

## 2015-11-23 DIAGNOSIS — E1151 Type 2 diabetes mellitus with diabetic peripheral angiopathy without gangrene: Secondary | ICD-10-CM | POA: Diagnosis not present

## 2015-11-30 DIAGNOSIS — I87333 Chronic venous hypertension (idiopathic) with ulcer and inflammation of bilateral lower extremity: Secondary | ICD-10-CM | POA: Diagnosis not present

## 2015-11-30 DIAGNOSIS — E1140 Type 2 diabetes mellitus with diabetic neuropathy, unspecified: Secondary | ICD-10-CM | POA: Diagnosis not present

## 2015-11-30 DIAGNOSIS — L97821 Non-pressure chronic ulcer of other part of left lower leg limited to breakdown of skin: Secondary | ICD-10-CM | POA: Diagnosis not present

## 2015-11-30 DIAGNOSIS — E1151 Type 2 diabetes mellitus with diabetic peripheral angiopathy without gangrene: Secondary | ICD-10-CM | POA: Diagnosis not present

## 2015-11-30 DIAGNOSIS — I10 Essential (primary) hypertension: Secondary | ICD-10-CM | POA: Diagnosis not present

## 2015-11-30 DIAGNOSIS — I872 Venous insufficiency (chronic) (peripheral): Secondary | ICD-10-CM | POA: Diagnosis not present

## 2015-12-07 DIAGNOSIS — I872 Venous insufficiency (chronic) (peripheral): Secondary | ICD-10-CM | POA: Diagnosis not present

## 2015-12-07 DIAGNOSIS — L97821 Non-pressure chronic ulcer of other part of left lower leg limited to breakdown of skin: Secondary | ICD-10-CM | POA: Diagnosis not present

## 2015-12-07 DIAGNOSIS — I87333 Chronic venous hypertension (idiopathic) with ulcer and inflammation of bilateral lower extremity: Secondary | ICD-10-CM | POA: Diagnosis not present

## 2015-12-07 DIAGNOSIS — E1140 Type 2 diabetes mellitus with diabetic neuropathy, unspecified: Secondary | ICD-10-CM | POA: Diagnosis not present

## 2015-12-07 DIAGNOSIS — E1151 Type 2 diabetes mellitus with diabetic peripheral angiopathy without gangrene: Secondary | ICD-10-CM | POA: Diagnosis not present

## 2015-12-07 DIAGNOSIS — I10 Essential (primary) hypertension: Secondary | ICD-10-CM | POA: Diagnosis not present

## 2015-12-14 ENCOUNTER — Encounter (HOSPITAL_BASED_OUTPATIENT_CLINIC_OR_DEPARTMENT_OTHER): Payer: Medicare Other | Attending: Internal Medicine

## 2015-12-14 DIAGNOSIS — E1151 Type 2 diabetes mellitus with diabetic peripheral angiopathy without gangrene: Secondary | ICD-10-CM | POA: Insufficient documentation

## 2015-12-14 DIAGNOSIS — E1140 Type 2 diabetes mellitus with diabetic neuropathy, unspecified: Secondary | ICD-10-CM | POA: Insufficient documentation

## 2015-12-14 DIAGNOSIS — L97821 Non-pressure chronic ulcer of other part of left lower leg limited to breakdown of skin: Secondary | ICD-10-CM | POA: Insufficient documentation

## 2015-12-14 DIAGNOSIS — Z923 Personal history of irradiation: Secondary | ICD-10-CM | POA: Insufficient documentation

## 2015-12-14 DIAGNOSIS — M1990 Unspecified osteoarthritis, unspecified site: Secondary | ICD-10-CM | POA: Insufficient documentation

## 2015-12-14 DIAGNOSIS — E11628 Type 2 diabetes mellitus with other skin complications: Secondary | ICD-10-CM | POA: Insufficient documentation

## 2015-12-14 DIAGNOSIS — I872 Venous insufficiency (chronic) (peripheral): Secondary | ICD-10-CM | POA: Insufficient documentation

## 2015-12-14 DIAGNOSIS — I10 Essential (primary) hypertension: Secondary | ICD-10-CM | POA: Insufficient documentation

## 2015-12-14 DIAGNOSIS — I87332 Chronic venous hypertension (idiopathic) with ulcer and inflammation of left lower extremity: Secondary | ICD-10-CM | POA: Insufficient documentation

## 2015-12-28 DIAGNOSIS — I10 Essential (primary) hypertension: Secondary | ICD-10-CM | POA: Diagnosis not present

## 2015-12-28 DIAGNOSIS — I872 Venous insufficiency (chronic) (peripheral): Secondary | ICD-10-CM | POA: Diagnosis not present

## 2015-12-28 DIAGNOSIS — L97821 Non-pressure chronic ulcer of other part of left lower leg limited to breakdown of skin: Secondary | ICD-10-CM | POA: Diagnosis not present

## 2015-12-28 DIAGNOSIS — Z923 Personal history of irradiation: Secondary | ICD-10-CM | POA: Diagnosis not present

## 2015-12-28 DIAGNOSIS — I87332 Chronic venous hypertension (idiopathic) with ulcer and inflammation of left lower extremity: Secondary | ICD-10-CM | POA: Diagnosis not present

## 2015-12-28 DIAGNOSIS — E11628 Type 2 diabetes mellitus with other skin complications: Secondary | ICD-10-CM | POA: Diagnosis not present

## 2015-12-28 DIAGNOSIS — I87312 Chronic venous hypertension (idiopathic) with ulcer of left lower extremity: Secondary | ICD-10-CM | POA: Diagnosis not present

## 2015-12-28 DIAGNOSIS — M1990 Unspecified osteoarthritis, unspecified site: Secondary | ICD-10-CM | POA: Diagnosis not present

## 2015-12-28 DIAGNOSIS — E1151 Type 2 diabetes mellitus with diabetic peripheral angiopathy without gangrene: Secondary | ICD-10-CM | POA: Diagnosis not present

## 2015-12-28 DIAGNOSIS — E1140 Type 2 diabetes mellitus with diabetic neuropathy, unspecified: Secondary | ICD-10-CM | POA: Diagnosis not present

## 2016-01-04 DIAGNOSIS — E1151 Type 2 diabetes mellitus with diabetic peripheral angiopathy without gangrene: Secondary | ICD-10-CM | POA: Diagnosis not present

## 2016-01-04 DIAGNOSIS — I872 Venous insufficiency (chronic) (peripheral): Secondary | ICD-10-CM | POA: Diagnosis not present

## 2016-01-04 DIAGNOSIS — E1140 Type 2 diabetes mellitus with diabetic neuropathy, unspecified: Secondary | ICD-10-CM | POA: Diagnosis not present

## 2016-01-04 DIAGNOSIS — L97821 Non-pressure chronic ulcer of other part of left lower leg limited to breakdown of skin: Secondary | ICD-10-CM | POA: Diagnosis not present

## 2016-01-04 DIAGNOSIS — I87332 Chronic venous hypertension (idiopathic) with ulcer and inflammation of left lower extremity: Secondary | ICD-10-CM | POA: Diagnosis not present

## 2016-01-04 DIAGNOSIS — E11628 Type 2 diabetes mellitus with other skin complications: Secondary | ICD-10-CM | POA: Diagnosis not present

## 2016-01-04 DIAGNOSIS — I87312 Chronic venous hypertension (idiopathic) with ulcer of left lower extremity: Secondary | ICD-10-CM | POA: Diagnosis not present

## 2016-01-11 DIAGNOSIS — L97821 Non-pressure chronic ulcer of other part of left lower leg limited to breakdown of skin: Secondary | ICD-10-CM | POA: Diagnosis not present

## 2016-01-11 DIAGNOSIS — I87332 Chronic venous hypertension (idiopathic) with ulcer and inflammation of left lower extremity: Secondary | ICD-10-CM | POA: Diagnosis not present

## 2016-01-11 DIAGNOSIS — E1151 Type 2 diabetes mellitus with diabetic peripheral angiopathy without gangrene: Secondary | ICD-10-CM | POA: Diagnosis not present

## 2016-01-11 DIAGNOSIS — I872 Venous insufficiency (chronic) (peripheral): Secondary | ICD-10-CM | POA: Diagnosis not present

## 2016-01-11 DIAGNOSIS — E11628 Type 2 diabetes mellitus with other skin complications: Secondary | ICD-10-CM | POA: Diagnosis not present

## 2016-01-11 DIAGNOSIS — I87312 Chronic venous hypertension (idiopathic) with ulcer of left lower extremity: Secondary | ICD-10-CM | POA: Diagnosis not present

## 2016-01-11 DIAGNOSIS — E1140 Type 2 diabetes mellitus with diabetic neuropathy, unspecified: Secondary | ICD-10-CM | POA: Diagnosis not present

## 2016-01-18 ENCOUNTER — Encounter (HOSPITAL_BASED_OUTPATIENT_CLINIC_OR_DEPARTMENT_OTHER): Payer: Medicare Other | Attending: Internal Medicine

## 2016-01-18 DIAGNOSIS — I87332 Chronic venous hypertension (idiopathic) with ulcer and inflammation of left lower extremity: Secondary | ICD-10-CM | POA: Insufficient documentation

## 2016-01-18 DIAGNOSIS — E1140 Type 2 diabetes mellitus with diabetic neuropathy, unspecified: Secondary | ICD-10-CM | POA: Insufficient documentation

## 2016-01-18 DIAGNOSIS — I10 Essential (primary) hypertension: Secondary | ICD-10-CM | POA: Insufficient documentation

## 2016-01-18 DIAGNOSIS — Z923 Personal history of irradiation: Secondary | ICD-10-CM | POA: Diagnosis not present

## 2016-01-18 DIAGNOSIS — I87333 Chronic venous hypertension (idiopathic) with ulcer and inflammation of bilateral lower extremity: Secondary | ICD-10-CM | POA: Diagnosis not present

## 2016-01-18 DIAGNOSIS — M1990 Unspecified osteoarthritis, unspecified site: Secondary | ICD-10-CM | POA: Diagnosis not present

## 2016-01-18 DIAGNOSIS — L97821 Non-pressure chronic ulcer of other part of left lower leg limited to breakdown of skin: Secondary | ICD-10-CM | POA: Insufficient documentation

## 2016-01-18 DIAGNOSIS — E11628 Type 2 diabetes mellitus with other skin complications: Secondary | ICD-10-CM | POA: Diagnosis not present

## 2016-01-18 DIAGNOSIS — I872 Venous insufficiency (chronic) (peripheral): Secondary | ICD-10-CM | POA: Diagnosis not present

## 2016-02-01 DIAGNOSIS — I10 Essential (primary) hypertension: Secondary | ICD-10-CM | POA: Diagnosis not present

## 2016-02-01 DIAGNOSIS — Z923 Personal history of irradiation: Secondary | ICD-10-CM | POA: Diagnosis not present

## 2016-02-01 DIAGNOSIS — M1990 Unspecified osteoarthritis, unspecified site: Secondary | ICD-10-CM | POA: Diagnosis not present

## 2016-02-01 DIAGNOSIS — E11628 Type 2 diabetes mellitus with other skin complications: Secondary | ICD-10-CM | POA: Diagnosis not present

## 2016-02-01 DIAGNOSIS — I872 Venous insufficiency (chronic) (peripheral): Secondary | ICD-10-CM | POA: Diagnosis not present

## 2016-02-01 DIAGNOSIS — I87332 Chronic venous hypertension (idiopathic) with ulcer and inflammation of left lower extremity: Secondary | ICD-10-CM | POA: Diagnosis not present

## 2016-02-01 DIAGNOSIS — L97821 Non-pressure chronic ulcer of other part of left lower leg limited to breakdown of skin: Secondary | ICD-10-CM | POA: Diagnosis not present

## 2016-02-01 DIAGNOSIS — E1140 Type 2 diabetes mellitus with diabetic neuropathy, unspecified: Secondary | ICD-10-CM | POA: Diagnosis not present

## 2016-02-08 DIAGNOSIS — Z923 Personal history of irradiation: Secondary | ICD-10-CM | POA: Diagnosis not present

## 2016-02-08 DIAGNOSIS — L97821 Non-pressure chronic ulcer of other part of left lower leg limited to breakdown of skin: Secondary | ICD-10-CM | POA: Diagnosis not present

## 2016-02-08 DIAGNOSIS — E11628 Type 2 diabetes mellitus with other skin complications: Secondary | ICD-10-CM | POA: Diagnosis not present

## 2016-02-08 DIAGNOSIS — I87332 Chronic venous hypertension (idiopathic) with ulcer and inflammation of left lower extremity: Secondary | ICD-10-CM | POA: Diagnosis not present

## 2016-02-08 DIAGNOSIS — E1140 Type 2 diabetes mellitus with diabetic neuropathy, unspecified: Secondary | ICD-10-CM | POA: Diagnosis not present

## 2016-02-08 DIAGNOSIS — I872 Venous insufficiency (chronic) (peripheral): Secondary | ICD-10-CM | POA: Diagnosis not present

## 2016-02-08 DIAGNOSIS — M1990 Unspecified osteoarthritis, unspecified site: Secondary | ICD-10-CM | POA: Diagnosis not present

## 2016-02-08 DIAGNOSIS — I10 Essential (primary) hypertension: Secondary | ICD-10-CM | POA: Diagnosis not present

## 2016-02-15 ENCOUNTER — Encounter (HOSPITAL_BASED_OUTPATIENT_CLINIC_OR_DEPARTMENT_OTHER): Payer: Medicare Other | Attending: Surgery

## 2016-02-15 DIAGNOSIS — Z923 Personal history of irradiation: Secondary | ICD-10-CM | POA: Diagnosis not present

## 2016-02-15 DIAGNOSIS — L97821 Non-pressure chronic ulcer of other part of left lower leg limited to breakdown of skin: Secondary | ICD-10-CM | POA: Insufficient documentation

## 2016-02-15 DIAGNOSIS — I87332 Chronic venous hypertension (idiopathic) with ulcer and inflammation of left lower extremity: Secondary | ICD-10-CM | POA: Diagnosis not present

## 2016-02-15 DIAGNOSIS — E1136 Type 2 diabetes mellitus with diabetic cataract: Secondary | ICD-10-CM | POA: Insufficient documentation

## 2016-02-15 DIAGNOSIS — I87312 Chronic venous hypertension (idiopathic) with ulcer of left lower extremity: Secondary | ICD-10-CM | POA: Diagnosis not present

## 2016-02-15 DIAGNOSIS — E1140 Type 2 diabetes mellitus with diabetic neuropathy, unspecified: Secondary | ICD-10-CM | POA: Insufficient documentation

## 2016-02-15 DIAGNOSIS — M1990 Unspecified osteoarthritis, unspecified site: Secondary | ICD-10-CM | POA: Diagnosis not present

## 2016-02-15 DIAGNOSIS — I10 Essential (primary) hypertension: Secondary | ICD-10-CM | POA: Diagnosis not present

## 2016-02-15 DIAGNOSIS — E11628 Type 2 diabetes mellitus with other skin complications: Secondary | ICD-10-CM | POA: Diagnosis not present

## 2016-02-22 DIAGNOSIS — I872 Venous insufficiency (chronic) (peripheral): Secondary | ICD-10-CM | POA: Diagnosis not present

## 2016-02-22 DIAGNOSIS — L97821 Non-pressure chronic ulcer of other part of left lower leg limited to breakdown of skin: Secondary | ICD-10-CM | POA: Diagnosis not present

## 2016-02-22 DIAGNOSIS — I87332 Chronic venous hypertension (idiopathic) with ulcer and inflammation of left lower extremity: Secondary | ICD-10-CM | POA: Diagnosis not present

## 2016-02-22 DIAGNOSIS — E1140 Type 2 diabetes mellitus with diabetic neuropathy, unspecified: Secondary | ICD-10-CM | POA: Diagnosis not present

## 2016-02-22 DIAGNOSIS — M1990 Unspecified osteoarthritis, unspecified site: Secondary | ICD-10-CM | POA: Diagnosis not present

## 2016-02-22 DIAGNOSIS — I87333 Chronic venous hypertension (idiopathic) with ulcer and inflammation of bilateral lower extremity: Secondary | ICD-10-CM | POA: Diagnosis not present

## 2016-02-22 DIAGNOSIS — E11628 Type 2 diabetes mellitus with other skin complications: Secondary | ICD-10-CM | POA: Diagnosis not present

## 2016-02-22 DIAGNOSIS — E1136 Type 2 diabetes mellitus with diabetic cataract: Secondary | ICD-10-CM | POA: Diagnosis not present

## 2016-02-22 DIAGNOSIS — Z923 Personal history of irradiation: Secondary | ICD-10-CM | POA: Diagnosis not present

## 2016-02-29 DIAGNOSIS — Z923 Personal history of irradiation: Secondary | ICD-10-CM | POA: Diagnosis not present

## 2016-02-29 DIAGNOSIS — L97821 Non-pressure chronic ulcer of other part of left lower leg limited to breakdown of skin: Secondary | ICD-10-CM | POA: Diagnosis not present

## 2016-02-29 DIAGNOSIS — I87333 Chronic venous hypertension (idiopathic) with ulcer and inflammation of bilateral lower extremity: Secondary | ICD-10-CM | POA: Diagnosis not present

## 2016-02-29 DIAGNOSIS — M1990 Unspecified osteoarthritis, unspecified site: Secondary | ICD-10-CM | POA: Diagnosis not present

## 2016-02-29 DIAGNOSIS — I872 Venous insufficiency (chronic) (peripheral): Secondary | ICD-10-CM | POA: Diagnosis not present

## 2016-02-29 DIAGNOSIS — E11628 Type 2 diabetes mellitus with other skin complications: Secondary | ICD-10-CM | POA: Diagnosis not present

## 2016-02-29 DIAGNOSIS — I87332 Chronic venous hypertension (idiopathic) with ulcer and inflammation of left lower extremity: Secondary | ICD-10-CM | POA: Diagnosis not present

## 2016-02-29 DIAGNOSIS — E1140 Type 2 diabetes mellitus with diabetic neuropathy, unspecified: Secondary | ICD-10-CM | POA: Diagnosis not present

## 2016-02-29 DIAGNOSIS — E1136 Type 2 diabetes mellitus with diabetic cataract: Secondary | ICD-10-CM | POA: Diagnosis not present

## 2016-05-30 DIAGNOSIS — C61 Malignant neoplasm of prostate: Secondary | ICD-10-CM | POA: Diagnosis not present

## 2016-06-06 DIAGNOSIS — E1121 Type 2 diabetes mellitus with diabetic nephropathy: Secondary | ICD-10-CM | POA: Diagnosis not present

## 2016-06-06 DIAGNOSIS — Z794 Long term (current) use of insulin: Secondary | ICD-10-CM | POA: Diagnosis not present

## 2016-06-06 DIAGNOSIS — C61 Malignant neoplasm of prostate: Secondary | ICD-10-CM | POA: Diagnosis not present

## 2016-06-06 DIAGNOSIS — I10 Essential (primary) hypertension: Secondary | ICD-10-CM | POA: Diagnosis not present

## 2016-06-06 DIAGNOSIS — E782 Mixed hyperlipidemia: Secondary | ICD-10-CM | POA: Diagnosis not present

## 2017-04-24 DIAGNOSIS — R011 Cardiac murmur, unspecified: Secondary | ICD-10-CM | POA: Diagnosis not present

## 2017-04-24 DIAGNOSIS — I10 Essential (primary) hypertension: Secondary | ICD-10-CM | POA: Diagnosis not present

## 2017-04-24 DIAGNOSIS — E782 Mixed hyperlipidemia: Secondary | ICD-10-CM | POA: Diagnosis not present

## 2017-04-24 DIAGNOSIS — M5136 Other intervertebral disc degeneration, lumbar region: Secondary | ICD-10-CM | POA: Diagnosis not present

## 2017-04-24 DIAGNOSIS — E1140 Type 2 diabetes mellitus with diabetic neuropathy, unspecified: Secondary | ICD-10-CM | POA: Diagnosis not present

## 2017-04-24 DIAGNOSIS — F329 Major depressive disorder, single episode, unspecified: Secondary | ICD-10-CM | POA: Diagnosis not present

## 2017-04-24 DIAGNOSIS — Z7984 Long term (current) use of oral hypoglycemic drugs: Secondary | ICD-10-CM | POA: Diagnosis not present

## 2017-04-25 ENCOUNTER — Telehealth: Payer: Self-pay

## 2017-04-25 NOTE — Telephone Encounter (Signed)
SENT NOTES TO SCHEDULING 

## 2017-04-27 ENCOUNTER — Other Ambulatory Visit: Payer: Self-pay | Admitting: Family Medicine

## 2017-04-27 DIAGNOSIS — R011 Cardiac murmur, unspecified: Secondary | ICD-10-CM

## 2017-05-10 ENCOUNTER — Other Ambulatory Visit: Payer: Self-pay

## 2017-05-10 ENCOUNTER — Ambulatory Visit (HOSPITAL_COMMUNITY): Payer: Medicare Other | Attending: Internal Medicine

## 2017-05-10 DIAGNOSIS — Z8249 Family history of ischemic heart disease and other diseases of the circulatory system: Secondary | ICD-10-CM | POA: Insufficient documentation

## 2017-05-10 DIAGNOSIS — I119 Hypertensive heart disease without heart failure: Secondary | ICD-10-CM | POA: Diagnosis not present

## 2017-05-10 DIAGNOSIS — I351 Nonrheumatic aortic (valve) insufficiency: Secondary | ICD-10-CM | POA: Insufficient documentation

## 2017-05-10 DIAGNOSIS — E119 Type 2 diabetes mellitus without complications: Secondary | ICD-10-CM | POA: Insufficient documentation

## 2017-05-10 DIAGNOSIS — R011 Cardiac murmur, unspecified: Secondary | ICD-10-CM

## 2017-05-10 DIAGNOSIS — I348 Other nonrheumatic mitral valve disorders: Secondary | ICD-10-CM | POA: Insufficient documentation

## 2017-05-10 DIAGNOSIS — E785 Hyperlipidemia, unspecified: Secondary | ICD-10-CM | POA: Insufficient documentation

## 2017-05-11 DIAGNOSIS — Z1211 Encounter for screening for malignant neoplasm of colon: Secondary | ICD-10-CM | POA: Diagnosis not present

## 2017-05-11 DIAGNOSIS — R600 Localized edema: Secondary | ICD-10-CM | POA: Diagnosis not present

## 2017-05-11 DIAGNOSIS — D649 Anemia, unspecified: Secondary | ICD-10-CM | POA: Diagnosis not present

## 2017-05-11 DIAGNOSIS — S81809D Unspecified open wound, unspecified lower leg, subsequent encounter: Secondary | ICD-10-CM | POA: Diagnosis not present

## 2017-06-14 DIAGNOSIS — R0602 Shortness of breath: Secondary | ICD-10-CM | POA: Diagnosis not present

## 2017-06-14 DIAGNOSIS — K529 Noninfective gastroenteritis and colitis, unspecified: Secondary | ICD-10-CM | POA: Diagnosis not present

## 2017-06-18 DIAGNOSIS — R0602 Shortness of breath: Secondary | ICD-10-CM | POA: Diagnosis not present

## 2017-06-18 DIAGNOSIS — N289 Disorder of kidney and ureter, unspecified: Secondary | ICD-10-CM | POA: Diagnosis not present

## 2017-06-25 DIAGNOSIS — N289 Disorder of kidney and ureter, unspecified: Secondary | ICD-10-CM | POA: Diagnosis not present

## 2017-06-25 DIAGNOSIS — I519 Heart disease, unspecified: Secondary | ICD-10-CM | POA: Diagnosis not present

## 2017-06-25 DIAGNOSIS — I700 Atherosclerosis of aorta: Secondary | ICD-10-CM | POA: Diagnosis not present

## 2017-06-25 DIAGNOSIS — Z8546 Personal history of malignant neoplasm of prostate: Secondary | ICD-10-CM | POA: Diagnosis not present

## 2017-06-25 DIAGNOSIS — R0609 Other forms of dyspnea: Secondary | ICD-10-CM | POA: Diagnosis not present

## 2017-06-25 DIAGNOSIS — R05 Cough: Secondary | ICD-10-CM | POA: Diagnosis not present

## 2017-06-26 ENCOUNTER — Other Ambulatory Visit (HOSPITAL_COMMUNITY): Payer: Self-pay | Admitting: Family Medicine

## 2017-06-26 ENCOUNTER — Ambulatory Visit (HOSPITAL_COMMUNITY)
Admission: RE | Admit: 2017-06-26 | Discharge: 2017-06-26 | Disposition: A | Discharge status: Home / Self Care | Payer: Medicare Other | Source: Ambulatory Visit | Attending: Family Medicine | Admitting: Family Medicine

## 2017-06-26 DIAGNOSIS — R0602 Shortness of breath: Secondary | ICD-10-CM | POA: Diagnosis not present

## 2017-06-26 DIAGNOSIS — N289 Disorder of kidney and ureter, unspecified: Secondary | ICD-10-CM

## 2017-06-26 MED ORDER — TECHNETIUM TO 99M ALBUMIN AGGREGATED
3.0000 | Freq: Once | INTRAVENOUS | Status: AC | PRN
  Administered 2017-06-26: 3 via INTRAVENOUS

## 2017-06-26 MED ORDER — TECHNETIUM TC 99M DIETHYLENETRIAME-PENTAACETIC ACID: 30.0000 | Freq: Once | INTRAVENOUS | Status: DC | PRN

## 2017-07-03 ENCOUNTER — Other Ambulatory Visit: Payer: Medicare Other

## 2017-07-19 ENCOUNTER — Other Ambulatory Visit: Payer: Medicare Other

## 2017-07-24 ENCOUNTER — Ambulatory Visit
Admission: RE | Admit: 2017-07-24 | Discharge: 2017-07-24 | Disposition: A | Discharge status: Home / Self Care | Payer: Medicare Other | Source: Ambulatory Visit | Attending: Family Medicine | Admitting: Family Medicine

## 2017-07-24 ENCOUNTER — Other Ambulatory Visit (HOSPITAL_COMMUNITY): Payer: Self-pay | Admitting: Family Medicine

## 2017-07-24 DIAGNOSIS — N289 Disorder of kidney and ureter, unspecified: Secondary | ICD-10-CM

## 2017-07-24 DIAGNOSIS — N2889 Other specified disorders of kidney and ureter: Secondary | ICD-10-CM | POA: Diagnosis not present

## 2017-08-07 DIAGNOSIS — Z23 Encounter for immunization: Secondary | ICD-10-CM | POA: Diagnosis not present

## 2017-08-07 DIAGNOSIS — R0609 Other forms of dyspnea: Secondary | ICD-10-CM | POA: Diagnosis not present

## 2017-08-07 DIAGNOSIS — E1121 Type 2 diabetes mellitus with diabetic nephropathy: Secondary | ICD-10-CM | POA: Diagnosis not present

## 2017-08-07 DIAGNOSIS — N183 Chronic kidney disease, stage 3 (moderate): Secondary | ICD-10-CM | POA: Diagnosis not present

## 2017-08-07 DIAGNOSIS — I519 Heart disease, unspecified: Secondary | ICD-10-CM | POA: Diagnosis not present

## 2017-12-06 ENCOUNTER — Other Ambulatory Visit: Payer: Self-pay

## 2017-12-06 ENCOUNTER — Inpatient Hospital Stay (HOSPITAL_COMMUNITY): Payer: Medicare Other

## 2017-12-06 ENCOUNTER — Inpatient Hospital Stay (HOSPITAL_COMMUNITY)
Admission: EM | Admit: 2017-12-06 | Discharge: 2017-12-12 | DRG: 291 | Disposition: A | Discharge status: Skilled Nursing Facility | Payer: Medicare Other | Attending: Internal Medicine | Admitting: Internal Medicine

## 2017-12-06 ENCOUNTER — Encounter (HOSPITAL_COMMUNITY): Payer: Self-pay

## 2017-12-06 ENCOUNTER — Emergency Department (HOSPITAL_COMMUNITY): Payer: Medicare Other

## 2017-12-06 DIAGNOSIS — D649 Anemia, unspecified: Secondary | ICD-10-CM | POA: Diagnosis not present

## 2017-12-06 DIAGNOSIS — Z79899 Other long term (current) drug therapy: Secondary | ICD-10-CM

## 2017-12-06 DIAGNOSIS — M160 Bilateral primary osteoarthritis of hip: Secondary | ICD-10-CM | POA: Diagnosis not present

## 2017-12-06 DIAGNOSIS — I872 Venous insufficiency (chronic) (peripheral): Secondary | ICD-10-CM | POA: Diagnosis present

## 2017-12-06 DIAGNOSIS — Z8546 Personal history of malignant neoplasm of prostate: Secondary | ICD-10-CM

## 2017-12-06 DIAGNOSIS — L89600 Pressure ulcer of unspecified heel, unstageable: Secondary | ICD-10-CM | POA: Diagnosis not present

## 2017-12-06 DIAGNOSIS — D631 Anemia in chronic kidney disease: Secondary | ICD-10-CM | POA: Diagnosis present

## 2017-12-06 DIAGNOSIS — L89609 Pressure ulcer of unspecified heel, unspecified stage: Secondary | ICD-10-CM | POA: Diagnosis present

## 2017-12-06 DIAGNOSIS — J189 Pneumonia, unspecified organism: Secondary | ICD-10-CM | POA: Diagnosis present

## 2017-12-06 DIAGNOSIS — T502X5A Adverse effect of carbonic-anhydrase inhibitors, benzothiadiazides and other diuretics, initial encounter: Secondary | ICD-10-CM | POA: Diagnosis present

## 2017-12-06 DIAGNOSIS — Z923 Personal history of irradiation: Secondary | ICD-10-CM | POA: Diagnosis not present

## 2017-12-06 DIAGNOSIS — I83009 Varicose veins of unspecified lower extremity with ulcer of unspecified site: Secondary | ICD-10-CM | POA: Diagnosis present

## 2017-12-06 DIAGNOSIS — F418 Other specified anxiety disorders: Secondary | ICD-10-CM | POA: Diagnosis present

## 2017-12-06 DIAGNOSIS — E1122 Type 2 diabetes mellitus with diabetic chronic kidney disease: Secondary | ICD-10-CM | POA: Diagnosis present

## 2017-12-06 DIAGNOSIS — E11621 Type 2 diabetes mellitus with foot ulcer: Secondary | ICD-10-CM | POA: Diagnosis present

## 2017-12-06 DIAGNOSIS — E0801 Diabetes mellitus due to underlying condition with hyperosmolarity with coma: Secondary | ICD-10-CM

## 2017-12-06 DIAGNOSIS — E669 Obesity, unspecified: Secondary | ICD-10-CM | POA: Diagnosis present

## 2017-12-06 DIAGNOSIS — E1142 Type 2 diabetes mellitus with diabetic polyneuropathy: Secondary | ICD-10-CM | POA: Diagnosis present

## 2017-12-06 DIAGNOSIS — R531 Weakness: Secondary | ICD-10-CM | POA: Diagnosis not present

## 2017-12-06 DIAGNOSIS — N179 Acute kidney failure, unspecified: Secondary | ICD-10-CM

## 2017-12-06 DIAGNOSIS — L97429 Non-pressure chronic ulcer of left heel and midfoot with unspecified severity: Secondary | ICD-10-CM | POA: Diagnosis present

## 2017-12-06 DIAGNOSIS — I440 Atrioventricular block, first degree: Secondary | ICD-10-CM | POA: Diagnosis present

## 2017-12-06 DIAGNOSIS — I130 Hypertensive heart and chronic kidney disease with heart failure and stage 1 through stage 4 chronic kidney disease, or unspecified chronic kidney disease: Secondary | ICD-10-CM | POA: Diagnosis present

## 2017-12-06 DIAGNOSIS — Z6833 Body mass index (BMI) 33.0-33.9, adult: Secondary | ICD-10-CM | POA: Diagnosis not present

## 2017-12-06 DIAGNOSIS — I5033 Acute on chronic diastolic (congestive) heart failure: Secondary | ICD-10-CM | POA: Diagnosis not present

## 2017-12-06 DIAGNOSIS — R030 Elevated blood-pressure reading, without diagnosis of hypertension: Secondary | ICD-10-CM | POA: Diagnosis not present

## 2017-12-06 DIAGNOSIS — R0600 Dyspnea, unspecified: Secondary | ICD-10-CM | POA: Diagnosis present

## 2017-12-06 DIAGNOSIS — F419 Anxiety disorder, unspecified: Secondary | ICD-10-CM | POA: Diagnosis present

## 2017-12-06 DIAGNOSIS — R061 Stridor: Secondary | ICD-10-CM | POA: Diagnosis not present

## 2017-12-06 DIAGNOSIS — E119 Type 2 diabetes mellitus without complications: Secondary | ICD-10-CM | POA: Insufficient documentation

## 2017-12-06 DIAGNOSIS — R296 Repeated falls: Secondary | ICD-10-CM | POA: Diagnosis present

## 2017-12-06 DIAGNOSIS — N183 Chronic kidney disease, stage 3 (moderate): Secondary | ICD-10-CM | POA: Diagnosis present

## 2017-12-06 DIAGNOSIS — R197 Diarrhea, unspecified: Secondary | ICD-10-CM | POA: Diagnosis present

## 2017-12-06 DIAGNOSIS — I5032 Chronic diastolic (congestive) heart failure: Secondary | ICD-10-CM

## 2017-12-06 DIAGNOSIS — L97909 Non-pressure chronic ulcer of unspecified part of unspecified lower leg with unspecified severity: Secondary | ICD-10-CM

## 2017-12-06 DIAGNOSIS — Z7982 Long term (current) use of aspirin: Secondary | ICD-10-CM

## 2017-12-06 DIAGNOSIS — E785 Hyperlipidemia, unspecified: Secondary | ICD-10-CM | POA: Diagnosis present

## 2017-12-06 DIAGNOSIS — Y92230 Patient room in hospital as the place of occurrence of the external cause: Secondary | ICD-10-CM | POA: Diagnosis present

## 2017-12-06 DIAGNOSIS — R0602 Shortness of breath: Secondary | ICD-10-CM | POA: Diagnosis not present

## 2017-12-06 DIAGNOSIS — Z794 Long term (current) use of insulin: Secondary | ICD-10-CM

## 2017-12-06 DIAGNOSIS — C61 Malignant neoplasm of prostate: Secondary | ICD-10-CM | POA: Diagnosis not present

## 2017-12-06 DIAGNOSIS — F329 Major depressive disorder, single episode, unspecified: Secondary | ICD-10-CM | POA: Diagnosis present

## 2017-12-06 DIAGNOSIS — N189 Chronic kidney disease, unspecified: Secondary | ICD-10-CM | POA: Diagnosis not present

## 2017-12-06 DIAGNOSIS — R3129 Other microscopic hematuria: Secondary | ICD-10-CM | POA: Diagnosis present

## 2017-12-06 DIAGNOSIS — F341 Dysthymic disorder: Secondary | ICD-10-CM | POA: Diagnosis present

## 2017-12-06 DIAGNOSIS — I10 Essential (primary) hypertension: Secondary | ICD-10-CM | POA: Diagnosis present

## 2017-12-06 DIAGNOSIS — R159 Full incontinence of feces: Secondary | ICD-10-CM | POA: Diagnosis present

## 2017-12-06 DIAGNOSIS — I5040 Unspecified combined systolic (congestive) and diastolic (congestive) heart failure: Secondary | ICD-10-CM | POA: Diagnosis not present

## 2017-12-06 DIAGNOSIS — Z96643 Presence of artificial hip joint, bilateral: Secondary | ICD-10-CM | POA: Diagnosis present

## 2017-12-06 DIAGNOSIS — Z8249 Family history of ischemic heart disease and other diseases of the circulatory system: Secondary | ICD-10-CM | POA: Diagnosis not present

## 2017-12-06 DIAGNOSIS — J90 Pleural effusion, not elsewhere classified: Secondary | ICD-10-CM | POA: Diagnosis present

## 2017-12-06 DIAGNOSIS — I509 Heart failure, unspecified: Secondary | ICD-10-CM

## 2017-12-06 DIAGNOSIS — J181 Lobar pneumonia, unspecified organism: Secondary | ICD-10-CM | POA: Diagnosis not present

## 2017-12-06 DIAGNOSIS — I351 Nonrheumatic aortic (valve) insufficiency: Secondary | ICD-10-CM | POA: Diagnosis not present

## 2017-12-06 DIAGNOSIS — I878 Other specified disorders of veins: Secondary | ICD-10-CM | POA: Diagnosis present

## 2017-12-06 DIAGNOSIS — Z79891 Long term (current) use of opiate analgesic: Secondary | ICD-10-CM

## 2017-12-06 HISTORY — DX: Pleural effusion, not elsewhere classified: J90

## 2017-12-06 HISTORY — DX: Heart failure, unspecified: I50.9

## 2017-12-06 HISTORY — DX: Type 2 diabetes mellitus without complications: E11.9

## 2017-12-06 HISTORY — DX: Essential (primary) hypertension: I10

## 2017-12-06 HISTORY — DX: Venous insufficiency (chronic) (peripheral): I87.2

## 2017-12-06 HISTORY — DX: Hyperlipidemia, unspecified: E78.5

## 2017-12-06 HISTORY — DX: Non-pressure chronic ulcer of unspecified part of right lower leg with unspecified severity: L97.919

## 2017-12-06 HISTORY — DX: Pneumonia, unspecified organism: J18.9

## 2017-12-06 HISTORY — DX: Non-pressure chronic ulcer of unspecified part of right lower leg with unspecified severity: L97.929

## 2017-12-06 HISTORY — DX: Malignant neoplasm of prostate: C61

## 2017-12-06 LAB — POC OCCULT BLOOD, ED: Fecal Occult Bld: NEGATIVE

## 2017-12-06 LAB — CBC WITH DIFFERENTIAL/PLATELET
Basophils Absolute: 0 10*3/uL (ref 0.0–0.1)
Basophils Relative: 1 %
EOS PCT: 5 %
Eosinophils Absolute: 0.3 10*3/uL (ref 0.0–0.7)
HCT: 29.1 % — ABNORMAL LOW (ref 39.0–52.0)
Hemoglobin: 9.3 g/dL — ABNORMAL LOW (ref 13.0–17.0)
LYMPHS ABS: 0.9 10*3/uL (ref 0.7–4.0)
LYMPHS PCT: 14 %
MCH: 29.2 pg (ref 26.0–34.0)
MCHC: 32 g/dL (ref 30.0–36.0)
MCV: 91.5 fL (ref 78.0–100.0)
Monocytes Absolute: 0.5 10*3/uL (ref 0.1–1.0)
Monocytes Relative: 7 %
Neutro Abs: 4.8 10*3/uL (ref 1.7–7.7)
Neutrophils Relative %: 73 %
PLATELETS: 274 10*3/uL (ref 150–400)
RBC: 3.18 MIL/uL — AB (ref 4.22–5.81)
RDW: 13.8 % (ref 11.5–15.5)
WBC: 6.6 10*3/uL (ref 4.0–10.5)

## 2017-12-06 LAB — URINALYSIS, ROUTINE W REFLEX MICROSCOPIC
BILIRUBIN URINE: NEGATIVE
Bacteria, UA: NONE SEEN
Glucose, UA: 50 mg/dL — AB
KETONES UR: NEGATIVE mg/dL
LEUKOCYTES UA: NEGATIVE
NITRITE: NEGATIVE
PROTEIN: 100 mg/dL — AB
Specific Gravity, Urine: 1.013 (ref 1.005–1.030)
pH: 5 (ref 5.0–8.0)

## 2017-12-06 LAB — C DIFFICILE QUICK SCREEN W PCR REFLEX
C DIFFICILE (CDIFF) TOXIN: NEGATIVE
C Diff antigen: NEGATIVE
C Diff interpretation: NOT DETECTED

## 2017-12-06 LAB — I-STAT TROPONIN, ED: Troponin i, poc: 0.04 ng/mL (ref 0.00–0.08)

## 2017-12-06 LAB — STREP PNEUMONIAE URINARY ANTIGEN: STREP PNEUMO URINARY ANTIGEN: NEGATIVE

## 2017-12-06 LAB — BRAIN NATRIURETIC PEPTIDE: B Natriuretic Peptide: 1657.7 pg/mL — ABNORMAL HIGH (ref 0.0–100.0)

## 2017-12-06 LAB — BASIC METABOLIC PANEL
Anion gap: 9 (ref 5–15)
BUN: 20 mg/dL (ref 6–20)
CO2: 25 mmol/L (ref 22–32)
CREATININE: 1.39 mg/dL — AB (ref 0.61–1.24)
Calcium: 8.4 mg/dL — ABNORMAL LOW (ref 8.9–10.3)
Chloride: 106 mmol/L (ref 101–111)
GFR calc Af Amer: 56 mL/min — ABNORMAL LOW (ref >60)
GFR, EST NON AFRICAN AMERICAN: 48 mL/min — AB (ref >60)
Glucose, Bld: 189 mg/dL — ABNORMAL HIGH (ref 65–99)
Potassium: 3.9 mmol/L (ref 3.5–5.1)
SODIUM: 140 mmol/L (ref 135–145)

## 2017-12-06 LAB — TROPONIN I

## 2017-12-06 LAB — CBG MONITORING, ED: GLUCOSE-CAPILLARY: 150 mg/dL — AB (ref 65–99)

## 2017-12-06 LAB — GLUCOSE, CAPILLARY
GLUCOSE-CAPILLARY: 156 mg/dL — AB (ref 65–99)
Glucose-Capillary: 188 mg/dL — ABNORMAL HIGH (ref 65–99)

## 2017-12-06 LAB — I-STAT CG4 LACTIC ACID, ED: LACTIC ACID, VENOUS: 0.85 mmol/L (ref 0.5–1.9)

## 2017-12-06 LAB — INFLUENZA PANEL BY PCR (TYPE A & B)
Influenza A By PCR: NEGATIVE
Influenza B By PCR: NEGATIVE

## 2017-12-06 LAB — HEMOGLOBIN A1C
Hgb A1c MFr Bld: 6.8 % — ABNORMAL HIGH (ref 4.8–5.6)
MEAN PLASMA GLUCOSE: 148.46 mg/dL

## 2017-12-06 LAB — MAGNESIUM: Magnesium: 1.9 mg/dL (ref 1.7–2.4)

## 2017-12-06 MED ORDER — GABAPENTIN 400 MG PO CAPS
400.0000 mg | ORAL_CAPSULE | Freq: Three times a day (TID) | ORAL | Status: DC
  Administered 2017-12-06 – 2017-12-12 (×18): 400 mg via ORAL
  Filled 2017-12-06 (×18): qty 1

## 2017-12-06 MED ORDER — HYDROXYZINE HCL 25 MG PO TABS: 25.0000 mg | ORAL_TABLET | Freq: Four times a day (QID) | ORAL | Status: DC | PRN

## 2017-12-06 MED ORDER — ESCITALOPRAM OXALATE 10 MG PO TABS
10.0000 mg | ORAL_TABLET | Freq: Every day | ORAL | Status: DC
  Administered 2017-12-07 – 2017-12-12 (×6): 10 mg via ORAL
  Filled 2017-12-06 (×6): qty 1

## 2017-12-06 MED ORDER — INSULIN ASPART 100 UNIT/ML ~~LOC~~ SOLN: 0.0000 [IU] | Freq: Every day | SUBCUTANEOUS | Status: DC

## 2017-12-06 MED ORDER — ASPIRIN 81 MG PO CHEW
81.0000 mg | CHEWABLE_TABLET | Freq: Once | ORAL | Status: AC
  Administered 2017-12-06: 81 mg via ORAL
  Filled 2017-12-06: qty 1

## 2017-12-06 MED ORDER — ONDANSETRON HCL 4 MG/2ML IJ SOLN
4.0000 mg | Freq: Four times a day (QID) | INTRAMUSCULAR | Status: DC | PRN
Start: 2017-12-06 — End: 2017-12-12

## 2017-12-06 MED ORDER — ASPIRIN 325 MG PO TABS
325.0000 mg | ORAL_TABLET | Freq: Every day | ORAL | Status: DC
  Administered 2017-12-07 – 2017-12-12 (×6): 325 mg via ORAL
  Filled 2017-12-06 (×6): qty 1

## 2017-12-06 MED ORDER — IPRATROPIUM-ALBUTEROL 0.5-2.5 (3) MG/3ML IN SOLN
3.0000 mL | Freq: Once | RESPIRATORY_TRACT | Status: AC
  Administered 2017-12-06: 3 mL via RESPIRATORY_TRACT
  Filled 2017-12-06: qty 3

## 2017-12-06 MED ORDER — DEXTROSE 5 % IV SOLN
500.0000 mg | INTRAVENOUS | Status: DC
  Administered 2017-12-07 – 2017-12-09 (×3): 500 mg via INTRAVENOUS
  Filled 2017-12-06 (×3): qty 500

## 2017-12-06 MED ORDER — ENOXAPARIN SODIUM 40 MG/0.4ML ~~LOC~~ SOLN
40.0000 mg | SUBCUTANEOUS | Status: DC
  Administered 2017-12-06 – 2017-12-11 (×6): 40 mg via SUBCUTANEOUS
  Filled 2017-12-06 (×6): qty 0.4

## 2017-12-06 MED ORDER — ACETAMINOPHEN 325 MG PO TABS
650.0000 mg | ORAL_TABLET | ORAL | Status: DC | PRN
  Administered 2017-12-09: 650 mg via ORAL
  Filled 2017-12-06: qty 2

## 2017-12-06 MED ORDER — CALCIUM CARBONATE-VITAMIN D 500-200 MG-UNIT PO TABS
1.0000 | ORAL_TABLET | Freq: Every day | ORAL | Status: DC
  Administered 2017-12-07 – 2017-12-12 (×6): 1 via ORAL
  Filled 2017-12-06 (×7): qty 1

## 2017-12-06 MED ORDER — BUPROPION HCL ER (XL) 150 MG PO TB24
300.0000 mg | ORAL_TABLET | Freq: Every day | ORAL | Status: DC
  Administered 2017-12-07 – 2017-12-12 (×6): 300 mg via ORAL
  Filled 2017-12-06 (×6): qty 2

## 2017-12-06 MED ORDER — FUROSEMIDE 10 MG/ML IJ SOLN
40.0000 mg | Freq: Once | INTRAMUSCULAR | Status: AC
  Administered 2017-12-06: 40 mg via INTRAVENOUS
  Filled 2017-12-06: qty 4

## 2017-12-06 MED ORDER — NYSTATIN 100000 UNIT/GM EX CREA: TOPICAL_CREAM | Freq: Two times a day (BID) | CUTANEOUS | Status: DC | PRN

## 2017-12-06 MED ORDER — DEXTROSE 5 % IV SOLN
500.0000 mg | Freq: Once | INTRAVENOUS | Status: AC
  Administered 2017-12-06: 500 mg via INTRAVENOUS
  Filled 2017-12-06: qty 500

## 2017-12-06 MED ORDER — SODIUM CHLORIDE 0.9 % IV SOLN: 250.0000 mL | INTRAVENOUS | Status: DC | PRN

## 2017-12-06 MED ORDER — INSULIN ASPART 100 UNIT/ML ~~LOC~~ SOLN
0.0000 [IU] | Freq: Three times a day (TID) | SUBCUTANEOUS | Status: DC
  Administered 2017-12-06: 2 [IU] via SUBCUTANEOUS
  Administered 2017-12-06: 3 [IU] via SUBCUTANEOUS
  Administered 2017-12-07 (×2): 2 [IU] via SUBCUTANEOUS
  Administered 2017-12-08: 3 [IU] via SUBCUTANEOUS
  Administered 2017-12-08 – 2017-12-10 (×3): 2 [IU] via SUBCUTANEOUS
  Administered 2017-12-11 – 2017-12-12 (×3): 3 [IU] via SUBCUTANEOUS
  Filled 2017-12-06: qty 1

## 2017-12-06 MED ORDER — IPRATROPIUM-ALBUTEROL 0.5-2.5 (3) MG/3ML IN SOLN
3.0000 mL | Freq: Four times a day (QID) | RESPIRATORY_TRACT | Status: AC
  Administered 2017-12-06 (×2): 3 mL via RESPIRATORY_TRACT
  Filled 2017-12-06 (×2): qty 3

## 2017-12-06 MED ORDER — DEXTROSE 5 % IV SOLN
1.0000 g | INTRAVENOUS | Status: DC
  Administered 2017-12-07 – 2017-12-11 (×5): 1 g via INTRAVENOUS
  Filled 2017-12-06 (×5): qty 10

## 2017-12-06 MED ORDER — NIACIN ER (ANTIHYPERLIPIDEMIC) 1000 MG PO TBCR: 1000.0000 mg | EXTENDED_RELEASE_TABLET | Freq: Every day | ORAL | Status: DC

## 2017-12-06 MED ORDER — FUROSEMIDE 10 MG/ML IJ SOLN
40.0000 mg | Freq: Two times a day (BID) | INTRAMUSCULAR | Status: DC
  Administered 2017-12-06 – 2017-12-07 (×2): 40 mg via INTRAVENOUS
  Filled 2017-12-06 (×2): qty 4

## 2017-12-06 MED ORDER — TAMSULOSIN HCL 0.4 MG PO CAPS
0.4000 mg | ORAL_CAPSULE | Freq: Every day | ORAL | Status: DC
  Administered 2017-12-07 – 2017-12-12 (×6): 0.4 mg via ORAL
  Filled 2017-12-06 (×6): qty 1

## 2017-12-06 MED ORDER — SODIUM CHLORIDE 0.9% FLUSH: 3.0000 mL | INTRAVENOUS | Status: DC | PRN

## 2017-12-06 MED ORDER — DARIFENACIN HYDROBROMIDE ER 15 MG PO TB24
15.0000 mg | ORAL_TABLET | Freq: Every day | ORAL | Status: DC
  Administered 2017-12-07 – 2017-12-12 (×6): 15 mg via ORAL
  Filled 2017-12-06 (×6): qty 1

## 2017-12-06 MED ORDER — ATORVASTATIN CALCIUM 80 MG PO TABS
80.0000 mg | ORAL_TABLET | Freq: Every day | ORAL | Status: DC
  Administered 2017-12-06 – 2017-12-11 (×6): 80 mg via ORAL
  Filled 2017-12-06 (×6): qty 1

## 2017-12-06 MED ORDER — OXYCODONE HCL 5 MG PO TABS
5.0000 mg | ORAL_TABLET | Freq: Four times a day (QID) | ORAL | Status: DC | PRN
  Administered 2017-12-12 (×2): 5 mg via ORAL
  Filled 2017-12-06 (×3): qty 1

## 2017-12-06 MED ORDER — NITROGLYCERIN 2 % TD OINT
1.0000 [in_us] | TOPICAL_OINTMENT | Freq: Four times a day (QID) | TRANSDERMAL | Status: DC
  Administered 2017-12-06: 1 [in_us] via TOPICAL
  Filled 2017-12-06: qty 1

## 2017-12-06 MED ORDER — FUROSEMIDE 10 MG/ML IJ SOLN: 40.0000 mg | Freq: Once | INTRAMUSCULAR | Status: DC

## 2017-12-06 MED ORDER — LOSARTAN POTASSIUM 50 MG PO TABS
50.0000 mg | ORAL_TABLET | Freq: Every day | ORAL | Status: DC
  Administered 2017-12-07 – 2017-12-10 (×4): 50 mg via ORAL
  Filled 2017-12-06 (×4): qty 1

## 2017-12-06 MED ORDER — DEXTROSE 5 % IV SOLN
1.0000 g | Freq: Once | INTRAVENOUS | Status: AC
  Administered 2017-12-06: 1 g via INTRAVENOUS
  Filled 2017-12-06: qty 10

## 2017-12-06 MED ORDER — PNEUMOCOCCAL VAC POLYVALENT 25 MCG/0.5ML IJ INJ
0.5000 mL | INJECTION | INTRAMUSCULAR | Status: DC
Start: 2017-12-07 — End: 2017-12-12
  Filled 2017-12-06: qty 0.5

## 2017-12-06 MED ORDER — MORPHINE SULFATE (PF) 4 MG/ML IV SOLN
4.0000 mg | Freq: Once | INTRAVENOUS | Status: AC
  Administered 2017-12-06: 4 mg via INTRAVENOUS
  Filled 2017-12-06: qty 1

## 2017-12-06 MED ORDER — SODIUM CHLORIDE 0.9% FLUSH
3.0000 mL | Freq: Two times a day (BID) | INTRAVENOUS | Status: DC
  Administered 2017-12-06 – 2017-12-10 (×9): 3 mL via INTRAVENOUS
  Administered 2017-12-10: 10 mL via INTRAVENOUS
  Administered 2017-12-11 – 2017-12-12 (×3): 3 mL via INTRAVENOUS

## 2017-12-06 MED ORDER — BISACODYL 10 MG RE SUPP: 10.0000 mg | RECTAL | Status: DC | PRN

## 2017-12-06 NOTE — Care Management Note (Signed)
Case Management Note  Patient Details  Name: RYKIN ROUTE MRN: [id number] Date of Birth: 1941/02/02  Subjective/Objective:                  76 y.o. male with history of prostate cancer in remission, insulin-dependent diabetes, hyperlipidemia, hypertension, venous insufficiency presents to ED for evaluation of gradually worsening shortness of breath or last 2-3 days. From home with spouse.  Action/Plan: Admit status INPATIENT (CAP); anticipate discharge [facility].   Expected Discharge Date:  (unknown)               Expected Discharge Plan:  [place]  In-House Referral:     Discharge planning Services  CM Consult  Status of Service:  In process, will continue to follow  If discussed at Long Length of Stay Meetings, dates discussed:    Additional Comments:  Fuller Mandril, RN 12/06/2017, 12:12 PM

## 2017-12-06 NOTE — ED Provider Notes (Signed)
[facility] EMERGENCY DEPARTMENT Provider Note   CSN: [id number] Arrival date & time: 12/06/17  8546     History   Chief Complaint Chief Complaint  Patient presents with  . Shortness of Breath    HPI [name] is a 76 y.o. male with history of prostate cancer in remission, insulin-dependent diabetes, hyperlipidemia, hypertension, venous insufficiency presents to ED for evaluation of gradually worsening shortness of breath or last 2-3 days. Associated with mild cough and nasal congestion. Shortness of breath is worse with laying flat and when transferring from chair to bed. He denies fevers, LE edema, abdominal pain or distention. He is unsure about personal medical conditions or daily medications. Denies heart attacks and COPD. Unsure about CHF.   Wife at bedside reports gradual, worsening functional decline over last year with 7 mechanical falls at home in the last year, 2 falls in the last 2 weeks.  Has had decreased mobility due to fear of falling. Reports left lateral rib cage pain for the last several weeks that he attributes to frequent falls, pain is sharp and worse with movement and palpation. He denies exertional chest pain. Also reports looser stools over the last several months, at least 1 loose stool daily most often early in the morning when pt wakes up in stool in the bed.    Denies fevers, exertional CP, abdominal pain, vomiting, melena, hematochezia, dysuria. Has skin break down from venous stasis that he cares for at home with dressing/compression socks.  No new wounds or swelling to legs.   HPI  Past Medical History:  Diagnosis Date  . CAP (community acquired pneumonia)   . CHF (congestive heart failure) ([place])   . Diabetes ([place])   . Hyperlipidemia   . Hypertension   . Hypotension     Patient Active Problem List   Diagnosis Date Noted  . CAP (community acquired pneumonia) 12/06/2017  . Dyspnea 12/06/2017  . Diabetes ([place])   . WOUND,  LEG 01/01/2011  . [place] SHOULDER REGION 11/23/2010  . DECUBITUS ULCER, HEEL 11/16/2010  . ADENOCARCINOMA, PROSTATE 09/13/2010  . DIABETES MELLITUS, TYPE II, ON INSULIN 09/13/2010  . HYPERLIPIDEMIA 09/13/2010  . OBESITY, MORBID 09/13/2010  . ANXIETY DEPRESSION 09/13/2010  . HYPERTENSION, BENIGN ESSENTIAL 09/13/2010  . VENOUS STASIS ULCER 09/13/2010  . Venous (peripheral) insufficiency 09/13/2010  . DEGENERATIVE JOINT DISEASE, HIPS 09/13/2010  . BACK PAIN, CHRONIC 09/13/2010  . GAIT DISTURBANCE 09/13/2010  . INCONTINENCE, URGE 09/13/2010  . HIP REPLACEMENT, BILATERAL, HX OF 09/13/2010    History reviewed. No pertinent surgical history.     Home Medications    Prior to Admission medications   Medication Sig Start Date End Date Taking? Authorizing Provider  acetaminophen (TYLENOL) 500 MG tablet Take 500 mg by mouth every 6 (six) hours as needed for mild pain.   Yes [provider]  bismuth subsalicylate (PEPTO BISMOL) 262 MG/15ML suspension Take 30 mLs by mouth every 6 (six) hours as needed for indigestion.   Yes [provider]  buPROPion (BUDEPRION XL) 300 MG 24 hr tablet Take 300 mg by mouth daily.     Yes [provider]  CALCIUM-VITAMIN D PO Take 1 tablet by mouth daily.   Yes [provider]  gabapentin (NEURONTIN) 400 MG capsule Take 400 mg by mouth 5 (five) times daily.    Yes [provider]  aspirin 325 MG tablet Take 325 mg by mouth daily.      [provider]  atorvastatin (LIPITOR) 80 MG tablet Take 80 mg by mouth at bedtime.      [provider]  bisacodyl (BISCOLAX) 10 MG suppository Place 10 mg rectally as needed.      [provider]  escitalopram (LEXAPRO) 10 MG tablet Take 10 mg by mouth daily.      [provider]  hydrOXYzine (ATARAX) 25 MG tablet Take 25 mg by mouth every 6 (six) hours as needed. For itchiness.     [provider]  insulin  NPH-insulin regular (NOVOLIN 70/30) (70-30) 100 UNIT/ML injection 26 units in AM and 12 in PM.     [provider]  losartan (COZAAR) 50 MG tablet Take 50 mg by mouth daily.      [provider]  niacin (NIASPAN) 1000 MG CR tablet Take 1,000 mg by mouth at bedtime.      [provider]  nystatin (MYCOSTATIN) cream Apply to abdominal folds two (2) times a day.     [provider]  oxyCODONE (ROXICODONE) 5 MG immediate release tablet Take 5 mg by mouth every 4 (four) hours as needed. For breakthrough.     [provider]  oxyCODONE (ROXICODONE) 5 MG immediate release tablet Take one (1) by mouth QAM and mid-day and TWO (10mg ) at bedtime.     [provider]  Silodosin (RAPAFLO) 8 MG CAPS Take 1 capsule by mouth daily.      [provider]  solifenacin (VESICARE) 10 MG tablet Take 10 mg by mouth daily.      [provider]  torsemide (DEMADEX) 10 MG tablet Take 10 mg by mouth daily.      [provider]  urea (CARMOL) 20 % cream Apply under unna boot.     [provider]  Vitamin D, Ergocalciferol, (DRISDOL) 50000 UNIT CAPS Take 50,000 Units by mouth. Once monthly x 8 weeks.     [provider]    Family History Family History  Problem Relation Age of Onset  . Coronary artery disease Father   . Coronary artery disease Mother   . Dementia Mother   . Coronary artery disease Sister     Social History Social History   Tobacco Use  . Smoking status: Never Smoker  . Smokeless tobacco: Never Used  Substance Use Topics  . Alcohol use: Yes    Alcohol/week: 0.6 oz    Types: 1 Cans of beer per week    Frequency: Never  . Drug use: No     Allergies   Patient has no known allergies.   Review of Systems Review of Systems  HENT: Positive for congestion and rhinorrhea.   Respiratory: Positive for cough and shortness of breath.   Cardiovascular: Positive for chest pain (left chest wall pain).    Gastrointestinal: Positive for diarrhea.  Skin: Positive for wound (chronic LE color changes).  Allergic/Immunologic: Positive for immunocompromised state (IDDM).  All other systems reviewed and are negative.    Physical Exam Updated Vital Signs BP (!) 151/78   Pulse 82   Temp 98.2 F (36.8 C) (Rectal)   Resp 19   Ht 6\' 2"  (1.88 m)   Wt 114.3 kg (252 lb)   SpO2 99%   BMI 32.35 kg/m   Physical Exam  Constitutional: He appears well-developed and well-nourished.  NAD. Nontoxic.  HENT:  Head: Normocephalic and atraumatic.  Nose: Nose normal.  Moist mucous membranes. Tonsils and oropharynx normal  Eyes: Conjunctivae, EOM and lids are normal.  Neck: Trachea normal and normal range of motion.  Neck is supple Trachea midline No cervical adenopathy  Cardiovascular: Normal rate, regular rhythm, S1 normal, S2 normal and normal heart sounds.  Pulses:      Carotid pulses are 2+ on the right side, and 2+ on the left side.      Radial pulses are 2+ on the right side, and 2+ on the left side.       Dorsalis pedis pulses are 2+ on the right side, and 2+ on the left side.  Questionable JVD. Patient reports being uncomfortable with head of the bed flat. RRR. No asymmetric LE edema.   Pulmonary/Chest: Effort normal and breath sounds normal. No respiratory distress. He has no decreased breath sounds. He has no wheezes. He has no rhonchi. He has no rales.  + left lower lateral rib cage tender to palpation, no crepitus or overlying rash. Decreased breath sounds to bilateral lower lobes anteriorly. Faint expiratory wheezing to left upper lobe, anteriorly. Tachypneic in 20-22 range  Abdominal: Soft. Bowel sounds are normal. There is no tenderness.  No epigastric, suprapubic or CVA tenderness. No G/R/R  Lymphadenopathy:    He has no cervical adenopathy.  Neurological: He is alert. GCS eye subscore is 4. GCS verbal subscore is 5. GCS motor subscore is 6.  Skin: Skin is warm and dry. Capillary  refill takes less than 2 seconds.  Chronic venous stasis changes to bilateral lower legs. Hyperpigmented thickened toe nails bilaterally.  Psychiatric: He has a normal mood and affect. His speech is normal and behavior is normal. Judgment and thought content normal. Cognition and memory are normal.     ED Treatments / Results  Labs (all labs ordered are listed, but only abnormal results are displayed) Labs Reviewed  CBC WITH DIFFERENTIAL/PLATELET - Abnormal; Notable for the following components:      Result Value   RBC 3.18 (*)    Hemoglobin 9.3 (*)    HCT 29.1 (*)    All other components within normal limits  BRAIN NATRIURETIC PEPTIDE - Abnormal; Notable for the following components:   B Natriuretic Peptide 1,657.7 (*)    All other components within normal limits  BASIC METABOLIC PANEL - Abnormal; Notable for the following components:   Glucose, Bld 189 (*)    Creatinine, Ser 1.39 (*)    Calcium 8.4 (*)    GFR calc non Af Amer 48 (*)    GFR calc Af Amer 56 (*)    All other components within normal limits  CULTURE, BLOOD (ROUTINE X 2)  CULTURE, BLOOD (ROUTINE X 2)  URINE CULTURE  MAGNESIUM  URINALYSIS, ROUTINE W REFLEX MICROSCOPIC  I-STAT TROPONIN, ED  I-STAT TROPONIN, ED  I-STAT CG4 LACTIC ACID, ED  POC OCCULT BLOOD, ED    EKG  EKG Interpretation  Date/Time:  Thursday December 06 2017 08:36:48 EST Ventricular Rate:  95 PR Interval:    QRS Duration: 143 QT Interval:  406 QTC Calculation: 511 R Axis:   -92 Text Interpretation:  Sinus rhythm RBBB and LAFB Right bundle branch block New since previous tracing Confirmed by Orlie Dakin [phone]) on 12/06/2017 9:29:57 AM       Radiology Dg Chest 2 View  Result Date: 12/06/2017 CLINICAL DATA:  Shortness of breath increasing over the past week. History of CHF, prostate malignancy, diabetes, morbid obesity. EXAM: CHEST  2 VIEW COMPARISON:  PA and lateral chest x-ray of June 26, 2017 FINDINGS: The lungs are adequately  inflated. The interstitial markings  are mildly increased and are more conspicuous than in the past. There are confluent airspace opacities at the right lung base and in the left infrahilar region. There may be a small amount of pleural fluid blunting the right lateral and posterior costophrenic angles. The cardiac silhouette is enlarged. The pulmonary vascularity is mildly prominent though stable. There is calcification in the wall of the thoracic aorta. There is multilevel degenerative disc disease of the thoracic spine. IMPRESSION: Probable acute bilateral pneumonia superimposed upon low-grade CHF. New small right pleural effusion. Thoracic aortic atherosclerosis. Electronically Signed   By: David  Martinique M.D.   On: 12/06/2017 09:55    Procedures Procedures (including critical care time)  Medications Ordered in ED Medications  azithromycin (ZITHROMAX) 500 mg in dextrose 5 % 250 mL IVPB (500 mg Intravenous New Bag/Given 12/06/17 1129)  nitroGLYCERIN (NITROGLYN) 2 % ointment 1 inch (1 inch Topical Given 12/06/17 1128)  aspirin chewable tablet 81 mg (81 mg Oral Given 12/06/17 1036)  ipratropium-albuterol (DUONEB) 0.5-2.5 (3) MG/3ML nebulizer solution 3 mL (3 mLs Nebulization Given 12/06/17 1036)  cefTRIAXone (ROCEPHIN) 1 g in dextrose 5 % 50 mL IVPB (0 g Intravenous Stopped 12/06/17 1117)  furosemide (LASIX) injection 40 mg (40 mg Intravenous Given 12/06/17 1128)     Initial Impression / Assessment and Plan / ED Course  I have reviewed the triage vital signs and the nursing notes.  Pertinent labs & imaging results that were available during my care of the patient were reviewed by me and considered in my medical decision making (see chart for details).  Clinical Course as of Dec 06 1138  Thu Dec 06, 2017  1002 IMPRESSION: Probable acute bilateral pneumonia superimposed upon low-grade CHF. New small right pleural effusion.  Thoracic aortic atherosclerosis. DG Chest 2 View [CG]  1054 B  Natriuretic Peptide: (!) 1,657.7 [CG]  1054 Hemoglobin: (!) 9.3 [CG]  1054 Creatinine: (!) 1.39 [CG]  1054 GFR, Est Non African American: (!) 48 [CG]  1054 Temp: 98.2 F (36.8 C) [CG]  1115 Sinus rhythm RBBB and LAFB Right bundle branch block New since previous tracing ED EKG [CG]    Clinical Course User Index [CG] Kinnie Feil, PA-C   76 year old male presents to ED for gradually worsening shortness of breath associated with mild cough and nasal congestion.  On exam, he reports orthopnea. He has decreased breath sounds bilateral lower lobes. No significant rales or wheezing. He has 1+ pitting edema bilaterally in lower legs. Chronic venous insufficiency skin changes bilaterally without evidence of acute cellulitis.  Lab work remarkable for elevated BNP at 1657. Slight elevation of creatinine at 1.39. Hemoglobin 9.3, negative Hemoccults. Chest x-ray shows acute bilateral pneumonia with superimposed low-grade CHF and small pleural effusion.  Final Clinical Impressions(s) / ED Diagnoses   History, exam and workup so far consistent with community-acquired pneumonia with acute CHF. Patient given nitro paste, lasix, aspirin,antibiotics, breathing treatments in the ED. Blood cultures and stool samples sent. He complains of left lateral rib cage pain for several weeks, this does not sound cardiac in nature given recent mechanical falls. We will request admission pending u/a.   Pt verbalized to RN of R hip pain, given recent falls will get x-ray.  Final diagnoses:  Diabetes mellitus due to underlying condition with hyperosmolarity and coma, with long-term current use of insulin [facility])    ED Discharge Orders    None       Arlean Hopping 12/06/17 1246    Orlie Dakin, MD  12/06/17 1737  

## 2017-12-06 NOTE — ED Notes (Signed)
Patient in XRAY at this time. 

## 2017-12-06 NOTE — ED Notes (Signed)
Pt aware of need for urine  

## 2017-12-06 NOTE — ED Triage Notes (Signed)
Pt from home with shortness of breath increasing over the last week. Pt has history of CHF. Pt A&O x4

## 2017-12-06 NOTE — Plan of Care (Signed)
  Health Behavior/Discharge Planning: Ability to manage health-related needs will improve 12/06/2017 1617 - Not Progressing by Imagene Gurney, RN

## 2017-12-06 NOTE — ED Provider Notes (Signed)
Patient complains of chronic dyspnea however becoming worse over the past 2-3 days.  He denies any cough denies any fever.  Shortness of breath worse with lying supine and improved with sitting upright.  He also complains of pain at right proximal thigh and right hip as a result of a fall several days ago which occurred when transferring himself from his wheelchair.  Patient appears in no respiratory distress lungs with diffuse scant rales.  Abdomen soft nontender.  Heart regular rate and rhythm.  Pelvis is stable.  He is mildly tender at the hip.  There is no pain on internal or external rotation of the right thigh.  All other extremities without contusion abrasion or tenderness neurovascular intact. X-rays viewed by me.  Favor CHF over pneumonia in light of elevated BNP, no leukocytosis, no cough no fever   [name] Dakin, MD 12/06/17 1737

## 2017-12-06 NOTE — ED Notes (Signed)
Patient transported to X-ray 

## 2017-12-06 NOTE — ED Notes (Addendum)
Patient unable to void at this time, But he is aware we need a UA. Urinal at bedside. Patient unable to walk.

## 2017-12-06 NOTE — H&P (Signed)
History and Physical    [name] VEL:[id number] DOB: November 25, 1941 DOA: 12/06/2017  PCP: Kathyrn Lass, MD Patient coming from: home  Chief Complaint: dyspnea  HPI: [name] is a 77 y.o. male with medical history significant for prostate cancer, hypertension, bacteremia, anxiety/depression, venous insufficiency, venous stasis ulcers, diabetes, peripheral neuropathy, since to the emergency Department chief complaint of worsening shortness of breath. Initial evaluation reveals community-acquired pneumonia in the setting of acute heart failure. [facility] are asked to admit  Information is obtained from the patient and his family his at the bedside. Patient reports a gradual worsening shortness of breath over the last several days but the last 24 hours "got a whole lot worse". Associated symptoms include chest congestion without a real cough, orthopnea, decreased oral intake and generalized weakness. He denies fever chills syncope near-syncope, worsening lower extremity edema nausea vomiting diarrhea constipation melena bright red blood per rectum. He denies dysuria hematuria frequency or urgency. The wife does report however that he has become incontinent of stool of late. Family reports patient has not been ambulatory for at least 6 years. His ability to bear weight and pivot has decreased over the last couple of weeks.   ED Course: The emergency department he is afebrile hemodynamically stable with a blood pressure high end of normal tachypnea not hypoxic. He is provided with 40 mg Lasix IV Rocephin and azithromycin.  Review of Systems: As per HPI otherwise all other systems reviewed and are negative.   Ambulatory Status: Patient mostly bedbound and has been so for the last 6 years.  Past Medical History:  Diagnosis Date  . CAP (community acquired pneumonia)   . CHF (congestive heart failure) ([place])   . Diabetes ([place])   . Hyperlipidemia   . Hypertension   . Hypotension     . Pleural effusion on right   . Prostate cancer ([place])   . Ulcers of both lower extremities ([place])   . Venous insufficiency (chronic) (peripheral)     History reviewed. No pertinent surgical history.  Social History   Socioeconomic History  . Marital status: Married    Spouse name: Levander Campion  . Number of children: Not on file  . Years of education: Not on file  . Highest education level: Not on file  Social Needs  . Financial resource strain: Not on file  . Food insecurity - worry: Not on file  . Food insecurity - inability: Not on file  . Transportation needs - medical: Not on file  . Transportation needs - non-medical: Not on file  Occupational History  . Not on file  Tobacco Use  . Smoking status: Never Smoker  . Smokeless tobacco: Never Used  Substance and Sexual Activity  . Alcohol use: Yes    Alcohol/week: 0.6 oz    Types: 1 Cans of beer per week    Frequency: Never  . Drug use: No  . Sexual activity: Not on file  Other Topics Concern  . Not on file  Social History Narrative  . Not on file    No Known Allergies  Family History  Problem Relation Age of Onset  . Coronary artery disease Father   . Coronary artery disease Mother   . Dementia Mother   . Coronary artery disease Sister     Prior to Admission medications   Medication Sig Start Date End Date Taking? Authorizing Provider  acetaminophen (TYLENOL) 500 MG tablet Take 500 mg by mouth every 6 (six) hours as needed for  mild pain.   Yes [provider]  bismuth subsalicylate (PEPTO BISMOL) 262 MG/15ML suspension Take 30 mLs by mouth every 6 (six) hours as needed for indigestion.   Yes [provider]  buPROPion (BUDEPRION XL) 300 MG 24 hr tablet Take 300 mg by mouth daily.     Yes [provider]  CALCIUM-VITAMIN D PO Take 1 tablet by mouth daily.   Yes [provider]  gabapentin (NEURONTIN) 400 MG capsule Take 400 mg by mouth 5 (five) times daily.    Yes [provider]  aspirin 325 MG tablet Take 325 mg by mouth daily.      [provider]  atorvastatin (LIPITOR) 80 MG tablet Take 80 mg by mouth at bedtime.      [provider]  bisacodyl (BISCOLAX) 10 MG suppository Place 10 mg rectally as needed.      [provider]  escitalopram (LEXAPRO) 10 MG tablet Take 10 mg by mouth daily.      [provider]  hydrOXYzine (ATARAX) 25 MG tablet Take 25 mg by mouth every 6 (six) hours as needed. For itchiness.     [provider]  insulin NPH-insulin regular (NOVOLIN 70/30) (70-30) 100 UNIT/ML injection 26 units in AM and 12 in PM.     [provider]  losartan (COZAAR) 50 MG tablet Take 50 mg by mouth daily.      [provider]  niacin (NIASPAN) 1000 MG CR tablet Take 1,000 mg by mouth at bedtime.      [provider]  nystatin (MYCOSTATIN) cream Apply to abdominal folds two (2) times a day.     [provider]  oxyCODONE (ROXICODONE) 5 MG immediate release tablet Take 5 mg by mouth every 4 (four) hours as needed. For breakthrough.     [provider]  oxyCODONE (ROXICODONE) 5 MG immediate release tablet Take one (1) by mouth QAM and mid-day and TWO (10mg ) at bedtime.     [provider]  Silodosin (RAPAFLO) 8 MG CAPS Take 1 capsule by mouth daily.      [provider]  solifenacin (VESICARE) 10 MG tablet Take 10 mg by mouth daily.      [provider]  torsemide (DEMADEX) 10 MG tablet Take 10 mg by mouth daily.      [provider]  urea (CARMOL) 20 % cream Apply under unna boot.     [provider]  Vitamin D, Ergocalciferol, (DRISDOL) 50000 UNIT CAPS Take 50,000 Units by mouth. Once monthly x 8 weeks.     [provider]    Physical Exam: Vitals:   12/06/17 1032 12/06/17 1100 12/06/17 1130 12/06/17 1200  BP:  (!) 156/90 (!) 151/78 (!) 163/85  Pulse:  87 82 84  Resp:  17 19 (!) 23  Temp: 98.2 F (36.8 C)      TempSrc: Rectal     SpO2:  96% 99% 97%  Weight:      Height:         General:  Appears chronically ill obese mild respiratory distress Eyes:  PERRL, EOMI, normal lids, iris ENT:  grossly normal hearing, lips & tongue, his membranes of his mouth are slightly pale somewhat dry very poor dentition Neck:  no LAD, masses or thyromegaly Cardiovascular:  RRR, no m/r/g. 1+LE edema bilaterally. Chronic venous stasis changes bilaterally  Respiratory:  Mild to moderate increased work of breathing with conversation. Breath sounds quite diminished bilateral bases. Faint  end expiratory wheezing. Abdomen:  soft, ntnd, NABS Skin:  Chronic venous stasis changes bilateral lower extremity wise no rash no lesions. Does have chronic stasis ulcers particularly left heel left shin right great toe Musculoskeletal:  grossly normal tone BUE/BLE, good ROM, no bony abnormality Psychiatric:  grossly normal mood and affect, speech fluent and appropriate, AOx3 Neurologic:  CN 2-12 grossly intact, moves all extremities in coordinated fashion, sensation intact   Labs on Admission: I have personally reviewed following labs and imaging studies  CBC: Recent Labs  Lab 12/06/17 0926  WBC 6.6  NEUTROABS 4.8  HGB 9.3*  HCT 29.1*  MCV 91.5  PLT 400   Basic Metabolic Panel: Recent Labs  Lab 12/06/17 0926  NA 140  K 3.9  CL 106  CO2 25  GLUCOSE 189*  BUN 20  CREATININE 1.39*  CALCIUM 8.4*  MG 1.9   GFR: Estimated Creatinine Clearance: 62.6 mL/min (A) (by C-G formula based on SCr of 1.39 mg/dL (H)). Liver Function Tests: No results for input(s): AST, ALT, ALKPHOS, BILITOT, PROT, ALBUMIN in the last 168 hours. No results for input(s): LIPASE, AMYLASE in the last 168 hours. No results for input(s): AMMONIA in the last 168 hours. Coagulation Profile: No results for input(s): INR, PROTIME in the last 168 hours. Cardiac Enzymes: No results for input(s): CKTOTAL, CKMB, CKMBINDEX, TROPONINI in the last 168  hours. BNP (last 3 results) No results for input(s): PROBNP in the last 8760 hours. HbA1C: No results for input(s): HGBA1C in the last 72 hours. CBG: Recent Labs  Lab 12/06/17 1219  GLUCAP 150*   Lipid Profile: No results for input(s): CHOL, HDL, LDLCALC, TRIG, CHOLHDL, LDLDIRECT in the last 72 hours. Thyroid Function Tests: No results for input(s): TSH, T4TOTAL, FREET4, T3FREE, THYROIDAB in the last 72 hours. Anemia Panel: No results for input(s): VITAMINB12, FOLATE, FERRITIN, TIBC, IRON, RETICCTPCT in the last 72 hours. Urine analysis:    Component Value Date/Time   COLORURINE YELLOW 06/09/2011 0522   APPEARANCEUR CLEAR 06/09/2011 0522   LABSPEC 1.019 06/09/2011 0522   PHURINE 5.5 06/09/2011 0522   GLUCOSEU 100 (A) 06/09/2011 0522   HGBUR TRACE (A) 06/09/2011 0522   BILIRUBINUR NEGATIVE 06/09/2011 0522   KETONESUR NEGATIVE 06/09/2011 0522   PROTEINUR NEGATIVE 06/09/2011 0522   UROBILINOGEN 0.2 06/09/2011 0522   NITRITE NEGATIVE 06/09/2011 0522   LEUKOCYTESUR NEGATIVE 06/09/2011 0522    Creatinine Clearance: Estimated Creatinine Clearance: 62.6 mL/min (A) (by C-G formula based on SCr of 1.39 mg/dL (H)).  Sepsis Labs: @LABRCNTIP (procalcitonin:4,lacticidven:4) )No results found for this or any previous visit (from the past 240 hour(s)).   Radiological Exams on Admission: Dg Chest 2 View  Result Date: 12/06/2017 CLINICAL DATA:  Shortness of breath increasing over the past week. History of CHF, prostate malignancy, diabetes, morbid obesity. EXAM: CHEST  2 VIEW COMPARISON:  PA and lateral chest x-ray of June 26, 2017 FINDINGS: The lungs are adequately inflated. The interstitial markings are mildly increased and are more conspicuous than in the past. There are confluent airspace opacities at the right lung base and in the left infrahilar region. There may be a small amount of pleural fluid blunting the right lateral and posterior costophrenic angles. The cardiac silhouette is  enlarged. The pulmonary vascularity is mildly prominent though stable. There is calcification in the wall of the thoracic aorta. There is multilevel degenerative disc disease of the thoracic spine. IMPRESSION: Probable acute bilateral pneumonia superimposed upon low-grade CHF. New small right pleural effusion. Thoracic aortic atherosclerosis. Electronically  Signed   By: David  Martinique M.D.   On: 12/06/2017 09:55    EKG: Independently reviewed. Sinus rhythm RBBB and LAFB Right bundle branch block New since previous tracing  Assessment/Plan Principal Problem:   CAP (community acquired pneumonia) Active Problems:   ADENOCARCINOMA, PROSTATE   OBESITY, MORBID   ANXIETY DEPRESSION   HYPERTENSION, BENIGN ESSENTIAL   VENOUS STASIS ULCER   Venous (peripheral) insufficiency   Decubitus ulcer of heel   Dyspnea   Acute heart failure (HCC)   Pleural effusion on right   #1. Shortness of breath. Likely multifactorial specifically community-acquired pneumonia in the setting of acute heart failure likely diastolic. Patient presents with tachypnea. Chest x-ray reveals probable acute bilateral pneumonia superimposed upon CHF as well as new small right pleural effusion. Oxygen saturation level greater than 90% on room air. EKG as noted above. Lactic acid within the limits of normal afebrile no leukocytosis nontoxic appearing -Admit to telemetry -Follow blood cultures -Obtain sputum culture -Strep pneumo urine antigen -Influenza panel -Azithromycin and Rocephin -Nebulizers -Lasix 40 mg IV twice a day -Cycle troponin -Monitor intake and output -Daily weights -Obtain a 2-D echo  #2. Community-acquired pneumonia. - See #1 -antibiotics per protocol  #3. Acute heart failure. Echo done in May of this year reveals an EF of 60% moderate LVH and grade 1 diastolic dysfunction. Home medications include Cozaar and Demadex. BNP greater than 1600. Chest x-ray as noted above.Marland Kitchen He is provided with 40 mg of Lasix  intravenously in the emergency department -Continue Lasix 40 mg IV twice a day -Monitor intake and output -Daily weights -Continue home meds -2-D echo  #4. Diabetes. Home medications include insulin. Serum glucose 187 on admission. -Obtain hemoglobin A1c -Hold his home 70/30 as his appetite unreliable -Sliding scale insulin for optimal control -Monitor  #5. Acute kidney injury. Creatinine 1.39 on admission. Likely a chronic component however most recent lab work from 2012 as patient goes to the [place] nephrotoxins -Monitor urine output -If no improvement consider a renal ultrasound  #6. Chronic venous stasis ulcers bilateral lower extremity edema. Large ulcer on left heel. Patient reports Unna boot recently removed. They look stable at baseline -Wound consult -Physical therapy  7. Hypertension. Controlled in the emergency department. -Continue home meds -Lasix as noted above  #8.obesity. bmi 32.3 -nutritional concert   DVT prophylaxis: lovenox  Code Status: full  Family Communication: wife and son at bedside  Disposition Plan: likely needs snf  Consults called: none  Admission status: inpatient    Radene Gunning MD [facility]  If 7PM-7AM, please contact night-coverage [email or web address] Password [facility]  12/06/2017, 12:27 PM

## 2017-12-06 NOTE — ED Notes (Signed)
Per family pt doesn't walk normally he just stands and turns.

## 2017-12-07 ENCOUNTER — Inpatient Hospital Stay (HOSPITAL_COMMUNITY): Payer: Medicare Other

## 2017-12-07 DIAGNOSIS — I509 Heart failure, unspecified: Secondary | ICD-10-CM

## 2017-12-07 DIAGNOSIS — E0801 Diabetes mellitus due to underlying condition with hyperosmolarity with coma: Secondary | ICD-10-CM

## 2017-12-07 DIAGNOSIS — I351 Nonrheumatic aortic (valve) insufficiency: Secondary | ICD-10-CM

## 2017-12-07 DIAGNOSIS — Z794 Long term (current) use of insulin: Secondary | ICD-10-CM

## 2017-12-07 LAB — BASIC METABOLIC PANEL WITH GFR
Anion gap: 8 (ref 5–15)
BUN: 27 mg/dL — ABNORMAL HIGH (ref 6–20)
CO2: 24 mmol/L (ref 22–32)
Calcium: 8.2 mg/dL — ABNORMAL LOW (ref 8.9–10.3)
Chloride: 107 mmol/L (ref 101–111)
Creatinine, Ser: 1.7 mg/dL — ABNORMAL HIGH (ref 0.61–1.24)
GFR calc Af Amer: 44 mL/min — ABNORMAL LOW (ref >60)
GFR calc non Af Amer: 38 mL/min — ABNORMAL LOW (ref >60)
Glucose, Bld: 177 mg/dL — ABNORMAL HIGH (ref 65–99)
Potassium: 4.3 mmol/L (ref 3.5–5.1)
Sodium: 139 mmol/L (ref 135–145)

## 2017-12-07 LAB — GLUCOSE, CAPILLARY
Glucose-Capillary: 141 mg/dL — ABNORMAL HIGH (ref 65–99)
Glucose-Capillary: 120 mg/dL — ABNORMAL HIGH (ref 65–99)
Glucose-Capillary: 138 mg/dL — ABNORMAL HIGH (ref 65–99)
Glucose-Capillary: 163 mg/dL — ABNORMAL HIGH (ref 65–99)

## 2017-12-07 LAB — GASTROINTESTINAL PANEL BY PCR, STOOL (REPLACES STOOL CULTURE)

## 2017-12-07 LAB — ECHOCARDIOGRAM COMPLETE
Height: 74 in
Weight: 4144.65 oz

## 2017-12-07 LAB — URINE CULTURE: Culture: NO GROWTH

## 2017-12-07 MED ORDER — FUROSEMIDE 20 MG PO TABS
20.0000 mg | ORAL_TABLET | Freq: Every day | ORAL | Status: DC
  Administered 2017-12-08: 20 mg via ORAL
  Filled 2017-12-07: qty 1

## 2017-12-07 MED ORDER — PERFLUTREN LIPID MICROSPHERE
1.0000 mL | INTRAVENOUS | Status: AC | PRN
  Administered 2017-12-07: 2 mL via INTRAVENOUS

## 2017-12-07 NOTE — Clinical Social Work Note (Signed)
CSW acknowledges consult "Family needs assistance in providing at home care." [facility] notified.  CSW signing off. Consult again if any social work needs arise.  [name], [place]

## 2017-12-07 NOTE — Evaluation (Signed)
Physical Therapy Evaluation Patient Details Name: [name] MRN: [id number] DOB: 1941/05/27 Today's Date: 12/07/2017   History of Present Illness  Pt is a 76 y/o male who presents with SOB. He was found to have CAP. PMH significant for venous insufficiency, prostate CA, DM, CHF, CAP.   Clinical Impression  Pt admitted with above diagnosis. Pt currently with functional limitations due to the deficits listed below (see PT Problem List). At the time of PT eval pt minimally participating with mobility and transfers. Goal of session was to transition to EOB however pt was unwilling to do so once transfer was initiated. Son present at end of session and reports that pt has not been ambulatory in about 6 years with an increase in weakness noted in the last 2 weeks. Son also states that pt's wife is not in good health and will not likely be able to assist the patient physically around the house. Son reports that the family is looking for a rehab stay to hopefully improve overall function for the pt to eventually return home. Pt will benefit from skilled PT to increase their independence and safety with mobility to allow discharge to the venue listed below.       Follow Up Recommendations SNF;Supervision/Assistance - 24 hour    Equipment Recommendations  None recommended by PT(TBD by next venue of care)    Recommendations for Other Services OT consult     Precautions / Restrictions Precautions Precautions: Fall Precaution Comments: Transfers only at baseline per family. Restrictions Weight Bearing Restrictions: No      Mobility  Bed Mobility Overal bed mobility: Needs Assistance Bed Mobility: Supine to Sit           General bed mobility comments: Attempted supine to sit with +2 assist. Pt not initiating any movement and repeatedly closes eyes throughout encounter. Pt got slightly agitated with attempt to transfer to EOB and states he just wants to sleep at this time.   Transfers                     Ambulation/Gait                Stairs            Wheelchair Mobility    Modified Rankin (Stroke Patients Only)       Balance Overall balance assessment: Needs assistance                                           Pertinent Vitals/Pain Pain Assessment: No/denies pain    Home Living Family/patient expects to be discharged to:: Private residence Living Arrangements: Spouse/significant other Available Help at Discharge: Family;Available 24 hours/day Type of Home: House         Home Equipment: Walker - 2 wheels;Wheelchair - manual      Prior Function Level of Independence: Needs assistance   Gait / Transfers Assistance Needed: Per son, pt has been nonambulatory for 6 years, and has been performing transfers only from wheelchair to wheelchair (apparently he has about 4 placed throughout his home).  ADL's / Homemaking Assistance Needed: Pt has been sponge bathing, and not able to get in the shower per son        Hand Dominance        Extremity/Trunk Assessment   Upper Extremity Assessment Upper Extremity Assessment: Generalized weakness  Lower Extremity Assessment Lower Extremity Assessment: Generalized weakness       Communication   Communication: Other (comment)(Difficult to understand at times)  Cognition Arousal/Alertness: Lethargic Behavior During Therapy: Flat affect Overall Cognitive Status: Difficult to assess                                        General Comments      Exercises     Assessment/Plan    PT Assessment Patient needs continued PT services  PT Problem List Decreased strength;Decreased range of motion;Decreased activity tolerance;Decreased balance;Decreased mobility;Decreased knowledge of use of DME;Decreased safety awareness;Decreased knowledge of precautions;Cardiopulmonary status limiting activity       PT Treatment Interventions DME instruction;Stair  training;Functional mobility training;Therapeutic activities;Therapeutic exercise;Neuromuscular re-education;Patient/family education;Gait training;Wheelchair mobility training    PT Goals (Current goals can be found in the Care Plan section)  Acute Rehab PT Goals PT Goal Formulation: Patient unable to participate in goal setting Time For Goal Achievement: 12/21/17 Potential to Achieve Goals: Good    Frequency Min 2X/week   Barriers to discharge Decreased caregiver support Pt's son reports that pt's wife is not in good health and may not be able to assist him physically around the house.     Co-evaluation               AM-PAC PT "6 Clicks" Daily Activity  Outcome Measure Difficulty turning over in bed (including adjusting bedclothes, sheets and blankets)?: Unable Difficulty moving from lying on back to sitting on the side of the bed? : Unable Difficulty sitting down on and standing up from a chair with arms (e.g., wheelchair, bedside commode, etc,.)?: Unable Help needed moving to and from a bed to chair (including a wheelchair)?: Total Help needed walking in hospital room?: Total Help needed climbing 3-5 steps with a railing? : Total 6 Click Score: 6    End of Session   Activity Tolerance: Patient limited by lethargy Patient left: in bed;with call bell/phone within reach Nurse Communication: Mobility status PT Visit Diagnosis: Muscle weakness (generalized) (M62.81);Other abnormalities of gait and mobility (R26.89)    Time: 1425-1440 PT Time Calculation (min) (ACUTE ONLY): 15 min   Charges:   PT Evaluation $PT Eval Moderate Complexity: 1 Mod     PT G Codes:        [name], PT, DPT Acute Rehabilitation Services Pager: [phone]   [name] 12/07/2017, 3:03 PM

## 2017-12-07 NOTE — Progress Notes (Signed)
  Echocardiogram 2D Echocardiogram with definity has been performed.  Extremely difficult exam due to patient difficulty laying in LLD position. Patient chest wall prohibiting accurate views.   [name] L Androw 12/07/2017, 10:56 AM

## 2017-12-07 NOTE — Plan of Care (Signed)
  Health Behavior/Discharge Planning: Ability to manage health-related needs will improve 12/07/2017 1033 - Not Progressing by Imagene Gurney, RN

## 2017-12-07 NOTE — Progress Notes (Signed)
Patient ID: [name], male   DOB: [birth year], 76 y.o.   MRN: [id number]                                                                PROGRESS NOTE                                                                                                                                                                                                             Patient Demographics:    [name], is a 76 y.o. male, DOB - 07-03-1941, QIH:[id number]  Admit date - 12/06/2017   Admitting Physician Desiree Hane, MD  Outpatient Primary MD for the patient is Kathyrn Lass, MD  LOS - 1  Outpatient Specialists:     Chief Complaint  Patient presents with  . Shortness of Breath       Brief Narrative    76 y.o. male with medical history significant for prostate cancer, hypertension, bacteremia, anxiety/depression, venous insufficiency, venous stasis ulcers, diabetes, peripheral neuropathy, since to the emergency Department chief complaint of worsening shortness of breath. Initial evaluation reveals community-acquired pneumonia in the setting of acute heart failure. [facility] are asked to admit  Information is obtained from the patient and his family his at the bedside. Patient reports a gradual worsening shortness of breath over the last several days but the last 24 hours "got a whole lot worse". Associated symptoms include chest congestion without a real cough, orthopnea, decreased oral intake and generalized weakness. He denies fever chills syncope near-syncope, worsening lower extremity edema nausea vomiting diarrhea constipation melena bright red blood per rectum. He denies dysuria hematuria frequency or urgency. The wife does report however that he has become incontinent of stool of late. Family reports patient has not been ambulatory for at least 6 years. His ability to bear weight and pivot has decreased over the last couple of weeks.   ED Course: The emergency department he is  afebrile hemodynamically stable with a blood pressure high end of normal tachypnea not hypoxic. He is provided with 40 mg Lasix IV Rocephin and azithromycin.      Subjective:    [name] today has been feeling better.  Dyspnea improved. Slight cough w yellow sputum.   Denies fever, chill, cp, palp, n/v, diarrhea, brbpr, black  stool.  No headache, No abdominal pain - No Nausea, No new weakness tingling or numbness.     Assessment  & Plan :    Principal Problem:   CAP (community acquired pneumonia) Active Problems:   ADENOCARCINOMA, PROSTATE   OBESITY, MORBID   ANXIETY DEPRESSION   HYPERTENSION, BENIGN ESSENTIAL   VENOUS STASIS ULCER   Venous (peripheral) insufficiency   Decubitus ulcer of heel   Dyspnea   Acute heart failure (HCC)   Pleural effusion on right    #1. Shortness of breath. Likely multifactorial specifically community-acquired pneumonia in the setting of acute heart failure likely diastolic. Patient presents with tachypnea. Chest x-ray reveals probable acute bilateral pneumonia superimposed upon CHF as well as new small right pleural effusion. Oxygen saturation level greater than 90% on room air. EKG as noted above. Lactic acid within the limits of normal afebrile no leukocytosis nontoxic appearing -Influenza panel negative -Azithromycin and Rocephin -Nebulizers -Lasix 40 mg IV twice a day=> lasix 20mg  po qday -Obtain a 2-D echo pending  #2. Community-acquired pneumonia. - See #1 -antibiotics per protocol  #3. Acute heart failure. Echo done in May of this year reveals an EF of 60% moderate LVH and grade 1 diastolic dysfunction. Home medications include Cozaar and Demadex. BNP greater than 1600. Chest x-ray as noted above.Marland Kitchen He is provided with 40 mg of Lasix intravenously in the emergency department -Continue Lasix 40 mg IV twice a day=> lasix 20mg  po qday -Monitor intake and output -Daily weights -Continue home meds -2-D echo  #4. Diabetes. Home  medications include insulin. Serum glucose 187 on admission. -Obtain hemoglobin A1c -Hold his home 70/30 as his appetite unreliable -Sliding scale insulin for optimal control -Monitor  #5. Acute kidney injury. Creatinine 1.39 on admission. Likely a chronic component however most recent lab work from 2012 as patient goes to the [place] [facility] -Monitor urine output -If no improvement consider a renal ultrasound  #6. Chronic venous stasis ulcers bilateral lower extremity edema. Large ulcer on left heel. Patient reports Unna boot recently removed. They look stable at baseline -Wound consult -Physical therapy  7. Hypertension. Controlled in the emergency department. -Continue home meds -Lasix as noted above  #8.obesity. bmi 32.3 -nutritional concert   DVT prophylaxis: lovenox  Code Status: full  Family Communication: wife and son at bedside  Disposition Plan: likely needs snf  Consults called: none  Admission status: inpatient      Lab Results  Component Value Date   PLT 274 12/06/2017    Antibiotics  :  Rocephin/ zithromax 12/28=>  Anti-infectives (From admission, onward)   Start     Dose/Rate Route Frequency Ordered Stop   12/07/17 1100  azithromycin (ZITHROMAX) 500 mg in dextrose 5 % 250 mL IVPB     500 mg 250 mL/hr over 60 Minutes Intravenous Every 24 hours 12/06/17 1143 12/13/17 1059   12/07/17 1000  cefTRIAXone (ROCEPHIN) 1 g in dextrose 5 % 50 mL IVPB     1 g 100 mL/hr over 30 Minutes Intravenous Every 24 hours 12/06/17 1143 12/13/17 0959   12/06/17 1015  cefTRIAXone (ROCEPHIN) 1 g in dextrose 5 % 50 mL IVPB     1 g 100 mL/hr over 30 Minutes Intravenous  Once 12/06/17 1004 12/06/17 1117   12/06/17 1015  azithromycin (ZITHROMAX) 500 mg in dextrose 5 % 250 mL IVPB     500 mg 250 mL/hr over 60 Minutes Intravenous  Once 12/06/17 1004 12/06/17 1229  Objective:   Vitals:   12/06/17 2044 12/07/17 0059 12/07/17 0509 12/07/17 0534  BP:  123/67   137/77  Pulse:  67  (!) 107  Resp:  14  16  Temp:  98.6 F (37 C)  (!) 97.5 F (36.4 C)  TempSrc:  Oral  Oral  SpO2: 96% 94%  96%  Weight:   116.4 kg (256 lb 9.9 oz) 117.5 kg (259 lb 0.7 oz)  Height:        Wt Readings from Last 3 Encounters:  12/07/17 117.5 kg (259 lb 0.7 oz)  01/12/11 (!) 133.8 kg (295 lb)  12/20/10 (!) 143.8 kg (317 lb)     Intake/Output Summary (Last 24 hours) at 12/07/2017 1039 Last data filed at 12/07/2017 0845 Gross per 24 hour  Intake 835 ml  Output 800 ml  Net 35 ml     Physical Exam  Awake Alert, Oriented X 3, No new F.N deficits, Normal affect [place].AT,PERRAL Supple Neck,No JVD, No cervical lymphadenopathy appriciated.  Symmetrical Chest wall movement, Good air movement bilaterally, slight crackles bilateral base, no wheezing RRR,No Gallops,Rubs or new Murmurs, No Parasternal Heave +ve B.Sounds, Abd Soft, No tenderness, No organomegaly appriciated, No rebound - guarding or rigidity. No Cyanosis, Clubbing or edema, No new Rash or bruise     Data Review:    CBC Recent Labs  Lab 12/06/17 0926  WBC 6.6  HGB 9.3*  HCT 29.1*  PLT 274  MCV 91.5  MCH 29.2  MCHC 32.0  RDW 13.8  LYMPHSABS 0.9  MONOABS 0.5  EOSABS 0.3  BASOSABS 0.0    Chemistries  Recent Labs  Lab 12/06/17 0926 12/07/17 0428  NA 140 139  K 3.9 4.3  CL 106 107  CO2 25 24  GLUCOSE 189* 177*  BUN 20 27*  CREATININE 1.39* 1.70*  CALCIUM 8.4* 8.2*  MG 1.9  --    ------------------------------------------------------------------------------------------------------------------ No results for input(s): CHOL, HDL, LDLCALC, TRIG, CHOLHDL, LDLDIRECT in the last 72 hours.  Lab Results  Component Value Date   HGBA1C 6.8 (H) 12/06/2017   ------------------------------------------------------------------------------------------------------------------ No results for input(s): TSH, T4TOTAL, T3FREE, THYROIDAB in the last 72 hours.  Invalid input(s):  FREET3 ------------------------------------------------------------------------------------------------------------------ No results for input(s): VITAMINB12, FOLATE, FERRITIN, TIBC, IRON, RETICCTPCT in the last 72 hours.  Coagulation profile No results for input(s): INR, PROTIME in the last 168 hours.  No results for input(s): DDIMER in the last 72 hours.  Cardiac Enzymes Recent Labs  Lab 12/06/17 1636  TROPONINI <0.03   ------------------------------------------------------------------------------------------------------------------    Component Value Date/Time   BNP 1,657.7 (H) 12/06/2017 0909    Inpatient Medications  Scheduled Meds: . aspirin  325 mg Oral Daily  . atorvastatin  80 mg Oral QHS  . buPROPion  300 mg Oral Daily  . calcium-vitamin D  1 tablet Oral Daily  . darifenacin  15 mg Oral Daily  . enoxaparin (LOVENOX) injection  40 mg Subcutaneous Q24H  . escitalopram  10 mg Oral Daily  . furosemide  40 mg Intravenous BID  . gabapentin  400 mg Oral TID  . insulin aspart  0-15 Units Subcutaneous TID WC  . insulin aspart  0-5 Units Subcutaneous QHS  . losartan  50 mg Oral Daily  . pneumococcal 23 valent vaccine  0.5 mL Intramuscular Tomorrow-1000  . sodium chloride flush  3 mL Intravenous Q12H  . tamsulosin  0.4 mg Oral Daily   Continuous Infusions: . sodium chloride    . azithromycin    . cefTRIAXone (ROCEPHIN)  IV 1 g (12/07/17 0839)   PRN Meds:.sodium chloride, acetaminophen, bisacodyl, hydrOXYzine, nystatin cream, ondansetron (ZOFRAN) IV, oxyCODONE, perflutren lipid microspheres (DEFINITY) IV suspension, sodium chloride flush  Micro Results Recent Results (from the past 240 hour(s))  C difficile quick scan w PCR reflex     Status: None   Collection Time: 12/06/17 11:46 AM  Result Value Ref Range Status   C Diff antigen NEGATIVE NEGATIVE Final   C Diff toxin NEGATIVE NEGATIVE Final   C Diff interpretation No C. difficile detected.  Final    Radiology  Reports Dg Chest 2 View  Result Date: 12/06/2017 CLINICAL DATA:  Shortness of breath increasing over the past week. History of CHF, prostate malignancy, diabetes, morbid obesity. EXAM: CHEST  2 VIEW COMPARISON:  PA and lateral chest x-ray of June 26, 2017 FINDINGS: The lungs are adequately inflated. The interstitial markings are mildly increased and are more conspicuous than in the past. There are confluent airspace opacities at the right lung base and in the left infrahilar region. There may be a small amount of pleural fluid blunting the right lateral and posterior costophrenic angles. The cardiac silhouette is enlarged. The pulmonary vascularity is mildly prominent though stable. There is calcification in the wall of the thoracic aorta. There is multilevel degenerative disc disease of the thoracic spine. IMPRESSION: Probable acute bilateral pneumonia superimposed upon low-grade CHF. New small right pleural effusion. Thoracic aortic atherosclerosis. Electronically Signed   By: David  Martinique M.D.   On: 12/06/2017 09:55   Dg Hip Unilat W Or Wo Pelvis 2-3 Views Right  Result Date: 12/06/2017 CLINICAL DATA:  Right hip pain after multiple falls. EXAM: DG HIP (WITH OR WITHOUT PELVIS) 2-3V RIGHT COMPARISON:  Left hip x-rays dated September 05, 2010. CT abdomen pelvis dated December 09, 2009. FINDINGS: No acute fracture or malalignment. Severe, end-stage degenerative changes of the right hip, with superior migration of the right femoral head. Mild degenerative changes of the left hip joint. Degenerative changes of the lower lumbar spine. IMPRESSION: Severe right hip osteoarthritis.  No acute osseous abnormality. Electronically Signed   By: Titus Dubin M.D.   On: 12/06/2017 13:43    Time Spent in minutes  30   Jani Gravel M.D on 12/07/2017 at 10:39 AM  Between 7am to 7pm - Pager - [phone]  After 7pm go to [email or web address] - password [facility]  [facility] -  Office  [phone]

## 2017-12-08 ENCOUNTER — Other Ambulatory Visit (HOSPITAL_COMMUNITY): Payer: Medicare Other

## 2017-12-08 DIAGNOSIS — N179 Acute kidney failure, unspecified: Secondary | ICD-10-CM

## 2017-12-08 LAB — COMPREHENSIVE METABOLIC PANEL
ALBUMIN: 2.5 g/dL — AB (ref 3.5–5.0)
ALK PHOS: 92 U/L (ref 38–126)
ALT: 10 U/L — ABNORMAL LOW (ref 17–63)
ANION GAP: 9 (ref 5–15)
AST: 12 U/L — ABNORMAL LOW (ref 15–41)
BILIRUBIN TOTAL: 0.6 mg/dL (ref 0.3–1.2)
BUN: 30 mg/dL — ABNORMAL HIGH (ref 6–20)
CALCIUM: 8.1 mg/dL — AB (ref 8.9–10.3)
CO2: 25 mmol/L (ref 22–32)
Chloride: 104 mmol/L (ref 101–111)
Creatinine, Ser: 1.87 mg/dL — ABNORMAL HIGH (ref 0.61–1.24)
GFR calc non Af Amer: 34 mL/min — ABNORMAL LOW (ref >60)
GFR, EST AFRICAN AMERICAN: 39 mL/min — AB (ref >60)
Glucose, Bld: 136 mg/dL — ABNORMAL HIGH (ref 65–99)
POTASSIUM: 4.4 mmol/L (ref 3.5–5.1)
Sodium: 138 mmol/L (ref 135–145)
TOTAL PROTEIN: 6.4 g/dL — AB (ref 6.5–8.1)

## 2017-12-08 LAB — CBC
HCT: 26.9 % — ABNORMAL LOW (ref 39.0–52.0)
Hemoglobin: 8.5 g/dL — ABNORMAL LOW (ref 13.0–17.0)
MCH: 29.3 pg (ref 26.0–34.0)
MCHC: 31.6 g/dL (ref 30.0–36.0)
MCV: 92.8 fL (ref 78.0–100.0)
Platelets: 280 K/uL (ref 150–400)
RBC: 2.9 MIL/uL — ABNORMAL LOW (ref 4.22–5.81)
RDW: 14.3 % (ref 11.5–15.5)
WBC: 5.6 K/uL (ref 4.0–10.5)

## 2017-12-08 LAB — GLUCOSE, CAPILLARY
GLUCOSE-CAPILLARY: 163 mg/dL — AB (ref 65–99)
GLUCOSE-CAPILLARY: 135 mg/dL — AB (ref 65–99)
Glucose-Capillary: 119 mg/dL — ABNORMAL HIGH (ref 65–99)
Glucose-Capillary: 192 mg/dL — ABNORMAL HIGH (ref 65–99)

## 2017-12-08 MED ORDER — SODIUM CHLORIDE 0.9 % IV SOLN
INTRAVENOUS | Status: AC
  Administered 2017-12-08: 21:00:00 via INTRAVENOUS

## 2017-12-08 MED ORDER — PRO-STAT SUGAR FREE PO LIQD
30.0000 mL | Freq: Two times a day (BID) | ORAL | Status: DC
  Administered 2017-12-08 – 2017-12-11 (×8): 30 mL via ORAL
  Filled 2017-12-08 (×9): qty 30

## 2017-12-08 NOTE — Progress Notes (Signed)
Pt refusing to transport to [place]. Asked transport to come back in one hour

## 2017-12-08 NOTE — Plan of Care (Signed)
  Clinical Measurements: Ability to maintain clinical measurements within normal limits will improve 12/08/2017 0250 - Progressing by Tristan Schroeder, RN 12/08/2017 0243 - Progressing by Tristan Schroeder, RN   Clinical Measurements: Will remain free from infection 12/08/2017 0250 - Progressing by Tristan Schroeder, RN 12/08/2017 0243 - Progressing by Tristan Schroeder, RN

## 2017-12-08 NOTE — Progress Notes (Signed)
Physical Therapy Treatment Patient Details Name: [name] MRN: [id number] DOB: 1941-04-16 Today's Date: 12/08/2017    History of Present Illness Pt is a 76 y/o male who presents with SOB. He was found to have CAP. PMH significant for venous insufficiency, prostate CA, DM, CHF, CAP.     PT Comments    Pt's wife present throughout session. Pt making steady progress with mobility. He tolerated transfer from bed to chair this session with heavy physical assistance of two. Pt would continue to benefit from skilled physical therapy services at this time while admitted and after d/c to address the below listed limitations in order to improve overall safety and independence with functional mobility.    Follow Up Recommendations  SNF;Supervision/Assistance - 24 hour     Equipment Recommendations  None recommended by PT    Recommendations for Other Services       Precautions / Restrictions Precautions Precautions: Fall Precaution Comments: Transfers only at baseline per family. Restrictions Weight Bearing Restrictions: No    Mobility  Bed Mobility Overal bed mobility: Needs Assistance Bed Mobility: Supine to Sit     Supine to sit: Min guard;HOB elevated     General bed mobility comments: increased time and effort, use of bed rails, close min guard for safety  Transfers Overall transfer level: Needs assistance Equipment used: 2 person hand held assist Transfers: Sit to/from [employer] Sit to Stand: Mod assist;+2 physical assistance   Squat pivot transfers: Max assist;+2 physical assistance     General transfer comment: increased time and effort with bed in an elevated position, heavy assist of two to power into standing and with pivotal movement to chair as pt was unable to move either foot  Ambulation/Gait                 Stairs            Wheelchair Mobility    Modified Rankin (Stroke Patients Only)       Balance Overall  balance assessment: Needs assistance Sitting-balance support: Feet supported Sitting balance-Leahy Scale: Fair Sitting balance - Comments: pt able to sit EOB with supervision    Standing balance support: During functional activity;Bilateral upper extremity supported Standing balance-Leahy Scale: Poor Standing balance comment: mod-max A x2                            Cognition Arousal/Alertness: Awake/alert Behavior During Therapy: WFL for tasks assessed/performed Overall Cognitive Status: Impaired/Different from baseline Area of Impairment: Problem solving;Safety/judgement                         Safety/Judgement: Decreased awareness of deficits   Problem Solving: Difficulty sequencing;Requires verbal cues;Requires tactile cues        Exercises      General Comments        Pertinent Vitals/Pain Pain Assessment: Faces Faces Pain Scale: Hurts little more Pain Location: R thigh Pain Descriptors / Indicators: Sore Pain Intervention(s): Monitored during session;Repositioned    Home Living                      Prior Function            PT Goals (current goals can now be found in the care plan section) Acute Rehab PT Goals PT Goal Formulation: Patient unable to participate in goal setting Time For Goal Achievement: 12/21/17 Potential to Achieve Goals: Good  Progress towards PT goals: Progressing toward goals    Frequency    Min 2X/week      PT Plan Current plan remains appropriate    Co-evaluation              AM-PAC PT "6 Clicks" Daily Activity  Outcome Measure  Difficulty turning over in bed (including adjusting bedclothes, sheets and blankets)?: A Lot Difficulty moving from lying on back to sitting on the side of the bed? : A Lot Difficulty sitting down on and standing up from a chair with arms (e.g., wheelchair, bedside commode, etc,.)?: Unable Help needed moving to and from a bed to chair (including a wheelchair)?: A  Lot Help needed walking in hospital room?: Total Help needed climbing 3-5 steps with a railing? : Total 6 Click Score: 9    End of Session Equipment Utilized During Treatment: Gait belt Activity Tolerance: Patient limited by fatigue Patient left: in chair;with call bell/phone within reach;with chair alarm set;with family/visitor present Nurse Communication: Mobility status;Need for lift equipment PT Visit Diagnosis: Muscle weakness (generalized) (M62.81);Other abnormalities of gait and mobility (R26.89)     Time: 4709-6283 PT Time Calculation (min) (ACUTE ONLY): 29 min  Charges:  $Therapeutic Activity: 8-22 mins                    G Codes:       [place], [place], [place] 12/08/2017, 1:29 PM

## 2017-12-08 NOTE — Progress Notes (Signed)
[facility] called stated if pt does not transport to [place] at this time he will not be evaluated till tomorrow. Asked pt if he would go to [place], pt refused and asked if transport could come later tonight. Informed pt of [place] schedule, pt stated he will go tomorrow, he does not want to interrupt the football game on the television. [place] department aware.

## 2017-12-08 NOTE — Progress Notes (Signed)
Pt family requesting to speak to social worker regarding placement at [place] on the unit Informed and she is headed to the room

## 2017-12-08 NOTE — Progress Notes (Signed)
Occupational Therapy Note  OT eval completed.  Recommend SNF level rehab.  Full write up to follow.  Lucille Passy, OTR/L [phone]

## 2017-12-08 NOTE — Clinical Social Work Note (Signed)
Clinical Social Work Assessment  Patient Details  Name: [name] MRN: [id number] Date of Birth: Aug 19, 1941  Date of referral:  12/08/17               Reason for consult:  Facility Placement                Permission sought to share information with:  Facility Sport and exercise psychologist, Family Supports Permission granted to share information::  Yes, Verbal Permission Granted  Name::     Agricultural consultant::  SNFs  Relationship::  Spouse  Contact Information:  [phone]  Housing/Transportation Living arrangements for the past 2 months:  [place] of Information:  Patient, Spouse Patient Interpreter Needed:  None Criminal Activity/Legal Involvement Pertinent to Current Situation/Hospitalization:  No - Comment as needed Significant Relationships:  Spouse Lives with:  Spouse Do you feel safe going back to the place where you live?  No Need for family participation in patient care:  Yes (Comment)  Care giving concerns:  CSW received consult for possible SNF placement at time of discharge. CSW spoke with patient and his wife at bedside regarding PT recommendation of SNF placement at time of discharge. Patient reported that patient's spouse is currently unable to care for patient at their home given patient's current physical needs and fall risk. Patient expressed understanding of PT recommendation and is agreeable to SNF placement at time of discharge. CSW to continue to follow and assist with discharge planning needs.   Social Worker assessment / plan:  CSW spoke with patient and his wife concerning possibility of rehab at [facility] before returning home.  Employment status:  Retired Forensic scientist:  Medicare PT Recommendations:  [place] / Referral to community resources:  [place]  Patient/Family's Response to care:  Patient recognizes need for rehab before returning home and is agreeable to a SNF in [place].  Patient's wife reported preference for [facility] since she was just there and loved it. She is hoping to schedule testing for her own medical issue while patient is at rehab and this is important to patient as well.   Patient/Family's Understanding of and Emotional Response to Diagnosis, Current Treatment, and Prognosis:  Patient/family is realistic regarding therapy needs and expressed being hopeful for SNF placement. Patient expressed understanding of CSW role and discharge process as well as medical condition. No questions/concerns about plan or treatment.    Emotional Assessment Appearance:  Appears stated age Attitude/Demeanor/Rapport:  Other(Appropriate) Affect (typically observed):  Accepting, Appropriate, Pleasant Orientation:  Oriented to Self, Oriented to Situation, Oriented to Place, Oriented to  Time Alcohol / Substance use:  Not Applicable Psych involvement (Current and /or in the community):  No (Comment)  Discharge Needs  Concerns to be addressed:  Care Coordination Readmission within the last 30 days:  No Current discharge risk:  None Barriers to Discharge:  Continued Medical Work up   [employer], [place] 12/08/2017, 11:19 AM

## 2017-12-08 NOTE — Plan of Care (Signed)
  Health Behavior/Discharge Planning: Ability to manage health-related needs will improve 12/08/2017 0243 - Progressing by Tristan Schroeder, RN   Clinical Measurements: Will remain free from infection 12/08/2017 0243 - Progressing by Riki Altes T, RN   Activity: Risk for activity intolerance will decrease 12/08/2017 0243 - Progressing by Tristan Schroeder, RN

## 2017-12-08 NOTE — [place] FL2 (Signed)
MEDICAID FL2 LEVEL OF CARE SCREENING TOOL     IDENTIFICATION  Patient Name: [name] Birthdate: [birth year] Sex: male Admission Date (Current Location): 12/06/2017  [facility] and [place] Number:  Herbalist and Address:  [facility] [place] [address] [place], [place] [address]      Provider Number: [id number]  Attending Physician Name and Address:  Jani Gravel, MD  Relative Name and Phone Number:  Shauna Hugh, spouse, [phone]    Current Level of Care: Hospital Recommended Level of Care: [place] Prior Approval Number:    Date Approved/Denied:   PASRR Number: [id number]  Discharge Plan: SNF    Current Diagnoses: Patient Active Problem List   Diagnosis Date Noted  . CAP (community acquired pneumonia) 12/06/2017  . Dyspnea 12/06/2017  . Acute heart failure ([place]) 12/06/2017  . Pleural effusion on right 12/06/2017  . Diabetes ([place])   . WOUND, LEG 01/01/2011  . [place] SHOULDER REGION 11/23/2010  . Decubitus ulcer of heel 11/16/2010  . ADENOCARCINOMA, PROSTATE 09/13/2010  . DIABETES MELLITUS, TYPE II, ON INSULIN 09/13/2010  . HYPERLIPIDEMIA 09/13/2010  . OBESITY, MORBID 09/13/2010  . ANXIETY DEPRESSION 09/13/2010  . HYPERTENSION, BENIGN ESSENTIAL 09/13/2010  . VENOUS STASIS ULCER 09/13/2010  . Venous (peripheral) insufficiency 09/13/2010  . DEGENERATIVE JOINT DISEASE, HIPS 09/13/2010  . BACK PAIN, CHRONIC 09/13/2010  . GAIT DISTURBANCE 09/13/2010  . INCONTINENCE, URGE 09/13/2010  . HIP REPLACEMENT, BILATERAL, HX OF 09/13/2010    Orientation RESPIRATION BLADDER Height & Weight     Self, Time, Situation, Place  Normal Continent, External catheter Weight: 117.1 kg (258 lb 2.5 oz) Height:  6\' 2"  (188 cm)  BEHAVIORAL SYMPTOMS/MOOD NEUROLOGICAL BOWEL NUTRITION STATUS      Continent Diet(Please see DC Summary)  AMBULATORY STATUS COMMUNICATION OF NEEDS Skin   Extensive Assist  Verbally PU Stage and Appropriate Care(Stage I on buttocks)                       Personal Care Assistance Level of Assistance  Bathing, Feeding, Dressing Bathing Assistance: Maximum assistance Feeding assistance: Independent Dressing Assistance: Limited assistance     Functional Limitations Info  Sight Sight Info: Impaired        SPECIAL CARE FACTORS FREQUENCY  PT (By licensed PT), OT (By licensed OT)     PT Frequency: 5x/week OT Frequency: 3x/week            Contractures      Additional Factors Info  Code Status, Allergies, Psychotropic, Insulin Sliding Scale Code Status Info: Full Allergies Info: NKA Psychotropic Info: Wellbutrin Insulin Sliding Scale Info: 3x daily with meals and at bedtime       Current Medications (12/08/2017):  This is the current hospital active medication list Current Facility-Administered Medications  Medication Dose Route Frequency Provider Last Rate Last Dose  . 0.9 %  sodium chloride infusion  250 mL Intravenous PRN Radene Gunning, NP      . acetaminophen (TYLENOL) tablet 650 mg  650 mg Oral Q4H PRN Radene Gunning, NP      . aspirin tablet 325 mg  325 mg Oral Daily Radene Gunning, NP   325 mg at 12/08/17 0842  . atorvastatin (LIPITOR) tablet 80 mg  80 mg Oral QHS Radene Gunning, NP   80 mg at 12/07/17 2230  . azithromycin (ZITHROMAX) 500 mg in dextrose 5 % 250 mL IVPB  500 mg Intravenous Q24H Black,  Lezlie Octave, NP   Stopped at 12/07/17 1233  . bisacodyl (DULCOLAX) suppository 10 mg  10 mg Rectal PRN Radene Gunning, NP      . buPROPion (WELLBUTRIN XL) 24 hr tablet 300 mg  300 mg Oral Daily Radene Gunning, NP   300 mg at 12/08/17 0844  . calcium-vitamin D (OSCAL WITH D) 500-200 MG-UNIT per tablet 1 tablet  1 tablet Oral Daily Radene Gunning, NP   1 tablet at 12/08/17 [phone]  . cefTRIAXone (ROCEPHIN) 1 g in dextrose 5 % 50 mL IVPB  1 g Intravenous Q24H Radene Gunning, NP   Stopped at 12/08/17 [phone]  . darifenacin (ENABLEX) 24 hr tablet 15 mg   15 mg Oral Daily Radene Gunning, NP   15 mg at 12/08/17 7209  . enoxaparin (LOVENOX) injection 40 mg  40 mg Subcutaneous Q24H Radene Gunning, NP   40 mg at 12/07/17 2230  . escitalopram (LEXAPRO) tablet 10 mg  10 mg Oral Daily Radene Gunning, NP   10 mg at 12/08/17 0841  . feeding supplement (PRO-STAT SUGAR FREE 64) liquid 30 mL  30 mL Oral BID Jani Gravel, MD      . gabapentin (NEURONTIN) capsule 400 mg  400 mg Oral TID Radene Gunning, NP   400 mg at 12/08/17 4709  . hydrOXYzine (ATARAX/VISTARIL) tablet 25 mg  25 mg Oral Q6H PRN Radene Gunning, NP      . insulin aspart (novoLOG) injection 0-15 Units  0-15 Units Subcutaneous TID WC Radene Gunning, NP   2 Units at 12/07/17 1645  . insulin aspart (novoLOG) injection 0-5 Units  0-5 Units Subcutaneous QHS Black, Karen M, NP      . losartan (COZAAR) tablet 50 mg  50 mg Oral Daily Radene Gunning, NP   50 mg at 12/08/17 0842  . nystatin cream (MYCOSTATIN)   Topical BID PRN Radene Gunning, NP      . ondansetron [facility]) injection 4 mg  4 mg Intravenous Q6H PRN Radene Gunning, NP      . oxyCODONE (Oxy IR/ROXICODONE) immediate release tablet 5 mg  5 mg Oral Q6H PRN Radene Gunning, NP      . pneumococcal 23 valent vaccine (PNU-IMMUNE) injection 0.5 mL  0.5 mL Intramuscular Tomorrow-1000 Nettey, Myrlene Broker D, MD      . sodium chloride flush (NS) 0.9 % injection 3 mL  3 mL Intravenous Q12H Radene Gunning, NP   3 mL at 12/08/17 0842  . sodium chloride flush (NS) 0.9 % injection 3 mL  3 mL Intravenous PRN Radene Gunning, NP      . tamsulosin [facility]) capsule 0.4 mg  0.4 mg Oral Daily Radene Gunning, NP   0.4 mg at 12/08/17 6283     Discharge Medications: Please see discharge summary for a list of discharge medications.  Relevant Imaging Results:  Relevant Lab Results:   Additional Information SSN: [place], [place]

## 2017-12-08 NOTE — Progress Notes (Signed)
Patient ID: [name], male   DOB: February 06, 1941, 76 y.o.   MRN: [id number]                                                                PROGRESS NOTE                                                                                                                                                                                                             Patient Demographics:    [name], is a 76 y.o. male, DOB - 07-28-1941, KKX:[id number]  Admit date - 12/06/2017   Admitting Physician Desiree Hane, MD  Outpatient Primary MD for the patient is Kathyrn Lass, MD  LOS - 2  Outpatient Specialists:     Chief Complaint  Patient presents with  . Shortness of Breath       Brief Narrative     76 y.o.malewith medical history significantfor prostate cancer, hypertension, bacteremia, anxiety/depression, venous insufficiency, venous stasis ulcers, diabetes, peripheral neuropathy, since to the emergency Department chief complaint of worsening shortness of breath. Initial evaluation reveals community-acquired pneumonia in the setting of acute heart failure. [facility] are asked to admit  Information is obtained from the patient and his family his at the bedside. Patient reports a gradual worsening shortness of breath over the last several days but the last 24 hours "got a whole lot worse". Associated symptoms include chest congestion without a real cough, orthopnea, decreased oral intake and generalized weakness. He denies fever chills syncope near-syncope, worsening lower extremity edema nausea vomiting diarrhea constipation melena bright red blood per rectum. He denies dysuria hematuria frequency or urgency. The wife does report however that he has become incontinent of stool of late. Family reports patient has not been ambulatory for at least 6 years.His ability to bear weight and pivot has decreased over the last couple of weeks.   ED Course:The emergency department he is  afebrile hemodynamically stable with a blood pressure high end of normal tachypnea not hypoxic. He is provided with 40 mg Lasix IV Rocephin and azithromycin.     Subjective:    [name] today has felt that his breathing improved. Less color to his sputum,  Afebrile over nite.    No headache, No chest pain, No abdominal pain - No  Nausea, No new weakness tingling or numbness, No diarrhea.     Assessment  & Plan :    Principal Problem:   CAP (community acquired pneumonia) Active Problems:   ADENOCARCINOMA, PROSTATE   OBESITY, MORBID   ANXIETY DEPRESSION   HYPERTENSION, BENIGN ESSENTIAL   VENOUS STASIS ULCER   Venous (peripheral) insufficiency   Decubitus ulcer of heel   Dyspnea   Acute heart failure (HCC)   Pleural effusion on right   #1. Shortness of breath. Likely multifactorial specifically community-acquired pneumonia in the setting of acute heart failure likely diastolic. Patient presents with tachypnea. Chest x-ray reveals probable acute bilateral pneumonia superimposed upon CHF as well as new small right pleural effusion. Oxygen saturation level greater than 90% on room air. EKG as noted above. Lactic acid within the limits of normal afebrile no leukocytosis nontoxic appearing -Influenza panel negative -Azithromycin and Rocephin -Nebulizers -Lasix 40 mg IV twice a day=> lasix 20mg  po qday -Obtain a 2-D echo pending  #2. Community-acquired pneumonia. -See #1 -antibiotics per protocol  #3. Acute heart failure.Echo done in May of this year reveals an EF of 60% moderate LVH and grade 1 diastolic dysfunction.Home medications include Cozaar and Demadex.BNP greater than 1600. Chest x-ray as noted above.Marland KitchenHe is provided with 40 mg of Lasix intravenously in the emergency department -Continue Lasix 40 mg IV twice a day=> lasix 20mg  po qday(12/28), Stop lasix 12/29 due to renal insufficiency) -Monitor intake and output -Daily weights -Continue home meds -2-D  echo  #4. Diabetes. Home medications include insulin. Serum glucose 187 on admission. -Obtain hemoglobin A1c -Hold his home 70/30 as his appetite unreliable -Sliding scale insulin for optimal control -Monitor  #5. Acute kidney injury. Creatinine 1.39 on admission. Likely a chronic component however [facility] -Hold nephrotoxins -Monitor urine output -check renal ultrasound - hydrate gently w ns iv Check cmp in am   #6. Chronic venous stasis ulcers bilateral lower extremity edema. Large ulcer on left heel. Patient reports Unna boot recently removed. They look stable at baseline -Wound consult -Physical therapy  7. Hypertension. Controlled in the emergency department. -Continue home meds -Lasix as noted above  #8.obesity. bmi 32.3 -nutritional concert   DVT prophylaxis:lovenox Code Status:full Family Communication:  w patient Disposition Plan:likely needs snf Consults called:none Admission status:inpatient         Lab Results  Component Value Date   PLT 280 12/08/2017    Antibiotics  : azithromycin, rocephin 12/28=>   Anti-infectives (From admission, onward)   Start     Dose/Rate Route Frequency Ordered Stop   12/07/17 1100  azithromycin (ZITHROMAX) 500 mg in dextrose 5 % 250 mL IVPB     500 mg 250 mL/hr over 60 Minutes Intravenous Every 24 hours 12/06/17 1143 12/13/17 1059   12/07/17 1000  cefTRIAXone (ROCEPHIN) 1 g in dextrose 5 % 50 mL IVPB     1 g 100 mL/hr over 30 Minutes Intravenous Every 24 hours 12/06/17 1143 12/13/17 0959   12/06/17 1015  cefTRIAXone (ROCEPHIN) 1 g in dextrose 5 % 50 mL IVPB     1 g 100 mL/hr over 30 Minutes Intravenous  Once 12/06/17 1004 12/06/17 1117   12/06/17 1015  azithromycin (ZITHROMAX) 500 mg in dextrose 5 % 250 mL IVPB     500 mg 250 mL/hr over 60 Minutes Intravenous  Once 12/06/17 1004 12/06/17 1229        Objective:   Vitals:   12/07/17 0534  12/07/17 1939 12/08/17 0001 12/08/17 0553  BP: 137/77 (!) 106/46 (!) 110/52 136/69  Pulse: (!) 107 71 66 71  Resp: 16 18 18 18   Temp: (!) 97.5 F (36.4 C) 98.3 F (36.8 C)  98 F (36.7 C)  TempSrc: Oral Oral  Oral  SpO2: 96% 93% 94% 96%  Weight: 117.5 kg (259 lb 0.7 oz)   117.1 kg (258 lb 2.5 oz)  Height:        Wt Readings from Last 3 Encounters:  12/08/17 117.1 kg (258 lb 2.5 oz)  01/12/11 (!) 133.8 kg (295 lb)  12/20/10 (!) 143.8 kg (317 lb)     Intake/Output Summary (Last 24 hours) at 12/08/2017 1024 Last data filed at 12/08/2017 0500 Gross per 24 hour  Intake 1000 ml  Output 1650 ml  Net -650 ml     Physical Exam  Awake Alert, Oriented X 3, No new F.N deficits, Normal affect [place].AT,PERRAL Supple Neck,No JVD, No cervical lymphadenopathy appriciated.  Symmetrical Chest wall movement, Good air movement bilaterally, CTAB RRR,No Gallops,Rubs or new Murmurs, No Parasternal Heave +ve B.Sounds, Abd Soft, No tenderness, No organomegaly appriciated, No rebound - guarding or rigidity. No Cyanosis, Clubbing or edema, No new Rash or bruise     Data Review:    CBC Recent Labs  Lab 12/06/17 0926 12/08/17 0426  WBC 6.6 5.6  HGB 9.3* 8.5*  HCT 29.1* 26.9*  PLT 274 280  MCV 91.5 92.8  MCH 29.2 29.3  MCHC 32.0 31.6  RDW 13.8 14.3  LYMPHSABS 0.9  --   MONOABS 0.5  --   EOSABS 0.3  --   BASOSABS 0.0  --     Chemistries  Recent Labs  Lab 12/06/17 0926 12/07/17 0428 12/08/17 0426  NA 140 139 138  K 3.9 4.3 4.4  CL 106 107 104  CO2 25 24 25   GLUCOSE 189* 177* 136*  BUN 20 27* 30*  CREATININE 1.39* 1.70* 1.87*  CALCIUM 8.4* 8.2* 8.1*  MG 1.9  --   --   AST  --   --  12*  ALT  --   --  10*  ALKPHOS  --   --  92  BILITOT  --   --  0.6   ------------------------------------------------------------------------------------------------------------------ No results for input(s): CHOL, HDL, LDLCALC, TRIG, CHOLHDL, LDLDIRECT in the last 72 hours.  Lab Results   Component Value Date   HGBA1C 6.8 (H) 12/06/2017   ------------------------------------------------------------------------------------------------------------------ No results for input(s): TSH, T4TOTAL, T3FREE, THYROIDAB in the last 72 hours.  Invalid input(s): FREET3 ------------------------------------------------------------------------------------------------------------------ No results for input(s): VITAMINB12, FOLATE, FERRITIN, TIBC, IRON, RETICCTPCT in the last 72 hours.  Coagulation profile No results for input(s): INR, PROTIME in the last 168 hours.  No results for input(s): DDIMER in the last 72 hours.  Cardiac Enzymes Recent Labs  Lab 12/06/17 1636  TROPONINI <0.03   ------------------------------------------------------------------------------------------------------------------    Component Value Date/Time   BNP 1,657.7 (H) 12/06/2017 0909    Inpatient Medications  Scheduled Meds: . aspirin  325 mg Oral Daily  . atorvastatin  80 mg Oral QHS  . buPROPion  300 mg Oral Daily  . calcium-vitamin D  1 tablet Oral Daily  . darifenacin  15 mg Oral Daily  . enoxaparin (LOVENOX) injection  40 mg Subcutaneous Q24H  . escitalopram  10 mg Oral Daily  . furosemide  20 mg Oral Daily  . gabapentin  400 mg Oral TID  . insulin aspart  0-15 Units Subcutaneous TID WC  .  insulin aspart  0-5 Units Subcutaneous QHS  . losartan  50 mg Oral Daily  . pneumococcal 23 valent vaccine  0.5 mL Intramuscular Tomorrow-1000  . sodium chloride flush  3 mL Intravenous Q12H  . tamsulosin  0.4 mg Oral Daily   Continuous Infusions: . sodium chloride    . azithromycin Stopped (12/07/17 1233)  . cefTRIAXone (ROCEPHIN)  IV Stopped (12/08/17 0934)   PRN Meds:.sodium chloride, acetaminophen, bisacodyl, hydrOXYzine, nystatin cream, ondansetron (ZOFRAN) IV, oxyCODONE, sodium chloride flush  Micro Results Recent Results (from the past 240 hour(s))  Blood culture (routine x 2)     Status:  None (Preliminary result)   Collection Time: 12/06/17 10:42 AM  Result Value Ref Range Status   Specimen Description BLOOD LEFT ARM  Final   Special Requests   Final    BOTTLES DRAWN AEROBIC AND ANAEROBIC Blood Culture adequate volume   Culture NO GROWTH 1 DAY  Final   Report Status PENDING  Incomplete  Blood culture (routine x 2)     Status: None (Preliminary result)   Collection Time: 12/06/17 10:45 AM  Result Value Ref Range Status   Specimen Description BLOOD RIGHT ARM  Final   Special Requests   Final    BOTTLES DRAWN AEROBIC AND ANAEROBIC Blood Culture adequate volume   Culture NO GROWTH 1 DAY  Final   Report Status PENDING  Incomplete  Urine culture     Status: None   Collection Time: 12/06/17 11:46 AM  Result Value Ref Range Status   Specimen Description URINE, RANDOM  Final   Special Requests NONE  Final   Culture NO GROWTH  Final   Report Status 12/07/2017 FINAL  Final  C difficile quick scan w PCR reflex     Status: None   Collection Time: 12/06/17 11:46 AM  Result Value Ref Range Status   C Diff antigen NEGATIVE NEGATIVE Final   C Diff toxin NEGATIVE NEGATIVE Final   C Diff interpretation No C. difficile detected.  Final  Gastrointestinal Panel by PCR , Stool     Status: None   Collection Time: 12/06/17 11:46 AM  Result Value Ref Range Status   Campylobacter species NOT DETECTED NOT DETECTED Final   Plesimonas shigelloides NOT DETECTED NOT DETECTED Final   Salmonella species NOT DETECTED NOT DETECTED Final   Yersinia enterocolitica NOT DETECTED NOT DETECTED Final   Vibrio species NOT DETECTED NOT DETECTED Final   Vibrio cholerae NOT DETECTED NOT DETECTED Final   Enteroaggregative E coli (EAEC) NOT DETECTED NOT DETECTED Final   Enteropathogenic E coli (EPEC) NOT DETECTED NOT DETECTED Final   Enterotoxigenic E coli (ETEC) NOT DETECTED NOT DETECTED Final   Shiga like toxin producing E coli (STEC) NOT DETECTED NOT DETECTED Final   Shigella/Enteroinvasive E coli  (EIEC) NOT DETECTED NOT DETECTED Final   Cryptosporidium NOT DETECTED NOT DETECTED Final   Cyclospora cayetanensis NOT DETECTED NOT DETECTED Final   Entamoeba histolytica NOT DETECTED NOT DETECTED Final   Giardia lamblia NOT DETECTED NOT DETECTED Final   Adenovirus F40/41 NOT DETECTED NOT DETECTED Final   Astrovirus NOT DETECTED NOT DETECTED Final   Norovirus GI/GII NOT DETECTED NOT DETECTED Final   Rotavirus A NOT DETECTED NOT DETECTED Final   Sapovirus (I, II, IV, and V) NOT DETECTED NOT DETECTED Final    Comment: Performed at [facility], [address], [place], [place] [address]    Radiology Reports Dg Chest 2 View  Result Date: 12/06/2017 CLINICAL DATA:  Shortness of  breath increasing over the past week. History of CHF, prostate malignancy, diabetes, morbid obesity. EXAM: CHEST  2 VIEW COMPARISON:  PA and lateral chest x-ray of June 26, 2017 FINDINGS: The lungs are adequately inflated. The interstitial markings are mildly increased and are more conspicuous than in the past. There are confluent airspace opacities at the right lung base and in the left infrahilar region. There may be a small amount of pleural fluid blunting the right lateral and posterior costophrenic angles. The cardiac silhouette is enlarged. The pulmonary vascularity is mildly prominent though stable. There is calcification in the wall of the thoracic aorta. There is multilevel degenerative disc disease of the thoracic spine. IMPRESSION: Probable acute bilateral pneumonia superimposed upon low-grade CHF. New small right pleural effusion. Thoracic aortic atherosclerosis. Electronically Signed   By: David  Martinique M.D.   On: 12/06/2017 09:55   Dg Hip Unilat W Or Wo Pelvis 2-3 Views Right  Result Date: 12/06/2017 CLINICAL DATA:  Right hip pain after multiple falls. EXAM: DG HIP (WITH OR WITHOUT PELVIS) 2-3V RIGHT COMPARISON:  Left hip x-rays dated September 05, 2010. CT abdomen pelvis dated December 09, 2009.  FINDINGS: No acute fracture or malalignment. Severe, end-stage degenerative changes of the right hip, with superior migration of the right femoral head. Mild degenerative changes of the left hip joint. Degenerative changes of the lower lumbar spine. IMPRESSION: Severe right hip osteoarthritis.  No acute osseous abnormality. Electronically Signed   By: Titus Dubin M.D.   On: 12/06/2017 13:43    Time Spent in minutes  30   Jani Gravel M.D on 12/08/2017 at 10:24 AM  Between 7am to 7pm - Pager - [phone]    After 7pm go to [email or web address] - password [facility]  [facility] -  Office  [phone]

## 2017-12-09 ENCOUNTER — Inpatient Hospital Stay (HOSPITAL_COMMUNITY): Payer: Medicare Other

## 2017-12-09 LAB — GLUCOSE, CAPILLARY
Glucose-Capillary: 110 mg/dL — ABNORMAL HIGH (ref 65–99)
Glucose-Capillary: 147 mg/dL — ABNORMAL HIGH (ref 65–99)
Glucose-Capillary: 116 mg/dL — ABNORMAL HIGH (ref 65–99)
Glucose-Capillary: 161 mg/dL — ABNORMAL HIGH (ref 65–99)

## 2017-12-09 LAB — CBC
HCT: 28.8 % — ABNORMAL LOW (ref 39.0–52.0)
HEMOGLOBIN: 9.1 g/dL — AB (ref 13.0–17.0)
MCH: 29.4 pg (ref 26.0–34.0)
MCHC: 31.6 g/dL (ref 30.0–36.0)
MCV: 92.9 fL (ref 78.0–100.0)
Platelets: 291 10*3/uL (ref 150–400)
RBC: 3.1 MIL/uL — ABNORMAL LOW (ref 4.22–5.81)
RDW: 14.4 % (ref 11.5–15.5)
WBC: 5.6 10*3/uL (ref 4.0–10.5)

## 2017-12-09 LAB — COMPREHENSIVE METABOLIC PANEL
ALK PHOS: 95 U/L (ref 38–126)
ALT: 9 U/L — ABNORMAL LOW (ref 17–63)
ANION GAP: 8 (ref 5–15)
AST: 14 U/L — ABNORMAL LOW (ref 15–41)
Albumin: 2.7 g/dL — ABNORMAL LOW (ref 3.5–5.0)
BILIRUBIN TOTAL: 0.4 mg/dL (ref 0.3–1.2)
BUN: 36 mg/dL — ABNORMAL HIGH (ref 6–20)
CALCIUM: 8.3 mg/dL — AB (ref 8.9–10.3)
CO2: 26 mmol/L (ref 22–32)
Chloride: 101 mmol/L (ref 101–111)
Creatinine, Ser: 1.87 mg/dL — ABNORMAL HIGH (ref 0.61–1.24)
GFR calc non Af Amer: 34 mL/min — ABNORMAL LOW (ref >60)
GFR, EST AFRICAN AMERICAN: 39 mL/min — AB (ref >60)
Glucose, Bld: 136 mg/dL — ABNORMAL HIGH (ref 65–99)
Potassium: 4.3 mmol/L (ref 3.5–5.1)
SODIUM: 135 mmol/L (ref 135–145)
TOTAL PROTEIN: 7.2 g/dL (ref 6.5–8.1)

## 2017-12-09 MED ORDER — AZITHROMYCIN 500 MG PO TABS
500.0000 mg | ORAL_TABLET | Freq: Every day | ORAL | Status: DC
  Administered 2017-12-10 – 2017-12-11 (×2): 500 mg via ORAL
  Filled 2017-12-09 (×2): qty 1

## 2017-12-09 NOTE — Plan of Care (Signed)
  Health Behavior/Discharge Planning: Ability to manage health-related needs will improve 12/09/2017 0440 - Progressing by Tristan Schroeder, RN

## 2017-12-09 NOTE — Progress Notes (Signed)
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  Currently, he requires min - total A for ADLs and max A +2 for functional transfers.  He is poorly motivated and requires significant encouragement to participate.    He lives with wife, who recently sustained Hip fx and uses a SPC for mobility, therefore, she is limited in her ability to assist him.  He will need SNF level rehab at discharge.     12/08/17 1400  OT Visit Information  Last OT Received On 12/09/17  Assistance Needed +2  PT/OT/SLP Co-Evaluation/Treatment Yes  Reason for Co-Treatment Complexity of the patient's impairments (multi-system involvement);For patient/therapist safety  History of Present Illness Pt is a 76 y/o male who presents with SOB. He was found to have CAP. PMH significant for venous insufficiency, prostate CA, DM, CHF, CAP.   Precautions  Precautions Fall  Precaution Comments Transfers only at baseline per family.  Restrictions  Weight Bearing Restrictions No  Home Living  Family/patient expects to be discharged to: [place] other  Available Help at Discharge Family;Available 24 hours/day  Type of [place] - 2 wheels;Wheelchair - manual  Additional Comments Wife with limited ability to assit pt   Prior Function  Level of Independence Needs assistance  Gait / Transfers Assistance Needed Pt has only been performing bed to w/c transfers for several years   ADL's / Homemaking Assistance Needed Pt has been sponge bathing, and not able to get in the shower per son  Communication  Communication No difficulties  Pain Assessment  Pain Assessment Faces  Faces Pain Scale 4  Pain Location R thigh  Pain Descriptors / Indicators Sore  Pain Intervention(s) Monitored during session  Cognition  Arousal/Alertness Awake/alert  Behavior During Therapy WFL for tasks assessed/performed  Overall Cognitive  Status Impaired/Different from baseline  Area of Impairment Problem solving;Safety/judgement  Safety/Judgement Decreased awareness of deficits  Problem Solving Difficulty sequencing;Requires verbal cues;Requires tactile cues  General Comments anticipate this may be pt's baseline, as wife seemed unconcerned   Upper Extremity Assessment  Upper Extremity Assessment Generalized weakness  Lower Extremity Assessment  Lower Extremity Assessment Defer to PT evaluation  Cervical / Trunk Assessment  Cervical / Trunk Assessment Kyphotic  ADL  Overall ADL's  Needs assistance/impaired  Eating/Feeding Independent  Grooming Wash/dry hands;Wash/dry face;Oral care;Brushing hair;Set up;Sitting  Upper Body Bathing Minimal assistance;Sitting  Lower Body Bathing Maximal assistance;Sit to/from stand  Upper Body Dressing  Minimal assistance;Sitting  Lower Body Dressing Total assistance;Sit to/from Control and instrumentation engineer for physical assistance;Stand-pivot;BSC  Toileting- Clothing Manipulation and Hygiene Total assistance;Sit to/from stand  Functional mobility during ADLs Maximal assistance;+2 for physical assistance  Bed Mobility  Overal bed mobility Needs Assistance  Bed Mobility Supine to Sit  Supine to sit Min guard;HOB elevated  General bed mobility comments increased time and effort, use of bed rails, close min guard for safety  Transfers  Overall transfer level Needs assistance  Equipment used 2 person hand held assist  Transfers Sit to/from [employer] Transfers  Sit to Stand Mod assist;+2 physical assistance  Squat pivot transfers Max assist;+2 physical assistance  General transfer comment increased time and effort with bed in an elevated position, heavy assist of two to power into standing and with pivotal movement to chair as pt was unable to move either foot  Balance  Overall balance assessment Needs assistance  Sitting-balance support Feet supported  Sitting  balance-Leahy Scale  Fair  Sitting balance - Comments pt able to sit EOB with supervision   Standing balance support During functional activity;Bilateral upper extremity supported  Standing balance-Leahy Scale Poor  Standing balance comment mod-max A x2  General Comments  General comments (skin integrity, edema, etc.) discussed with pt the need for SNF level rehab   OT - End of Session  Equipment Utilized During Treatment Gait belt  Activity Tolerance Patient limited by fatigue  Patient left in chair;with call bell/phone within reach;with chair alarm set;with family/visitor present  Nurse Communication Mobility status;Need for lift equipment  OT Assessment  OT Recommendation/Assessment Patient needs continued OT Services  OT Visit Diagnosis Unsteadiness on feet (R26.81);Muscle weakness (generalized) (M62.81)  OT Problem List Decreased strength;Decreased activity tolerance;Impaired balance (sitting and/or standing);Decreased cognition;Decreased safety awareness;Decreased knowledge of use of DME or AE;Pain  Barriers to Discharge Decreased caregiver support  OT Plan  OT Frequency (ACUTE ONLY) Min 2X/week  OT Treatment/Interventions (ACUTE ONLY) Self-care/ADL training;Therapeutic exercise;DME and/or AE instruction;Therapeutic activities;Cognitive remediation/compensation;Patient/family education;Balance training  AM-PAC OT "6 Clicks" Daily Activity Outcome Measure  Help from another person eating meals? 4  Help from another person taking care of personal grooming? 3  Help from another person toileting, which includes using toliet, bedpan, or urinal? 2  Help from another person bathing (including washing, rinsing, drying)? 2  Help from another person to put on and taking off regular upper body clothing? 3  Help from another person to put on and taking off regular lower body clothing? 1  6 Click Score 15  ADL G Code Conversion CK  OT Recommendation  Follow Up Recommendations  SNF;Supervision/Assistance - 24 hour  OT Equipment None recommended by OT  Individuals Consulted  Consulted and Agree with Results and Recommendations Patient;Family member/caregiver  Family Member Consulted wife   Acute Rehab OT Goals  Patient Stated Goal To walk again   OT Goal Formulation With patient/family  Time For Goal Achievement 12/23/17  Potential to Achieve Goals Good  OT Time Calculation  OT Start Time (ACUTE ONLY) 1226  OT Stop Time (ACUTE ONLY) 1255  OT Time Calculation (min) 29 min  OT General Charges  $OT Visit 1 Visit  OT Evaluation  $OT Eval Moderate Complexity 1 Mod   Jaquia Benedicto [place], OTR/L [phone]

## 2017-12-09 NOTE — Progress Notes (Signed)
Pt family requesting to speak to social work regarding placement  Called Velva Harman informed of update  Pt family and pt understanding

## 2017-12-09 NOTE — Progress Notes (Signed)
Patient ID: [name], male   DOB: Aug 19, 1941, 76 y.o.   MRN: [id number]                                                                PROGRESS NOTE                                                                                                                                                                                                             Patient Demographics:    [name], is a 76 y.o. male, DOB - Apr 26, 1941, DEY:[id number]  Admit date - 12/06/2017   Admitting Physician Desiree Hane, MD  Outpatient Primary MD for the patient is Kathyrn Lass, MD  LOS - 3  Outpatient Specialists:     Chief Complaint  Patient presents with  . Shortness of Breath       Brief Narrative    76 y.o.malewith medical history significantfor prostate cancer, hypertension, bacteremia, anxiety/depression, venous insufficiency, venous stasis ulcers, diabetes, peripheral neuropathy, since to the emergency Department chief complaint of worsening shortness of breath. Initial evaluation reveals community-acquired pneumonia in the setting of acute heart failure. [facility] are asked to admit  Information is obtained from the patient and his family his at the bedside. Patient reports a gradual worsening shortness of breath over the last several days but the last 24 hours "got a whole lot worse". Associated symptoms include chest congestion without a real cough, orthopnea, decreased oral intake and generalized weakness. He denies fever chills syncope near-syncope, worsening lower extremity edema nausea vomiting diarrhea constipation melena bright red blood per rectum. He denies dysuria hematuria frequency or urgency. The wife does report however that he has become incontinent of stool of late. Family reports patient has not been ambulatory for at least 6 years.His ability to bear weight and pivot has decreased over the last couple of weeks.   ED Course:The emergency department he is  afebrile hemodynamically stable with a blood pressure high end of normal tachypnea not hypoxic. He is provided with 40 mg Lasix IV Rocephin and azithromycin.     Subjective:    [name] today is feeling that his breathing is better. Less color to sputum.  Afebrile overnite.  Renal insufficiency stabilized. Likely due to diuretics on admission, awaiting renal ultrasound   ,  No headache, No chest pain, No abdominal pain - No Nausea, No new weakness tingling or numbness   Assessment  & Plan :    Principal Problem:   CAP (community acquired pneumonia) Active Problems:   ADENOCARCINOMA, PROSTATE   OBESITY, MORBID   ANXIETY DEPRESSION   HYPERTENSION, BENIGN ESSENTIAL   VENOUS STASIS ULCER   Venous (peripheral) insufficiency   Decubitus ulcer of heel   Dyspnea   Acute heart failure (HCC)   Pleural effusion on right   ARF (acute renal failure) ([place])     #1. Shortness of breath. Likely multifactorial specifically community-acquired pneumonia in the setting of acute heart failure likely diastolic. Patient presents with tachypnea. Chest x-ray reveals probable acute bilateral pneumonia superimposed upon CHF as well as new small right pleural effusion. Oxygen saturation level greater than 90% on room air. EKG as noted above. Lactic acid within the limits of normal afebrile no leukocytosis nontoxic appearing -Influenza panelnegative -Azithromycin and Rocephin -Nebulizers -Lasix 40 mg IV twice a day=> lasix 20mg  po qday -Obtain a 2-D echopending  #2. Community-acquired pneumonia. -See #1 -antibiotics per protocol  #3. Acute heart failure.Echo done in May of this year reveals an EF of 60% moderate LVH and grade 1 diastolic dysfunction.Home medications include Cozaar and Demadex.BNP greater than 1600. Chest x-ray as noted above.Marland KitchenHe is provided with 40 mg of Lasix intravenously in the emergency department -Continue Lasix 40 mg IV twice a day=> lasix 20mg  po qday(12/28), Stop  lasix 12/29 due to renal insufficiency) -2-D echo (12/28) => EF 55-60%,  mild aortic stenosis, mild mitral regurgitation, moderate TR,   #4. Diabetes. Home medications include insulin. Serum glucose 187 on admission. -Obtain hemoglobin A1c -Hold his home 70/30 as his appetite unreliable -Sliding scale insulin for optimal control -Monitor  #5. Acute kidney injury/ ARF. Creatinine 1.39 on admission. Likely a chronic component however [facility] -Hold nephrotoxins -Monitor urine output -check renal ultrasound (12/30)=> no hydronephrosis - hydrate gently w ns iv Check cmp in am   #6. Chronic venous stasis ulcers bilateral lower extremity edema. Large ulcer on left heel. Patient reports Unna boot recently removed. They look stable at baseline -Wound consult -Physical therapy  #7. Hypertension, stable  #8.obesity. bmi 32.3    DVT prophylaxis:lovenox Code Status:full Family Communication:  w patient Disposition Plan:likely needs snf, family in favor of [facility] or [place] or [place] called:none Admission status:inpatient        Lab Results  Component Value Date   PLT 291 12/09/2017    Antibiotics  :  Rocephin/zithromax 12/28=>  Anti-infectives (From admission, onward)   Start     Dose/Rate Route Frequency Ordered Stop   12/07/17 1100  azithromycin (ZITHROMAX) 500 mg in dextrose 5 % 250 mL IVPB     500 mg 250 mL/hr over 60 Minutes Intravenous Every 24 hours 12/06/17 1143 12/13/17 1059   12/07/17 1000  cefTRIAXone (ROCEPHIN) 1 g in dextrose 5 % 50 mL IVPB     1 g 100 mL/hr over 30 Minutes Intravenous Every 24 hours 12/06/17 1143 12/13/17 0959   12/06/17 1015  cefTRIAXone (ROCEPHIN) 1 g in dextrose 5 % 50 mL IVPB     1 g 100 mL/hr over 30 Minutes Intravenous  Once 12/06/17 1004 12/06/17 1117   12/06/17 1015  azithromycin (ZITHROMAX) 500 mg in dextrose 5 % 250 mL IVPB     500  mg 250 mL/hr over 60 Minutes Intravenous  Once  12/06/17 1004 12/06/17 1229        Objective:   Vitals:   12/08/17 0553 12/08/17 1200 12/08/17 1916 12/09/17 0458  BP: 136/69 (!) 156/75 (!) 114/52 (!) 164/63  Pulse: 71 78 66 65  Resp: 18 18 18 18   Temp: 98 F (36.7 C) 98 F (36.7 C) 97.7 F (36.5 C) 98 F (36.7 C)  TempSrc: Oral Oral Oral Oral  SpO2: 96% 96% 100% 100%  Weight: 117.1 kg (258 lb 2.5 oz)   118.4 kg (261 lb 0.4 oz)  Height:        Wt Readings from Last 3 Encounters:  12/09/17 118.4 kg (261 lb 0.4 oz)  01/12/11 (!) 133.8 kg (295 lb)  12/20/10 (!) 143.8 kg (317 lb)     Intake/Output Summary (Last 24 hours) at 12/09/2017 1056 Last data filed at 12/09/2017 3716 Gross per 24 hour  Intake 1597.5 ml  Output 2100 ml  Net -502.5 ml     Physical Exam  Awake Alert, Oriented X 3, No new F.N deficits, Normal affect [place].AT,PERRAL Supple Neck,No JVD, No cervical lymphadenopathy appriciated.  Symmetrical Chest wall movement, Good air movement bilaterally, CTAB RRR,1/6 sem rusb +ve B.Sounds, Abd Soft, No tenderness, No organomegaly appriciated, No rebound - guarding or rigidity. No Cyanosis, Clubbing or edema, No new Rash or bruise     Data Review:    CBC Recent Labs  Lab 12/06/17 0926 12/08/17 0426 12/09/17 0436  WBC 6.6 5.6 5.6  HGB 9.3* 8.5* 9.1*  HCT 29.1* 26.9* 28.8*  PLT 274 280 291  MCV 91.5 92.8 92.9  MCH 29.2 29.3 29.4  MCHC 32.0 31.6 31.6  RDW 13.8 14.3 14.4  LYMPHSABS 0.9  --   --   MONOABS 0.5  --   --   EOSABS 0.3  --   --   BASOSABS 0.0  --   --     Chemistries  Recent Labs  Lab 12/06/17 0926 12/07/17 0428 12/08/17 0426 12/09/17 0436  NA 140 139 138 135  K 3.9 4.3 4.4 4.3  CL 106 107 104 101  CO2 25 24 25 26   GLUCOSE 189* 177* 136* 136*  BUN 20 27* 30* 36*  CREATININE 1.39* 1.70* 1.87* 1.87*  CALCIUM 8.4* 8.2* 8.1* 8.3*  MG 1.9  --   --   --   AST  --   --  12* 14*  ALT  --   --  10* 9*  ALKPHOS  --   --  92 95   BILITOT  --   --  0.6 0.4   ------------------------------------------------------------------------------------------------------------------ No results for input(s): CHOL, HDL, LDLCALC, TRIG, CHOLHDL, LDLDIRECT in the last 72 hours.  Lab Results  Component Value Date   HGBA1C 6.8 (H) 12/06/2017   ------------------------------------------------------------------------------------------------------------------ No results for input(s): TSH, T4TOTAL, T3FREE, THYROIDAB in the last 72 hours.  Invalid input(s): FREET3 ------------------------------------------------------------------------------------------------------------------ No results for input(s): VITAMINB12, FOLATE, FERRITIN, TIBC, IRON, RETICCTPCT in the last 72 hours.  Coagulation profile No results for input(s): INR, PROTIME in the last 168 hours.  No results for input(s): DDIMER in the last 72 hours.  Cardiac Enzymes Recent Labs  Lab 12/06/17 1636  TROPONINI <0.03   ------------------------------------------------------------------------------------------------------------------    Component Value Date/Time   BNP 1,657.7 (H) 12/06/2017 0909    Inpatient Medications  Scheduled Meds: . aspirin  325 mg Oral Daily  . atorvastatin  80 mg Oral QHS  . buPROPion  300 mg Oral Daily  . calcium-vitamin D  1 tablet Oral  Daily  . darifenacin  15 mg Oral Daily  . enoxaparin (LOVENOX) injection  40 mg Subcutaneous Q24H  . escitalopram  10 mg Oral Daily  . feeding supplement (PRO-STAT SUGAR FREE 64)  30 mL Oral BID  . gabapentin  400 mg Oral TID  . insulin aspart  0-15 Units Subcutaneous TID WC  . insulin aspart  0-5 Units Subcutaneous QHS  . losartan  50 mg Oral Daily  . pneumococcal 23 valent vaccine  0.5 mL Intramuscular Tomorrow-1000  . sodium chloride flush  3 mL Intravenous Q12H  . tamsulosin  0.4 mg Oral Daily   Continuous Infusions: . sodium chloride    . azithromycin Stopped (12/08/17 1250)  . cefTRIAXone  (ROCEPHIN)  IV Stopped (12/09/17 0937)   PRN Meds:.sodium chloride, acetaminophen, bisacodyl, hydrOXYzine, nystatin cream, ondansetron (ZOFRAN) IV, oxyCODONE, sodium chloride flush  Micro Results Recent Results (from the past 240 hour(s))  Blood culture (routine x 2)     Status: None (Preliminary result)   Collection Time: 12/06/17 10:42 AM  Result Value Ref Range Status   Specimen Description BLOOD LEFT ARM  Final   Special Requests   Final    BOTTLES DRAWN AEROBIC AND ANAEROBIC Blood Culture adequate volume   Culture NO GROWTH 2 DAYS  Final   Report Status PENDING  Incomplete  Blood culture (routine x 2)     Status: None (Preliminary result)   Collection Time: 12/06/17 10:45 AM  Result Value Ref Range Status   Specimen Description BLOOD RIGHT ARM  Final   Special Requests   Final    BOTTLES DRAWN AEROBIC AND ANAEROBIC Blood Culture adequate volume   Culture NO GROWTH 2 DAYS  Final   Report Status PENDING  Incomplete  Urine culture     Status: None   Collection Time: 12/06/17 11:46 AM  Result Value Ref Range Status   Specimen Description URINE, RANDOM  Final   Special Requests NONE  Final   Culture NO GROWTH  Final   Report Status 12/07/2017 FINAL  Final  C difficile quick scan w PCR reflex     Status: None   Collection Time: 12/06/17 11:46 AM  Result Value Ref Range Status   C Diff antigen NEGATIVE NEGATIVE Final   C Diff toxin NEGATIVE NEGATIVE Final   C Diff interpretation No C. difficile detected.  Final  Gastrointestinal Panel by PCR , Stool     Status: None   Collection Time: 12/06/17 11:46 AM  Result Value Ref Range Status   Campylobacter species NOT DETECTED NOT DETECTED Final   Plesimonas shigelloides NOT DETECTED NOT DETECTED Final   Salmonella species NOT DETECTED NOT DETECTED Final   Yersinia enterocolitica NOT DETECTED NOT DETECTED Final   Vibrio species NOT DETECTED NOT DETECTED Final   Vibrio cholerae NOT DETECTED NOT DETECTED Final   Enteroaggregative E  coli (EAEC) NOT DETECTED NOT DETECTED Final   Enteropathogenic E coli (EPEC) NOT DETECTED NOT DETECTED Final   Enterotoxigenic E coli (ETEC) NOT DETECTED NOT DETECTED Final   Shiga like toxin producing E coli (STEC) NOT DETECTED NOT DETECTED Final   Shigella/Enteroinvasive E coli (EIEC) NOT DETECTED NOT DETECTED Final   Cryptosporidium NOT DETECTED NOT DETECTED Final   Cyclospora cayetanensis NOT DETECTED NOT DETECTED Final   Entamoeba histolytica NOT DETECTED NOT DETECTED Final   Giardia lamblia NOT DETECTED NOT DETECTED Final   Adenovirus F40/41 NOT DETECTED NOT DETECTED Final   Astrovirus NOT DETECTED NOT DETECTED Final   Norovirus GI/GII NOT  DETECTED NOT DETECTED Final   Rotavirus A NOT DETECTED NOT DETECTED Final   Sapovirus (I, II, IV, and V) NOT DETECTED NOT DETECTED Final    Comment: Performed at [facility], [address] [place], [place] [address]    Radiology Reports Dg Chest 2 View  Result Date: 12/06/2017 CLINICAL DATA:  Shortness of breath increasing over the past week. History of CHF, prostate malignancy, diabetes, morbid obesity. EXAM: CHEST  2 VIEW COMPARISON:  PA and lateral chest x-ray of June 26, 2017 FINDINGS: The lungs are adequately inflated. The interstitial markings are mildly increased and are more conspicuous than in the past. There are confluent airspace opacities at the right lung base and in the left infrahilar region. There may be a small amount of pleural fluid blunting the right lateral and posterior costophrenic angles. The cardiac silhouette is enlarged. The pulmonary vascularity is mildly prominent though stable. There is calcification in the wall of the thoracic aorta. There is multilevel degenerative disc disease of the thoracic spine. IMPRESSION: Probable acute bilateral pneumonia superimposed upon low-grade CHF. New small right pleural effusion. Thoracic aortic atherosclerosis. Electronically Signed   By: David  Martinique M.D.   On: 12/06/2017  09:55   Dg Hip Unilat W Or Wo Pelvis 2-3 Views Right  Result Date: 12/06/2017 CLINICAL DATA:  Right hip pain after multiple falls. EXAM: DG HIP (WITH OR WITHOUT PELVIS) 2-3V RIGHT COMPARISON:  Left hip x-rays dated September 05, 2010. CT abdomen pelvis dated December 09, 2009. FINDINGS: No acute fracture or malalignment. Severe, end-stage degenerative changes of the right hip, with superior migration of the right femoral head. Mild degenerative changes of the left hip joint. Degenerative changes of the lower lumbar spine. IMPRESSION: Severe right hip osteoarthritis.  No acute osseous abnormality. Electronically Signed   By: Titus Dubin M.D.   On: 12/06/2017 13:43    Time Spent in minutes  30   Jani Gravel M.D on 12/09/2017 at 10:56 AM  Between 7am to 7pm - Pager - [phone]  After 7pm go to [email or web address] - password [facility]  [facility] -  Office  [phone]

## 2017-12-10 DIAGNOSIS — N189 Chronic kidney disease, unspecified: Secondary | ICD-10-CM

## 2017-12-10 DIAGNOSIS — J189 Pneumonia, unspecified organism: Secondary | ICD-10-CM

## 2017-12-10 DIAGNOSIS — I872 Venous insufficiency (chronic) (peripheral): Secondary | ICD-10-CM

## 2017-12-10 DIAGNOSIS — N179 Acute kidney failure, unspecified: Secondary | ICD-10-CM

## 2017-12-10 DIAGNOSIS — I10 Essential (primary) hypertension: Secondary | ICD-10-CM

## 2017-12-10 DIAGNOSIS — I5033 Acute on chronic diastolic (congestive) heart failure: Principal | ICD-10-CM

## 2017-12-10 LAB — GLUCOSE, CAPILLARY
GLUCOSE-CAPILLARY: 110 mg/dL — AB (ref 65–99)
GLUCOSE-CAPILLARY: 138 mg/dL — AB (ref 65–99)
Glucose-Capillary: 106 mg/dL — ABNORMAL HIGH (ref 65–99)
Glucose-Capillary: 164 mg/dL — ABNORMAL HIGH (ref 65–99)

## 2017-12-10 LAB — COMPREHENSIVE METABOLIC PANEL
ALT: 11 U/L — ABNORMAL LOW (ref 17–63)
AST: 14 U/L — AB (ref 15–41)
Albumin: 2.6 g/dL — ABNORMAL LOW (ref 3.5–5.0)
Alkaline Phosphatase: 90 U/L (ref 38–126)
Anion gap: 9 (ref 5–15)
BUN: 38 mg/dL — AB (ref 6–20)
CHLORIDE: 103 mmol/L (ref 101–111)
CO2: 25 mmol/L (ref 22–32)
Calcium: 8.4 mg/dL — ABNORMAL LOW (ref 8.9–10.3)
Creatinine, Ser: 1.8 mg/dL — ABNORMAL HIGH (ref 0.61–1.24)
GFR, EST AFRICAN AMERICAN: 41 mL/min — AB (ref >60)
GFR, EST NON AFRICAN AMERICAN: 35 mL/min — AB (ref >60)
Glucose, Bld: 119 mg/dL — ABNORMAL HIGH (ref 65–99)
POTASSIUM: 4.4 mmol/L (ref 3.5–5.1)
SODIUM: 137 mmol/L (ref 135–145)
Total Bilirubin: 0.5 mg/dL (ref 0.3–1.2)
Total Protein: 6.5 g/dL (ref 6.5–8.1)

## 2017-12-10 LAB — CBC
HCT: 28.1 % — ABNORMAL LOW (ref 39.0–52.0)
Hemoglobin: 8.8 g/dL — ABNORMAL LOW (ref 13.0–17.0)
MCH: 28.9 pg (ref 26.0–34.0)
MCHC: 31.3 g/dL (ref 30.0–36.0)
MCV: 92.4 fL (ref 78.0–100.0)
PLATELETS: 286 10*3/uL (ref 150–400)
RBC: 3.04 MIL/uL — ABNORMAL LOW (ref 4.22–5.81)
RDW: 14.4 % (ref 11.5–15.5)
WBC: 5.3 10*3/uL (ref 4.0–10.5)

## 2017-12-10 NOTE — Progress Notes (Signed)
PROGRESS NOTE   [name]  WNU:[id number]    DOB: 04/08/1941    DOA: 12/06/2017  PCP: Kathyrn Lass, MD   I have briefly reviewed patients previous medical records in [facility].  Brief Narrative:  76 year old male with PMH of chronic diastolic CHF, DM 2, HTN, HLD, chronic venous insufficiency, prostate cancer status post radiation, presented to ED due to worsening dyspnea, orthopnea, dry cough, decreased oral intake and generalized weakness. In ED, BNP over 1600, chest x-ray concerning for CHF along with opacity in right lower lobe base. Admitted for dyspnea secondary to community-acquired pneumonia and acute on chronic diastolic CHF.   Assessment & Plan:   Principal Problem:   CAP (community acquired pneumonia) Active Problems:   ADENOCARCINOMA, PROSTATE   OBESITY, MORBID   ANXIETY DEPRESSION   HYPERTENSION, BENIGN ESSENTIAL   VENOUS STASIS ULCER   Venous (peripheral) insufficiency   Decubitus ulcer of heel   Dyspnea   Acute heart failure (HCC)   Pleural effusion on right   ARF (acute renal failure) ([place])   1. Acute on chronic diastolic CHF: On admission, BNP >1600 and chest x-ray concerning for congestive heart failure. Treated empirically with IV Lasix 40 mg twice daily, then transitioned to 20 mg once daily and stop on 12/29 due to acute kidney injury. Clinically improved. Follow chest x-ray in a.m. TTE 12/28: LVEF 55-60 percent. 2. Community-acquired pneumonia: Blood cultures 2 negative. Continue azithromycin and ceftriaxone. Follow chest x-ray in a.m. 3. Type II DM: Reasonable inpatient control on SSI. Continue. Home 70/30 insulin currently on hold. 4. Acute on stage III chronic kidney disease: Baseline creatinine not known. Last known creatinine 06/16/11:0.82. Presented with creatinine of 1.39 which peaked 1.87. Diuretics held. Creatinine slightly better at 1.8. Renal ultrasound shows no hydronephrosis. Normal bladder. Consider temporarily holding ARB as well if  creatinine does not show significant improvement in a.m. 5. Chronic lower extremity venous stasis: Has wounds on his legs, management per wound care team. Recently had Una bruits removed. 6. Essential hypertension: Controlled. 7. Obesity/Body mass index is 33.23 kg/m. 8. Anemia: May be chronic disease versus chronic kidney disease. Stable. 9. Diarrhea/stool incontinence: C. difficile and GI pathogen panel PCR negative.   DVT prophylaxis: Lovenox Code Status: Full Family Communication: Discussed with spouse at bedside. Disposition: DC to SNF when medically improved   Consultants:  None   Procedures:  None  Antimicrobials:  Ceftriaxone and azithromycin    Subjective: Reports mild dry cough and states that his dyspnea has not significantly changed. However he is lying comfortably supine in bed and on room air. No chest pain, fever, chills, palpitations, dizziness or lightheadedness.   ROS: As above.  Objective:  Vitals:   12/09/17 2001 12/10/17 0458 12/10/17 0900 12/10/17 1200  BP: 115/80 137/68 (!) 150/72 (!) 168/75  Pulse: 72 69 77 67  Resp: 19 20  18   Temp: 98.4 F (36.9 C) 98 F (36.7 C)  97.9 F (36.6 C)  TempSrc: Oral Oral  Oral  SpO2: 95% 96% 98% 98%  Weight:  117.4 kg (258 lb 13.1 oz)    Height:        Examination:  General exam: Pleasant elderly male, moderately built and nourished, lying comfortably supine in bed. Respiratory system: Occasional basal crackles but otherwise clear to auscultation. Respiratory effort normal. Cardiovascular system: S1 & S2 heard, RRR. No JVD, murmurs, rubs, gallops or clicks. Trace pedal edema. Telemetry personally reviewed: Sinus rhythm with first-degree AV block. Gastrointestinal system: Abdomen is nondistended,  soft and nontender. No organomegaly or masses felt. Normal bowel sounds heard. Central nervous system: Alert and oriented. No focal neurological deficits. Extremities: Symmetric 5 x 5 power. Skin: Changes of chronic  lower extremity venous stasis. Psychiatry: Judgement and insight appear normal. Mood & affect appropriate.     Data Reviewed: I have personally reviewed following labs and imaging studies  CBC: Recent Labs  Lab 12/06/17 0926 12/08/17 0426 12/09/17 0436 12/10/17 0552  WBC 6.6 5.6 5.6 5.3  NEUTROABS 4.8  --   --   --   HGB 9.3* 8.5* 9.1* 8.8*  HCT 29.1* 26.9* 28.8* 28.1*  MCV 91.5 92.8 92.9 92.4  PLT 274 280 291 518   Basic Metabolic Panel: Recent Labs  Lab 12/06/17 0926 12/07/17 0428 12/08/17 0426 12/09/17 0436 12/10/17 0552  NA 140 139 138 135 137  K 3.9 4.3 4.4 4.3 4.4  CL 106 107 104 101 103  CO2 25 24 25 26 25   GLUCOSE 189* 177* 136* 136* 119*  BUN 20 27* 30* 36* 38*  CREATININE 1.39* 1.70* 1.87* 1.87* 1.80*  CALCIUM 8.4* 8.2* 8.1* 8.3* 8.4*  MG 1.9  --   --   --   --    Liver Function Tests: Recent Labs  Lab 12/08/17 0426 12/09/17 0436 12/10/17 0552  AST 12* 14* 14*  ALT 10* 9* 11*  ALKPHOS 92 95 90  BILITOT 0.6 0.4 0.5  PROT 6.4* 7.2 6.5  ALBUMIN 2.5* 2.7* 2.6*   Cardiac Enzymes: Recent Labs  Lab 12/06/17 1636  TROPONINI <0.03   HbA1C: No results for input(s): HGBA1C in the last 72 hours. CBG: Recent Labs  Lab 12/09/17 1222 12/09/17 1657 12/09/17 2124 12/10/17 0748 12/10/17 1153  GLUCAP 147* 116* 161* 110* 138*    Recent Results (from the past 240 hour(s))  Blood culture (routine x 2)     Status: None (Preliminary result)   Collection Time: 12/06/17 10:42 AM  Result Value Ref Range Status   Specimen Description BLOOD LEFT ARM  Final   Special Requests   Final    BOTTLES DRAWN AEROBIC AND ANAEROBIC Blood Culture adequate volume   Culture NO GROWTH 4 DAYS  Final   Report Status PENDING  Incomplete  Blood culture (routine x 2)     Status: None (Preliminary result)   Collection Time: 12/06/17 10:45 AM  Result Value Ref Range Status   Specimen Description BLOOD RIGHT ARM  Final   Special Requests   Final    BOTTLES DRAWN AEROBIC AND  ANAEROBIC Blood Culture adequate volume   Culture NO GROWTH 4 DAYS  Final   Report Status PENDING  Incomplete  Urine culture     Status: None   Collection Time: 12/06/17 11:46 AM  Result Value Ref Range Status   Specimen Description URINE, RANDOM  Final   Special Requests NONE  Final   Culture NO GROWTH  Final   Report Status 12/07/2017 FINAL  Final  C difficile quick scan w PCR reflex     Status: None   Collection Time: 12/06/17 11:46 AM  Result Value Ref Range Status   C Diff antigen NEGATIVE NEGATIVE Final   C Diff toxin NEGATIVE NEGATIVE Final   C Diff interpretation No C. difficile detected.  Final  Gastrointestinal Panel by PCR , Stool     Status: None   Collection Time: 12/06/17 11:46 AM  Result Value Ref Range Status   Campylobacter species NOT DETECTED NOT DETECTED Final   Plesimonas shigelloides NOT  DETECTED NOT DETECTED Final   Salmonella species NOT DETECTED NOT DETECTED Final   Yersinia enterocolitica NOT DETECTED NOT DETECTED Final   Vibrio species NOT DETECTED NOT DETECTED Final   Vibrio cholerae NOT DETECTED NOT DETECTED Final   Enteroaggregative E coli (EAEC) NOT DETECTED NOT DETECTED Final   Enteropathogenic E coli (EPEC) NOT DETECTED NOT DETECTED Final   Enterotoxigenic E coli (ETEC) NOT DETECTED NOT DETECTED Final   Shiga like toxin producing E coli (STEC) NOT DETECTED NOT DETECTED Final   Shigella/Enteroinvasive E coli (EIEC) NOT DETECTED NOT DETECTED Final   Cryptosporidium NOT DETECTED NOT DETECTED Final   Cyclospora cayetanensis NOT DETECTED NOT DETECTED Final   Entamoeba histolytica NOT DETECTED NOT DETECTED Final   Giardia lamblia NOT DETECTED NOT DETECTED Final   Adenovirus F40/41 NOT DETECTED NOT DETECTED Final   Astrovirus NOT DETECTED NOT DETECTED Final   Norovirus GI/GII NOT DETECTED NOT DETECTED Final   Rotavirus A NOT DETECTED NOT DETECTED Final   Sapovirus (I, II, IV, and V) NOT DETECTED NOT DETECTED Final    Comment: Performed at [facility], [address], [place], [place] [address]         Radiology Studies: US Renal  Result Date: 12/09/2017 CLINICAL DATA:  Acute renal failure. Inpatient. History prostate cancer. EXAM: RENAL / URINARY TRACT ULTRASOUND COMPLETE COMPARISON:  07/24/2017 renal sonogram FINDINGS: Right Kidney: Length: 10.4 cm. Moderately atrophic echogenic right renal parenchyma. No right hydronephrosis. No right renal mass. Incidental right pleural effusion. Left Kidney: Length: 12.1 cm. Simple 1.4 cm upper left renal cyst. No left hydronephrosis. Mildly atrophic echogenic left renal parenchyma. Bladder: Appears normal for degree of bladder distention. IMPRESSION: 1. No hydronephrosis. 2. Echogenic atrophic kidneys, compatible with acute on chronic nonspecific renal parenchymal disease. 3. Normal bladder. 4. Incidental right pleural effusion. Electronically Signed   By: Ilona Sorrel M.D.   On: 12/09/2017 11:35        Scheduled Meds: . aspirin  325 mg Oral Daily  . atorvastatin  80 mg Oral QHS  . azithromycin  500 mg Oral Daily  . buPROPion  300 mg Oral Daily  . calcium-vitamin D  1 tablet Oral Daily  . darifenacin  15 mg Oral Daily  . enoxaparin (LOVENOX) injection  40 mg Subcutaneous Q24H  . escitalopram  10 mg Oral Daily  . feeding supplement (PRO-STAT SUGAR FREE 64)  30 mL Oral BID  . gabapentin  400 mg Oral TID  . insulin aspart  0-15 Units Subcutaneous TID WC  . insulin aspart  0-5 Units Subcutaneous QHS  . losartan  50 mg Oral Daily  . pneumococcal 23 valent vaccine  0.5 mL Intramuscular Tomorrow-1000  . sodium chloride flush  3 mL Intravenous Q12H  . tamsulosin  0.4 mg Oral Daily   Continuous Infusions: . sodium chloride    . cefTRIAXone (ROCEPHIN)  IV Stopped (12/10/17 1024)     LOS: 4 days     Vernell Leep, MD, FACP, [facility]. [facility] Pager [phone]  If 7PM-7AM, please contact night-coverage [email or web address] Password [facility] 12/10/2017, 4:33 PM

## 2017-12-10 NOTE — Clinical Social Work Note (Addendum)
CSW left voicemail for admissions coordinator at [facility] to follow up on referral.  [name], [place]  10:27 am [facility] can accept patient when stable. Patient and his wife notified.  [name], [place]

## 2017-12-11 ENCOUNTER — Inpatient Hospital Stay (HOSPITAL_COMMUNITY): Payer: Medicare Other

## 2017-12-11 LAB — BASIC METABOLIC PANEL
Anion gap: 8 (ref 5–15)
BUN: 39 mg/dL — ABNORMAL HIGH (ref 6–20)
CHLORIDE: 103 mmol/L (ref 101–111)
CO2: 24 mmol/L (ref 22–32)
Calcium: 8.4 mg/dL — ABNORMAL LOW (ref 8.9–10.3)
Creatinine, Ser: 1.62 mg/dL — ABNORMAL HIGH (ref 0.61–1.24)
GFR, EST AFRICAN AMERICAN: 47 mL/min — AB (ref >60)
GFR, EST NON AFRICAN AMERICAN: 40 mL/min — AB (ref >60)
Glucose, Bld: 115 mg/dL — ABNORMAL HIGH (ref 65–99)
POTASSIUM: 4.4 mmol/L (ref 3.5–5.1)
Sodium: 135 mmol/L (ref 135–145)

## 2017-12-11 LAB — GLUCOSE, CAPILLARY
GLUCOSE-CAPILLARY: 109 mg/dL — AB (ref 65–99)
Glucose-Capillary: 159 mg/dL — ABNORMAL HIGH (ref 65–99)
Glucose-Capillary: 153 mg/dL — ABNORMAL HIGH (ref 65–99)
Glucose-Capillary: 137 mg/dL — ABNORMAL HIGH (ref 65–99)

## 2017-12-11 LAB — CULTURE, BLOOD (ROUTINE X 2)
CULTURE: NO GROWTH
Culture: NO GROWTH
SPECIAL REQUESTS: ADEQUATE
Special Requests: ADEQUATE

## 2017-12-11 MED ORDER — LOPERAMIDE HCL 2 MG PO CAPS
2.0000 mg | ORAL_CAPSULE | Freq: Two times a day (BID) | ORAL | Status: DC | PRN
  Administered 2017-12-12: 2 mg via ORAL
  Filled 2017-12-11: qty 1

## 2017-12-11 MED ORDER — FUROSEMIDE 20 MG PO TABS
20.0000 mg | ORAL_TABLET | Freq: Every day | ORAL | Status: DC
  Administered 2017-12-11 – 2017-12-12 (×2): 20 mg via ORAL
  Filled 2017-12-11 (×2): qty 1

## 2017-12-11 MED ORDER — LOSARTAN POTASSIUM 50 MG PO TABS
50.0000 mg | ORAL_TABLET | Freq: Every day | ORAL | Status: DC
  Administered 2017-12-12: 50 mg via ORAL
  Filled 2017-12-11: qty 1

## 2017-12-11 NOTE — Progress Notes (Signed)
PROGRESS NOTE   [name]  JKD:[id number]    DOB: 05/14/1941    DOA: 12/06/2017  PCP: Kathyrn Lass, MD   I have briefly reviewed patients previous medical records in [facility].  Brief Narrative:  77 year old male with PMH of chronic diastolic CHF, DM 2, HTN, HLD, chronic venous insufficiency, prostate cancer status post radiation, presented to ED due to worsening dyspnea, orthopnea, dry cough, decreased oral intake and generalized weakness. In ED, BNP over 1600, chest x-ray concerning for CHF along with opacity in right lower lobe base. Admitted for dyspnea secondary to community-acquired pneumonia and acute on chronic diastolic CHF.   Assessment & Plan:   Principal Problem:   CAP (community acquired pneumonia) Active Problems:   ADENOCARCINOMA, PROSTATE   OBESITY, MORBID   ANXIETY DEPRESSION   HYPERTENSION, BENIGN ESSENTIAL   VENOUS STASIS ULCER   Venous (peripheral) insufficiency   Decubitus ulcer of heel   Dyspnea   Acute heart failure (HCC)   Pleural effusion on right   ARF (acute renal failure) ([place])   1. Acute on chronic diastolic CHF: On admission, BNP >1600 and chest x-ray concerning for congestive heart failure. Treated empirically with IV Lasix 40 mg twice daily, then transitioned to 20 mg once daily and stop on 12/29 due to acute kidney injury. TTE 12/28: LVEF 55-60 percent. Appears mildly volume overloaded. Chest x-ray shows increase in bibasilar infiltrates. Holding ARB for today due to acute kidney injury. Start Lasix 20 MG daily. 2. Community-acquired pneumonia: Blood cultures 2 negative. Completed 6 days of ceftriaxone and azithromycin, discontinued. Recommend follow-up chest x-ray in 4 weeks. 3. Type II DM: Reasonable inpatient control on SSI. Continue. Home 70/30 insulin currently on hold. 4. Acute on stage III chronic kidney disease: Baseline creatinine not known. Last known creatinine 06/16/11:0.82. Presented with creatinine of 1.39 which peaked 1.87.  Diuretics held. Creatinine has improved to 1.6. Renal ultrasound shows no hydronephrosis. Normal bladder. Held ARB today but started low-dose Lasix. Follow BMP in a.m. 5. Chronic lower extremity venous stasis: Has wounds on his legs, management per wound care team. Recently had Una bruits removed. 6. Essential hypertension: Controlled. 7. Obesity/Body mass index is 33.91 kg/m. 8. Anemia: May be chronic disease versus chronic kidney disease. Stable. 9. Diarrhea/stool incontinence: C. difficile and GI pathogen panel PCR negative. Long-standing complaint for several months. Patient and spouse report that he has never had a colonoscopy. Recommend outpatient GI consultation. Trial of Imodium.   DVT prophylaxis: Lovenox Code Status: Full Family Communication: Discussed with spouse at bedside. Disposition: DC to SNF when medically improved, possibly 12/12/17. Patient reportedly has not been ambulant for 5-6 years and moves around with the help of a wheelchair.   Consultants:  None   Procedures:  None  Antimicrobials:  Ceftriaxone and azithromycin    Subjective: Stool incontinence up to 2-3 times a day for several months. Reports never having colonoscopy. No cough but still has some dyspnea.  ROS: As above.  Objective:  Vitals:   12/10/17 1200 12/10/17 1857 12/10/17 2036 12/11/17 0347  BP: (!) 168/75  (!) 127/56 (!) 160/79  Pulse: 67  67 76  Resp: 18  18 14   Temp: 97.9 F (36.6 C)  97.7 F (36.5 C) 98.2 F (36.8 C)  TempSrc: Oral  Oral Oral  SpO2: 98% 97% 95% 95%  Weight:    119.8 kg (264 lb 1.8 oz)  Height:        Examination:  General exam: Pleasant elderly male, moderately built  and nourished, lying comfortably supine in bed. Respiratory system: Occasional basal crackles but otherwise clear to auscultation. Respiratory effort normal. Unchanged/stable. Cardiovascular system: S1 & S2 heard, RRR. No JVD, murmurs, rubs, gallops or clicks. Trace pedal edema. Telemetry  personally reviewed: Sinus rhythm with first-degree AV block. Unchanged/stable. Gastrointestinal system: Abdomen is nondistended, soft and nontender. No organomegaly or masses felt. Normal bowel sounds heard. Central nervous system: Alert and oriented. No focal neurological deficits. Extremities: Symmetric 5 x 5 power. Skin: Changes of chronic lower extremity venous stasis. Psychiatry: Judgement and insight appear normal. Mood & affect appropriate.     Data Reviewed: I have personally reviewed following labs and imaging studies  CBC: Recent Labs  Lab 12/06/17 0926 12/08/17 0426 12/09/17 0436 12/10/17 0552  WBC 6.6 5.6 5.6 5.3  NEUTROABS 4.8  --   --   --   HGB 9.3* 8.5* 9.1* 8.8*  HCT 29.1* 26.9* 28.8* 28.1*  MCV 91.5 92.8 92.9 92.4  PLT 274 280 291 244   Basic Metabolic Panel: Recent Labs  Lab 12/06/17 0926 12/07/17 0428 12/08/17 0426 12/09/17 0436 12/10/17 0552 12/11/17 0419  NA 140 139 138 135 137 135  K 3.9 4.3 4.4 4.3 4.4 4.4  CL 106 107 104 101 103 103  CO2 25 24 25 26 25 24   GLUCOSE 189* 177* 136* 136* 119* 115*  BUN 20 27* 30* 36* 38* 39*  CREATININE 1.39* 1.70* 1.87* 1.87* 1.80* 1.62*  CALCIUM 8.4* 8.2* 8.1* 8.3* 8.4* 8.4*  MG 1.9  --   --   --   --   --    Liver Function Tests: Recent Labs  Lab 12/08/17 0426 12/09/17 0436 12/10/17 0552  AST 12* 14* 14*  ALT 10* 9* 11*  ALKPHOS 92 95 90  BILITOT 0.6 0.4 0.5  PROT 6.4* 7.2 6.5  ALBUMIN 2.5* 2.7* 2.6*   Cardiac Enzymes: Recent Labs  Lab 12/06/17 1636  TROPONINI <0.03   HbA1C: No results for input(s): HGBA1C in the last 72 hours. CBG: Recent Labs  Lab 12/10/17 1153 12/10/17 1656 12/10/17 2116 12/11/17 0734 12/11/17 1144  GLUCAP 138* 106* 164* 109* 159*    Recent Results (from the past 240 hour(s))  Blood culture (routine x 2)     Status: None (Preliminary result)   Collection Time: 12/06/17 10:42 AM  Result Value Ref Range Status   Specimen Description BLOOD LEFT ARM  Final    Special Requests   Final    BOTTLES DRAWN AEROBIC AND ANAEROBIC Blood Culture adequate volume   Culture NO GROWTH 4 DAYS  Final   Report Status PENDING  Incomplete  Blood culture (routine x 2)     Status: None (Preliminary result)   Collection Time: 12/06/17 10:45 AM  Result Value Ref Range Status   Specimen Description BLOOD RIGHT ARM  Final   Special Requests   Final    BOTTLES DRAWN AEROBIC AND ANAEROBIC Blood Culture adequate volume   Culture NO GROWTH 4 DAYS  Final   Report Status PENDING  Incomplete  Urine culture     Status: None   Collection Time: 12/06/17 11:46 AM  Result Value Ref Range Status   Specimen Description URINE, RANDOM  Final   Special Requests NONE  Final   Culture NO GROWTH  Final   Report Status 12/07/2017 FINAL  Final  C difficile quick scan w PCR reflex     Status: None   Collection Time: 12/06/17 11:46 AM  Result Value Ref Range Status  C Diff antigen NEGATIVE NEGATIVE Final   C Diff toxin NEGATIVE NEGATIVE Final   C Diff interpretation No C. difficile detected.  Final  Gastrointestinal Panel by PCR , Stool     Status: None   Collection Time: 12/06/17 11:46 AM  Result Value Ref Range Status   Campylobacter species NOT DETECTED NOT DETECTED Final   Plesimonas shigelloides NOT DETECTED NOT DETECTED Final   Salmonella species NOT DETECTED NOT DETECTED Final   Yersinia enterocolitica NOT DETECTED NOT DETECTED Final   Vibrio species NOT DETECTED NOT DETECTED Final   Vibrio cholerae NOT DETECTED NOT DETECTED Final   Enteroaggregative E coli (EAEC) NOT DETECTED NOT DETECTED Final   Enteropathogenic E coli (EPEC) NOT DETECTED NOT DETECTED Final   Enterotoxigenic E coli (ETEC) NOT DETECTED NOT DETECTED Final   Shiga like toxin producing E coli (STEC) NOT DETECTED NOT DETECTED Final   Shigella/Enteroinvasive E coli (EIEC) NOT DETECTED NOT DETECTED Final   Cryptosporidium NOT DETECTED NOT DETECTED Final   Cyclospora cayetanensis NOT DETECTED NOT DETECTED Final    Entamoeba histolytica NOT DETECTED NOT DETECTED Final   Giardia lamblia NOT DETECTED NOT DETECTED Final   Adenovirus F40/41 NOT DETECTED NOT DETECTED Final   Astrovirus NOT DETECTED NOT DETECTED Final   Norovirus GI/GII NOT DETECTED NOT DETECTED Final   Rotavirus A NOT DETECTED NOT DETECTED Final   Sapovirus (I, II, IV, and V) NOT DETECTED NOT DETECTED Final    Comment: Performed at [facility], [address] [place], [place] [address]         Radiology Studies: Dg Chest 2 View  Result Date: 12/11/2017 CLINICAL DATA:  Weakness EXAM: CHEST  2 VIEW COMPARISON:  12/06/2017 FINDINGS: Cardiac shadow is enlarged but stable. Bibasilar infiltrates are noted increased from the prior exam. Small right effusion is noted. No bony abnormality is seen. IMPRESSION: Bibasilar infiltrates increased from the prior exam. Electronically Signed   By: Inez Catalina M.D.   On: 12/11/2017 09:21        Scheduled Meds: . aspirin  325 mg Oral Daily  . atorvastatin  80 mg Oral QHS  . azithromycin  500 mg Oral Daily  . buPROPion  300 mg Oral Daily  . calcium-vitamin D  1 tablet Oral Daily  . darifenacin  15 mg Oral Daily  . enoxaparin (LOVENOX) injection  40 mg Subcutaneous Q24H  . escitalopram  10 mg Oral Daily  . feeding supplement (PRO-STAT SUGAR FREE 64)  30 mL Oral BID  . furosemide  20 mg Oral Daily  . gabapentin  400 mg Oral TID  . insulin aspart  0-15 Units Subcutaneous TID WC  . insulin aspart  0-5 Units Subcutaneous QHS  . [START ON 12/12/2017] losartan  50 mg Oral Daily  . pneumococcal 23 valent vaccine  0.5 mL Intramuscular Tomorrow-1000  . sodium chloride flush  3 mL Intravenous Q12H  . tamsulosin  0.4 mg Oral Daily   Continuous Infusions: . sodium chloride    . cefTRIAXone (ROCEPHIN)  IV Stopped (12/11/17 1131)     LOS: 5 days     Vernell Leep, MD, FACP, [facility]. [facility] Pager [phone]  If 7PM-7AM, please contact  night-coverage [email or web address] Password [email or web address] 12/11/2017, 2:15 PM

## 2017-12-12 DIAGNOSIS — C61 Malignant neoplasm of prostate: Secondary | ICD-10-CM | POA: Diagnosis not present

## 2017-12-12 DIAGNOSIS — J181 Lobar pneumonia, unspecified organism: Secondary | ICD-10-CM | POA: Diagnosis not present

## 2017-12-12 DIAGNOSIS — L89890 Pressure ulcer of other site, unstageable: Secondary | ICD-10-CM | POA: Diagnosis not present

## 2017-12-12 DIAGNOSIS — R2689 Other abnormalities of gait and mobility: Secondary | ICD-10-CM | POA: Diagnosis not present

## 2017-12-12 DIAGNOSIS — N183 Chronic kidney disease, stage 3 (moderate): Secondary | ICD-10-CM | POA: Diagnosis not present

## 2017-12-12 DIAGNOSIS — E669 Obesity, unspecified: Secondary | ICD-10-CM | POA: Diagnosis not present

## 2017-12-12 DIAGNOSIS — L89600 Pressure ulcer of unspecified heel, unstageable: Secondary | ICD-10-CM | POA: Diagnosis not present

## 2017-12-12 DIAGNOSIS — N189 Chronic kidney disease, unspecified: Secondary | ICD-10-CM | POA: Diagnosis not present

## 2017-12-12 DIAGNOSIS — I878 Other specified disorders of veins: Secondary | ICD-10-CM | POA: Diagnosis not present

## 2017-12-12 DIAGNOSIS — M25551 Pain in right hip: Secondary | ICD-10-CM | POA: Diagnosis not present

## 2017-12-12 DIAGNOSIS — I5033 Acute on chronic diastolic (congestive) heart failure: Secondary | ICD-10-CM | POA: Diagnosis not present

## 2017-12-12 DIAGNOSIS — E119 Type 2 diabetes mellitus without complications: Secondary | ICD-10-CM | POA: Diagnosis not present

## 2017-12-12 DIAGNOSIS — F418 Other specified anxiety disorders: Secondary | ICD-10-CM | POA: Diagnosis not present

## 2017-12-12 DIAGNOSIS — R197 Diarrhea, unspecified: Secondary | ICD-10-CM | POA: Diagnosis not present

## 2017-12-12 DIAGNOSIS — F329 Major depressive disorder, single episode, unspecified: Secondary | ICD-10-CM | POA: Diagnosis not present

## 2017-12-12 DIAGNOSIS — N179 Acute kidney failure, unspecified: Secondary | ICD-10-CM | POA: Diagnosis not present

## 2017-12-12 DIAGNOSIS — M160 Bilateral primary osteoarthritis of hip: Secondary | ICD-10-CM | POA: Diagnosis not present

## 2017-12-12 DIAGNOSIS — I130 Hypertensive heart and chronic kidney disease with heart failure and stage 1 through stage 4 chronic kidney disease, or unspecified chronic kidney disease: Secondary | ICD-10-CM | POA: Diagnosis not present

## 2017-12-12 DIAGNOSIS — D649 Anemia, unspecified: Secondary | ICD-10-CM | POA: Diagnosis not present

## 2017-12-12 DIAGNOSIS — M25561 Pain in right knee: Secondary | ICD-10-CM | POA: Diagnosis not present

## 2017-12-12 DIAGNOSIS — I5040 Unspecified combined systolic (congestive) and diastolic (congestive) heart failure: Secondary | ICD-10-CM | POA: Diagnosis not present

## 2017-12-12 DIAGNOSIS — J189 Pneumonia, unspecified organism: Secondary | ICD-10-CM | POA: Diagnosis not present

## 2017-12-12 DIAGNOSIS — M150 Primary generalized (osteo)arthritis: Secondary | ICD-10-CM | POA: Diagnosis not present

## 2017-12-12 DIAGNOSIS — F4310 Post-traumatic stress disorder, unspecified: Secondary | ICD-10-CM | POA: Diagnosis not present

## 2017-12-12 LAB — GLUCOSE, CAPILLARY
GLUCOSE-CAPILLARY: 162 mg/dL — AB (ref 65–99)
Glucose-Capillary: 94 mg/dL (ref 65–99)

## 2017-12-12 LAB — BASIC METABOLIC PANEL
ANION GAP: 8 (ref 5–15)
BUN: 37 mg/dL — ABNORMAL HIGH (ref 6–20)
CO2: 28 mmol/L (ref 22–32)
Calcium: 8.7 mg/dL — ABNORMAL LOW (ref 8.9–10.3)
Chloride: 102 mmol/L (ref 101–111)
Creatinine, Ser: 1.68 mg/dL — ABNORMAL HIGH (ref 0.61–1.24)
GFR calc Af Amer: 45 mL/min — ABNORMAL LOW (ref >60)
GFR, EST NON AFRICAN AMERICAN: 38 mL/min — AB (ref >60)
GLUCOSE: 138 mg/dL — AB (ref 65–99)
POTASSIUM: 4.2 mmol/L (ref 3.5–5.1)
Sodium: 138 mmol/L (ref 135–145)

## 2017-12-12 MED ORDER — ACETAMINOPHEN 500 MG PO TABS: 500.0000 mg | ORAL_TABLET | Freq: Four times a day (QID) | ORAL | Status: DC | PRN

## 2017-12-12 MED ORDER — GABAPENTIN 400 MG PO CAPS: 400.0000 mg | ORAL_CAPSULE | Freq: Three times a day (TID) | ORAL | Status: DC

## 2017-12-12 MED ORDER — INSULIN ASPART 100 UNIT/ML ~~LOC~~ SOLN: 0.0000 [IU] | Freq: Three times a day (TID) | SUBCUTANEOUS | Status: DC

## 2017-12-12 MED ORDER — FUROSEMIDE 20 MG PO TABS: 20.0000 mg | ORAL_TABLET | Freq: Every day | ORAL | Status: DC

## 2017-12-12 MED ORDER — LOPERAMIDE HCL 2 MG PO CAPS: 2.0000 mg | ORAL_CAPSULE | Freq: Two times a day (BID) | ORAL | Status: DC | PRN

## 2017-12-12 MED ORDER — PRO-STAT SUGAR FREE PO LIQD: 30.0000 mL | Freq: Two times a day (BID) | ORAL | Status: DC

## 2017-12-12 NOTE — Discharge Summary (Signed)
Physician Discharge Summary  [name] DSK:[id number] DOB: 02-23-1941  PCP: Kathyrn Lass, MD  Admit date: 12/06/2017 Discharge date: 12/12/2017  Recommendations for Outpatient Follow-up:  1. M.D. at SNF in 3 days with repeat labs (CBC & BMP). 2. Dr. Kathyrn Lass, PCP upon discharge from SNF. 3. Recommend outpatient GI consultation for evaluation of chronic stool incontinence/? Diarrhea. 4. Recommend repeating chest x-ray in 4 weeks.  Home Health: None Equipment/Devices: None    Discharge Condition: Improved and stable  CODE STATUS: Full  Diet recommendation: Heart healthy & diabetic diet.  Discharge Diagnoses:  Principal Problem:   CAP (community acquired pneumonia) Active Problems:   ADENOCARCINOMA, PROSTATE   OBESITY, MORBID   ANXIETY DEPRESSION   HYPERTENSION, BENIGN ESSENTIAL   VENOUS STASIS ULCER   Venous (peripheral) insufficiency   Decubitus ulcer of heel   Dyspnea   Acute heart failure (HCC)   Pleural effusion on right   ARF (acute renal failure) (HCC)   Brief Summary: 77 year old male with PMH of chronic diastolic CHF, DM 2, HTN, HLD, chronic venous insufficiency, prostate cancer status post radiation, presented to ED due to worsening dyspnea, orthopnea, dry cough, decreased oral intake and generalized weakness. In ED, BNP over 1600, chest x-ray concerning for CHF along with opacity in right lower lobe base. Admitted for dyspnea secondary to community-acquired pneumonia and acute on chronic diastolic CHF.   Assessment & Plan:   1. Acute on chronic diastolic CHF: On admission, BNP >1600 and chest x-ray concerning for congestive heart failure. Treated empirically with IV Lasix 40 mg twice daily, then transitioned to 20 mg once daily and stopped on 12/29 due to acute kidney injury. TTE 12/28: LVEF 55-60 percent. Appears mildly volume overloaded. Chest x-ray shows increase in bibasilar infiltrates. ARB was held for a day. Resume Lasix 20 mg daily from 12/11/16.  Creatinine stable in the 1.6 range. Continue current dose of Lasix with close monitoring of BMP as outpatient. 2. Community-acquired pneumonia: Blood cultures 2 negative. Completed 6 days of ceftriaxone and azithromycin, discontinued. Recommend follow-up chest x-ray in 4 weeks. 3. Type II DM: Despite holding home dose of 70/30 insulin and only treating with SSI in the hospital, reasonable CBG control. Thereby continue SSI only at SNF. If CBGs start to rise, then consider resuming prior home dose of 70/30 insulin. A1c: 6.8. 4. Acute on stage III chronic kidney disease: Baseline creatinine not known. Last known creatinine 06/16/11:0.82. Presented with creatinine of 1.39 which peaked 1.87. Diuretics held. Creatinine has improved to 1.6. Renal ultrasound shows no hydronephrosis. Normal bladder. Held ARB for a day and started low-dose Lasix. Creatinine stable in the 1.6 range. Follow BMP closely as outpatient. Neck consider nephrology consultation but patient likely not candidate for dialysis. 5. Chronic lower extremity venous stasis: Has wounds on his legs, management per wound care team. Recently had Una bruits removed. Lower extremity edema is better than yesterday.  6. Essential hypertension: Controlled. Continue ARB.  7. Obesity/Body mass index is 33.91 kg/m. 8. Anemia: May be chronic disease versus chronic kidney disease. Stable. 9. Diarrhea/stool incontinence: C. difficile and GI pathogen panel PCR negative. Long-standing complaint for several months. Patient and spouse report that he has never had a colonoscopy. Recommend outpatient GI consultation. Trial of Imodium. Etiology unclear. Patient has had radiation for his prostate cancer and not sure if this is related to that.  10. Microscopic hematuria: Unclear etiology. Recommend outpatient follow-up and evaluation as deemed necessary.    Consultants:  None   Procedures:  None     Discharge Instructions  Discharge Instructions    (HEART  FAILURE PATIENTS) Call MD:  Anytime you have any of the following symptoms: 1) 3 pound weight gain in 24 hours or 5 pounds in 1 week 2) shortness of breath, with or without a dry hacking cough 3) swelling in the hands, feet or stomach 4) if you have to sleep on extra pillows at night in order to breathe.   Complete by:  As directed    Call MD for:  difficulty breathing, headache or visual disturbances   Complete by:  As directed    Call MD for:  extreme fatigue   Complete by:  As directed    Call MD for:  persistant dizziness or light-headedness   Complete by:  As directed    Call MD for:  severe uncontrolled pain   Complete by:  As directed    Call MD for:  temperature >100.4   Complete by:  As directed    Diet - low sodium heart healthy   Complete by:  As directed    Diet Carb Modified   Complete by:  As directed    Increase activity slowly   Complete by:  As directed        Medication List    STOP taking these medications   BISCOLAX 10 MG suppository Generic drug:  bisacodyl   NOVOLIN 70/30 (70-30) 100 UNIT/ML injection Generic drug:  insulin NPH-regular Human   spironolactone 25 MG tablet Commonly known as:  ALDACTONE     TAKE these medications   acetaminophen 500 MG tablet Commonly known as:  TYLENOL Take 1 tablet (500 mg total) by mouth every 6 (six) hours as needed for mild pain or moderate pain. What changed:  reasons to take this   aspirin 325 MG tablet Take 325 mg by mouth daily.   bismuth subsalicylate 259 DG/38VF suspension Commonly known as:  PEPTO BISMOL Take 30 mLs by mouth every 6 (six) hours as needed for indigestion.   BUDEPRION XL 300 MG 24 hr tablet Generic drug:  buPROPion Take 300 mg by mouth daily.   CALCIUM-VITAMIN D PO Take 1 tablet by mouth daily.   feeding supplement (PRO-STAT SUGAR FREE 64) Liqd Take 30 mLs by mouth 2 (two) times daily.   furosemide 20 MG tablet Commonly known as:  LASIX Take 1 tablet (20 mg total) by mouth  daily. Start taking on:  12/13/2017   gabapentin 400 MG capsule Commonly known as:  NEURONTIN Take 1 capsule (400 mg total) by mouth 3 (three) times daily. What changed:  when to take this   hydrOXYzine 25 MG tablet Commonly known as:  ATARAX/VISTARIL Take 25 mg by mouth at bedtime as needed. For itchiness.   insulin aspart 100 UNIT/ML injection Commonly known as:  novoLOG Inject 0-15 Units into the skin 3 (three) times daily with meals. CBG < 70: implement hypoglycemia protocol CBG 70 - 120: 0 units CBG 121 - 150: 2 units CBG 151 - 200: 3 units CBG 201 - 250: 5 units CBG 251 - 300: 8 units CBG 301 - 350: 11 units CBG 351 - 400: 15 units CBG > 400: call MD.   LEXAPRO 10 MG tablet Generic drug:  escitalopram Take 10 mg by mouth daily.   LIPITOR 80 MG tablet Generic drug:  atorvastatin Take 80 mg by mouth at bedtime.   loperamide 2 MG capsule Commonly known as:  IMODIUM Take 1 capsule (2 mg total) by  mouth 2 (two) times daily as needed for diarrhea or loose stools.   losartan 50 MG tablet Commonly known as:  COZAAR Take 50 mg by mouth daily.   RAPAFLO 8 MG Caps capsule Generic drug:  silodosin Take 1 capsule by mouth daily.       Contact information for follow-up providers    Kathyrn Lass, MD. Schedule an appointment as soon as possible for a visit.   Specialty:  Family Medicine Why:  Upon discharge from SNF. Contact information: [place] [address] [phone]        M.D. at SNF. Schedule an appointment as soon as possible for a visit in 3 day(s).   Why:  To be seen with repeat labs (CBC & BMP).           Contact information for after-discharge care    Destination    [facility] Follow up.   Service:  Skilled Nursing Contact information: [address] [place] [phone]                 No Known Allergies    Procedures/Studies: Dg Chest 2 View  Result  Date: 12/11/2017 CLINICAL DATA:  Weakness EXAM: CHEST  2 VIEW COMPARISON:  12/06/2017 FINDINGS: Cardiac shadow is enlarged but stable. Bibasilar infiltrates are noted increased from the prior exam. Small right effusion is noted. No bony abnormality is seen. IMPRESSION: Bibasilar infiltrates increased from the prior exam. Electronically Signed   By: Inez Catalina M.D.   On: 12/11/2017 09:21   Dg Chest 2 View  Result Date: 12/06/2017 CLINICAL DATA:  Shortness of breath increasing over the past week. History of CHF, prostate malignancy, diabetes, morbid obesity. EXAM: CHEST  2 VIEW COMPARISON:  PA and lateral chest x-ray of June 26, 2017 FINDINGS: The lungs are adequately inflated. The interstitial markings are mildly increased and are more conspicuous than in the past. There are confluent airspace opacities at the right lung base and in the left infrahilar region. There may be a small amount of pleural fluid blunting the right lateral and posterior costophrenic angles. The cardiac silhouette is enlarged. The pulmonary vascularity is mildly prominent though stable. There is calcification in the wall of the thoracic aorta. There is multilevel degenerative disc disease of the thoracic spine. IMPRESSION: Probable acute bilateral pneumonia superimposed upon low-grade CHF. New small right pleural effusion. Thoracic aortic atherosclerosis. Electronically Signed   By: David  Martinique M.D.   On: 12/06/2017 09:55   US Renal  Result Date: 12/09/2017 CLINICAL DATA:  Acute renal failure. Inpatient. History prostate cancer. EXAM: RENAL / URINARY TRACT ULTRASOUND COMPLETE COMPARISON:  07/24/2017 renal sonogram FINDINGS: Right Kidney: Length: 10.4 cm. Moderately atrophic echogenic right renal parenchyma. No right hydronephrosis. No right renal mass. Incidental right pleural effusion. Left Kidney: Length: 12.1 cm. Simple 1.4 cm upper left renal cyst. No left hydronephrosis. Mildly atrophic echogenic left renal parenchyma.  Bladder: Appears normal for degree of bladder distention. IMPRESSION: 1. No hydronephrosis. 2. Echogenic atrophic kidneys, compatible with acute on chronic nonspecific renal parenchymal disease. 3. Normal bladder. 4. Incidental right pleural effusion. Electronically Signed   By: Ilona Sorrel M.D.   On: 12/09/2017 11:35   Dg Hip Unilat W Or Wo Pelvis 2-3 Views Right  Result Date: 12/06/2017 CLINICAL DATA:  Right hip pain after multiple falls. EXAM: DG HIP (WITH OR WITHOUT PELVIS) 2-3V RIGHT COMPARISON:  Left hip x-rays dated September 05, 2010. CT abdomen pelvis dated December 09, 2009. FINDINGS: No acute fracture or malalignment. Severe, end-stage degenerative changes of the right hip, with superior migration of the right femoral head. Mild degenerative changes of the left hip joint. Degenerative changes of the lower lumbar spine. IMPRESSION: Severe right hip osteoarthritis.  No acute osseous abnormality. Electronically Signed   By: Titus Dubin M.D.   On: 12/06/2017 13:43      Subjective: Feels better. Urinated more. No dyspnea, cough, fever, chills or chest pain reported. Ongoing chronic stool incontinence but did not receive Imodium until this morning.  Discharge Exam:  Vitals:   12/11/17 0347 12/11/17 1447 12/11/17 2027 12/12/17 0649  BP: (!) 160/79 (!) 124/58 117/60 (!) 148/68  Pulse: 76 69 66 65  Resp: 14 18 18 18   Temp: 98.2 F (36.8 C) 98 F (36.7 C) 98.2 F (36.8 C) 98.1 F (36.7 C)  TempSrc: Oral Oral Oral Oral  SpO2: 95% 96% 96% 93%  Weight: 119.8 kg (264 lb 1.8 oz)   116.2 kg (256 lb 2.8 oz)  Height:        General exam: Pleasant elderly male, moderately built and nourished, lying comfortably supine in bed. Respiratory system: Occasional basal crackles but otherwise clear to auscultation. Respiratory effort normal.  Cardiovascular system: S1 & S2 heard, RRR. No JVD, murmurs, rubs, gallops or clicks. Trace pedal edema.  Gastrointestinal system: Abdomen is nondistended,  soft and nontender. No organomegaly or masses felt. Normal bowel sounds heard. Central nervous system: Alert and oriented. No focal neurological deficits. Extremities: Symmetric 5 x 5 power. Skin: Changes of chronic lower extremity venous stasis. Psychiatry: Judgement and insight appear normal. Mood & affect appropriate.       The results of significant diagnostics from this hospitalization (including imaging, microbiology, ancillary and laboratory) are listed below for reference.     Microbiology: Recent Results (from the past 240 hour(s))  Blood culture (routine x 2)     Status: None   Collection Time: 12/06/17 10:42 AM  Result Value Ref Range Status   Specimen Description BLOOD LEFT ARM  Final   Special Requests   Final    BOTTLES DRAWN AEROBIC AND ANAEROBIC Blood Culture adequate volume   Culture NO GROWTH 5 DAYS  Final   Report Status 12/11/2017 FINAL  Final  Blood culture (routine x 2)     Status: None   Collection Time: 12/06/17 10:45 AM  Result Value Ref Range Status   Specimen Description BLOOD RIGHT ARM  Final   Special Requests   Final    BOTTLES DRAWN AEROBIC AND ANAEROBIC Blood Culture adequate volume   Culture NO GROWTH 5 DAYS  Final   Report Status 12/11/2017 FINAL  Final  Urine culture     Status: None   Collection Time: 12/06/17 11:46 AM  Result Value Ref Range Status   Specimen Description URINE, RANDOM  Final   Special Requests NONE  Final   Culture NO GROWTH  Final   Report Status 12/07/2017 FINAL  Final  C difficile quick scan w PCR reflex     Status: None   Collection Time: 12/06/17 11:46 AM  Result Value Ref Range Status   C Diff antigen NEGATIVE NEGATIVE Final   C Diff toxin NEGATIVE NEGATIVE Final   C Diff interpretation No C. difficile detected.  Final  Gastrointestinal Panel by PCR , Stool     Status: None   Collection Time: 12/06/17 11:46 AM  Result Value Ref Range Status   Campylobacter species NOT DETECTED NOT DETECTED  Final   Plesimonas  shigelloides NOT DETECTED NOT DETECTED Final   Salmonella species NOT DETECTED NOT DETECTED Final   Yersinia enterocolitica NOT DETECTED NOT DETECTED Final   Vibrio species NOT DETECTED NOT DETECTED Final   Vibrio cholerae NOT DETECTED NOT DETECTED Final   Enteroaggregative E coli (EAEC) NOT DETECTED NOT DETECTED Final   Enteropathogenic E coli (EPEC) NOT DETECTED NOT DETECTED Final   Enterotoxigenic E coli (ETEC) NOT DETECTED NOT DETECTED Final   Shiga like toxin producing E coli (STEC) NOT DETECTED NOT DETECTED Final   Shigella/Enteroinvasive E coli (EIEC) NOT DETECTED NOT DETECTED Final   Cryptosporidium NOT DETECTED NOT DETECTED Final   Cyclospora cayetanensis NOT DETECTED NOT DETECTED Final   Entamoeba histolytica NOT DETECTED NOT DETECTED Final   Giardia lamblia NOT DETECTED NOT DETECTED Final   Adenovirus F40/41 NOT DETECTED NOT DETECTED Final   Astrovirus NOT DETECTED NOT DETECTED Final   Norovirus GI/GII NOT DETECTED NOT DETECTED Final   Rotavirus A NOT DETECTED NOT DETECTED Final   Sapovirus (I, II, IV, and V) NOT DETECTED NOT DETECTED Final    Comment: Performed at [facility], [address] [place], [place] [address]     Labs: CBC: Recent Labs  Lab 12/06/17 [phone] 12/08/17 0426 12/09/17 0436 12/10/17 0552  WBC 6.6 5.6 5.6 5.3  NEUTROABS 4.8  --   --   --   HGB 9.3* 8.5* 9.1* 8.8*  HCT 29.1* 26.9* 28.8* 28.1*  MCV 91.5 92.8 92.9 92.4  PLT 274 280 291 109   Basic Metabolic Panel: Recent Labs  Lab 12/06/17 0926  12/08/17 0426 12/09/17 0436 12/10/17 0552 12/11/17 0419 12/12/17 0837  NA 140   < > 138 135 137 135 138  K 3.9   < > 4.4 4.3 4.4 4.4 4.2  CL 106   < > 104 101 103 103 102  CO2 25   < > 25 26 25 24 28   GLUCOSE 189*   < > 136* 136* 119* 115* 138*  BUN 20   < > 30* 36* 38* 39* 37*  CREATININE 1.39*   < > 1.87* 1.87* 1.80* 1.62* 1.68*  CALCIUM 8.4*   < > 8.1* 8.3* 8.4* 8.4* 8.7*  MG 1.9  --   --   --   --   --   --    < > = values in this  interval not displayed.   Liver Function Tests: Recent Labs  Lab 12/08/17 0426 12/09/17 0436 12/10/17 0552  AST 12* 14* 14*  ALT 10* 9* 11*  ALKPHOS 92 95 90  BILITOT 0.6 0.4 0.5  PROT 6.4* 7.2 6.5  ALBUMIN 2.5* 2.7* 2.6*   BNP (last 3 results) Recent Labs    12/06/17 0909  BNP 1,657.7*   Cardiac Enzymes: Recent Labs  Lab 12/06/17 1636  TROPONINI <0.03   CBG: Recent Labs  Lab 12/11/17 1144 12/11/17 1609 12/11/17 2127 12/12/17 0731 12/12/17 1143  GLUCAP 159* 153* 137* 94 162*   Urinalysis    Component Value Date/Time   COLORURINE YELLOW 12/06/2017 1146   APPEARANCEUR CLEAR 12/06/2017 1146   LABSPEC 1.013 12/06/2017 1146   PHURINE 5.0 12/06/2017 1146   GLUCOSEU 50 (A) 12/06/2017 1146   HGBUR MODERATE (A) 12/06/2017 1146   BILIRUBINUR NEGATIVE 12/06/2017 1146   KETONESUR NEGATIVE 12/06/2017 1146   PROTEINUR 100 (A) 12/06/2017 1146   UROBILINOGEN 0.2 06/09/2011 0522   NITRITE NEGATIVE 12/06/2017 1146   LEUKOCYTESUR NEGATIVE 12/06/2017 1146  Discussed in detail with patient's spouse at bedside. Updated care and answered questions.  Time coordinating discharge: Over 30 minutes  SIGNED:  Vernell Leep, MD, FACP, [facility]. [facility] Pager [phone]  If 7PM-7AM, please contact night-coverage [email or web address] Password [email or web address] 12/12/2017, 2:22 PM

## 2017-12-12 NOTE — Plan of Care (Signed)
Pt. Requires max assistance with ADLs and toileting.

## 2017-12-12 NOTE — Clinical Social Work Placement (Signed)
   CLINICAL SOCIAL WORK PLACEMENT  NOTE  Date:  12/12/2017  Patient Details  Name: [name] MRN: [id number] Date of Birth: Aug 17, 1941  Clinical Social Work is seeking post-discharge placement for this patient at the [place] level of care (*CSW will initial, date and re-position this form in  chart as items are completed):  Yes   Patient/family provided with [place] Work Department's list of facilities offering this level of care within the geographic area requested by the patient (or if unable, by the patient's family).  Yes   Patient/family informed of their freedom to choose among providers that offer the needed level of care, that participate in Medicare, Medicaid or managed care program needed by the patient, have an available bed and are willing to accept the patient.  Yes   Patient/family informed of [facility]'s ownership interest in [facility] and [facility], as well as of the fact that they are under no obligation to receive care at these facilities.  PASRR submitted to EDS on       PASRR number received on       Existing PASRR number confirmed on 12/08/17     FL2 transmitted to all facilities in geographic area requested by pt/family on 12/08/17     FL2 transmitted to all facilities within larger geographic area on       Patient informed that his/her managed care company has contracts with or will negotiate with certain facilities, including the following:        Yes   Patient/family informed of bed offers received.  Patient chooses bed at [facility] [place] recommends and patient chooses bed at      Patient to be transferred to [facility] at [place] on 12/12/17.  Patient to be transferred to facility by [facility]     Patient family notified on 12/12/17 of transfer.  Name of family member notified:  [name] Weisinger     PHYSICIAN Please prepare prescriptions     Additional Comment:     _______________________________________________ Candie Chroman, LCSW 12/12/2017, 2:42 PM

## 2017-12-12 NOTE — Discharge Instructions (Signed)

## 2017-12-12 NOTE — Progress Notes (Signed)
PT Cancellation Note  Patient Details Name: [name] MRN: [id number] DOB: 08/27/1941   Cancelled Treatment:    Reason Eval/Treat Not Completed: Other (comment). Pt reports he is going to SNF today and his rt leg is bothering him at this time. Politely ask to defer today to SNF.  Shary Decamp [place] 12/12/2017, 10:34 AM [place]

## 2017-12-12 NOTE — Clinical Social Work Note (Signed)
CSW facilitated patient discharge including contacting patient family and facility to confirm patient discharge plans. Clinical information faxed to facility and family agreeable with plan. CSW arranged ambulance transport via [facility] to [place]. RN to call report prior to discharge [phone] Room [id number]).  CSW will sign off for now as social work intervention is no longer needed. Please consult [place] again if new needs arise.  [name], [name]

## 2017-12-31 DIAGNOSIS — R197 Diarrhea, unspecified: Secondary | ICD-10-CM | POA: Diagnosis not present

## 2018-01-01 DIAGNOSIS — M150 Primary generalized (osteo)arthritis: Secondary | ICD-10-CM | POA: Diagnosis not present

## 2018-01-01 DIAGNOSIS — R2689 Other abnormalities of gait and mobility: Secondary | ICD-10-CM | POA: Diagnosis not present

## 2018-01-01 DIAGNOSIS — M25551 Pain in right hip: Secondary | ICD-10-CM | POA: Diagnosis not present

## 2018-01-01 DIAGNOSIS — M25561 Pain in right knee: Secondary | ICD-10-CM | POA: Diagnosis not present

## 2018-01-02 DIAGNOSIS — L89890 Pressure ulcer of other site, unstageable: Secondary | ICD-10-CM | POA: Diagnosis not present

## 2018-01-09 DIAGNOSIS — L89890 Pressure ulcer of other site, unstageable: Secondary | ICD-10-CM | POA: Diagnosis not present

## 2018-01-14 DIAGNOSIS — I872 Venous insufficiency (chronic) (peripheral): Secondary | ICD-10-CM | POA: Diagnosis not present

## 2018-01-14 DIAGNOSIS — F329 Major depressive disorder, single episode, unspecified: Secondary | ICD-10-CM | POA: Diagnosis not present

## 2018-01-14 DIAGNOSIS — I5033 Acute on chronic diastolic (congestive) heart failure: Secondary | ICD-10-CM | POA: Diagnosis not present

## 2018-01-14 DIAGNOSIS — E1122 Type 2 diabetes mellitus with diabetic chronic kidney disease: Secondary | ICD-10-CM | POA: Diagnosis not present

## 2018-01-14 DIAGNOSIS — E1140 Type 2 diabetes mellitus with diabetic neuropathy, unspecified: Secondary | ICD-10-CM | POA: Diagnosis not present

## 2018-01-14 DIAGNOSIS — I130 Hypertensive heart and chronic kidney disease with heart failure and stage 1 through stage 4 chronic kidney disease, or unspecified chronic kidney disease: Secondary | ICD-10-CM | POA: Diagnosis not present

## 2018-01-14 DIAGNOSIS — L89620 Pressure ulcer of left heel, unstageable: Secondary | ICD-10-CM | POA: Diagnosis not present

## 2018-01-14 DIAGNOSIS — L97929 Non-pressure chronic ulcer of unspecified part of left lower leg with unspecified severity: Secondary | ICD-10-CM | POA: Diagnosis not present

## 2018-01-14 DIAGNOSIS — C61 Malignant neoplasm of prostate: Secondary | ICD-10-CM | POA: Diagnosis not present

## 2018-01-14 DIAGNOSIS — N183 Chronic kidney disease, stage 3 (moderate): Secondary | ICD-10-CM | POA: Diagnosis not present

## 2018-01-15 DIAGNOSIS — I130 Hypertensive heart and chronic kidney disease with heart failure and stage 1 through stage 4 chronic kidney disease, or unspecified chronic kidney disease: Secondary | ICD-10-CM | POA: Diagnosis not present

## 2018-01-15 DIAGNOSIS — C61 Malignant neoplasm of prostate: Secondary | ICD-10-CM | POA: Diagnosis not present

## 2018-01-15 DIAGNOSIS — I872 Venous insufficiency (chronic) (peripheral): Secondary | ICD-10-CM | POA: Diagnosis not present

## 2018-01-15 DIAGNOSIS — L89620 Pressure ulcer of left heel, unstageable: Secondary | ICD-10-CM | POA: Diagnosis not present

## 2018-01-15 DIAGNOSIS — L97929 Non-pressure chronic ulcer of unspecified part of left lower leg with unspecified severity: Secondary | ICD-10-CM | POA: Diagnosis not present

## 2018-01-15 DIAGNOSIS — I5033 Acute on chronic diastolic (congestive) heart failure: Secondary | ICD-10-CM | POA: Diagnosis not present

## 2018-01-18 DIAGNOSIS — L97929 Non-pressure chronic ulcer of unspecified part of left lower leg with unspecified severity: Secondary | ICD-10-CM | POA: Diagnosis not present

## 2018-01-18 DIAGNOSIS — C61 Malignant neoplasm of prostate: Secondary | ICD-10-CM | POA: Diagnosis not present

## 2018-01-18 DIAGNOSIS — I872 Venous insufficiency (chronic) (peripheral): Secondary | ICD-10-CM | POA: Diagnosis not present

## 2018-01-18 DIAGNOSIS — L89620 Pressure ulcer of left heel, unstageable: Secondary | ICD-10-CM | POA: Diagnosis not present

## 2018-01-18 DIAGNOSIS — I130 Hypertensive heart and chronic kidney disease with heart failure and stage 1 through stage 4 chronic kidney disease, or unspecified chronic kidney disease: Secondary | ICD-10-CM | POA: Diagnosis not present

## 2018-01-18 DIAGNOSIS — I5033 Acute on chronic diastolic (congestive) heart failure: Secondary | ICD-10-CM | POA: Diagnosis not present

## 2018-01-21 DIAGNOSIS — L89620 Pressure ulcer of left heel, unstageable: Secondary | ICD-10-CM | POA: Diagnosis not present

## 2018-01-21 DIAGNOSIS — L97929 Non-pressure chronic ulcer of unspecified part of left lower leg with unspecified severity: Secondary | ICD-10-CM | POA: Diagnosis not present

## 2018-01-21 DIAGNOSIS — I5033 Acute on chronic diastolic (congestive) heart failure: Secondary | ICD-10-CM | POA: Diagnosis not present

## 2018-01-21 DIAGNOSIS — C61 Malignant neoplasm of prostate: Secondary | ICD-10-CM | POA: Diagnosis not present

## 2018-01-21 DIAGNOSIS — I872 Venous insufficiency (chronic) (peripheral): Secondary | ICD-10-CM | POA: Diagnosis not present

## 2018-01-21 DIAGNOSIS — I130 Hypertensive heart and chronic kidney disease with heart failure and stage 1 through stage 4 chronic kidney disease, or unspecified chronic kidney disease: Secondary | ICD-10-CM | POA: Diagnosis not present

## 2018-01-22 DIAGNOSIS — J189 Pneumonia, unspecified organism: Secondary | ICD-10-CM | POA: Diagnosis not present

## 2018-01-22 DIAGNOSIS — I10 Essential (primary) hypertension: Secondary | ICD-10-CM | POA: Diagnosis not present

## 2018-01-22 DIAGNOSIS — I878 Other specified disorders of veins: Secondary | ICD-10-CM | POA: Diagnosis not present

## 2018-01-22 DIAGNOSIS — L89620 Pressure ulcer of left heel, unstageable: Secondary | ICD-10-CM | POA: Diagnosis not present

## 2018-01-22 DIAGNOSIS — I5032 Chronic diastolic (congestive) heart failure: Secondary | ICD-10-CM | POA: Diagnosis not present

## 2018-01-22 DIAGNOSIS — I519 Heart disease, unspecified: Secondary | ICD-10-CM | POA: Diagnosis not present

## 2018-01-22 DIAGNOSIS — F325 Major depressive disorder, single episode, in full remission: Secondary | ICD-10-CM | POA: Diagnosis not present

## 2018-01-22 DIAGNOSIS — I700 Atherosclerosis of aorta: Secondary | ICD-10-CM | POA: Diagnosis not present

## 2018-01-22 DIAGNOSIS — E1121 Type 2 diabetes mellitus with diabetic nephropathy: Secondary | ICD-10-CM | POA: Diagnosis not present

## 2018-01-22 DIAGNOSIS — E782 Mixed hyperlipidemia: Secondary | ICD-10-CM | POA: Diagnosis not present

## 2018-01-22 DIAGNOSIS — Z993 Dependence on wheelchair: Secondary | ICD-10-CM | POA: Diagnosis not present

## 2018-01-22 DIAGNOSIS — D649 Anemia, unspecified: Secondary | ICD-10-CM | POA: Diagnosis not present

## 2018-01-22 DIAGNOSIS — N183 Chronic kidney disease, stage 3 (moderate): Secondary | ICD-10-CM | POA: Diagnosis not present

## 2018-01-23 DIAGNOSIS — L89620 Pressure ulcer of left heel, unstageable: Secondary | ICD-10-CM | POA: Diagnosis not present

## 2018-01-23 DIAGNOSIS — I130 Hypertensive heart and chronic kidney disease with heart failure and stage 1 through stage 4 chronic kidney disease, or unspecified chronic kidney disease: Secondary | ICD-10-CM | POA: Diagnosis not present

## 2018-01-23 DIAGNOSIS — C61 Malignant neoplasm of prostate: Secondary | ICD-10-CM | POA: Diagnosis not present

## 2018-01-23 DIAGNOSIS — L97929 Non-pressure chronic ulcer of unspecified part of left lower leg with unspecified severity: Secondary | ICD-10-CM | POA: Diagnosis not present

## 2018-01-23 DIAGNOSIS — I872 Venous insufficiency (chronic) (peripheral): Secondary | ICD-10-CM | POA: Diagnosis not present

## 2018-01-23 DIAGNOSIS — I5033 Acute on chronic diastolic (congestive) heart failure: Secondary | ICD-10-CM | POA: Diagnosis not present

## 2018-01-28 DIAGNOSIS — I130 Hypertensive heart and chronic kidney disease with heart failure and stage 1 through stage 4 chronic kidney disease, or unspecified chronic kidney disease: Secondary | ICD-10-CM | POA: Diagnosis not present

## 2018-01-28 DIAGNOSIS — L97929 Non-pressure chronic ulcer of unspecified part of left lower leg with unspecified severity: Secondary | ICD-10-CM | POA: Diagnosis not present

## 2018-01-28 DIAGNOSIS — L03116 Cellulitis of left lower limb: Secondary | ICD-10-CM | POA: Diagnosis not present

## 2018-01-28 DIAGNOSIS — I872 Venous insufficiency (chronic) (peripheral): Secondary | ICD-10-CM | POA: Diagnosis not present

## 2018-01-28 DIAGNOSIS — C61 Malignant neoplasm of prostate: Secondary | ICD-10-CM | POA: Diagnosis not present

## 2018-01-28 DIAGNOSIS — I5033 Acute on chronic diastolic (congestive) heart failure: Secondary | ICD-10-CM | POA: Diagnosis not present

## 2018-01-28 DIAGNOSIS — L89620 Pressure ulcer of left heel, unstageable: Secondary | ICD-10-CM | POA: Diagnosis not present

## 2018-01-29 DIAGNOSIS — C61 Malignant neoplasm of prostate: Secondary | ICD-10-CM | POA: Diagnosis not present

## 2018-01-29 DIAGNOSIS — L97929 Non-pressure chronic ulcer of unspecified part of left lower leg with unspecified severity: Secondary | ICD-10-CM | POA: Diagnosis not present

## 2018-01-29 DIAGNOSIS — L89620 Pressure ulcer of left heel, unstageable: Secondary | ICD-10-CM | POA: Diagnosis not present

## 2018-01-29 DIAGNOSIS — I130 Hypertensive heart and chronic kidney disease with heart failure and stage 1 through stage 4 chronic kidney disease, or unspecified chronic kidney disease: Secondary | ICD-10-CM | POA: Diagnosis not present

## 2018-01-29 DIAGNOSIS — I5033 Acute on chronic diastolic (congestive) heart failure: Secondary | ICD-10-CM | POA: Diagnosis not present

## 2018-01-29 DIAGNOSIS — I872 Venous insufficiency (chronic) (peripheral): Secondary | ICD-10-CM | POA: Diagnosis not present

## 2018-01-30 DIAGNOSIS — I872 Venous insufficiency (chronic) (peripheral): Secondary | ICD-10-CM | POA: Diagnosis not present

## 2018-01-30 DIAGNOSIS — I130 Hypertensive heart and chronic kidney disease with heart failure and stage 1 through stage 4 chronic kidney disease, or unspecified chronic kidney disease: Secondary | ICD-10-CM | POA: Diagnosis not present

## 2018-01-30 DIAGNOSIS — I5033 Acute on chronic diastolic (congestive) heart failure: Secondary | ICD-10-CM | POA: Diagnosis not present

## 2018-01-30 DIAGNOSIS — C61 Malignant neoplasm of prostate: Secondary | ICD-10-CM | POA: Diagnosis not present

## 2018-01-30 DIAGNOSIS — L97929 Non-pressure chronic ulcer of unspecified part of left lower leg with unspecified severity: Secondary | ICD-10-CM | POA: Diagnosis not present

## 2018-01-30 DIAGNOSIS — L89620 Pressure ulcer of left heel, unstageable: Secondary | ICD-10-CM | POA: Diagnosis not present

## 2018-01-31 DIAGNOSIS — C61 Malignant neoplasm of prostate: Secondary | ICD-10-CM | POA: Diagnosis not present

## 2018-01-31 DIAGNOSIS — I5033 Acute on chronic diastolic (congestive) heart failure: Secondary | ICD-10-CM | POA: Diagnosis not present

## 2018-01-31 DIAGNOSIS — L89620 Pressure ulcer of left heel, unstageable: Secondary | ICD-10-CM | POA: Diagnosis not present

## 2018-01-31 DIAGNOSIS — I130 Hypertensive heart and chronic kidney disease with heart failure and stage 1 through stage 4 chronic kidney disease, or unspecified chronic kidney disease: Secondary | ICD-10-CM | POA: Diagnosis not present

## 2018-01-31 DIAGNOSIS — I872 Venous insufficiency (chronic) (peripheral): Secondary | ICD-10-CM | POA: Diagnosis not present

## 2018-01-31 DIAGNOSIS — L97929 Non-pressure chronic ulcer of unspecified part of left lower leg with unspecified severity: Secondary | ICD-10-CM | POA: Diagnosis not present

## 2018-02-01 DIAGNOSIS — L03116 Cellulitis of left lower limb: Secondary | ICD-10-CM | POA: Diagnosis not present

## 2018-02-01 DIAGNOSIS — R609 Edema, unspecified: Secondary | ICD-10-CM | POA: Diagnosis not present

## 2018-02-04 DIAGNOSIS — M79672 Pain in left foot: Secondary | ICD-10-CM | POA: Diagnosis not present

## 2018-02-04 DIAGNOSIS — L89622 Pressure ulcer of left heel, stage 2: Secondary | ICD-10-CM | POA: Diagnosis not present

## 2018-02-05 DIAGNOSIS — I130 Hypertensive heart and chronic kidney disease with heart failure and stage 1 through stage 4 chronic kidney disease, or unspecified chronic kidney disease: Secondary | ICD-10-CM | POA: Diagnosis not present

## 2018-02-05 DIAGNOSIS — L89620 Pressure ulcer of left heel, unstageable: Secondary | ICD-10-CM | POA: Diagnosis not present

## 2018-02-05 DIAGNOSIS — L97929 Non-pressure chronic ulcer of unspecified part of left lower leg with unspecified severity: Secondary | ICD-10-CM | POA: Diagnosis not present

## 2018-02-05 DIAGNOSIS — I5033 Acute on chronic diastolic (congestive) heart failure: Secondary | ICD-10-CM | POA: Diagnosis not present

## 2018-02-05 DIAGNOSIS — C61 Malignant neoplasm of prostate: Secondary | ICD-10-CM | POA: Diagnosis not present

## 2018-02-05 DIAGNOSIS — I872 Venous insufficiency (chronic) (peripheral): Secondary | ICD-10-CM | POA: Diagnosis not present

## 2018-02-07 DIAGNOSIS — I5033 Acute on chronic diastolic (congestive) heart failure: Secondary | ICD-10-CM | POA: Diagnosis not present

## 2018-02-07 DIAGNOSIS — I872 Venous insufficiency (chronic) (peripheral): Secondary | ICD-10-CM | POA: Diagnosis not present

## 2018-02-07 DIAGNOSIS — L89620 Pressure ulcer of left heel, unstageable: Secondary | ICD-10-CM | POA: Diagnosis not present

## 2018-02-07 DIAGNOSIS — I130 Hypertensive heart and chronic kidney disease with heart failure and stage 1 through stage 4 chronic kidney disease, or unspecified chronic kidney disease: Secondary | ICD-10-CM | POA: Diagnosis not present

## 2018-02-07 DIAGNOSIS — C61 Malignant neoplasm of prostate: Secondary | ICD-10-CM | POA: Diagnosis not present

## 2018-02-07 DIAGNOSIS — L97929 Non-pressure chronic ulcer of unspecified part of left lower leg with unspecified severity: Secondary | ICD-10-CM | POA: Diagnosis not present

## 2018-02-08 DIAGNOSIS — I5033 Acute on chronic diastolic (congestive) heart failure: Secondary | ICD-10-CM | POA: Diagnosis not present

## 2018-02-08 DIAGNOSIS — C61 Malignant neoplasm of prostate: Secondary | ICD-10-CM | POA: Diagnosis not present

## 2018-02-08 DIAGNOSIS — L89620 Pressure ulcer of left heel, unstageable: Secondary | ICD-10-CM | POA: Diagnosis not present

## 2018-02-08 DIAGNOSIS — I130 Hypertensive heart and chronic kidney disease with heart failure and stage 1 through stage 4 chronic kidney disease, or unspecified chronic kidney disease: Secondary | ICD-10-CM | POA: Diagnosis not present

## 2018-02-08 DIAGNOSIS — L97929 Non-pressure chronic ulcer of unspecified part of left lower leg with unspecified severity: Secondary | ICD-10-CM | POA: Diagnosis not present

## 2018-02-08 DIAGNOSIS — I872 Venous insufficiency (chronic) (peripheral): Secondary | ICD-10-CM | POA: Diagnosis not present

## 2018-02-12 DIAGNOSIS — L97929 Non-pressure chronic ulcer of unspecified part of left lower leg with unspecified severity: Secondary | ICD-10-CM | POA: Diagnosis not present

## 2018-02-12 DIAGNOSIS — I872 Venous insufficiency (chronic) (peripheral): Secondary | ICD-10-CM | POA: Diagnosis not present

## 2018-02-12 DIAGNOSIS — L89620 Pressure ulcer of left heel, unstageable: Secondary | ICD-10-CM | POA: Diagnosis not present

## 2018-02-12 DIAGNOSIS — I5033 Acute on chronic diastolic (congestive) heart failure: Secondary | ICD-10-CM | POA: Diagnosis not present

## 2018-02-12 DIAGNOSIS — C61 Malignant neoplasm of prostate: Secondary | ICD-10-CM | POA: Diagnosis not present

## 2018-02-12 DIAGNOSIS — I130 Hypertensive heart and chronic kidney disease with heart failure and stage 1 through stage 4 chronic kidney disease, or unspecified chronic kidney disease: Secondary | ICD-10-CM | POA: Diagnosis not present

## 2018-02-14 DIAGNOSIS — L97929 Non-pressure chronic ulcer of unspecified part of left lower leg with unspecified severity: Secondary | ICD-10-CM | POA: Diagnosis not present

## 2018-02-14 DIAGNOSIS — I872 Venous insufficiency (chronic) (peripheral): Secondary | ICD-10-CM | POA: Diagnosis not present

## 2018-02-14 DIAGNOSIS — L89620 Pressure ulcer of left heel, unstageable: Secondary | ICD-10-CM | POA: Diagnosis not present

## 2018-02-14 DIAGNOSIS — I130 Hypertensive heart and chronic kidney disease with heart failure and stage 1 through stage 4 chronic kidney disease, or unspecified chronic kidney disease: Secondary | ICD-10-CM | POA: Diagnosis not present

## 2018-02-14 DIAGNOSIS — I5033 Acute on chronic diastolic (congestive) heart failure: Secondary | ICD-10-CM | POA: Diagnosis not present

## 2018-02-14 DIAGNOSIS — C61 Malignant neoplasm of prostate: Secondary | ICD-10-CM | POA: Diagnosis not present

## 2018-02-15 DIAGNOSIS — L97929 Non-pressure chronic ulcer of unspecified part of left lower leg with unspecified severity: Secondary | ICD-10-CM | POA: Diagnosis not present

## 2018-02-15 DIAGNOSIS — I5033 Acute on chronic diastolic (congestive) heart failure: Secondary | ICD-10-CM | POA: Diagnosis not present

## 2018-02-15 DIAGNOSIS — I872 Venous insufficiency (chronic) (peripheral): Secondary | ICD-10-CM | POA: Diagnosis not present

## 2018-02-15 DIAGNOSIS — C61 Malignant neoplasm of prostate: Secondary | ICD-10-CM | POA: Diagnosis not present

## 2018-02-15 DIAGNOSIS — I130 Hypertensive heart and chronic kidney disease with heart failure and stage 1 through stage 4 chronic kidney disease, or unspecified chronic kidney disease: Secondary | ICD-10-CM | POA: Diagnosis not present

## 2018-02-15 DIAGNOSIS — L89620 Pressure ulcer of left heel, unstageable: Secondary | ICD-10-CM | POA: Diagnosis not present

## 2018-02-18 ENCOUNTER — Inpatient Hospital Stay (HOSPITAL_COMMUNITY): Payer: Medicare Other

## 2018-02-18 ENCOUNTER — Encounter (HOSPITAL_COMMUNITY): Payer: Self-pay

## 2018-02-18 ENCOUNTER — Other Ambulatory Visit: Payer: Self-pay

## 2018-02-18 ENCOUNTER — Inpatient Hospital Stay (HOSPITAL_COMMUNITY)
Admission: EM | Admit: 2018-02-18 | Discharge: 2018-02-22 | DRG: 602 | Disposition: A | Discharge status: Skilled Nursing Facility | Payer: Medicare Other | Attending: Internal Medicine | Admitting: Internal Medicine

## 2018-02-18 ENCOUNTER — Emergency Department (HOSPITAL_COMMUNITY): Payer: Medicare Other

## 2018-02-18 DIAGNOSIS — R402441 Other coma, without documented Glasgow coma scale score, or with partial score reported, in the field [EMT or ambulance]: Secondary | ICD-10-CM | POA: Diagnosis not present

## 2018-02-18 DIAGNOSIS — M1711 Unilateral primary osteoarthritis, right knee: Secondary | ICD-10-CM | POA: Diagnosis present

## 2018-02-18 DIAGNOSIS — L89609 Pressure ulcer of unspecified heel, unspecified stage: Secondary | ICD-10-CM | POA: Diagnosis present

## 2018-02-18 DIAGNOSIS — I130 Hypertensive heart and chronic kidney disease with heart failure and stage 1 through stage 4 chronic kidney disease, or unspecified chronic kidney disease: Secondary | ICD-10-CM | POA: Diagnosis present

## 2018-02-18 DIAGNOSIS — L03315 Cellulitis of perineum: Secondary | ICD-10-CM | POA: Diagnosis present

## 2018-02-18 DIAGNOSIS — R32 Unspecified urinary incontinence: Secondary | ICD-10-CM

## 2018-02-18 DIAGNOSIS — Z993 Dependence on wheelchair: Secondary | ICD-10-CM | POA: Diagnosis not present

## 2018-02-18 DIAGNOSIS — I452 Bifascicular block: Secondary | ICD-10-CM | POA: Diagnosis present

## 2018-02-18 DIAGNOSIS — L03312 Cellulitis of back [any part except buttock]: Secondary | ICD-10-CM | POA: Diagnosis not present

## 2018-02-18 DIAGNOSIS — G9340 Encephalopathy, unspecified: Secondary | ICD-10-CM | POA: Diagnosis not present

## 2018-02-18 DIAGNOSIS — L309 Dermatitis, unspecified: Secondary | ICD-10-CM | POA: Diagnosis present

## 2018-02-18 DIAGNOSIS — J90 Pleural effusion, not elsewhere classified: Secondary | ICD-10-CM | POA: Diagnosis present

## 2018-02-18 DIAGNOSIS — N183 Chronic kidney disease, stage 3 (moderate): Secondary | ICD-10-CM | POA: Diagnosis present

## 2018-02-18 DIAGNOSIS — F341 Dysthymic disorder: Secondary | ICD-10-CM | POA: Diagnosis present

## 2018-02-18 DIAGNOSIS — M549 Dorsalgia, unspecified: Secondary | ICD-10-CM | POA: Diagnosis present

## 2018-02-18 DIAGNOSIS — Z8249 Family history of ischemic heart disease and other diseases of the circulatory system: Secondary | ICD-10-CM

## 2018-02-18 DIAGNOSIS — R5381 Other malaise: Secondary | ICD-10-CM | POA: Diagnosis present

## 2018-02-18 DIAGNOSIS — I10 Essential (primary) hypertension: Secondary | ICD-10-CM | POA: Diagnosis present

## 2018-02-18 DIAGNOSIS — D649 Anemia, unspecified: Secondary | ICD-10-CM | POA: Diagnosis present

## 2018-02-18 DIAGNOSIS — L258 Unspecified contact dermatitis due to other agents: Secondary | ICD-10-CM | POA: Diagnosis not present

## 2018-02-18 DIAGNOSIS — I878 Other specified disorders of veins: Secondary | ICD-10-CM | POA: Diagnosis present

## 2018-02-18 DIAGNOSIS — E1142 Type 2 diabetes mellitus with diabetic polyneuropathy: Secondary | ICD-10-CM | POA: Diagnosis present

## 2018-02-18 DIAGNOSIS — E785 Hyperlipidemia, unspecified: Secondary | ICD-10-CM | POA: Diagnosis present

## 2018-02-18 DIAGNOSIS — R531 Weakness: Secondary | ICD-10-CM | POA: Diagnosis not present

## 2018-02-18 DIAGNOSIS — L97909 Non-pressure chronic ulcer of unspecified part of unspecified lower leg with unspecified severity: Secondary | ICD-10-CM

## 2018-02-18 DIAGNOSIS — L03115 Cellulitis of right lower limb: Secondary | ICD-10-CM | POA: Diagnosis not present

## 2018-02-18 DIAGNOSIS — N189 Chronic kidney disease, unspecified: Secondary | ICD-10-CM | POA: Diagnosis not present

## 2018-02-18 DIAGNOSIS — E860 Dehydration: Secondary | ICD-10-CM | POA: Diagnosis present

## 2018-02-18 DIAGNOSIS — M6281 Muscle weakness (generalized): Secondary | ICD-10-CM | POA: Diagnosis not present

## 2018-02-18 DIAGNOSIS — G9341 Metabolic encephalopathy: Secondary | ICD-10-CM | POA: Diagnosis present

## 2018-02-18 DIAGNOSIS — R410 Disorientation, unspecified: Secondary | ICD-10-CM | POA: Diagnosis not present

## 2018-02-18 DIAGNOSIS — R262 Difficulty in walking, not elsewhere classified: Secondary | ICD-10-CM | POA: Diagnosis not present

## 2018-02-18 DIAGNOSIS — I83009 Varicose veins of unspecified lower extremity with ulcer of unspecified site: Secondary | ICD-10-CM | POA: Diagnosis present

## 2018-02-18 DIAGNOSIS — L03317 Cellulitis of buttock: Secondary | ICD-10-CM | POA: Diagnosis not present

## 2018-02-18 DIAGNOSIS — N3941 Urge incontinence: Secondary | ICD-10-CM | POA: Diagnosis present

## 2018-02-18 DIAGNOSIS — Z820 Family history of epilepsy and other diseases of the nervous system: Secondary | ICD-10-CM

## 2018-02-18 DIAGNOSIS — Z794 Long term (current) use of insulin: Secondary | ICD-10-CM

## 2018-02-18 DIAGNOSIS — I872 Venous insufficiency (chronic) (peripheral): Secondary | ICD-10-CM | POA: Diagnosis present

## 2018-02-18 DIAGNOSIS — N178 Other acute kidney failure: Secondary | ICD-10-CM | POA: Diagnosis not present

## 2018-02-18 DIAGNOSIS — Z8546 Personal history of malignant neoplasm of prostate: Secondary | ICD-10-CM

## 2018-02-18 DIAGNOSIS — D72829 Elevated white blood cell count, unspecified: Secondary | ICD-10-CM | POA: Diagnosis present

## 2018-02-18 DIAGNOSIS — L89623 Pressure ulcer of left heel, stage 3: Secondary | ICD-10-CM | POA: Diagnosis present

## 2018-02-18 DIAGNOSIS — D509 Iron deficiency anemia, unspecified: Secondary | ICD-10-CM | POA: Diagnosis not present

## 2018-02-18 DIAGNOSIS — E1169 Type 2 diabetes mellitus with other specified complication: Secondary | ICD-10-CM | POA: Diagnosis not present

## 2018-02-18 DIAGNOSIS — E1122 Type 2 diabetes mellitus with diabetic chronic kidney disease: Secondary | ICD-10-CM | POA: Diagnosis present

## 2018-02-18 DIAGNOSIS — F418 Other specified anxiety disorders: Secondary | ICD-10-CM | POA: Diagnosis present

## 2018-02-18 DIAGNOSIS — R4182 Altered mental status, unspecified: Secondary | ICD-10-CM | POA: Diagnosis not present

## 2018-02-18 DIAGNOSIS — N179 Acute kidney failure, unspecified: Secondary | ICD-10-CM | POA: Diagnosis present

## 2018-02-18 DIAGNOSIS — I509 Heart failure, unspecified: Secondary | ICD-10-CM | POA: Diagnosis present

## 2018-02-18 DIAGNOSIS — R498 Other voice and resonance disorders: Secondary | ICD-10-CM | POA: Diagnosis not present

## 2018-02-18 DIAGNOSIS — R41841 Cognitive communication deficit: Secondary | ICD-10-CM | POA: Diagnosis not present

## 2018-02-18 DIAGNOSIS — R2681 Unsteadiness on feet: Secondary | ICD-10-CM | POA: Diagnosis not present

## 2018-02-18 DIAGNOSIS — L03319 Cellulitis of trunk, unspecified: Secondary | ICD-10-CM | POA: Diagnosis present

## 2018-02-18 LAB — URINALYSIS, COMPLETE (UACMP) WITH MICROSCOPIC
Bilirubin Urine: NEGATIVE
GLUCOSE, UA: 50 mg/dL — AB
KETONES UR: NEGATIVE mg/dL
LEUKOCYTES UA: NEGATIVE
Nitrite: NEGATIVE
PH: 5 (ref 5.0–8.0)
Protein, ur: 100 mg/dL — AB
SPECIFIC GRAVITY, URINE: 1.015 (ref 1.005–1.030)

## 2018-02-18 LAB — COMPREHENSIVE METABOLIC PANEL
ALBUMIN: 2.7 g/dL — AB (ref 3.5–5.0)
ALK PHOS: 95 U/L (ref 38–126)
ALT: 21 U/L (ref 17–63)
AST: 37 U/L (ref 15–41)
Anion gap: 10 (ref 5–15)
BUN: 48 mg/dL — AB (ref 6–20)
CALCIUM: 8.2 mg/dL — AB (ref 8.9–10.3)
CO2: 23 mmol/L (ref 22–32)
Chloride: 104 mmol/L (ref 101–111)
Creatinine, Ser: 2.4 mg/dL — ABNORMAL HIGH (ref 0.61–1.24)
GFR calc Af Amer: 29 mL/min — ABNORMAL LOW (ref >60)
GFR calc non Af Amer: 25 mL/min — ABNORMAL LOW (ref >60)
GLUCOSE: 158 mg/dL — AB (ref 65–99)
Potassium: 4.4 mmol/L (ref 3.5–5.1)
Sodium: 137 mmol/L (ref 135–145)
TOTAL PROTEIN: 7 g/dL (ref 6.5–8.1)
Total Bilirubin: 1 mg/dL (ref 0.3–1.2)

## 2018-02-18 LAB — CBC WITH DIFFERENTIAL/PLATELET
BASOS ABS: 0 10*3/uL (ref 0.0–0.1)
Basophils Relative: 0 %
EOS PCT: 0 %
Eosinophils Absolute: 0 10*3/uL (ref 0.0–0.7)
HCT: 30.1 % — ABNORMAL LOW (ref 39.0–52.0)
Hemoglobin: 9.8 g/dL — ABNORMAL LOW (ref 13.0–17.0)
LYMPHS PCT: 4 %
Lymphs Abs: 0.8 10*3/uL (ref 0.7–4.0)
MCH: 28.6 pg (ref 26.0–34.0)
MCHC: 32.6 g/dL (ref 30.0–36.0)
MCV: 87.8 fL (ref 78.0–100.0)
MONO ABS: 0.9 10*3/uL (ref 0.1–1.0)
MONOS PCT: 5 %
Neutro Abs: 17.4 10*3/uL — ABNORMAL HIGH (ref 1.7–7.7)
Neutrophils Relative %: 91 %
PLATELETS: 263 10*3/uL (ref 150–400)
RBC: 3.43 MIL/uL — ABNORMAL LOW (ref 4.22–5.81)
RDW: 14.9 % (ref 11.5–15.5)
WBC: 19.1 10*3/uL — ABNORMAL HIGH (ref 4.0–10.5)

## 2018-02-18 LAB — TSH: TSH: 5.247 u[IU]/mL — ABNORMAL HIGH (ref 0.350–4.500)

## 2018-02-18 LAB — GLUCOSE, CAPILLARY: Glucose-Capillary: 155 mg/dL — ABNORMAL HIGH (ref 65–99)

## 2018-02-18 LAB — MAGNESIUM: Magnesium: 1.5 mg/dL — ABNORMAL LOW (ref 1.7–2.4)

## 2018-02-18 LAB — PHOSPHORUS: PHOSPHORUS: 3.1 mg/dL (ref 2.5–4.6)

## 2018-02-18 MED ORDER — SODIUM CHLORIDE 0.9 % IV SOLN
2.0000 g | INTRAVENOUS | Status: DC
  Administered 2018-02-19 – 2018-02-21 (×3): 2 g via INTRAVENOUS
  Filled 2018-02-18: qty 20
  Filled 2018-02-18 (×3): qty 2

## 2018-02-18 MED ORDER — SODIUM CHLORIDE 0.9 % IV BOLUS (SEPSIS)
1000.0000 mL | Freq: Once | INTRAVENOUS | Status: AC
  Administered 2018-02-18: 1000 mL via INTRAVENOUS

## 2018-02-18 MED ORDER — ACETAMINOPHEN 325 MG PO TABS: 650.0000 mg | ORAL_TABLET | Freq: Four times a day (QID) | ORAL | Status: DC | PRN

## 2018-02-18 MED ORDER — SORBITOL 70 % SOLN
30.0000 mL | Freq: Every day | Status: DC | PRN
  Filled 2018-02-18: qty 30

## 2018-02-18 MED ORDER — VANCOMYCIN HCL 10 G IV SOLR
1750.0000 mg | INTRAVENOUS | Status: DC
  Administered 2018-02-18 – 2018-02-20 (×2): 1750 mg via INTRAVENOUS
  Filled 2018-02-18 (×3): qty 1750

## 2018-02-18 MED ORDER — ONDANSETRON HCL 4 MG/2ML IJ SOLN
4.0000 mg | Freq: Four times a day (QID) | INTRAMUSCULAR | Status: DC | PRN
Start: 2018-02-18 — End: 2018-02-22

## 2018-02-18 MED ORDER — BUPROPION HCL ER (XL) 300 MG PO TB24
300.0000 mg | ORAL_TABLET | Freq: Every day | ORAL | Status: DC
  Administered 2018-02-19 – 2018-02-22 (×4): 300 mg via ORAL
  Filled 2018-02-18 (×4): qty 1

## 2018-02-18 MED ORDER — SODIUM CHLORIDE 0.9 % IV SOLN
INTRAVENOUS | Status: DC
  Administered 2018-02-18 – 2018-02-19 (×2): via INTRAVENOUS

## 2018-02-18 MED ORDER — TRAMADOL HCL 50 MG PO TABS
50.0000 mg | ORAL_TABLET | Freq: Four times a day (QID) | ORAL | Status: DC | PRN
  Administered 2018-02-18 – 2018-02-21 (×3): 50 mg via ORAL
  Filled 2018-02-18 (×3): qty 1

## 2018-02-18 MED ORDER — TAMSULOSIN HCL 0.4 MG PO CAPS
0.4000 mg | ORAL_CAPSULE | Freq: Every day | ORAL | Status: DC
  Administered 2018-02-19 – 2018-02-21 (×3): 0.4 mg via ORAL
  Filled 2018-02-18 (×3): qty 1

## 2018-02-18 MED ORDER — HEPARIN SODIUM (PORCINE) 5000 UNIT/ML IJ SOLN
5000.0000 [IU] | Freq: Three times a day (TID) | INTRAMUSCULAR | Status: DC
  Administered 2018-02-18 – 2018-02-22 (×11): 5000 [IU] via SUBCUTANEOUS
  Filled 2018-02-18 (×10): qty 1

## 2018-02-18 MED ORDER — POLYETHYLENE GLYCOL 3350 17 G PO PACK: 17.0000 g | PACK | Freq: Every day | ORAL | Status: DC | PRN

## 2018-02-18 MED ORDER — ONDANSETRON HCL 4 MG PO TABS: 4.0000 mg | ORAL_TABLET | Freq: Four times a day (QID) | ORAL | Status: DC | PRN

## 2018-02-18 MED ORDER — SENNA 8.6 MG PO TABS
1.0000 | ORAL_TABLET | Freq: Two times a day (BID) | ORAL | Status: DC
  Administered 2018-02-18 – 2018-02-22 (×6): 8.6 mg via ORAL
  Filled 2018-02-18 (×6): qty 1

## 2018-02-18 MED ORDER — OXYBUTYNIN CHLORIDE 5 MG PO TABS
5.0000 mg | ORAL_TABLET | Freq: Two times a day (BID) | ORAL | Status: DC
  Administered 2018-02-18 – 2018-02-22 (×8): 5 mg via ORAL
  Filled 2018-02-18 (×8): qty 1

## 2018-02-18 MED ORDER — COLLAGENASE 250 UNIT/GM EX OINT
TOPICAL_OINTMENT | Freq: Every day | CUTANEOUS | Status: DC
Start: 2018-02-18 — End: 2018-02-22
  Administered 2018-02-18 – 2018-02-22 (×3): via TOPICAL
  Filled 2018-02-18: qty 90

## 2018-02-18 MED ORDER — DOCUSATE SODIUM 100 MG PO CAPS
100.0000 mg | ORAL_CAPSULE | Freq: Two times a day (BID) | ORAL | Status: DC
  Administered 2018-02-18 – 2018-02-22 (×6): 100 mg via ORAL
  Filled 2018-02-18 (×7): qty 1

## 2018-02-18 MED ORDER — VANCOMYCIN HCL IN DEXTROSE 1-5 GM/200ML-% IV SOLN
1000.0000 mg | Freq: Once | INTRAVENOUS | Status: AC
  Administered 2018-02-18: 1000 mg via INTRAVENOUS
  Filled 2018-02-18: qty 200

## 2018-02-18 MED ORDER — ESCITALOPRAM OXALATE 10 MG PO TABS
10.0000 mg | ORAL_TABLET | Freq: Every day | ORAL | Status: DC
  Administered 2018-02-19 – 2018-02-22 (×4): 10 mg via ORAL
  Filled 2018-02-18 (×4): qty 1

## 2018-02-18 MED ORDER — PRO-STAT SUGAR FREE PO LIQD
30.0000 mL | Freq: Two times a day (BID) | ORAL | Status: DC
  Administered 2018-02-18 – 2018-02-22 (×6): 30 mL via ORAL
  Filled 2018-02-18 (×7): qty 30

## 2018-02-18 MED ORDER — GABAPENTIN 400 MG PO CAPS
400.0000 mg | ORAL_CAPSULE | Freq: Three times a day (TID) | ORAL | Status: DC
  Administered 2018-02-18 – 2018-02-22 (×11): 400 mg via ORAL
  Filled 2018-02-18 (×11): qty 1

## 2018-02-18 MED ORDER — SODIUM CHLORIDE 0.9 % IV BOLUS (SEPSIS): 500.0000 mL | Freq: Once | INTRAVENOUS | Status: DC

## 2018-02-18 MED ORDER — FLEET ENEMA 7-19 GM/118ML RE ENEM: 1.0000 | ENEMA | Freq: Once | RECTAL | Status: DC | PRN

## 2018-02-18 MED ORDER — INSULIN ASPART 100 UNIT/ML ~~LOC~~ SOLN
0.0000 [IU] | Freq: Three times a day (TID) | SUBCUTANEOUS | Status: DC
  Administered 2018-02-19 (×2): 2 [IU] via SUBCUTANEOUS
  Administered 2018-02-20: 1 [IU] via SUBCUTANEOUS
  Administered 2018-02-20: 2 [IU] via SUBCUTANEOUS
  Administered 2018-02-20: 1 [IU] via SUBCUTANEOUS
  Administered 2018-02-21 (×2): 2 [IU] via SUBCUTANEOUS

## 2018-02-18 MED ORDER — ACETAMINOPHEN 650 MG RE SUPP: 650.0000 mg | Freq: Four times a day (QID) | RECTAL | Status: DC | PRN

## 2018-02-18 NOTE — ED Notes (Signed)
Bed: OI51 Expected date:  Expected time:  Means of arrival:  Comments: 77 yo m ams

## 2018-02-18 NOTE — Care Management Note (Signed)
Case Management Note  Patient Details  Name: [name] MRN: [id number] Date of Birth: 15-Oct-1941  CM noted pt was advised with [facility] [place] RN PT. Contacted [facility] to advised that pt was in the ED and being admitted.  She will follow for needs.  Expected Discharge Date:  (unknown)               Expected Discharge Plan:  [place]  Discharge planning Services  CM Consult  Post Acute Care Choice:  Home Health Choice offered to:  Patient  HH Arranged:  RN, PT [facility] Agency:  [place]  Status of Service:  In process, will continue to follow  Rae Mar, RN 02/18/2018, 4:12 PM

## 2018-02-18 NOTE — H&P (Signed)
History and Physical    [name] FTD:[id number] DOB: 28-Apr-1941 DOA: 02/18/2018  PCP: Kathyrn Lass, MD  Patient coming from: Home  I have personally briefly reviewed patient's old medical records in [place]  Chief Complaint: Confusion/not feeling well  HPI: [name] is a 77 y.o. male with medical history significant of anxiety/depression, diabetes, hyperlipidemia, chronic venous stasis ulcers bilateral lower extremity history of prostate cancer, urinary incontinence presented to the ED with a 1-2-day history of confusion, moaning, refusing to take his meds per the wife for the past 24 hours, complaining of increasing pain.  Patient's wife stated that patient refused to take his medications yesterday and refused for her to change him and due to his confusion she called 911 and brought him to the hospital.  Patient and wife are giving most of the history. Patient does endorse some subjective fevers, little chronic shortness of breath, episode of nausea.  Patient denies any chills, no emesis, no chest pain, no abdominal pain, no diarrhea, no constipation, no melena, no hematemesis, no hematochezia.  Patient denies any dysuria.  It is noted per wife that patient was being treated with doxycycline for right lower extremity cellulitis.  Patient denies any syncope however wife states passing out.  She felt patient was close to passing out.  No asymmetric weakness or numbness.  Per wife patient is mostly wheelchair-bound.  ED Course: Patient was seen in the ED, comprehensive metabolic profile done had a glucose of 158, BUN of 48, creatinine of 2.4, albumin of 2.7 otherwise was within normal limits.  CBC had a white count of 19.1 hemoglobin of 9.8 otherwise was within normal limits.  Chest x-ray which was done showed unchanged small right pleural effusion, pulmonary vascular congestion and suspected mild interstitial edema.  The patient was given a bolus of IV fluids.  Patient also given  IV vancomycin.  Hospitalist were called to admit the patient for further evaluation and management.  Review of Systems: As per HPI otherwise 10 point review of systems negative.   Past Medical History:  Diagnosis Date  . CAP (community acquired pneumonia)   . CHF (congestive heart failure) ([place])   . Diabetes ([place])   . Hyperlipidemia   . Hypertension   . Hypotension   . Pleural effusion on right   . Prostate cancer ([place])   . Ulcers of both lower extremities ([place])   . Venous insufficiency (chronic) (peripheral)     History reviewed. No pertinent surgical history.   reports that  has never smoked. he has never used smokeless tobacco. He reports that he drinks about 0.6 oz of alcohol per week. He reports that he does not use drugs.  Allergies  Allergen Reactions  . Penicillins     Has patient had a PCN reaction causing immediate rash, facial/tongue/throat swelling, SOB or lightheadedness with hypotension: Unknown Has patient had a PCN reaction causing severe rash involving mucus membranes or skin necrosis:  /Unknown Has patient had a PCN reaction that required hospitalization: Unknown Has patient had a PCN reaction occurring within the last 10 years: Unknown If all of the above answers are "NO", then may proceed with Cephalosporin use.   . Sulfa Antibiotics     Family History  Problem Relation Age of Onset  . Coronary artery disease Father   . Coronary artery disease Mother   . Dementia Mother   . Coronary artery disease Sister    Mother  deceased from Alzheimer's dementia and complications from a  hip fracture.  Father deceased from coronary artery disease.  Prior to Admission medications   Medication Sig Start Date End Date Taking? Authorizing Provider  acetaminophen (TYLENOL) 500 MG tablet Take 1 tablet (500 mg total) by mouth every 6 (six) hours as needed for mild pain or moderate pain. 12/12/17  Yes Hongalgi, Lenis Dickinson, MD  buPROPion (BUDEPRION XL) 300 MG 24 hr tablet Take  300 mg by mouth daily.     Yes [provider]  doxycycline (VIBRAMYCIN) 100 MG capsule Take 100 mg by mouth 2 (two) times daily. For 10 days 02/11/18  Yes [provider]  escitalopram (LEXAPRO) 10 MG tablet Take 10 mg by mouth daily.     Yes [provider]  furosemide (LASIX) 40 MG tablet Take 40 mg by mouth daily. 02/01/18  Yes [provider]  gabapentin (NEURONTIN) 400 MG capsule Take 1 capsule (400 mg total) by mouth 3 (three) times daily. 12/12/17  Yes Hongalgi, Lenis Dickinson, MD  losartan (COZAAR) 50 MG tablet Take 50 mg by mouth daily.     Yes [provider]  mupirocin ointment (BACTROBAN) 2 % APPLY TO AFFECTED AREA EVERY DAY 02/07/18  Yes [provider]  oxybutynin (DITROPAN) 5 MG tablet Take 5 mg by mouth 2 (two) times daily. 01/22/18  Yes [provider]  SANTYL ointment APPLY TO AFFECTED AREA EVERY DAY 02/08/18  Yes [provider]  Silodosin (RAPAFLO) 8 MG CAPS Take 1 capsule by mouth daily.     Yes [provider]  spironolactone (ALDACTONE) 25 MG tablet Take 50 mg by mouth daily. 01/28/18  Yes [provider]  Amino Acids-Protein Hydrolys (FEEDING SUPPLEMENT, PRO-STAT SUGAR FREE 64,) LIQD Take 30 mLs by mouth 2 (two) times daily. Patient not taking: Reported on 02/18/2018 12/12/17   Modena Jansky, MD  furosemide (LASIX) 20 MG tablet Take 1 tablet (20 mg total) by mouth daily. Patient not taking: Reported on 02/18/2018 12/13/17   Modena Jansky, MD  insulin aspart (NOVOLOG) 100 UNIT/ML injection Inject 0-15 Units into the skin 3 (three) times daily with meals. CBG < 70: implement hypoglycemia protocol CBG 70 - 120: 0 units CBG 121 - 150: 2 units CBG 151 - 200: 3 units CBG 201 - 250: 5 units CBG 251 - 300: 8 units CBG 301 - 350: 11 units CBG 351 - 400: 15 units CBG > 400: call MD. Patient not taking: Reported on 02/18/2018 12/12/17   Modena Jansky, MD  loperamide (IMODIUM) 2 MG capsule Take 1 capsule  (2 mg total) by mouth 2 (two) times daily as needed for diarrhea or loose stools. Patient not taking: Reported on 02/18/2018 12/12/17   Modena Jansky, MD    Physical Exam: Vitals:   02/18/18 1116 02/18/18 1238 02/18/18 1341 02/18/18 1505  BP:  132/70 (!) 112/56 (!) 112/56  Pulse:  79 78 77  Resp:  18 18 18   Temp:      TempSrc:      SpO2:  98% 96% 96%  Weight: 113.4 kg (250 lb)     Height: 6\' 2"  (1.88 m)       Constitutional: NAD, calm, comfortable.  Strong smell of urine Vitals:   02/18/18 1116 02/18/18 1238 02/18/18 1341 02/18/18 1505  BP:  132/70 (!) 112/56 (!) 112/56  Pulse:  79 78 77  Resp:  18 18 18   Temp:      TempSrc:      SpO2:  98% 96% 96%  Weight: 113.4 kg (250 lb)     Height: 6\' 2"  (1.88 m)      Eyes: PERRLA, lids and conjunctivae normal ENMT: Mucous membranes are dry. Posterior pharynx clear of any exudate or lesions.Poor dentition.  Neck: normal, supple, no masses, no thyromegaly Respiratory: clear to auscultation bilaterally, decreased breath sounds in the bases.  No wheezing.  No crackles.  No use of accessory muscles of respiration.  Cardiovascular: Regular rate and rhythm, no murmurs / rubs / gallops. No extremity edema. 2+ pedal pulses. No carotid bruits.  Abdomen: no tenderness, no masses palpated. No hepatosplenomegaly. Bowel sounds positive.  Left flank erythematous with some slight warmth, nontender to palpation. Musculoskeletal: no clubbing / cyanosis. No joint deformity upper and lower extremities. Good ROM, no contractures. Normal muscle tone.  Skin: Left lower extremity wrapped in a bandage.  Right lower extremity with chronic venous stasis changes and some erythema noted, slight warmth, some tenderness to palpation.  Neurologic: CN 2-12 grossly intact. Sensation intact, DTR normal. Strength 3-4/5 in bilateral lower extremities.  4/5 bilateral upper extremities.   Psychiatric: Normal judgment and insight. Alert and oriented x 3. Normal mood.   Labs  on Admission: I have personally reviewed following labs and imaging studies  CBC: Recent Labs  Lab 02/18/18 1226  WBC 19.1*  NEUTROABS 17.4*  HGB 9.8*  HCT 30.1*  MCV 87.8  PLT 500   Basic Metabolic Panel: Recent Labs  Lab 02/18/18 1226  NA 137  K 4.4  CL 104  CO2 23  GLUCOSE 158*  BUN 48*  CREATININE 2.40*  CALCIUM 8.2*   GFR: Estimated Creatinine Clearance: 36.2 mL/min (A) (by C-G formula based on SCr of 2.4 mg/dL (H)). Liver Function Tests: Recent Labs  Lab 02/18/18 1226  AST 37  ALT 21  ALKPHOS 95  BILITOT 1.0  PROT 7.0  ALBUMIN 2.7*   No results for input(s): LIPASE, AMYLASE in the last 168 hours. No results for input(s): AMMONIA in the last 168 hours. Coagulation Profile: No results for input(s): INR, PROTIME in the last 168 hours. Cardiac Enzymes: No results for input(s): CKTOTAL, CKMB, CKMBINDEX, TROPONINI in the last 168 hours. BNP (last 3 results) No results for input(s): PROBNP in the last 8760 hours. HbA1C: No results for input(s): HGBA1C in the last 72 hours. CBG: No results for input(s): GLUCAP in the last 168 hours. Lipid Profile: No results for input(s): CHOL, HDL, LDLCALC, TRIG, CHOLHDL, LDLDIRECT in the last 72 hours. Thyroid Function Tests: No results for input(s): TSH, T4TOTAL, FREET4, T3FREE, THYROIDAB in the last 72 hours. Anemia Panel: No results for input(s): VITAMINB12, FOLATE, FERRITIN, TIBC, IRON, RETICCTPCT in the last 72 hours. Urine analysis:    Component Value Date/Time   COLORURINE YELLOW 12/06/2017 1146   APPEARANCEUR CLEAR 12/06/2017 1146   LABSPEC 1.013 12/06/2017 1146   PHURINE 5.0 12/06/2017 1146   GLUCOSEU 50 (A) 12/06/2017 1146   HGBUR MODERATE (A) 12/06/2017 1146   BILIRUBINUR NEGATIVE 12/06/2017 1146   KETONESUR NEGATIVE 12/06/2017 1146   PROTEINUR 100 (A) 12/06/2017 1146   UROBILINOGEN 0.2 06/09/2011 0522   NITRITE NEGATIVE 12/06/2017 1146   LEUKOCYTESUR NEGATIVE 12/06/2017 1146    Radiological Exams  on Admission: Dg Chest Port 1 View  Result Date: 02/18/2018 CLINICAL DATA:  Altered mental status. EXAM: PORTABLE CHEST 1 VIEW COMPARISON:  01/22/2018 FINDINGS: The cardiac silhouette remains enlarged. Aortic atherosclerosis is noted. The lungs are mildly hypoinflated. There is persistent mild pulmonary vascular congestion and interstitial prominence diffusely, not significantly  changed. A small right pleural effusion is unchanged. No pneumothorax is identified. IMPRESSION: Unchanged small right pleural effusion, pulmonary vascular congestion, and suspected mild interstitial edema. Electronically Signed   By: Logan Bores M.D.   On: 02/18/2018 12:44    EKG: Independently reviewed.  Right bundle branch block.  Left anterior fascicular block.  Unchanged from Previous EKG.  Assessment/Plan Principal Problem:   Acute encephalopathy Active Problems:   Cellulitis of leg, right   Cellulitis of flank   Cellulitis of perineum   Type 2 diabetes mellitus with hyperlipidemia (HCC)   Hyperlipidemia   OBESITY, MORBID   ANXIETY DEPRESSION   HYPERTENSION, BENIGN ESSENTIAL   VENOUS STASIS ULCER   Venous (peripheral) insufficiency   Decubitus ulcer of heel   Backache   INCONTINENCE, URGE   Pleural effusion on right   Acute kidney injury superimposed on CKD (HCC)   Dehydration   Leukocytosis   #1 acute encephalopathy Questionable etiology.  Concern for infectious etiology.  Patient was being treated for right lower extremity cellulitis prior to admission which is noted.  Patient with a erythematous region on the left flank concerning for possible cellulitis.  Patient with erythema in his perineal region likely secondary to cellulitis versus a contact dermatitis from urinary incontinence as patient does wear a diaper.  Patient with no focal neurological deficits.  Patient was being treated with doxycycline for right lower extremity cellulitis prior to admission.  Patient noted to have a leukocytosis with  a white count of 19,000 and in acute on chronic kidney disease.  Patient with subjective fevers on his history.  Will check a CT head.  Check a UA with cultures and sensitivities.  Check blood cultures x2.  Chest x-ray negative for any acute infiltrate.  Patient looks clinically dry on examination.  Hydrate with IV fluids.  Placed empirically on IV vancomycin and IV Rocephin.  Follow.  2.  Right lower extremity cellulitis/possible left flank cellulitis/possible perineal cellulitis versus contact dermatitis from incontinence to urine Cultures x2 have been ordered.  Place empirically on IV vancomycin and IV Rocephin.  Follow.  3.  Dehydration IV fluids.  4.  Acute on chronic kidney disease stage III Likely secondary to prerenal azotemia in the setting of ARB and diuretics.  Patient with a creatinine of 2.4 on presentation and baseline creatinine seems to range anywhere from 1.6-1.9.  Check a fractional excretion of sodium.  Check a renal ultrasound.  Hold diuretics.  Place Foley catheter.  Strict I's and O's.  Daily weights.  Hydrated with IV fluids and follow.  5.  Leukocytosis Likely secondary to cellulitis.  Pan culture.  Placed empirically on IV Rocephin and IV vancomycin.  6.  Hypertension Blood pressure borderline.  Hold antihypertensive medications.  IV fluids.  7.  Diabetes mellitus type II Check a hemoglobin A1c.  Place on sliding scale insulin.  8.  Chronic venous stasis ulcers Wound care consultation.  9.  Depression/anxiety Continue home regimen of Wellbutrin, Lexapro.  #10 morbid obesity  11.  Debility Per patient and wife patient is mostly wheelchair-bound.  PT/OT.  DVT prophylaxis: Heparin Code Status: Full Family Communication: Updated patient and wife at bedside. Disposition Plan: The PT evaluation likely home with home health versus skilled nursing facility. Consults called: None Admission status: Admit to telemetry.   Irine Seal MD [facility] Pager [phone]  If 7PM-7AM, please contact night-coverage [email or web address] Password [facility]  02/18/2018, 5:24 PM

## 2018-02-18 NOTE — ED Provider Notes (Signed)
[facility] DEPT Provider Note   CSN: [id number] Arrival date & time: 02/18/18  1058     History   Chief Complaint Chief Complaint  Patient presents with  . Altered Mental Status    HPI [name] is a 77 y.o. male.  HPI  Patient presents with his wife who provides the HPI. Patient is awake and alert, but withdrawn however the patient does.  Concur with the history provided by his wife. He is currently receiving therapy for cellulitis of the left lower extremity, taking doxycycline. However, over the past 2 days the patient has been confused, weak, anorexic, all in a manner atypical for him. He has multiple medical issues including pneumonia 2 months ago, requiring rehabilitation stay. Patient is also effectively paraplegic, has nonhealing ulceration on the left heel. Wife notes that since this illness began 2 days ago, patient has been eating, drinking less, has been getting worse in spite of attempting to take his home medication including doxycycline. They deny fever, vomiting, diarrhea. Patient notes that he has also developed a new erythematous lesion on his left side.   Past Medical History:  Diagnosis Date  . CAP (community acquired pneumonia)   . CHF (congestive heart failure) ([place])   . Diabetes ([place])   . Hyperlipidemia   . Hypertension   . Hypotension   . Pleural effusion on right   . Prostate cancer ([place])   . Ulcers of both lower extremities ([place])   . Venous insufficiency (chronic) (peripheral)     Patient Active Problem List   Diagnosis Date Noted  . ARF (acute renal failure) ([place])   . CAP (community acquired pneumonia) 12/06/2017  . Dyspnea 12/06/2017  . Acute heart failure ([place]) 12/06/2017  . Pleural effusion on right 12/06/2017  . Diabetes ([place])   . WOUND, LEG 01/01/2011  . [place] SHOULDER REGION 11/23/2010  . Decubitus ulcer of heel 11/16/2010  . ADENOCARCINOMA, PROSTATE 09/13/2010  .  DIABETES MELLITUS, TYPE II, ON INSULIN 09/13/2010  . HYPERLIPIDEMIA 09/13/2010  . OBESITY, MORBID 09/13/2010  . ANXIETY DEPRESSION 09/13/2010  . HYPERTENSION, BENIGN ESSENTIAL 09/13/2010  . VENOUS STASIS ULCER 09/13/2010  . Venous (peripheral) insufficiency 09/13/2010  . DEGENERATIVE JOINT DISEASE, HIPS 09/13/2010  . BACK PAIN, CHRONIC 09/13/2010  . GAIT DISTURBANCE 09/13/2010  . INCONTINENCE, URGE 09/13/2010  . HIP REPLACEMENT, BILATERAL, HX OF 09/13/2010    History reviewed. No pertinent surgical history.     Home Medications    Prior to Admission medications   Medication Sig Start Date End Date Taking? Authorizing Provider  acetaminophen (TYLENOL) 500 MG tablet Take 1 tablet (500 mg total) by mouth every 6 (six) hours as needed for mild pain or moderate pain. 12/12/17   Hongalgi, Lenis Dickinson, MD  Amino Acids-Protein Hydrolys (FEEDING SUPPLEMENT, PRO-STAT SUGAR FREE 64,) LIQD Take 30 mLs by mouth 2 (two) times daily. 12/12/17   Hongalgi, Lenis Dickinson, MD  aspirin 325 MG tablet Take 325 mg by mouth daily.      [provider]  atorvastatin (LIPITOR) 80 MG tablet Take 80 mg by mouth at bedtime.      [provider]  bismuth subsalicylate (PEPTO BISMOL) 262 MG/15ML suspension Take 30 mLs by mouth every 6 (six) hours as needed for indigestion.    [provider]  buPROPion (BUDEPRION XL) 300 MG 24 hr tablet Take 300 mg by mouth daily.      [provider]  CALCIUM-VITAMIN D PO Take 1 tablet by  mouth daily.    [provider]  doxycycline (VIBRAMYCIN) 100 MG capsule Take 100 mg by mouth 2 (two) times daily. For 10 days 02/11/18   [provider]  escitalopram (LEXAPRO) 10 MG tablet Take 10 mg by mouth daily.      [provider]  furosemide (LASIX) 20 MG tablet Take 1 tablet (20 mg total) by mouth daily. 12/13/17   Hongalgi, Lenis Dickinson, MD  furosemide (LASIX) 40 MG tablet Take 40 mg by mouth daily. 02/01/18   [provider]    gabapentin (NEURONTIN) 400 MG capsule Take 1 capsule (400 mg total) by mouth 3 (three) times daily. 12/12/17   Hongalgi, Lenis Dickinson, MD  hydrOXYzine (ATARAX) 25 MG tablet Take 25 mg by mouth at bedtime as needed. For itchiness.     [provider]  insulin aspart (NOVOLOG) 100 UNIT/ML injection Inject 0-15 Units into the skin 3 (three) times daily with meals. CBG < 70: implement hypoglycemia protocol CBG 70 - 120: 0 units CBG 121 - 150: 2 units CBG 151 - 200: 3 units CBG 201 - 250: 5 units CBG 251 - 300: 8 units CBG 301 - 350: 11 units CBG 351 - 400: 15 units CBG > 400: call MD. 12/12/17   Modena Jansky, MD  loperamide (IMODIUM) 2 MG capsule Take 1 capsule (2 mg total) by mouth 2 (two) times daily as needed for diarrhea or loose stools. 12/12/17   Hongalgi, Lenis Dickinson, MD  losartan (COZAAR) 50 MG tablet Take 50 mg by mouth daily.      [provider]  mupirocin ointment (BACTROBAN) 2 % APPLY TO AFFECTED AREA EVERY DAY 02/07/18   [provider]  oxybutynin (DITROPAN) 5 MG tablet Take 5 mg by mouth 2 (two) times daily. 01/22/18   [provider]  SANTYL ointment APPLY TO AFFECTED AREA EVERY DAY 02/08/18   [provider]  Silodosin (RAPAFLO) 8 MG CAPS Take 1 capsule by mouth daily.      [provider]  spironolactone (ALDACTONE) 25 MG tablet Take 50 mg by mouth daily. 01/28/18   [provider]    Family History Family History  Problem Relation Age of Onset  . Coronary artery disease Father   . Coronary artery disease Mother   . Dementia Mother   . Coronary artery disease Sister     Social History Social History   Tobacco Use  . Smoking status: Never Smoker  . Smokeless tobacco: Never Used  Substance Use Topics  . Alcohol use: Yes    Alcohol/week: 0.6 oz    Types: 1 Cans of beer per week    Frequency: Never  . Drug use: No     Allergies   Penicillins   Review of Systems Review of Systems  Constitutional:       Per  HPI, otherwise negative  HENT:       Per HPI, otherwise negative  Respiratory:       Per HPI, otherwise negative  Cardiovascular:       Per HPI, otherwise negative  Gastrointestinal: Negative for vomiting.  Endocrine:       Negative aside from HPI  Genitourinary:       Neg aside from HPI   Musculoskeletal:       Per HPI, otherwise negative  Skin: Positive for color change and wound.  Allergic/Immunologic: Negative for immunocompromised state.  Neurological: Negative for syncope.  Psychiatric/Behavioral: Positive for decreased concentration, dysphoric mood and sleep disturbance.  Physical Exam Updated Vital Signs BP (!) 112/56   Pulse 77   Temp 98.4 F (36.9 C) (Oral)   Resp 18   Ht 6\' 2"  (1.88 m)   Wt 113.4 kg (250 lb)   SpO2 96%   BMI 32.10 kg/m   Physical Exam  Constitutional: He is oriented to person, place, and time. He appears well-developed. No distress.  Large but sickly appearing elderly male he is surgical since briefly, allows his wife to provide much of the HPI.  HENT:  Head: Normocephalic and atraumatic.  Eyes: Conjunctivae and EOM are normal.  Cardiovascular: Normal rate and regular rhythm.  Pulmonary/Chest: Effort normal. No stridor. No respiratory distress.  Abdominal: He exhibits no distension. There is no tenderness.  Musculoskeletal: He exhibits no edema.       Feet:  Neurological: He is alert and oriented to person, place, and time. He displays atrophy. He displays no tremor. He exhibits abnormal muscle tone. He displays no seizure activity.  Skin: Skin is warm and dry.     Psychiatric: He has a normal mood and affect.  Nursing note and vitals reviewed.    ED Treatments / Results  Labs (all labs ordered are listed, but only abnormal results are displayed) Labs Reviewed  COMPREHENSIVE METABOLIC PANEL - Abnormal; Notable for the following components:      Result Value   Glucose, Bld 158 (*)    BUN 48 (*)    Creatinine, Ser 2.40 (*)      Calcium 8.2 (*)    Albumin 2.7 (*)    GFR calc non Af Amer 25 (*)    GFR calc Af Amer 29 (*)    All other components within normal limits  CBC WITH DIFFERENTIAL/PLATELET - Abnormal; Notable for the following components:   WBC 19.1 (*)    RBC 3.43 (*)    Hemoglobin 9.8 (*)    HCT 30.1 (*)    Neutro Abs 17.4 (*)    All other components within normal limits  URINALYSIS, COMPLETE (UACMP) WITH MICROSCOPIC  CBG MONITORING, ED    EKG  EKG Interpretation  Date/Time:  Monday February 18 2018 12:46:11 EDT Ventricular Rate:  84 PR Interval:    QRS Duration: 156 QT Interval:  434 QTC Calculation: 514 R Axis:   -104 Text Interpretation:  Sinus rhythm RBBB and LAFB No significant change since last tracing Confirmed by Duffy Bruce [id number]) on 02/18/2018 3:12:35 PM       Radiology Dg Chest Port 1 View  Result Date: 02/18/2018 CLINICAL DATA:  Altered mental status. EXAM: PORTABLE CHEST 1 VIEW COMPARISON:  01/22/2018 FINDINGS: The cardiac silhouette remains enlarged. Aortic atherosclerosis is noted. The lungs are mildly hypoinflated. There is persistent mild pulmonary vascular congestion and interstitial prominence diffusely, not significantly changed. A small right pleural effusion is unchanged. No pneumothorax is identified. IMPRESSION: Unchanged small right pleural effusion, pulmonary vascular congestion, and suspected mild interstitial edema. Electronically Signed   By: Logan Bores M.D.   On: 02/18/2018 12:44    Procedures Procedures (including critical care time)  Medications Ordered in ED Medications  sodium chloride 0.9 % bolus 1,000 mL (0 mLs Intravenous Stopped 02/18/18 1546)    And  0.9 %  sodium chloride infusion (not administered)  vancomycin (VANCOCIN) IVPB 1000 mg/200 mL premix (0 mg Intravenous Stopped 02/18/18 1546)     Initial Impression / Assessment and Plan / ED Course  I have reviewed the triage vital signs and the nursing notes.  Pertinent labs &  imaging  results that were available during my care of the patient were reviewed by me and considered in my medical decision making (see chart for details).  Patient in similar condition, answers questions briefly, appropriately. He remains hemodynamically unremarkable. Initial labs notable for acute kidney injury, and with his cellulitis, patient is receiving fluid resuscitation, will receive vancomycin as well. Though it is unclear if the patient has been taking his doxycycline entirely as directed, with new cellulitis, altered mental status, acute kidney injury, he will require admission for further evaluation and management.  3:52 PM Patient in similar condition, no new complaints.  Wife notes that he is much more calm than on arrival.   Final Clinical Impressions(s) / ED Diagnoses  Cellulitis Altered mental status Acute kidney injury   Carmin Muskrat, MD 02/18/18 1559

## 2018-02-18 NOTE — ED Triage Notes (Signed)
EMS reports from home, AMS decline over past few weeks with increased weakness, at rehab for PNA 5 weeks ago and since has not had same care at home, cellulitis noted bilateral lower extremities, Pt non compliant yesterday with wife with meds and care. Hx of diabetes  BP 138/77 HR 84 Resp 20 Sp02 94 RA CBG 234  20ga Left hand

## 2018-02-18 NOTE — Progress Notes (Signed)
Pharmacy Antibiotic Note  [name] is a 77 y.o. male admitted on 02/18/2018 with cellulitis.  He has been receiving doxycycline as an outpatient with new cellulitis, altered mental status and acute kidney injury.  Pharmacy has been consulted for Vancomycin dosing.  Received 1st doses in ED.  02/18/2018:  Afebrile  Leukocytosis- WBC 19.1  AKI- Scr 2.4, est NCrCl ~49ml/min  Plan: Vancomycin 1750mg  IV q48 (target AUC 400-500)-->will start now so patient gets adequate loading dose Rocephin 2gm IV q24h per MD Daily Scr Monitor renal function and cx data   Height: 6\' 2"  (188 cm) Weight: 250 lb (113.4 kg) IBW/kg (Calculated) : 82.2  Temp (24hrs), Avg:98.4 F (36.9 C), Min:98.4 F (36.9 C), Max:98.4 F (36.9 C)  Recent Labs  Lab 02/18/18 1226  WBC 19.1*  CREATININE 2.40*    Estimated Creatinine Clearance: 36.2 mL/min (A) (by C-G formula based on SCr of 2.4 mg/dL (H)).    Allergies  Allergen Reactions  . Penicillins     Has patient had a PCN reaction causing immediate rash, facial/tongue/throat swelling, SOB or lightheadedness with hypotension: Unknown Has patient had a PCN reaction causing severe rash involving mucus membranes or skin necrosis:  /Unknown Has patient had a PCN reaction that required hospitalization: Unknown Has patient had a PCN reaction occurring within the last 10 years: Unknown If all of the above answers are "NO", then may proceed with Cephalosporin use.   . Sulfa Antibiotics     Antimicrobials this admission: 3/11 Rocephin >>  3/11 Vancomycin >>   Dose adjustments this admission:  Microbiology results: 3/11 BCx:  3/11 UCx:    Thank you for allowing pharmacy to be a part of this patient's care.  Biagio Borg 02/18/2018 5:33 PM

## 2018-02-19 DIAGNOSIS — D649 Anemia, unspecified: Secondary | ICD-10-CM | POA: Diagnosis present

## 2018-02-19 DIAGNOSIS — R32 Unspecified urinary incontinence: Secondary | ICD-10-CM

## 2018-02-19 DIAGNOSIS — D509 Iron deficiency anemia, unspecified: Secondary | ICD-10-CM

## 2018-02-19 DIAGNOSIS — L258 Unspecified contact dermatitis due to other agents: Secondary | ICD-10-CM | POA: Diagnosis present

## 2018-02-19 LAB — SODIUM, URINE, RANDOM: SODIUM UR: 18 mmol/L

## 2018-02-19 LAB — COMPREHENSIVE METABOLIC PANEL
ALT: 17 U/L (ref 17–63)
AST: 27 U/L (ref 15–41)
Albumin: 2.3 g/dL — ABNORMAL LOW (ref 3.5–5.0)
Alkaline Phosphatase: 82 U/L (ref 38–126)
Anion gap: 9 (ref 5–15)
BUN: 48 mg/dL — AB (ref 6–20)
CO2: 21 mmol/L — AB (ref 22–32)
Calcium: 8.1 mg/dL — ABNORMAL LOW (ref 8.9–10.3)
Chloride: 105 mmol/L (ref 101–111)
Creatinine, Ser: 2.24 mg/dL — ABNORMAL HIGH (ref 0.61–1.24)
GFR, EST AFRICAN AMERICAN: 31 mL/min — AB (ref >60)
GFR, EST NON AFRICAN AMERICAN: 27 mL/min — AB (ref >60)
Glucose, Bld: 119 mg/dL — ABNORMAL HIGH (ref 65–99)
POTASSIUM: 3.8 mmol/L (ref 3.5–5.1)
SODIUM: 135 mmol/L (ref 135–145)
Total Bilirubin: 0.5 mg/dL (ref 0.3–1.2)
Total Protein: 6.1 g/dL — ABNORMAL LOW (ref 6.5–8.1)

## 2018-02-19 LAB — FERRITIN: Ferritin: 177 ng/mL (ref 24–336)

## 2018-02-19 LAB — CBC
HEMATOCRIT: 26.9 % — AB (ref 39.0–52.0)
Hemoglobin: 8.9 g/dL — ABNORMAL LOW (ref 13.0–17.0)
MCH: 29.4 pg (ref 26.0–34.0)
MCHC: 33.1 g/dL (ref 30.0–36.0)
MCV: 88.8 fL (ref 78.0–100.0)
Platelets: 233 10*3/uL (ref 150–400)
RBC: 3.03 MIL/uL — AB (ref 4.22–5.81)
RDW: 15.1 % (ref 11.5–15.5)
WBC: 14 10*3/uL — AB (ref 4.0–10.5)

## 2018-02-19 LAB — RETICULOCYTES
RBC.: 3.04 MIL/uL — ABNORMAL LOW (ref 4.22–5.81)
Retic Count, Absolute: 12.2 K/uL — ABNORMAL LOW (ref 19.0–186.0)
Retic Ct Pct: 0.4 % (ref 0.4–3.1)

## 2018-02-19 LAB — IRON AND TIBC
Iron: 14 ug/dL — ABNORMAL LOW (ref 45–182)
Saturation Ratios: 9 % — ABNORMAL LOW (ref 17.9–39.5)
TIBC: 161 ug/dL — ABNORMAL LOW (ref 250–450)
UIBC: 147 ug/dL

## 2018-02-19 LAB — FOLATE: Folate: 9.1 ng/mL

## 2018-02-19 LAB — PROTIME-INR
INR: 1.42
Prothrombin Time: 17.2 seconds — ABNORMAL HIGH (ref 11.4–15.2)

## 2018-02-19 LAB — GLUCOSE, CAPILLARY
Glucose-Capillary: 110 mg/dL — ABNORMAL HIGH (ref 65–99)
Glucose-Capillary: 158 mg/dL — ABNORMAL HIGH (ref 65–99)
Glucose-Capillary: 173 mg/dL — ABNORMAL HIGH (ref 65–99)
Glucose-Capillary: 167 mg/dL — ABNORMAL HIGH (ref 65–99)

## 2018-02-19 LAB — HEMOGLOBIN A1C
HEMOGLOBIN A1C: 6.4 % — AB (ref 4.8–5.6)
Mean Plasma Glucose: 136.98 mg/dL

## 2018-02-19 LAB — HIV ANTIBODY (ROUTINE TESTING W REFLEX): HIV Screen 4th Generation wRfx: NONREACTIVE

## 2018-02-19 LAB — CREATININE, URINE, RANDOM: Creatinine, Urine: 97.84 mg/dL

## 2018-02-19 LAB — VITAMIN B12: Vitamin B-12: 410 pg/mL (ref 180–914)

## 2018-02-19 MED ORDER — JUVEN PO PACK
1.0000 | PACK | Freq: Two times a day (BID) | ORAL | Status: DC
  Administered 2018-02-20 – 2018-02-22 (×2): 1 via ORAL
  Filled 2018-02-19 (×7): qty 1

## 2018-02-19 MED ORDER — FLUCONAZOLE IN SODIUM CHLORIDE 200-0.9 MG/100ML-% IV SOLN
200.0000 mg | Freq: Once | INTRAVENOUS | Status: AC
  Administered 2018-02-19: 200 mg via INTRAVENOUS
  Filled 2018-02-19: qty 100

## 2018-02-19 MED ORDER — MAGNESIUM SULFATE 4 GM/100ML IV SOLN
4.0000 g | Freq: Once | INTRAVENOUS | Status: AC
  Administered 2018-02-19: 4 g via INTRAVENOUS
  Filled 2018-02-19: qty 100

## 2018-02-19 MED ORDER — SODIUM CHLORIDE 0.9 % IV SOLN
510.0000 mg | Freq: Once | INTRAVENOUS | Status: AC
  Administered 2018-02-19: 510 mg via INTRAVENOUS
  Filled 2018-02-19: qty 17

## 2018-02-19 MED ORDER — FLUCONAZOLE 100 MG PO TABS
100.0000 mg | ORAL_TABLET | Freq: Every day | ORAL | Status: DC
  Administered 2018-02-20 – 2018-02-22 (×3): 100 mg via ORAL
  Filled 2018-02-19 (×3): qty 1

## 2018-02-19 MED ORDER — SODIUM CHLORIDE 0.9 % IV SOLN
INTRAVENOUS | Status: DC
  Administered 2018-02-19 – 2018-02-20 (×3): via INTRAVENOUS

## 2018-02-19 NOTE — Consult Note (Signed)
[place] Nurse wound consult note Reason for Consult: venous stasis Patient with extensive history of venous disease per patient report for over 30 years. Bilateral LE edema with hemosiderin staining, scarring evidence of previous healed ulcerations, and venous dermatitis Additionally he reports at home he wraps himself with Unna's boots, difficult to understand his ability to do this effectively. Lives with wife who apparently assist him.  He has been seen in the wound care center in the past.  He has been recommended to wear compression stockings and does so "intermittantly" per patient. He now has open area on his left heel that is most likely pressure related due to immobility.  Wound type: Stage 3 Pressure injury left heel Some slight weeping noted but not active on the LLE Pressure Injury POA: Yes Measurement:3xm z 2.5cm x 0.2cm  Wound HKV:QQVZD, pink, moist, non granular Drainage (amount, consistency, odor) moderate,serousangionous  Periwound: intact with maceration Dressing procedure/placement/frequency: Added absorptive antimicrobial dressing to the left heel wound and dry compression wraps. To be changed T/Th Needs HHRN to be in the home for assessment of the heel wound and management of LE edema or follow up in a wound care center of his choice.    Discussed POC with patient and bedside nurse.  Re consult if needed, will not follow at this time. Thanks  Kooper Godshall [employer], RN,CWOCN, CNS, [place] [phone])

## 2018-02-19 NOTE — Evaluation (Signed)
Occupational Therapy Evaluation Patient Details Name: [name] MRN: [id number] DOB: 12-23-1940 Today's Date: 02/19/2018    History of Present Illness [name] is a 77 y.o. male with medical history significant of anxiety/depression, diabetes, hyperlipidemia, chronic venous stasis ulcers bilateral lower extremity history of prostate cancer, urinary incontinence presented to the ED with a 1-2-day history of confusion, moaning, refusing to take his meds per the wife for the past 24 hours, complaining of increasing pain.  Patient's wife stated that patient refused to take his medications yesterday    Clinical Impression   Pt admitted with acute encephalopathy. Pt currently with functional limitations due to the deficits listed below (see OT Problem List).  Pt will benefit from skilled OT to increase their safety and independence with ADL and functional mobility for ADL to facilitate discharge to venue listed below.      Follow Up Recommendations  SNF    Equipment Recommendations  None recommended by OT    Recommendations for Other Services       Precautions / Restrictions Precautions Precautions: Fall Restrictions Weight Bearing Restrictions: (Pt is bed/chair bound per pt report)      Mobility Bed Mobility Overal bed mobility: Needs Assistance Bed Mobility: Supine to Sit;Sit to Supine     Supine to sit: Mod assist Sit to supine: +2 for physical assistance;+2 for safety/equipment;Mod assist      Transfers Overall transfer level: Needs assistance Equipment used: Rolling walker (2 wheeled) Transfers: Sit to/from Stand Sit to Stand: Max assist;+2 physical assistance;+2 safety/equipment;From elevated surface         General transfer comment: VC for hand placement as well as to push up tall    Balance                                           ADL either performed or assessed with clinical judgement   ADL Overall ADL's : Needs  assistance/impaired Eating/Feeding: Set up;Sitting   Grooming: Minimal assistance;Sitting   Upper Body Bathing: Minimal assistance;Sitting   Lower Body Bathing: Maximal assistance;Total assistance;Sit to/from stand;Cueing for sequencing;Cueing for compensatory techniques;Cueing for safety;+2 for safety/equipment   Upper Body Dressing : Minimal assistance;Sitting   Lower Body Dressing: Maximal assistance;Sit to/from stand;Cueing for safety;Cueing for sequencing;Cueing for compensatory techniques                 General ADL Comments: Performed sit to stand only 3 times. Pts R knee did buckle.  Pt told OT he was able to transition to his WC at home but was vague regarding technique he used.  Pt needed significant A and will need more A than wife is able to provide                  Pertinent Vitals/Pain Pain Assessment: 0-10 Pain Score: 4  Pain Location: right leg/knee Pain Descriptors / Indicators: Throbbing;Tender;Sore Pain Intervention(s): Limited activity within patient's tolerance;Monitored during session     Hand Dominance     Extremity/Trunk Assessment Upper Extremity Assessment Upper Extremity Assessment: Generalized weakness           Communication     Cognition Arousal/Alertness: Awake/alert Behavior During Therapy: WFL for tasks assessed/performed Overall Cognitive Status: Within Functional Limits for tasks assessed  Home Living Family/patient expects to be discharged to:: Skilled nursing facility(pt will need SNF)                                                 OT Problem List: Decreased strength;Decreased activity tolerance;Decreased safety awareness;Impaired balance (sitting and/or standing)      OT Treatment/Interventions: Self-care/ADL training;Patient/family education;DME and/or AE instruction    OT Goals(Current goals can be found in the care plan  section) Acute Rehab OT Goals Patient Stated Goal: be able to get in [facility] OT Goal Formulation: With patient Time For Goal Achievement: 03/05/18 Potential to Achieve Goals: Good  OT Frequency: Min 2X/week   Barriers to D/C:               AM-PAC PT "6 Clicks" Daily Activity     Outcome Measure Help from another person eating meals?: A Little Help from another person taking care of personal grooming?: A Little Help from another person toileting, which includes using toliet, bedpan, or urinal?: Total Help from another person bathing (including washing, rinsing, drying)?: A Lot Help from another person to put on and taking off regular upper body clothing?: A Lot Help from another person to put on and taking off regular lower body clothing?: Total 6 Click Score: 12   End of Session Equipment Utilized During Treatment: Rolling walker Nurse Communication: Mobility status;Need for lift equipment  Activity Tolerance: Patient tolerated treatment well Patient left: in bed;with call bell/phone within reach;with bed alarm set  OT Visit Diagnosis: Unsteadiness on feet (R26.81);Muscle weakness (generalized) (M62.81);Other abnormalities of gait and mobility (R26.89);History of falling (Z91.81)                Time: 1101-1140 OT Time Calculation (min): 39 min Charges:  OT General Charges $OT Visit: 1 Visit OT Evaluation $OT Eval Moderate Complexity: 1 Mod OT Treatments $Self Care/Home Management : 8-22 mins G-Codes:     Kari Baars, OT [phone]  Payton Mccallum D 02/19/2018, 12:06 PM

## 2018-02-19 NOTE — Progress Notes (Signed)
PROGRESS NOTE    [name]  CVE:[id number] DOB: 1941-04-12 DOA: 02/18/2018 PCP: Kathyrn Lass, MD    Brief Narrative:  Patient is a 77 year old gentleman history of anxiety/depression, diabetes, hyperlipidemia, chronic venous stasis ulcers bilateral lower extremity, history of prostate cancer, urinary incontinence who wears a diaper presented to the ED with a 1-2-day history of confusion, refusal to take his meds, increasing diffusing pain.  Patient admitted with a leukocytosis cellulitis of the right lower extremity and some dermatitis associated with urinary incontinence.  Patient placed empirically on IV vancomycin and IV Rocephin.  Patient pancultured.   Assessment & Plan:   Principal Problem:   Acute encephalopathy Active Problems:   Cellulitis of leg, right   Cellulitis of flank   Cellulitis of perineum   Type 2 diabetes mellitus with hyperlipidemia (HCC)   Hyperlipidemia   OBESITY, MORBID   ANXIETY DEPRESSION   HYPERTENSION, BENIGN ESSENTIAL   VENOUS STASIS ULCER   Venous (peripheral) insufficiency   Decubitus ulcer of heel   Backache   INCONTINENCE, URGE   Pleural effusion on right   Acute kidney injury superimposed on CKD (HCC)   Dehydration   Leukocytosis   Anemia  #1 acute encephalopathy Questionable etiology.  Concern for infectious etiology.  Patient was being treated for right lower extremity cellulitis prior to admission which is noted.  Patient with a erythematous region on the left flank concerning for possible cellulitis.  Patient with erythema in his perineal region likely secondary to cellulitis versus a contact dermatitis from urinary incontinence as patient does wear a diaper.  Clinically improving. Patient with no focal neurological deficits.  Patient was being treated with doxycycline for right lower extremity cellulitis prior to admission.  Patient noted to have a leukocytosis with a white count of 19,000 and in acute on chronic kidney disease on  admission.  Leukocytosis trending down. Patient with subjective fevers on his history.  CT head negative for any acute abnormalities.   Urinalysis nitrite negative leukocytes negative.  Blood cultures pending.  Chest x-ray negative for any infiltrate.  Continue hydration with IV fluids and monitor for volume overload.  Continue empiric IV vancomycin and IV Rocephin.  If continued clinical improvement could likely discontinue IV vancomycin tomorrow.    2.  Right lower extremity cellulitis/dermatitis associated with moisture from urinary incontinence to the perineal area and left flank.  Cultures x2 have been ordered.  Clinical improvement.  Continue empiric IV vancomycin and IV Rocephin.  If continued improvement could likely discontinue IV vancomycin tomorrow 02/20/2018.  Perineal area with dermatitis associated moisture from urinary incontinence with concerns for yeast infection.  Interdry. Place on Diflucan times 1 week.   3.  Dehydration IV fluids.  4.  Acute on chronic kidney disease stage III Likely secondary to prerenal azotemia in the setting of ARB and diuretics.  Patient with a creatinine of 2.4 on presentation and baseline creatinine seems to range anywhere from 1.6-1.9.  Creatinine trending down currently at 2.24.   Urine sodium is 18.  Urine creatinine is 97.84.  Diuretics on hold.  Renal ultrasound negative for hydronephrosis, cortical thinning.  Continue hydration with IV fluids and monitor closely for volume overload.  Strict I's and O's.  Daily weights.  Hydrated with IV fluids and follow.  5.  Leukocytosis Likely secondary to cellulitis.  Patient has been pancultured.   Leukocytosis trending down.  Continue empiric IV vancomycin and IV Rocephin.  Follow.    6.  Hypertension Blood pressure borderline.  Hold  antihypertensive medications.  Continue IV fluids.  7.    Well controlled diabetes mellitus type II Hemoglobin A1c = 6.4.    CBGs have ranged from 110 -173.  Continue  sliding scale insulin.    8.  Chronic venous stasis ulcers Wound care is assessed patient.  Continue wound care recommendations.  9.  Depression/anxiety Currently stable.  No suicidal or homicidal ideation.  Continue home regimen of Wellbutrin, Lexapro.  #10 morbid obesity  11.  Debility Per patient and wife patient is mostly wheelchair-bound.  PT/OT.  Patient may need skilled nursing facility.  Her graph #  12 iron deficiency anemia H&H stable.  We will give a dose of IV Feraheme.  13.  Severe osteoarthritis right lower extremity Pain management.      DVT prophylaxis: Heparin Code Status: Full Family Communication: Updated patient and wife at bedside. Disposition Plan: Skilled nursing facility versus home with home health pending PT evaluation hopefully in the next 24-48 hours.   Consultants:   Wound current nursing.  Procedures:   CT head 02/18/2018  Chest x-ray 02/18/2018    Antimicrobials:   IV vancomycin 02/18/2018  IV Rocephin 02/18/2018   Subjective: She is sitting up in bed.  Patient is feeling better.  Patient denies any chest pain or shortness of breath.  Patient feels cellulitis is improving.  Confusion improved and likely close to baseline per wife.  Objective: Vitals:   02/18/18 1730 02/18/18 1824 02/18/18 2028 02/19/18 0536  BP: (!) 92/52 128/61 (!) 121/50 (!) 113/55  Pulse: 86 88 83 71  Resp: 18 18 20 18   Temp:   99.2 F (37.3 C) 98.3 F (36.8 C)  TempSrc:   Oral Oral  SpO2:  98% 96% 95%  Weight:   119.2 kg (262 lb 12.6 oz)   Height:        Intake/Output Summary (Last 24 hours) at 02/19/2018 1317 Last data filed at 02/19/2018 0900 Gross per 24 hour  Intake 3475 ml  Output 500 ml  Net 2975 ml   Filed Weights   02/18/18 1116 02/18/18 2028  Weight: 113.4 kg (250 lb) 119.2 kg (262 lb 12.6 oz)    Examination:  General exam: Appears calm and comfortable  Respiratory system: Clear to auscultation bilaterally, no wheezes, no  crackles, no rhonchi.Marland Kitchen Respiratory effort normal. Cardiovascular system: S1 & S2 heard, RRR. No JVD, murmurs, rubs, gallops or clicks. No pedal edema. Gastrointestinal system: Abdomen is nondistended, soft and nontender. No organomegaly or masses felt. Normal bowel sounds heard. Genitourinary: Moist, urine smelling, erythematous and blanchable in perineum and left flank with some improvement. Central nervous system: Alert and oriented. No focal neurological deficits. Extremities: Symmetric 5 x 5 power. Skin: Right lower extremity with decreasing erythema, less edematous, less warmth, less tender to palpation.  Bilateral chronic venous stasis changes.  Psychiatry: Judgement and insight appear normal. Mood & affect appropriate.     Data Reviewed: I have personally reviewed following labs and imaging studies  CBC: Recent Labs  Lab 02/18/18 1226 02/19/18 0615  WBC 19.1* 14.0*  NEUTROABS 17.4*  --   HGB 9.8* 8.9*  HCT 30.1* 26.9*  MCV 87.8 88.8  PLT 263 734   Basic Metabolic Panel: Recent Labs  Lab 02/18/18 1226 02/18/18 2019 02/19/18 0615  NA 137  --  135  K 4.4  --  3.8  CL 104  --  105  CO2 23  --  21*  GLUCOSE 158*  --  119*  BUN 48*  --  48*  CREATININE 2.40*  --  2.24*  CALCIUM 8.2*  --  8.1*  MG  --  1.5*  --   PHOS  --  3.1  --    GFR: Estimated Creatinine Clearance: 39.7 mL/min (A) (by C-G formula based on SCr of 2.24 mg/dL (H)). Liver Function Tests: Recent Labs  Lab 02/18/18 1226 02/19/18 0615  AST 37 27  ALT 21 17  ALKPHOS 95 82  BILITOT 1.0 0.5  PROT 7.0 6.1*  ALBUMIN 2.7* 2.3*   No results for input(s): LIPASE, AMYLASE in the last 168 hours. No results for input(s): AMMONIA in the last 168 hours. Coagulation Profile: Recent Labs  Lab 02/19/18 0615  INR 1.42   Cardiac Enzymes: No results for input(s): CKTOTAL, CKMB, CKMBINDEX, TROPONINI in the last 168 hours. BNP (last 3 results) No results for input(s): PROBNP in the last 8760  hours. HbA1C: Recent Labs    02/19/18 0615  HGBA1C 6.4*   CBG: Recent Labs  Lab 02/18/18 2102 02/19/18 0737 02/19/18 1145  GLUCAP 155* 110* 158*   Lipid Profile: No results for input(s): CHOL, HDL, LDLCALC, TRIG, CHOLHDL, LDLDIRECT in the last 72 hours. Thyroid Function Tests: Recent Labs    02/18/18 2019  TSH 5.247*   Anemia Panel: Recent Labs    02/19/18 0615 02/19/18 0843  VITAMINB12  --  410  FOLATE  --  9.1  FERRITIN  --  177  TIBC  --  161*  IRON  --  14*  RETICCTPCT 0.4  --    Sepsis Labs: No results for input(s): PROCALCITON, LATICACIDVEN in the last 168 hours.  Recent Results (from the past 240 hour(s))  Culture, blood (Routine X 2) w Reflex to ID Panel     Status: None (Preliminary result)   Collection Time: 02/18/18  8:18 PM  Result Value Ref Range Status   Specimen Description   Final    BLOOD RIGHT ARM Performed at [facility] [address], [place], [place] [address]    Special Requests   Final    BOTTLES DRAWN AEROBIC AND ANAEROBIC Blood Culture adequate volume   Culture   Final    NO GROWTH < 12 HOURS Performed at [facility], [place] [address] [place], [place] [address]    Report Status PENDING  Incomplete  Culture, blood (Routine X 2) w Reflex to ID Panel     Status: None (Preliminary result)   Collection Time: 02/18/18  8:24 PM  Result Value Ref Range Status   Specimen Description   Final    BLOOD RIGHT ARM Performed at [facility] [address], [place], [place] [address]    Special Requests   Final    BAA BCAV Performed at [facility] [address] [place], [place] [address]    Culture   Final    NO GROWTH < 12 HOURS Performed at [facility] [address] [place], [place] [address]    Report Status PENDING  Incomplete         Radiology Studies: Ct Head Wo Contrast  Result Date: 02/18/2018 CLINICAL DATA:  Altered mental status for the past  few weeks with increased weakness. EXAM: CT HEAD WITHOUT CONTRAST TECHNIQUE: Contiguous axial images were obtained from the base of the skull through the vertex without intravenous contrast. COMPARISON:  09/05/2010 FINDINGS: BRAIN: There is sulcal and ventricular prominence consistent with superficial and central atrophy. No intraparenchymal hemorrhage, mass effect nor  midline shift. Periventricular and subcortical white matter hypodensities consistent with chronic small vessel ischemic disease are identified. No acute large vascular territory infarcts. No abnormal extra-axial fluid collections. Basal cisterns and fourth ventricle are not effaced and midline. VASCULAR: Moderate calcific atherosclerosis of the carotid siphons. SKULL: No skull fracture. No significant scalp soft tissue swelling. SINUSES/ORBITS: The mastoid air-cells are clear. The included paranasal sinuses demonstrate mild mucosal thickening of the included right maxillary and ethmoid sinuses. The included ocular globes and orbital contents are non-suspicious. OTHER: None. IMPRESSION: Atrophy with chronic appearing small vessel ischemia. No acute intracranial abnormality. Electronically Signed   By: Ashley Royalty M.D.   On: 02/18/2018 17:34   US Renal  Result Date: 02/18/2018 CLINICAL DATA:  Acute renal failure EXAM: RENAL / URINARY TRACT ULTRASOUND COMPLETE COMPARISON:  12/09/2017 FINDINGS: Right Kidney: Length: 12.9 cm. Acoustical window is difficult only minimal visualization of the right kidney is seen. No definitive hydronephrosis is noted. Left Kidney: Length: 11.9 cm. Cortical thinning is noted without hydronephrosis. Bladder: Decompressed IMPRESSION: Somewhat limited.  No hydronephrosis is seen. Electronically Signed   By: Inez Catalina M.D.   On: 02/18/2018 18:46   Dg Chest Port 1 View  Result Date: 02/18/2018 CLINICAL DATA:  Altered mental status. EXAM: PORTABLE CHEST 1 VIEW COMPARISON:  01/22/2018 FINDINGS: The cardiac silhouette  remains enlarged. Aortic atherosclerosis is noted. The lungs are mildly hypoinflated. There is persistent mild pulmonary vascular congestion and interstitial prominence diffusely, not significantly changed. A small right pleural effusion is unchanged. No pneumothorax is identified. IMPRESSION: Unchanged small right pleural effusion, pulmonary vascular congestion, and suspected mild interstitial edema. Electronically Signed   By: Logan Bores M.D.   On: 02/18/2018 12:44        Scheduled Meds: . buPROPion  300 mg Oral Daily  . collagenase   Topical Daily  . docusate sodium  100 mg Oral BID  . escitalopram  10 mg Oral Daily  . feeding supplement (PRO-STAT SUGAR FREE 64)  30 mL Oral BID  . gabapentin  400 mg Oral TID  . heparin  5,000 Units Subcutaneous Q8H  . insulin aspart  0-9 Units Subcutaneous TID WC  . oxybutynin  5 mg Oral BID  . senna  1 tablet Oral BID  . tamsulosin  0.4 mg Oral QPC supper   Continuous Infusions: . sodium chloride 125 mL/hr at 02/19/18 0528  . cefTRIAXone (ROCEPHIN)  IV    . sodium chloride    . vancomycin Stopped (02/18/18 2351)     LOS: 1 day    Time spent: 35 mins    Irine Seal, MD [facility] Pager [phone]  If 7PM-7AM, please contact night-coverage [email or web address] Password [email or web address] 02/19/2018, 1:17 PM

## 2018-02-19 NOTE — Progress Notes (Signed)
Inpatient Diabetes Program Recommendations  AACE/ADA: New Consensus Statement on Inpatient Glycemic Control (2015)  Target Ranges:  Prepandial:   less than 140 mg/dL      Peak postprandial:   less than 180 mg/dL (1-2 hours)      Critically ill patients:  140 - 180 mg/dL   Lab Results  Component Value Date   GLUCAP 173 (H) 02/19/2018   HGBA1C 6.4 (H) 02/19/2018    Review of Glycemic Control  Diabetes history:  Outpatient Diabetes medications: Novolog 0-11 units tidwc (not taking at present) Current orders for Inpatient glycemic control: Novolog 0-9 units tidwc CBGs 110-173 mg/dL HgbA1C 6.4% - although may not be accurate with low H/H. Pt eating 100%.  Inpatient Diabetes Program Recommendations:     Agree with current orders.  Will continue to follow.  Thank you. Lorenda Peck, RD, LDN, CDE Inpatient Diabetes Coordinator [phone]

## 2018-02-19 NOTE — Progress Notes (Addendum)
Spoke with pt and wife concerning discharge plans. Wife is in agreement with pt going to SNF. Pt is a little hesitant related to him appealing his discharge at last SNF. Will continue to follow. Pt was active with [facility] and concerned about them following him after discharge from a SNF.

## 2018-02-19 NOTE — Progress Notes (Signed)
PT Cancellation Note  Patient Details Name: MAXIMINO COZZOLINO MRN: [id number] DOB: 17-Oct-1941   Cancelled Treatment:    Reason Eval/Treat Not Completed: Patient at procedure , RN changing dressing. Will check back another time.  Claretha Cooper 02/19/2018, 2:22 PM Tresa Endo PT [phone]

## 2018-02-19 NOTE — Evaluation (Signed)
Physical Therapy Evaluation Patient Details Name: [name] MRN: [id number] DOB: Oct 31, 1941 Today's Date: 02/19/2018   History of Present Illness  [name] is a 77 y.o. male with medical history significant of anxiety/depression, diabetes, hyperlipidemia, chronic venous stasis ulcers bilateral lower extremity history of prostate cancer, urinary incontinence presented to the ED 02/18/18  history of confusion, , refusing to take his meds per the wife for the past 24 hours, complaining of increasing pain.  Patient's wife stated that patient refused to take his medications yesterday   Clinical Impression  The patient participated in bed mobility and sitting at bed edge. The patient's wife described how she alone assists with transfers to [facility] multiple times during the day in order to provide pericare and keep patient clean. Pt admitted with above diagnosis. Pt currently with functional limitations due to the deficits listed below (see PT Problem List). Pt will benefit from skilled PT to increase their independence and safety with mobility to allow discharge to the venue listed below.    Patient is nonambulatory.    Follow Up Recommendations SNF    Equipment Recommendations  (recommended parachute type slide sheet for transfers and repositioning in bed at home and bike gloves for patient to improve grip with transfers. [facility] and [facility] but wife is concerned about pt. ability to operate.)    Recommendations for Other Services       Precautions / Restrictions Precautions Precautions: Fall Precaution Comments: incontinence      Mobility  Bed Mobility Overal bed mobility: Needs Assistance Bed Mobility: Supine to Sit;Sit to Supine     Supine to sit: Mod assist Sit to supine: +2 for physical assistance;+2 for safety/equipment;Mod assist   General bed mobility comments: use of bed pad, patient  able to assist with UE's on rail. Able to scoot  self along bed after slide sheet placed. assist with legs onto bed.  Transfers                 General transfer comment: NT this visit  Ambulation/Gait                Stairs            Wheelchair Mobility    Modified Rankin (Stroke Patients Only)       Balance                                             Pertinent Vitals/Pain Pain Assessment: Faces Faces Pain Scale: Hurts even more Pain Location: right knee Pain Descriptors / Indicators: Throbbing;Tender;Sore Pain Intervention(s): Monitored during session;Repositioned;Limited activity within patient's tolerance    Home Living Family/patient expects to be discharged to:: Private residence Living Arrangements: Spouse/significant other Available Help at Discharge: Family;Available 24 hours/day Type of Home: House Home Access: Ramped entrance     Home Layout: One level Home Equipment: Hospital bed;Wheelchair - manual Additional Comments: was  at [facility] in near recent past x 1 month.    Prior Function Level of Independence: Needs assistance   Gait / Transfers Assistance Needed: Pt has only been performing bed to w/c transfers for several years with only the wife assiting. She reports there are 3 WC's, she transfers pt. to clean him and change bed. uses tansportation for out of house.  Hand Dominance        Extremity/Trunk Assessment   Upper Extremity Assessment Upper Extremity Assessment: Defer to OT evaluation    Lower Extremity Assessment Lower Extremity Assessment: RLE deficits/detail;LLE deficits/detail RLE Deficits / Details: decreased knee extension, legs wrapped with coban, noted crepitus LLE Deficits / Details: similar to right leg    Cervical / Trunk Assessment Cervical / Trunk Assessment: Kyphotic;Other exceptions Cervical / Trunk Exceptions: very slumped posture  Communication      Cognition Arousal/Alertness: Awake/alert Behavior During  Therapy: WFL for tasks assessed/performed Overall Cognitive Status: Within Functional Limits for tasks assessed                                        General Comments      Exercises     Assessment/Plan    PT Assessment Patient needs continued PT services  PT Problem List Decreased strength;Decreased range of motion;Decreased knowledge of use of DME;Decreased activity tolerance;Decreased safety awareness;Decreased balance;Decreased knowledge of precautions;Decreased mobility       PT Treatment Interventions DME instruction;Functional mobility training;Therapeutic activities;Patient/family education    PT Goals (Current goals can be found in the Care Plan section)  Acute Rehab PT Goals Patient Stated Goal: be able to get in [facility] PT Goal Formulation: With patient/family Time For Goal Achievement: 03/05/18 Potential to Achieve Goals: Fair    Frequency Min 2X/week   Barriers to discharge Decreased caregiver support      Co-evaluation               AM-PAC PT "6 Clicks" Daily Activity  Outcome Measure Difficulty turning over in bed (including adjusting bedclothes, sheets and blankets)?: Unable Difficulty moving from lying on back to sitting on the side of the bed? : Unable Difficulty sitting down on and standing up from a chair with arms (e.g., wheelchair, bedside commode, etc,.)?: Unable Help needed moving to and from a bed to chair (including a wheelchair)?: Total Help needed walking in hospital room?: Total Help needed climbing 3-5 steps with a railing? : Total 6 Click Score: 6    End of Session   Activity Tolerance: Patient tolerated treatment well Patient left: in bed;with call bell/phone within reach;with bed alarm set;with family/visitor present Nurse Communication: Mobility status PT Visit Diagnosis: Unsteadiness on feet (R26.81)    Time: 1007-1219 PT Time Calculation (min) (ACUTE ONLY): 29 min   Charges:   PT Evaluation $PT Eval Low  Complexity: 1 Low PT Treatments $Therapeutic Activity: 8-22 mins   PT G CodesTresa Endo PT [phone]   Claretha Cooper 02/19/2018, 5:51 PM

## 2018-02-19 NOTE — Consult Note (Addendum)
   [facility] CM Inpatient Consult   02/19/2018  [name] 1941/06/05 [id number]   [facility] Care Management referral received from Primary Care MD office.   Went to bedside along with inpatient RNCM to speak with patient and wife about [facility] Care Management.   Despite writer explaining [place] Management in detail and making him aware that his Primary Care MD, Dr. Sabra Heck, suggested [facility] Care Management follow, he still declined.   Mrs. Pigman was in agreement and states she needs the additional help. However, Mr. Climer did not want to consent. He did not want to consent to his information being faxed out for possible SNF placement either.   Provided [facility] Care Management packet along with contact information for Mr. Salazar and wife to call if he changes his mind.  Left voicemail message for Horris Latino with [facility] to make aware of bedside encounter.   Made [facility] Care Management office aware that [place] Management services were declined at this time.   Spoke with Dr. Grandville Silos as well about conversation with patient.   Marthenia Rolling, MSN-Ed, RN,BSN [facility] Liaison [phone]

## 2018-02-19 NOTE — Evaluation (Signed)
Clinical/Bedside Swallow Evaluation Patient Details  Name: [name] MRN: [id number] Date of Birth: [birth year]  Today's Date: 02/19/2018 Time: SLP Start Time (ACUTE ONLY): 1125 SLP Stop Time (ACUTE ONLY): 1200 SLP Time Calculation (min) (ACUTE ONLY): 35 min  Past Medical History:  Past Medical History:  Diagnosis Date  . CAP (community acquired pneumonia)   . CHF (congestive heart failure) ([place])   . Diabetes ([place])   . Hyperlipidemia   . Hypertension   . Hypotension   . Pleural effusion on right   . Prostate cancer ([place])   . Ulcers of both lower extremities ([place])   . Venous insufficiency (chronic) (peripheral)    Past Surgical History: History reviewed. No pertinent surgical history. HPI:  77 year old male admitted 02/18/18 with confusion. PMH significant for   Assessment / Plan / Recommendation Clinical Impression  Pt presents with adequate oral motor strength and function. He is missing dentition, but reports no history of swallowing difficulty prior to admit, and indicates tolerance of regular diet and thin liquids with only intermittent reflux. No overt s/s aspiration observed or reported. RN reported no difficulty with po meds. Will continue current diet (regular/thin) and follow briefly for assessment of diet tolerance and education.  SLP Visit Diagnosis: Dysphagia, unspecified (R13.10)    Aspiration Risk  Mild aspiration risk    Diet Recommendation Regular;Thin liquid   Liquid Administration via: Cup;Straw Medication Administration: Whole meds with liquid Supervision: Patient able to self feed Compensations: Minimize environmental distractions;Slow rate;Small sips/bites Postural Changes: Seated upright at 90 degrees;Remain upright for at least 30 minutes after po intake    Other  Recommendations Oral Care Recommendations: Oral care BID   Follow up Recommendations None      Frequency and Duration min 1 x/week  1 week       Prognosis Prognosis for Safe Diet  Advancement: Good      Swallow Study   General Date of Onset: 02/18/18 HPI: 77 year old male admitted 02/18/18 with confusion. PMH significant for Type of Study: Bedside Swallow Evaluation Previous Swallow Assessment: none found Diet Prior to this Study: Regular;Thin liquids Temperature Spikes Noted: No Respiratory Status: Room air History of Recent Intubation: No Behavior/Cognition: Cooperative;Alert Oral Care Completed by SLP: No Oral Cavity - Dentition: Missing dentition;Poor condition Vision: Functional for self-feeding Self-Feeding Abilities: Able to feed self Patient Positioning: Upright in bed Baseline Vocal Quality: Normal Volitional Cough: Strong Volitional Swallow: Able to elicit    Oral/Motor/Sensory Function Overall Oral Motor/Sensory Function: Within functional limits   Ice Chips Ice chips: Not tested   Thin Liquid Thin Liquid: Within functional limits Presentation: Straw;Self Fed    Nectar Thick Nectar Thick Liquid: Not tested   Honey Thick Honey Thick Liquid: Not tested   Puree Puree: Within functional limits Presentation: [name] [facility], CCC-SLP Speech Language Pathologist [phone] Solid: Within functional limits Presentation: [name], [name] 02/19/2018,12:04 PM

## 2018-02-20 DIAGNOSIS — G9340 Encephalopathy, unspecified: Secondary | ICD-10-CM

## 2018-02-20 DIAGNOSIS — N179 Acute kidney failure, unspecified: Secondary | ICD-10-CM

## 2018-02-20 DIAGNOSIS — L03115 Cellulitis of right lower limb: Principal | ICD-10-CM

## 2018-02-20 DIAGNOSIS — I10 Essential (primary) hypertension: Secondary | ICD-10-CM

## 2018-02-20 DIAGNOSIS — N189 Chronic kidney disease, unspecified: Secondary | ICD-10-CM

## 2018-02-20 LAB — CBC WITH DIFFERENTIAL/PLATELET
BASOS ABS: 0 10*3/uL (ref 0.0–0.1)
BASOS PCT: 0 %
EOS ABS: 0.3 10*3/uL (ref 0.0–0.7)
EOS PCT: 4 %
HCT: 29 % — ABNORMAL LOW (ref 39.0–52.0)
HEMOGLOBIN: 9.3 g/dL — AB (ref 13.0–17.0)
Lymphocytes Relative: 11 %
Lymphs Abs: 0.9 10*3/uL (ref 0.7–4.0)
MCH: 28.7 pg (ref 26.0–34.0)
MCHC: 32.1 g/dL (ref 30.0–36.0)
MCV: 89.5 fL (ref 78.0–100.0)
Monocytes Absolute: 0.6 10*3/uL (ref 0.1–1.0)
Monocytes Relative: 7 %
NEUTROS PCT: 78 %
Neutro Abs: 6.5 10*3/uL (ref 1.7–7.7)
PLATELETS: 245 10*3/uL (ref 150–400)
RBC: 3.24 MIL/uL — AB (ref 4.22–5.81)
RDW: 15.2 % (ref 11.5–15.5)
WBC: 8.3 10*3/uL (ref 4.0–10.5)

## 2018-02-20 LAB — MAGNESIUM: MAGNESIUM: 2.2 mg/dL (ref 1.7–2.4)

## 2018-02-20 LAB — GLUCOSE, CAPILLARY
GLUCOSE-CAPILLARY: 173 mg/dL — AB (ref 65–99)
Glucose-Capillary: 145 mg/dL — ABNORMAL HIGH (ref 65–99)
Glucose-Capillary: 189 mg/dL — ABNORMAL HIGH (ref 65–99)
Glucose-Capillary: 135 mg/dL — ABNORMAL HIGH (ref 65–99)

## 2018-02-20 LAB — RENAL FUNCTION PANEL
ALBUMIN: 2.3 g/dL — AB (ref 3.5–5.0)
ANION GAP: 10 (ref 5–15)
BUN: 59 mg/dL — ABNORMAL HIGH (ref 6–20)
CALCIUM: 8.1 mg/dL — AB (ref 8.9–10.3)
CO2: 22 mmol/L (ref 22–32)
CREATININE: 2.45 mg/dL — AB (ref 0.61–1.24)
Chloride: 104 mmol/L (ref 101–111)
GFR calc Af Amer: 28 mL/min — ABNORMAL LOW (ref >60)
GFR calc non Af Amer: 24 mL/min — ABNORMAL LOW (ref >60)
Glucose, Bld: 114 mg/dL — ABNORMAL HIGH (ref 65–99)
PHOSPHORUS: 4.1 mg/dL (ref 2.5–4.6)
Potassium: 3.6 mmol/L (ref 3.5–5.1)
SODIUM: 136 mmol/L (ref 135–145)

## 2018-02-20 MED ORDER — PREMIER PROTEIN SHAKE
11.0000 [oz_av] | Freq: Two times a day (BID) | ORAL | Status: DC
  Administered 2018-02-20 – 2018-02-22 (×4): 11 [oz_av] via ORAL
  Filled 2018-02-20 (×5): qty 325.31

## 2018-02-20 NOTE — Clinical Social Work Note (Signed)
Clinical Social Work Assessment  Patient Details  Name: [name] MRN: [id number] Date of Birth: 12/09/1941  Date of referral:  02/20/18               Reason for consult:  Facility Placement                Permission sought to share information with:  Case Manager, Customer service manager, Family Supports Permission granted to share information::  Yes, Verbal Permission Granted  Name::     Agricultural consultant::  SNF  Relationship::  Spouse  Contact Information:     Housing/Transportation Living arrangements for the past 2 months:  [place], [place] of Information:  Patient, Spouse Patient Interpreter Needed:  None Criminal Activity/Legal Involvement Pertinent to Current Situation/Hospitalization:  No - Comment as needed Significant Relationships:  Warehouse manager, Spouse Lives with:  Spouse Do you feel safe going back to the place where you live?  Yes Need for family participation in patient care:  Yes (Comment)  Care giving concerns:  No care giving concerns at the time of assessment.    Social Worker assessment / plan:  LCSW following for SNF placement.  Patient admitted for confusion.  LCSW met with patient at bedside. Patient's wife [name] is present. LCSW explained role and reason for visit.   Patient is apprehensive to SNF. Patient currently has home health through [facility] and he is very satisfied with services. Patient reports he is affriad that he will get new staff for [facility] services if he disrupts services to go to SNF.   Patient was dc from [facility] in February. Patient was there for 23 days.   At home patient required assistance with ADLs. Patient uses a a wheel chair at baseline and is able to transfer himself.   Patient gave LCSW permission to fax him out to SNF facilities.   PLAN: TBD patient is unsure if he wants to go to SNF.   Employment status:  Retired Forensic scientist:  Medicare PT  Recommendations:  [place] / Referral to community resources:     Patient/Family's Response to care:  Patient and spouse are thankful for LCSW visit. Spouse asked questions about [facility] services. LCSW answered questions.   Patient/Family's Understanding of and Emotional Response to Diagnosis, Current Treatment, and Prognosis: Patient and family are understanding of diagnosis. Patient is apprehensive to SNF, currently has [facility] and is satisfied with services.    Emotional Assessment Appearance:  Appears stated age Attitude/Demeanor/Rapport:    Affect (typically observed):  Apprehensive Orientation:  Oriented to Self, Oriented to Place, Oriented to  Time, Oriented to Situation Alcohol / Substance use:  Not Applicable Psych involvement (Current and /or in the community):  No (Comment)  Discharge Needs  Concerns to be addressed:  No discharge needs identified Readmission within the last 30 days:  No Current discharge risk:  None Barriers to Discharge:  Continued Medical Work up, No SNF bed   [name] Snare, LCSW 02/20/2018, 11:25 AM

## 2018-02-20 NOTE — Progress Notes (Signed)
Initial Nutrition Assessment  DOCUMENTATION CODES:   Obesity unspecified  INTERVENTION:   -Continue Juven Fruit Punch BID, each serving provides 80kcal and 14g of protein (amino acids glutamine and arginine) -Continue Prostat liquid protein PO 30 ml BID with meals, each supplement provides 100 kcal, 15 grams protein. -Provide Premier Protein BID, each supplement provides 160 kcal and 30 grams of protein.    NUTRITION DIAGNOSIS:   Increased nutrient needs related to wound healing as evidenced by estimated needs.  GOAL:   Patient will meet greater than or equal to 90% of their needs  MONITOR:   PO intake, Supplement acceptance, Weight trends, Labs, Skin, I & O's  REASON FOR ASSESSMENT:   Consult Assessment of nutrition requirement/status  ASSESSMENT:   78 year old gentleman history of anxiety/depression, diabetes, hyperlipidemia, chronic venous stasis ulcers bilateral lower extremity, history of prostate cancer, urinary incontinence who wears a diaper presented to the ED with a 1-2-day history of confusion, refusal to take his meds, increasing diffusing pain.     Patient in room with wife at bedside. Pt reports good appetite and he likes the food he is receiving. Pt and pt's wife both agree that pt has been eating well at home, typically eats 3 meals a day. Pt's wife states she doesn't cook a lot of meat but they do eat fish, beans and dairy foods. Pt likes Premier Protein supplements at home, RD to order for patient.  SLP evaluated patient on 3/12: no needs were identified.  Pt consuming 100% of meals at this time. Will continue Prostat and Juven supplement to aid in wound healing.  Per chart review, pt's weight is stable. Pt's wife made a comment about she is trying to help the patient "lose his belly" by following the diabetes diet.   Medications: Colace capsule BID, Senokot tablet BID Labs reviewed: CBGs: 145-167 Mg/Phos WNL  NUTRITION - FOCUSED PHYSICAL EXAM:   Most Recent Value  Orbital Region  No depletion  Upper Arm Region  No depletion  Thoracic and Lumbar Region  Unable to assess  Buccal Region  No depletion  Temple Region  No depletion  Clavicle Bone Region  Mild depletion  Clavicle and Acromion Bone Region  Mild depletion  Scapular Bone Region  Unable to assess  Dorsal Hand  No depletion  Patellar Region  Unable to assess  Anterior Thigh Region  Unable to assess  Posterior Calf Region  Unable to assess  Edema (RD Assessment)  Mild       Diet Order:  Diet Carb Modified Fluid consistency: Thin; Room service appropriate? Yes  EDUCATION NEEDS:   Education needs have been addressed  Skin:  Skin Assessment: Skin Integrity Issues: Skin Integrity Issues:: Stage I, Stage III Stage I: buttocks Stage III: left heel  Last BM:  3/11  Height:   Ht Readings from Last 1 Encounters:  02/18/18 6\' 2"  (1.88 m)    Weight:   Wt Readings from Last 1 Encounters:  02/18/18 262 lb 12.6 oz (119.2 kg)    Ideal Body Weight:  86.3 kg  BMI:  Body mass index is 33.74 kg/m.  Estimated Nutritional Needs:   Kcal:  2200-2400  Protein:  100-110g  Fluid:  2.2L/day   Clayton Bibles, MS, RD, LDN [place] Dietitian Pager: [phone] After Hours Pager: [phone]

## 2018-02-20 NOTE — NC FL2 (Signed)
[place] LEVEL OF CARE SCREENING TOOL     IDENTIFICATION  Patient Name: [name] Birthdate: Dec 26, 1940 Sex: male Admission Date (Current Location): 02/18/2018  [facility] and [place] Number:  Herbalist and Address:  [facility],  [place] [address], [place]      Provider Number: [id number]  Attending Physician Name and Address:  Jonetta Osgood, MD  Relative Name and Phone Number:       Current Level of Care: Hospital Recommended Level of Care: [place] Prior Approval Number: [id number]  Date Approved/Denied: 02/20/18 PASRR Number:    Discharge Plan: SNF    Current Diagnoses: Patient Active Problem List   Diagnosis Date Noted  . Anemia 02/19/2018  . Dermatitis associated with moisture from urinary incontinence 02/19/2018  . Acute encephalopathy 02/18/2018  . Acute kidney injury superimposed on CKD ([place]) 02/18/2018  . Dehydration 02/18/2018  . Leukocytosis 02/18/2018  . Cellulitis of leg, right 02/18/2018  . Cellulitis of flank 02/18/2018  . Cellulitis of perineum 02/18/2018  . Cellulitis of buttock   . ARF (acute renal failure) ([place])   . CAP (community acquired pneumonia) 12/06/2017  . Dyspnea 12/06/2017  . Acute heart failure ([place]) 12/06/2017  . Pleural effusion on right 12/06/2017  . Diabetes ([place])   . WOUND, LEG 01/01/2011  . [place] SHOULDER REGION 11/23/2010  . Decubitus ulcer of heel 11/16/2010  . ADENOCARCINOMA, PROSTATE 09/13/2010  . Type 2 diabetes mellitus with hyperlipidemia ([place]) 09/13/2010  . Hyperlipidemia 09/13/2010  . OBESITY, MORBID 09/13/2010  . ANXIETY DEPRESSION 09/13/2010  . HYPERTENSION, BENIGN ESSENTIAL 09/13/2010  . VENOUS STASIS ULCER 09/13/2010  . Venous (peripheral) insufficiency 09/13/2010  . DEGENERATIVE JOINT DISEASE, HIPS 09/13/2010  . Backache 09/13/2010  . GAIT DISTURBANCE 09/13/2010  . INCONTINENCE, URGE 09/13/2010  . HIP  REPLACEMENT, BILATERAL, HX OF 09/13/2010    Orientation RESPIRATION BLADDER Height & Weight     Self, Time, Situation, Place  Normal Indwelling catheter Weight: 262 lb 12.6 oz (119.2 kg) Height:  6\' 2"  (188 cm)  BEHAVIORAL SYMPTOMS/MOOD NEUROLOGICAL BOWEL NUTRITION STATUS      Continent Diet(See dc summary)  AMBULATORY STATUS COMMUNICATION OF NEEDS Skin   Extensive Assist Verbally PU Stage and Appropriate Care, Other (Comment)(left heel) PU Stage 1 Dressing: (Pressure injury buttocks)                     Personal Care Assistance Level of Assistance  Bathing, Feeding, Dressing Bathing Assistance: Maximum assistance Feeding assistance: Independent Dressing Assistance: Maximum assistance     Functional Limitations Info  Hearing, Speech, Sight Sight Info: Impaired Hearing Info: Adequate Speech Info: Adequate    SPECIAL CARE FACTORS FREQUENCY  PT (By licensed PT), OT (By licensed OT)     PT Frequency: 5x/week OT Frequency: 5x/week            Contractures Contractures Info: Not present    Additional Factors Info  Code Status, Allergies Code Status Info: Full Allergies Info: Penicillins, Sulfa Antibiotics           Current Medications (02/20/2018):  This is the current hospital active medication list Current Facility-Administered Medications  Medication Dose Route Frequency Provider Last Rate Last Dose  . 0.9 %  sodium chloride infusion   Intravenous Continuous Jonetta Osgood, MD 10 mL/hr at 02/20/18 [phone]    . acetaminophen (TYLENOL) tablet 650 mg  650 mg Oral Q6H PRN Eugenie Filler, MD  Or  . acetaminophen (TYLENOL) suppository 650 mg  650 mg Rectal Q6H PRN Eugenie Filler, MD      . buPROPion (WELLBUTRIN XL) 24 hr tablet 300 mg  300 mg Oral Daily Eugenie Filler, MD   300 mg at 02/20/18 0950  . cefTRIAXone (ROCEPHIN) 2 g in sodium chloride 0.9 % 100 mL IVPB  2 g Intravenous Q24H Eugenie Filler, MD 200 mL/hr at 02/19/18 1814 2 g at  02/19/18 1814  . collagenase (SANTYL) ointment   Topical Daily Eugenie Filler, MD      . docusate sodium (COLACE) capsule 100 mg  100 mg Oral BID Eugenie Filler, MD   100 mg at 02/20/18 0949  . escitalopram (LEXAPRO) tablet 10 mg  10 mg Oral Daily Eugenie Filler, MD   10 mg at 02/20/18 0948  . feeding supplement (PRO-STAT SUGAR FREE 64) liquid 30 mL  30 mL Oral BID Eugenie Filler, MD   30 mL at 02/20/18 0949  . fluconazole (DIFLUCAN) tablet 100 mg  100 mg Oral Daily Eugenie Filler, MD   100 mg at 02/20/18 0949  . gabapentin (NEURONTIN) capsule 400 mg  400 mg Oral TID Eugenie Filler, MD   400 mg at 02/20/18 0949  . heparin injection 5,000 Units  5,000 Units Subcutaneous Q8H Eugenie Filler, MD   5,000 Units at 02/20/18 0540  . insulin aspart (novoLOG) injection 0-9 Units  0-9 Units Subcutaneous TID WC Eugenie Filler, MD   1 Units at 02/20/18 [phone]  . nutrition supplement (JUVEN) (JUVEN) powder packet 1 packet  1 packet Oral BID BM Eugenie Filler, MD   1 packet at 02/20/18 0957  . ondansetron (ZOFRAN) tablet 4 mg  4 mg Oral Q6H PRN Eugenie Filler, MD       Or  . ondansetron [facility]) injection 4 mg  4 mg Intravenous Q6H PRN Eugenie Filler, MD      . oxybutynin (DITROPAN) tablet 5 mg  5 mg Oral BID Eugenie Filler, MD   5 mg at 02/20/18 0949  . polyethylene glycol (MIRALAX / GLYCOLAX) packet 17 g  17 g Oral Daily PRN Eugenie Filler, MD      . senna [facility]) tablet 8.6 mg  1 tablet Oral BID Eugenie Filler, MD   8.6 mg at 02/20/18 0949  . sodium chloride 0.9 % bolus 500 mL  500 mL Intravenous Once Eugenie Filler, MD      . sodium phosphate (FLEET) 7-19 GM/118ML enema 1 enema  1 enema Rectal Once PRN Eugenie Filler, MD      . sorbitol 70 % solution 30 mL  30 mL Oral Daily PRN Eugenie Filler, MD      . tamsulosin [facility]) capsule 0.4 mg  0.4 mg Oral QPC supper Eugenie Filler, MD   0.4 mg at 02/19/18 1815  . traMADol (ULTRAM) tablet 50  mg  50 mg Oral Q6H PRN Eugenie Filler, MD   50 mg at 02/18/18 2153  . vancomycin (VANCOCIN) 1,750 mg in sodium chloride 0.9 % 500 mL IVPB  1,750 mg Intravenous Q48H Thomes Lolling, [facility]   Stopped at 02/18/18 2351     Discharge Medications: Please see discharge summary for a list of discharge medications.  Relevant Imaging Results:  Relevant Lab Results:   Additional Information SSN: [id number], [place]

## 2018-02-20 NOTE — Progress Notes (Signed)
PROGRESS NOTE        PATIENT DETAILS Name: [name] Age: 77 y.o. Sex: male Date of Birth: 1941/02/07 Admit Date: 02/18/2018 Admitting Physician Eugenie Filler, MD VQM:GQQPYP, Lattie Haw, MD  Brief Narrative: Patient is a 77 y.o. male with history of anxiety/depression, diabetes, dyslipidemia, chronic venous stasis ulcers lower extremities-presented with encephalopathy secondary to cellulitis of lower extremity.  Subjective: Completely awake and alert-no major issues overnight.  Assessment/Plan: Acute metabolic encephalopathy: Likely secondary to cellulitis-his mental status is significantly improved-he is alert and oriented x4 this morning.    Right lower extremity cellulitis superimposed on dermatitis/chronic venous stasis: Clinically improved-appreciate wound care evaluation-unna wraps in place.  Continue empiric antimicrobial therapy-we will transition to oral antimicrobial agents in the next day or so.  Acute kidney injury on chronic kidney disease stage III: Likely hemodynamically mediated-slowly improving.  Monitor electrolytes.  Hypertension: Pressure controlled without the use of any antihypertensives-follow and resume accordingly.  DM-2: CBG stable-continue SSI  Depression/anxiety: Stable-continue Lexapro and Wellbutrin.  Peripheral neuropathy: Stable-continue Neurontin.  Acute on chronic debility: Significant debility at baseline-mostly wheelchair-bound-but able to transfer-acute worsening likely due to acute medical issues as outlined above.  PT evaluation completed-SNF on discharge.  Osteoarthritis of right knee: Stable for outpatient follow-up  Morbid obesity  DVT Prophylaxis: Prophylactic Heparin   Code Status: Full code   Family Communication: None at bedside  Disposition Plan: Remain inpatient-SNF on discharge  Antimicrobial agents: Anti-infectives (From admission, onward)   Start     Dose/Rate Route Frequency Ordered  Stop   02/20/18 1000  fluconazole (DIFLUCAN) tablet 100 mg     100 mg Oral Daily 02/19/18 1800     02/19/18 1845  fluconazole (DIFLUCAN) IVPB 200 mg     200 mg 100 mL/hr over 60 Minutes Intravenous  Once 02/19/18 1800 02/19/18 1948   02/18/18 2000  vancomycin (VANCOCIN) 1,750 mg in sodium chloride 0.9 % 500 mL IVPB     1,750 mg 250 mL/hr over 120 Minutes Intravenous Every 48 hours 02/18/18 1739     02/18/18 1800  cefTRIAXone (ROCEPHIN) 2 g in sodium chloride 0.9 % 100 mL IVPB     2 g 200 mL/hr over 30 Minutes Intravenous Every 24 hours 02/18/18 1705     02/18/18 1300  vancomycin (VANCOCIN) IVPB 1000 mg/200 mL premix     1,000 mg 200 mL/hr over 60 Minutes Intravenous  Once 02/18/18 1258 02/18/18 1546      Procedures: None  CONSULTS:  None  Time spent: 25 minutes-Greater than 50% of this time was spent in counseling, explanation of diagnosis, planning of further management, and coordination of care.  MEDICATIONS: Scheduled Meds: . buPROPion  300 mg Oral Daily  . collagenase   Topical Daily  . docusate sodium  100 mg Oral BID  . escitalopram  10 mg Oral Daily  . feeding supplement (PRO-STAT SUGAR FREE 64)  30 mL Oral BID  . fluconazole  100 mg Oral Daily  . gabapentin  400 mg Oral TID  . heparin  5,000 Units Subcutaneous Q8H  . insulin aspart  0-9 Units Subcutaneous TID WC  . nutrition supplement (JUVEN)  1 packet Oral BID BM  . oxybutynin  5 mg Oral BID  . senna  1 tablet Oral BID  . tamsulosin  0.4 mg Oral QPC supper   Continuous Infusions: . sodium chloride  10 mL/hr at 02/20/18 0927  . cefTRIAXone (ROCEPHIN)  IV 2 g (02/19/18 1814)  . sodium chloride    . vancomycin Stopped (02/18/18 2351)   PRN Meds:.acetaminophen **OR** acetaminophen, ondansetron **OR** ondansetron (ZOFRAN) IV, polyethylene glycol, sodium phosphate, sorbitol, traMADol   PHYSICAL EXAM: Vital signs: Vitals:   02/18/18 2028 02/19/18 0536 02/19/18 2059 02/20/18 0352  BP: (!) 121/50 (!) 113/55  119/63 131/76  Pulse: 83 71 69 64  Resp: 20 18 18 18   Temp: 99.2 F (37.3 C) 98.3 F (36.8 C) 98 F (36.7 C) (!) 97.4 F (36.3 C)  TempSrc: Oral Oral Oral Oral  SpO2: 96% 95% 99% 97%  Weight: 119.2 kg (262 lb 12.6 oz)     Height:       Filed Weights   02/18/18 1116 02/18/18 2028  Weight: 113.4 kg (250 lb) 119.2 kg (262 lb 12.6 oz)   Body mass index is 33.74 kg/m.   General appearance :Awake, alert, not in any distress.  Eyes:, pupils equally reactive to light and accomodation,no scleral icterus.Pink conjunctiva HEENT: Atraumatic and Normocephalic Neck: supple, no JVD. No cervical lymphadenopathy. No thyromegaly Resp:Good air entry bilaterally, no added sounds  CVS: S1 S2 regular, no murmurs.  GI: Bowel sounds present, Non tender and not distended with no gaurding, rigidity or rebound. Extremities: B/L Lower Ext shows no edema, unna wraps in place Neurology:  speech clear,Non focal-but with generalized weakness  psychiatric: Normal judgment and insight. Alert and oriented x 3. Normal mood. Musculoskeletal:No digital cyanosis Skin:No Rash, warm and dry Wounds:N/A  I have personally reviewed following labs and imaging studies  LABORATORY DATA: CBC: Recent Labs  Lab 02/18/18 1226 02/19/18 0615 02/20/18 0620  WBC 19.1* 14.0* 8.3  NEUTROABS 17.4*  --  6.5  HGB 9.8* 8.9* 9.3*  HCT 30.1* 26.9* 29.0*  MCV 87.8 88.8 89.5  PLT 263 233 867    Basic Metabolic Panel: Recent Labs  Lab 02/18/18 1226 02/18/18 2019 02/19/18 0615 02/20/18 0620  NA 137  --  135 136  K 4.4  --  3.8 3.6  CL 104  --  105 104  CO2 23  --  21* 22  GLUCOSE 158*  --  119* 114*  BUN 48*  --  48* 59*  CREATININE 2.40*  --  2.24* 2.45*  CALCIUM 8.2*  --  8.1* 8.1*  MG  --  1.5*  --  2.2  PHOS  --  3.1  --  4.1    GFR: Estimated Creatinine Clearance: 36.3 mL/min (A) (by C-G formula based on SCr of 2.45 mg/dL (H)).  Liver Function Tests: Recent Labs  Lab 02/18/18 1226 02/19/18 0615  02/20/18 0620  AST 37 27  --   ALT 21 17  --   ALKPHOS 95 82  --   BILITOT 1.0 0.5  --   PROT 7.0 6.1*  --   ALBUMIN 2.7* 2.3* 2.3*   No results for input(s): LIPASE, AMYLASE in the last 168 hours. No results for input(s): AMMONIA in the last 168 hours.  Coagulation Profile: Recent Labs  Lab 02/19/18 0615  INR 1.42    Cardiac Enzymes: No results for input(s): CKTOTAL, CKMB, CKMBINDEX, TROPONINI in the last 168 hours.  BNP (last 3 results) No results for input(s): PROBNP in the last 8760 hours.  HbA1C: Recent Labs    02/19/18 0615  HGBA1C 6.4*    CBG: Recent Labs  Lab 02/19/18 0737 02/19/18 1145 02/19/18 1647 02/19/18 2241 02/20/18 0740  GLUCAP 110*  158* 173* 167* 145*    Lipid Profile: No results for input(s): CHOL, HDL, LDLCALC, TRIG, CHOLHDL, LDLDIRECT in the last 72 hours.  Thyroid Function Tests: Recent Labs    02/18/18 2019  TSH 5.247*    Anemia Panel: Recent Labs    02/19/18 0615 02/19/18 0843  VITAMINB12  --  410  FOLATE  --  9.1  FERRITIN  --  177  TIBC  --  161*  IRON  --  14*  RETICCTPCT 0.4  --     Urine analysis:    Component Value Date/Time   COLORURINE YELLOW 02/18/2018 1212   APPEARANCEUR CLOUDY (A) 02/18/2018 1212   LABSPEC 1.015 02/18/2018 1212   PHURINE 5.0 02/18/2018 1212   GLUCOSEU 50 (A) 02/18/2018 1212   HGBUR LARGE (A) 02/18/2018 1212   BILIRUBINUR NEGATIVE 02/18/2018 1212   KETONESUR NEGATIVE 02/18/2018 1212   PROTEINUR 100 (A) 02/18/2018 1212   UROBILINOGEN 0.2 06/09/2011 0522   NITRITE NEGATIVE 02/18/2018 1212   LEUKOCYTESUR NEGATIVE 02/18/2018 1212    Sepsis Labs: Lactic Acid, Venous    Component Value Date/Time   LATICACIDVEN 0.85 12/06/2017 0935    MICROBIOLOGY: Recent Results (from the past 240 hour(s))  Culture, blood (Routine X 2) w Reflex to ID Panel     Status: None (Preliminary result)   Collection Time: 02/18/18  8:18 PM  Result Value Ref Range Status   Specimen Description   Final     BLOOD RIGHT ARM Performed at [facility], [place] [address] [place], [place] [address]    Special Requests   Final    BOTTLES DRAWN AEROBIC AND ANAEROBIC Blood Culture adequate volume   Culture   Final    NO GROWTH < 12 HOURS Performed at [facility], [place] [address] [place], [place] [address]    Report Status PENDING  Incomplete  Culture, blood (Routine X 2) w Reflex to ID Panel     Status: None (Preliminary result)   Collection Time: 02/18/18  8:24 PM  Result Value Ref Range Status   Specimen Description   Final    BLOOD RIGHT ARM Performed at [facility] [address] [place], [place] [address]    Special Requests   Final    BAA BCAV Performed at [facility] [address], [place], [place] [address]    Culture   Final    NO GROWTH < 12 HOURS Performed at [facility] [address], [place], [place] [address]    Report Status PENDING  Incomplete    RADIOLOGY STUDIES/RESULTS: Ct Head Wo Contrast  Result Date: 02/18/2018 CLINICAL DATA:  Altered mental status for the past few weeks with increased weakness. EXAM: CT HEAD WITHOUT CONTRAST TECHNIQUE: Contiguous axial images were obtained from the base of the skull through the vertex without intravenous contrast. COMPARISON:  09/05/2010 FINDINGS: BRAIN: There is sulcal and ventricular prominence consistent with superficial and central atrophy. No intraparenchymal hemorrhage, mass effect nor midline shift. Periventricular and subcortical white matter hypodensities consistent with chronic small vessel ischemic disease are identified. No acute large vascular territory infarcts. No abnormal extra-axial fluid collections. Basal cisterns and fourth ventricle are not effaced and midline. VASCULAR: Moderate calcific atherosclerosis of the carotid siphons. SKULL: No skull fracture. No significant scalp soft tissue swelling. SINUSES/ORBITS: The mastoid air-cells are clear.  The included paranasal sinuses demonstrate mild mucosal thickening of the included right maxillary and ethmoid sinuses. The included ocular globes and orbital  contents are non-suspicious. OTHER: None. IMPRESSION: Atrophy with chronic appearing small vessel ischemia. No acute intracranial abnormality. Electronically Signed   By: Ashley Royalty M.D.   On: 02/18/2018 17:34   US Renal  Result Date: 02/18/2018 CLINICAL DATA:  Acute renal failure EXAM: RENAL / URINARY TRACT ULTRASOUND COMPLETE COMPARISON:  12/09/2017 FINDINGS: Right Kidney: Length: 12.9 cm. Acoustical window is difficult only minimal visualization of the right kidney is seen. No definitive hydronephrosis is noted. Left Kidney: Length: 11.9 cm. Cortical thinning is noted without hydronephrosis. Bladder: Decompressed IMPRESSION: Somewhat limited.  No hydronephrosis is seen. Electronically Signed   By: Inez Catalina M.D.   On: 02/18/2018 18:46   Dg Chest Port 1 View  Result Date: 02/18/2018 CLINICAL DATA:  Altered mental status. EXAM: PORTABLE CHEST 1 VIEW COMPARISON:  01/22/2018 FINDINGS: The cardiac silhouette remains enlarged. Aortic atherosclerosis is noted. The lungs are mildly hypoinflated. There is persistent mild pulmonary vascular congestion and interstitial prominence diffusely, not significantly changed. A small right pleural effusion is unchanged. No pneumothorax is identified. IMPRESSION: Unchanged small right pleural effusion, pulmonary vascular congestion, and suspected mild interstitial edema. Electronically Signed   By: Logan Bores M.D.   On: 02/18/2018 12:44     LOS: 2 days   Oren Binet, MD  [facility] Pager:[phone]  If 7PM-7AM, please contact night-coverage [email or web address] Password [email or web address] 02/20/2018, 1:40 PM

## 2018-02-21 DIAGNOSIS — L258 Unspecified contact dermatitis due to other agents: Secondary | ICD-10-CM

## 2018-02-21 DIAGNOSIS — R32 Unspecified urinary incontinence: Secondary | ICD-10-CM

## 2018-02-21 DIAGNOSIS — N178 Other acute kidney failure: Secondary | ICD-10-CM

## 2018-02-21 LAB — BASIC METABOLIC PANEL
Anion gap: 10 (ref 5–15)
BUN: 61 mg/dL — AB (ref 6–20)
CHLORIDE: 106 mmol/L (ref 101–111)
CO2: 21 mmol/L — AB (ref 22–32)
CREATININE: 2.54 mg/dL — AB (ref 0.61–1.24)
Calcium: 8 mg/dL — ABNORMAL LOW (ref 8.9–10.3)
GFR calc Af Amer: 27 mL/min — ABNORMAL LOW (ref >60)
GFR calc non Af Amer: 23 mL/min — ABNORMAL LOW (ref >60)
GLUCOSE: 111 mg/dL — AB (ref 65–99)
POTASSIUM: 4.1 mmol/L (ref 3.5–5.1)
SODIUM: 137 mmol/L (ref 135–145)

## 2018-02-21 LAB — GLUCOSE, CAPILLARY
GLUCOSE-CAPILLARY: 105 mg/dL — AB (ref 65–99)
Glucose-Capillary: 190 mg/dL — ABNORMAL HIGH (ref 65–99)
Glucose-Capillary: 165 mg/dL — ABNORMAL HIGH (ref 65–99)

## 2018-02-21 MED ORDER — JUVEN PO PACK: 1.0000 | PACK | Freq: Two times a day (BID) | ORAL | 0 refills | Status: DC

## 2018-02-21 MED ORDER — POLYETHYLENE GLYCOL 3350 17 G PO PACK
17.0000 g | PACK | Freq: Every day | ORAL | 0 refills | Status: DC | PRN
Start: 2018-02-21 — End: 2018-08-09

## 2018-02-21 MED ORDER — SENNA 8.6 MG PO TABS: 1.0000 | ORAL_TABLET | Freq: Two times a day (BID) | ORAL | 0 refills | Status: DC

## 2018-02-21 MED ORDER — FUROSEMIDE 20 MG PO TABS: 40.0000 mg | ORAL_TABLET | Freq: Every day | ORAL | 0 refills | Status: DC

## 2018-02-21 MED ORDER — PREMIER PROTEIN SHAKE: 11.0000 [oz_av] | Freq: Two times a day (BID) | ORAL | 0 refills | Status: DC

## 2018-02-21 MED ORDER — PRO-STAT SUGAR FREE PO LIQD: 30.0000 mL | Freq: Two times a day (BID) | ORAL | 0 refills | Status: DC

## 2018-02-21 MED ORDER — FLUCONAZOLE 100 MG PO TABS: 100.0000 mg | ORAL_TABLET | Freq: Every day | ORAL | 0 refills | Status: DC

## 2018-02-21 MED ORDER — DOXYCYCLINE HYCLATE 100 MG PO CAPS: 100.0000 mg | ORAL_CAPSULE | Freq: Two times a day (BID) | ORAL | 0 refills | Status: DC

## 2018-02-21 NOTE — Progress Notes (Signed)
Occupational Therapy Treatment Patient Details Name: [name] MRN: [id number] DOB: [birth year] Today's Date: 02/21/2018    History of present illness [name] is a 77 y.o. male with medical history significant of anxiety/depression, diabetes, hyperlipidemia, chronic venous stasis ulcers bilateral lower extremity history of prostate cancer, urinary incontinence presented to the ED 02/18/18  history of confusion, , refusing to take his meds per the wife for the past 24 hours, complaining of increasing pain.  Patient's wife stated that patient refused to take his medications yesterday    OT comments  Pt is very motivated and tries hard.  Dyspnea 2/4 with wheezing. Rest breaks given.    Follow Up Recommendations  SNF    Equipment Recommendations  None recommended by OT    Recommendations for Other Services      Precautions / Restrictions Precautions Precautions: Fall Precaution Comments: incontinence       Mobility Bed Mobility         Supine to sit: Max assist;+2 for physical assistance Sit to supine: Max assist;+2 for physical assistance   General bed mobility comments: assist for bil LEs and trunk.  Utilized bed pad  Transfers       Sit to Stand: Mod assist;+2 physical assistance;From elevated surface         General transfer comment: from elevated bed. Stabilized arms on w/c armrests    Balance                                           ADL either performed or assessed with clinical judgement   ADL                               Toileting- Clothing Manipulation and Hygiene: Moderate assistance;Sit to/from stand(+3; one for hygiene)         General ADL Comments: attempted transfer to w/c.  Pt needed to be cleaned up. Stood 4xs.  R knee buckles and gives at times.  +2 standing for safety.       Vision       Perception     Praxis      Cognition Arousal/Alertness: Awake/alert Behavior During Therapy: WFL for  tasks assessed/performed Overall Cognitive Status: Within Functional Limits for tasks assessed                                          Exercises     Shoulder Instructions       General Comments      Pertinent Vitals/ Pain       Pain Assessment: Faces Faces Pain Scale: Hurts little more Pain Location: right knee Pain Descriptors / Indicators: Aching;Sore Pain Intervention(s): Limited activity within patient's tolerance;Monitored during session;Repositioned  Home Living                                          Prior Functioning/Environment              Frequency           Progress Toward Goals  OT Goals(current goals can now be found in the care plan section)  Progress towards OT goals: Progressing toward goals  ADL Goals Pt Will Perform Grooming: with set-up;sitting Pt Will Perform Lower Body Dressing: with min assist Pt Will Transfer to Toilet: with min assist;bedside commode;stand pivot transfer Pt Will Perform Toileting - Clothing Manipulation and hygiene: with min assist;sit to/from stand  Plan      Co-evaluation    PT/OT/SLP Co-Evaluation/Treatment: Yes Reason for Co-Treatment: For patient/therapist safety PT goals addressed during session: Mobility/safety with mobility OT goals addressed during session: ADL's and self-care      AM-PAC PT "6 Clicks" Daily Activity     Outcome Measure   Help from another person eating meals?: A Little Help from another person taking care of personal grooming?: A Little Help from another person toileting, which includes using toliet, bedpan, or urinal?: Total Help from another person bathing (including washing, rinsing, drying)?: A Lot Help from another person to put on and taking off regular upper body clothing?: A Lot Help from another person to put on and taking off regular lower body clothing?: Total 6 Click Score: 12    End of Session    OT Visit Diagnosis: Unsteadiness  on feet (R26.81);Muscle weakness (generalized) (M62.81)   Activity Tolerance Patient tolerated treatment well   Patient Left in bed;with call bell/phone within reach;with bed alarm set(requested 4 rails up)   Nurse Communication          Time: 2025-4270 OT Time Calculation (min): 41 min  Charges: OT General Charges $OT Visit: 1 Visit OT Treatments $Self Care/Home Management : 8-22 mins  Lesle Chris, OTR/L [phone] 02/21/2018   [place] 02/21/2018, 10:43 AM

## 2018-02-21 NOTE — Progress Notes (Signed)
Physical Therapy Treatment Patient Details Name: [name] MRN: [id number] DOB: 1941/01/13 Today's Date: 02/21/2018    History of Present Illness [name] is a 77 y.o. male with medical history significant of anxiety/depression, diabetes, hyperlipidemia, chronic venous stasis ulcers bilateral lower extremity history of prostate cancer, urinary incontinence presented to the ED 02/18/18  history of confusion, , refusing to take his meds per the wife for the past 24 hours, complaining of increasing pain.  Patient's wife stated that patient refused to take his medications yesterday     PT Comments     The patient participated in mobility and practice standing  X 4. Recommend SNf. Continue PT.  Follow Up Recommendations  SNF     Equipment Recommendations    possibly a STEDY for home, trapeze   Recommendations for Other Services       Precautions / Restrictions Precautions Precautions: Fall Precaution Comments: incontinence    Mobility  Bed Mobility         Supine to sit: Max assist;+2 for physical assistance Sit to supine: Max assist;+2 for physical assistance   General bed mobility comments: assist for bil LEs and trunk.  Utilized bed pad  Transfers       Sit to Stand: Mod assist;+2 physical assistance;From elevated surface         General transfer comment: from elevated bed. Stabilized arms on w/c armrests, performed x 4. Did not attempt transfer to [facility] this visit.  Ambulation/Gait                 Stairs            Wheelchair Mobility    Modified Rankin (Stroke Patients Only)       Balance                                            Cognition Arousal/Alertness: Awake/alert Behavior During Therapy: WFL for tasks assessed/performed Overall Cognitive Status: Within Functional Limits for tasks assessed                                        Exercises      General Comments        Pertinent  Vitals/Pain Pain Assessment: Faces Faces Pain Scale: Hurts little more Pain Location: right knee Pain Descriptors / Indicators: Aching;Sore Pain Intervention(s): Monitored during session    Home Living                      Prior Function            PT Goals (current goals can now be found in the care plan section) Progress towards PT goals: Progressing toward goals    Frequency    Min 2X/week      PT Plan      Co-evaluation PT/OT/SLP Co-Evaluation/Treatment: Yes Reason for Co-Treatment: For patient/therapist safety PT goals addressed during session: Mobility/safety with mobility OT goals addressed during session: ADL's and self-care      AM-PAC PT "6 Clicks" Daily Activity  Outcome Measure  Difficulty turning over in bed (including adjusting bedclothes, sheets and blankets)?: Unable Difficulty moving from lying on back to sitting on the side of the bed? : Unable Difficulty sitting down on and standing up from a chair  with arms (e.g., wheelchair, bedside commode, etc,.)?: Unable Help needed moving to and from a bed to chair (including a wheelchair)?: Total Help needed walking in hospital room?: Total Help needed climbing 3-5 steps with a railing? : Total 6 Click Score: 6    End of Session Equipment Utilized During Treatment: Gait belt Activity Tolerance: Patient tolerated treatment well Patient left: in bed;with call bell/phone within reach;with family/visitor present;with bed alarm set Nurse Communication: Mobility status PT Visit Diagnosis: Unsteadiness on feet (R26.81)     Time: 7517-0017 PT Time Calculation (min) (ACUTE ONLY): 41 min  Charges:  $Therapeutic Activity: 23-37 mins                    G CodesTresa Endo PT [phone]    Claretha Cooper 02/21/2018, 11:39 AM

## 2018-02-21 NOTE — Discharge Summary (Signed)
PATIENT DETAILS Name: [name] Age: 77 y.o. Sex: male Date of Birth: 04/20/1941 MRN: [id number]. Admitting Physician: Eugenie Filler, MD BOF:[id number], Lattie Haw, MD  Admit Date: 02/18/2018 Discharge date: 02/21/2018  Recommendations for Outpatient Follow-up:  1. Follow up with PCP in 1-2 weeks 2. Please obtain BMP/CBC in one week 3. Please ensure follow-up with nephrology in the outpatient setting. 4. Will need to be set up with wound care clinic once he is discharged from SNF. 5. Follow blood cultures until final  Admitted From:  Home  Disposition: SNF   Home Health: No  Equipment/Devices: None  Discharge Condition: Stable  CODE STATUS: FULL CODE  Diet recommendation:  Heart Healthy / Carb Modified   Brief Summary: See H&P, Labs, Consult and Test reports for all details in brief, Patient is a 77 y.o. male with history of prostate cancer, anxiety/depression, diabetes, dyslipidemia, chronic venous stasis ulcers lower extremities-presented with encephalopathy secondary to cellulitis of lower extremity.  Brief Hospital Course: Acute metabolic encephalopathy: Likely secondary to cellulitis-his mental status is significantly improved-he is alert and oriented x4 this morning.    Right lower extremity cellulitis superimposed on dermatitis/chronic venous stasis: Clinically improved-appreciate wound care evaluation-unna wraps in place. Managed with empiric vancomycin and Rocephin-we will transition to doxycycline on discharge. Patient will need wound care (see below) and would also need to be established at a local wound care clinic upon discharge from SNF.   Acute kidney injury on chronic kidney disease stage III: Likely hemodynamically mediated-creatinine at baseline appears to be around 1.5-2.0-his kidney function has leveled off to a creatinine of around 2.5.UA with mild proteinuria(but has a history of diabetes) renal ultrasound negative for hydronephrosis. Patient has  not yet established care with a nephrologist-4 and I would avoid nephrotoxic agents-hold off on restarting losartan-repeat labs in the next week or so-and consider outpatient evaluation by nephrology.  Hypertension: relatively well controlled without the use of any antihypertensives-is on Lasix-4 lower extremity edema.  DM-2: CBG stable-continue SSI  Depression/anxiety: Stable-continue Lexapro and Wellbutrin.  Peripheral neuropathy: Stable-continue Neurontin.  Acute on chronic debility: Significant debility at baseline-mostly wheelchair-bound-but able to transfer-acute worsening likely due to acute medical issues as outlined above.  PT evaluation completed-SNF on discharge.  Osteoarthritis of right knee: Stable for outpatient follow-up with orthopedics  Morbid obesity  Procedures/Studies: None  Discharge Diagnoses:  Principal Problem:   Acute encephalopathy Active Problems:   Type 2 diabetes mellitus with hyperlipidemia (HCC)   Hyperlipidemia   OBESITY, MORBID   ANXIETY DEPRESSION   HYPERTENSION, BENIGN ESSENTIAL   VENOUS STASIS ULCER   Venous (peripheral) insufficiency   Decubitus ulcer of heel   Backache   INCONTINENCE, URGE   Pleural effusion on right   Acute kidney injury superimposed on CKD (HCC)   Dehydration   Leukocytosis   Cellulitis of leg, right   Cellulitis of flank   Cellulitis of perineum   Anemia   Dermatitis associated with moisture from urinary incontinence   Discharge Instructions:  Activity:  As tolerated with Full fall precautions use walker/cane & assistance as needed   Discharge Instructions    Diet - low sodium heart healthy   Complete by:  As directed    Diet Carb Modified   Complete by:  As directed    Discharge instructions   Complete by:  As directed    Follow with Primary MD  Kathyrn Lass, MD  In 1 week  Follow with Nephrology at [facility] in 2 weeks  Please  get a complete blood count and chemistry panel checked by  your Primary MD at your next visit, and again as instructed by your Primary MD.  Get Medicines reviewed and adjusted: Please take all your medications with you for your next visit with your Primary MD  Laboratory/radiological data: Please request your Primary MD to go over all hospital tests and procedure/radiological results at the follow up, please ask your Primary MD to get all Hospital records sent to his/her office.  In some cases, they will be blood work, cultures and biopsy results pending at the time of your discharge. Please request that your primary care M.D. follows up on these results.  Also Note the following: If you experience worsening of your admission symptoms, develop shortness of breath, life threatening emergency, suicidal or homicidal thoughts you must seek medical attention immediately by calling 911 or calling your MD immediately  if symptoms less severe.  You must read complete instructions/literature along with all the possible adverse reactions/side effects for all the Medicines you take and that have been prescribed to you. Take any new Medicines after you have completely understood and accpet all the possible adverse reactions/side effects.   Do not drive when taking Pain medications or sleeping medications (Benzodaizepines)  Do not take more than prescribed Pain, Sleep and Anxiety Medications. It is not advisable to combine anxiety,sleep and pain medications without talking with your primary care practitioner  Special Instructions: If you have smoked or chewed Tobacco  in the last 2 yrs please stop smoking, stop any regular Alcohol  and or any Recreational drug use.  Wear Seat belts while driving.  Please note: You were cared for by a hospitalist during your hospital stay. Once you are discharged, your primary care physician will handle any further medical issues. Please note that NO REFILLS for any discharge medications will be authorized once you are discharged,  as it is imperative that you return to your primary care physician (or establish a relationship with a primary care physician if you do not have one) for your post hospital discharge needs so that they can reassess your need for medications and monitor your lab values.   Discharge wound care:   Complete by:  As directed    Dressing procedure/placement/frequency: Added absorptive antimicrobial dressing to the left heel wound and dry compression wraps. To be changed Tues/Th   Increase activity slowly   Complete by:  As directed      Allergies as of 02/21/2018      Reactions   Penicillins    Has patient had a PCN reaction causing immediate rash, facial/tongue/throat swelling, SOB or lightheadedness with hypotension: Unknown Has patient had a PCN reaction causing severe rash involving mucus membranes or skin necrosis:  /Unknown Has patient had a PCN reaction that required hospitalization: Unknown Has patient had a PCN reaction occurring within the last 10 years: Unknown If all of the above answers are "NO", then may proceed with Cephalosporin use.   Sulfa Antibiotics       Medication List    STOP taking these medications   losartan 50 MG tablet Commonly known as:  COZAAR   spironolactone 25 MG tablet Commonly known as:  ALDACTONE     TAKE these medications   acetaminophen 500 MG tablet Commonly known as:  TYLENOL Take 1 tablet (500 mg total) by mouth every 6 (six) hours as needed for mild pain or moderate pain.   BUDEPRION XL 300 MG 24 hr tablet Generic drug:  buPROPion Take 300 mg by mouth daily.   doxycycline 100 MG capsule Commonly known as:  VIBRAMYCIN Take 1 capsule (100 mg total) by mouth 2 (two) times daily. For 5 more days from 3/14 and then stop. What changed:  additional instructions   feeding supplement (PRO-STAT SUGAR FREE 64) Liqd Take 30 mLs by mouth 2 (two) times daily.   fluconazole 100 MG tablet Commonly known as:  DIFLUCAN Take 1 tablet (100 mg total) by  mouth daily. For 5 more days from 3/14and then stop Start taking on:  02/22/2018   furosemide 20 MG tablet Commonly known as:  LASIX Take 2 tablets (40 mg total) by mouth daily. What changed:    how much to take  Another medication with the same name was removed. Continue taking this medication, and follow the directions you see here.   gabapentin 400 MG capsule Commonly known as:  NEURONTIN Take 1 capsule (400 mg total) by mouth 3 (three) times daily.   insulin aspart 100 UNIT/ML injection Commonly known as:  novoLOG Inject 0-15 Units into the skin 3 (three) times daily with meals. CBG < 70: implement hypoglycemia protocol CBG 70 - 120: 0 units CBG 121 - 150: 2 units CBG 151 - 200: 3 units CBG 201 - 250: 5 units CBG 251 - 300: 8 units CBG 301 - 350: 11 units CBG 351 - 400: 15 units CBG > 400: call MD.   LEXAPRO 10 MG tablet Generic drug:  escitalopram Take 10 mg by mouth daily.   loperamide 2 MG capsule Commonly known as:  IMODIUM Take 1 capsule (2 mg total) by mouth 2 (two) times daily as needed for diarrhea or loose stools.   mupirocin ointment 2 % Commonly known as:  BACTROBAN APPLY TO AFFECTED AREA EVERY DAY   nutrition supplement (JUVEN) Pack Take 1 packet by mouth 2 (two) times daily between meals.   oxybutynin 5 MG tablet Commonly known as:  DITROPAN Take 5 mg by mouth 2 (two) times daily.   polyethylene glycol packet Commonly known as:  MIRALAX / GLYCOLAX Take 17 g by mouth daily as needed for mild constipation.   protein supplement shake Liqd Commonly known as:  PREMIER PROTEIN Take 325 mLs (11 oz total) by mouth 2 (two) times daily between meals.   RAPAFLO 8 MG Caps capsule Generic drug:  silodosin Take 1 capsule by mouth daily.   SANTYL ointment Generic drug:  collagenase APPLY TO AFFECTED AREA EVERY DAY   senna 8.6 MG Tabs tablet Commonly known as:  SENOKOT Take 1 tablet (8.6 mg total) by mouth 2 (two) times daily.              Discharge Care Instructions  (From admission, onward)        Start     Ordered   02/21/18 0000  Discharge wound care:    Comments:  Dressing procedure/placement/frequency: Added absorptive antimicrobial dressing to the left heel wound and dry compression wraps. To be changed Tues/Th   02/21/18 1031     Follow-up Information    Kathyrn Lass, MD. Schedule an appointment as soon as possible for a visit in 1 week(s).   Specialty:  Family Medicine Contact information: [place] [address] [phone]        Elmarie Shiley, MD. Schedule an appointment as soon as possible for a visit in 2 week(s).   Specialty:  Nephrology Contact information: [place] [address] [phone]  Allergies  Allergen Reactions  . Penicillins     Has patient had a PCN reaction causing immediate rash, facial/tongue/throat swelling, SOB or lightheadedness with hypotension: Unknown Has patient had a PCN reaction causing severe rash involving mucus membranes or skin necrosis:  /Unknown Has patient had a PCN reaction that required hospitalization: Unknown Has patient had a PCN reaction occurring within the last 10 years: Unknown If all of the above answers are "NO", then may proceed with Cephalosporin use.   . Sulfa Antibiotics     Consultations:   None  Other Procedures/Studies: Ct Head Wo Contrast  Result Date: 02/18/2018 CLINICAL DATA:  Altered mental status for the past few weeks with increased weakness. EXAM: CT HEAD WITHOUT CONTRAST TECHNIQUE: Contiguous axial images were obtained from the base of the skull through the vertex without intravenous contrast. COMPARISON:  09/05/2010 FINDINGS: BRAIN: There is sulcal and ventricular prominence consistent with superficial and central atrophy. No intraparenchymal hemorrhage, mass effect nor midline shift. Periventricular and subcortical white matter hypodensities consistent with chronic small vessel ischemic  disease are identified. No acute large vascular territory infarcts. No abnormal extra-axial fluid collections. Basal cisterns and fourth ventricle are not effaced and midline. VASCULAR: Moderate calcific atherosclerosis of the carotid siphons. SKULL: No skull fracture. No significant scalp soft tissue swelling. SINUSES/ORBITS: The mastoid air-cells are clear. The included paranasal sinuses demonstrate mild mucosal thickening of the included right maxillary and ethmoid sinuses. The included ocular globes and orbital contents are non-suspicious. OTHER: None. IMPRESSION: Atrophy with chronic appearing small vessel ischemia. No acute intracranial abnormality. Electronically Signed   By: Ashley Royalty M.D.   On: 02/18/2018 17:34   US Renal  Result Date: 02/18/2018 CLINICAL DATA:  Acute renal failure EXAM: RENAL / URINARY TRACT ULTRASOUND COMPLETE COMPARISON:  12/09/2017 FINDINGS: Right Kidney: Length: 12.9 cm. Acoustical window is difficult only minimal visualization of the right kidney is seen. No definitive hydronephrosis is noted. Left Kidney: Length: 11.9 cm. Cortical thinning is noted without hydronephrosis. Bladder: Decompressed IMPRESSION: Somewhat limited.  No hydronephrosis is seen. Electronically Signed   By: Inez Catalina M.D.   On: 02/18/2018 18:46   Dg Chest Port 1 View  Result Date: 02/18/2018 CLINICAL DATA:  Altered mental status. EXAM: PORTABLE CHEST 1 VIEW COMPARISON:  01/22/2018 FINDINGS: The cardiac silhouette remains enlarged. Aortic atherosclerosis is noted. The lungs are mildly hypoinflated. There is persistent mild pulmonary vascular congestion and interstitial prominence diffusely, not significantly changed. A small right pleural effusion is unchanged. No pneumothorax is identified. IMPRESSION: Unchanged small right pleural effusion, pulmonary vascular congestion, and suspected mild interstitial edema. Electronically Signed   By: Logan Bores M.D.   On: 02/18/2018 12:44     TODAY-DAY OF  DISCHARGE:  Subjective:   [name] today has no headache,no chest abdominal pain,no new weakness tingling or numbness, feels much better wants to go home today.   Objective:   Blood pressure (!) 142/71, pulse 71, temperature 98.6 F (37 C), temperature source Oral, resp. rate 14, height 6\' 2"  (1.88 m), weight 123.1 kg (271 lb 6.2 oz), SpO2 96 %.  Intake/Output Summary (Last 24 hours) at 02/21/2018 1043 Last data filed at 02/21/2018 0500 Gross per 24 hour  Intake 1243.17 ml  Output 850 ml  Net 393.17 ml   Filed Weights   02/18/18 1116 02/18/18 2028 02/21/18 0500  Weight: 113.4 kg (250 lb) 119.2 kg (262 lb 12.6 oz) 123.1 kg (271 lb 6.2 oz)    Exam: Awake Alert, Oriented *3, No new  F.N deficits, Normal affect [place].AT,PERRAL Supple Neck,No JVD, No cervical lymphadenopathy appriciated.  Symmetrical Chest wall movement, Good air movement bilaterally, CTAB RRR,No Gallops,Rubs or new Murmurs, No Parasternal Heave +ve B.Sounds, Abd Soft, Non tender, No organomegaly appriciated, No rebound -guarding or rigidity. No Cyanosis, Clubbing or edema, No new Rash or bruise   PERTINENT RADIOLOGIC STUDIES: Ct Head Wo Contrast  Result Date: 02/18/2018 CLINICAL DATA:  Altered mental status for the past few weeks with increased weakness. EXAM: CT HEAD WITHOUT CONTRAST TECHNIQUE: Contiguous axial images were obtained from the base of the skull through the vertex without intravenous contrast. COMPARISON:  09/05/2010 FINDINGS: BRAIN: There is sulcal and ventricular prominence consistent with superficial and central atrophy. No intraparenchymal hemorrhage, mass effect nor midline shift. Periventricular and subcortical white matter hypodensities consistent with chronic small vessel ischemic disease are identified. No acute large vascular territory infarcts. No abnormal extra-axial fluid collections. Basal cisterns and fourth ventricle are not effaced and midline. VASCULAR: Moderate calcific atherosclerosis  of the carotid siphons. SKULL: No skull fracture. No significant scalp soft tissue swelling. SINUSES/ORBITS: The mastoid air-cells are clear. The included paranasal sinuses demonstrate mild mucosal thickening of the included right maxillary and ethmoid sinuses. The included ocular globes and orbital contents are non-suspicious. OTHER: None. IMPRESSION: Atrophy with chronic appearing small vessel ischemia. No acute intracranial abnormality. Electronically Signed   By: Ashley Royalty M.D.   On: 02/18/2018 17:34   US Renal  Result Date: 02/18/2018 CLINICAL DATA:  Acute renal failure EXAM: RENAL / URINARY TRACT ULTRASOUND COMPLETE COMPARISON:  12/09/2017 FINDINGS: Right Kidney: Length: 12.9 cm. Acoustical window is difficult only minimal visualization of the right kidney is seen. No definitive hydronephrosis is noted. Left Kidney: Length: 11.9 cm. Cortical thinning is noted without hydronephrosis. Bladder: Decompressed IMPRESSION: Somewhat limited.  No hydronephrosis is seen. Electronically Signed   By: Inez Catalina M.D.   On: 02/18/2018 18:46   Dg Chest Port 1 View  Result Date: 02/18/2018 CLINICAL DATA:  Altered mental status. EXAM: PORTABLE CHEST 1 VIEW COMPARISON:  01/22/2018 FINDINGS: The cardiac silhouette remains enlarged. Aortic atherosclerosis is noted. The lungs are mildly hypoinflated. There is persistent mild pulmonary vascular congestion and interstitial prominence diffusely, not significantly changed. A small right pleural effusion is unchanged. No pneumothorax is identified. IMPRESSION: Unchanged small right pleural effusion, pulmonary vascular congestion, and suspected mild interstitial edema. Electronically Signed   By: Logan Bores M.D.   On: 02/18/2018 12:44     PERTINENT LAB RESULTS: CBC: Recent Labs    02/19/18 0615 02/20/18 0620  WBC 14.0* 8.3  HGB 8.9* 9.3*  HCT 26.9* 29.0*  PLT 233 245   CMET CMP     Component Value Date/Time   NA 137 02/21/2018 0745   K 4.1 02/21/2018  0745   CL 106 02/21/2018 0745   CO2 21 (L) 02/21/2018 0745   GLUCOSE 111 (H) 02/21/2018 0745   GLUCOSE 130 12/09/2010   BUN 61 (H) 02/21/2018 0745   CREATININE 2.54 (H) 02/21/2018 0745   CALCIUM 8.0 (L) 02/21/2018 0745   PROT 6.1 (L) 02/19/2018 0615   ALBUMIN 2.3 (L) 02/20/2018 0620   AST 27 02/19/2018 0615   ALT 17 02/19/2018 0615   ALKPHOS 82 02/19/2018 0615   BILITOT 0.5 02/19/2018 0615   GFRNONAA 23 (L) 02/21/2018 0745   GFRAA 27 (L) 02/21/2018 0745    GFR Estimated Creatinine Clearance: 35.6 mL/min (A) (by C-G formula based on SCr of 2.54 mg/dL (H)). No results for input(s): LIPASE, AMYLASE  in the last 72 hours. No results for input(s): CKTOTAL, CKMB, CKMBINDEX, TROPONINI in the last 72 hours. Invalid input(s): POCBNP No results for input(s): DDIMER in the last 72 hours. Recent Labs    02/19/18 0615  HGBA1C 6.4*   No results for input(s): CHOL, HDL, LDLCALC, TRIG, CHOLHDL, LDLDIRECT in the last 72 hours. Recent Labs    02/18/18 2019  TSH 5.247*   Recent Labs    02/19/18 0615 02/19/18 0843  VITAMINB12  --  410  FOLATE  --  9.1  FERRITIN  --  177  TIBC  --  161*  IRON  --  14*  RETICCTPCT 0.4  --    Coags: Recent Labs    02/19/18 0615  INR 1.42   Microbiology: Recent Results (from the past 240 hour(s))  Culture, blood (Routine X 2) w Reflex to ID Panel     Status: None (Preliminary result)   Collection Time: 02/18/18  8:18 PM  Result Value Ref Range Status   Specimen Description   Final    BLOOD RIGHT ARM Performed at [facility] [address] [place], [place] [address]    Special Requests   Final    BOTTLES DRAWN AEROBIC AND ANAEROBIC Blood Culture adequate volume   Culture   Final    NO GROWTH 2 DAYS Performed at [facility], [place] [address], [place], [place] [address]    Report Status PENDING  Incomplete  Culture, blood (Routine X 2) w Reflex to ID Panel     Status: None (Preliminary result)   Collection Time:  02/18/18  8:24 PM  Result Value Ref Range Status   Specimen Description   Final    BLOOD RIGHT ARM Performed at [facility] [address] [place], [place] [address]    Special Requests   Final    BAA BCAV Performed at [facility] [address], [place], [place] [address]    Culture   Final    NO GROWTH 2 DAYS Performed at [facility] [address], [place], [place] [address]    Report Status PENDING  Incomplete    FURTHER DISCHARGE INSTRUCTIONS:  Get Medicines reviewed and adjusted: Please take all your medications with you for your next visit with your Primary MD  Laboratory/radiological data: Please request your Primary MD to go over all hospital tests and procedure/radiological results at the follow up, please ask your Primary MD to get all Hospital records sent to his/her office.  In some cases, they will be blood work, cultures and biopsy results pending at the time of your discharge. Please request that your primary care M.D. goes through all the records of your hospital data and follows up on these results.  Also Note the following: If you experience worsening of your admission symptoms, develop shortness of breath, life threatening emergency, suicidal or homicidal thoughts you must seek medical attention immediately by calling 911 or calling your MD immediately  if symptoms less severe.  You must read complete instructions/literature along with all the possible adverse reactions/side effects for all the Medicines you take and that have been prescribed to you. Take any new Medicines after you have completely understood and accpet all the possible adverse reactions/side effects.   Do not drive when taking Pain medications or sleeping medications (Benzodaizepines)  Do not take more than prescribed Pain, Sleep and Anxiety Medications. It is not advisable to combine anxiety,sleep and pain medications without talking  with your  primary care practitioner  Special Instructions: If you have smoked or chewed Tobacco  in the last 2 yrs please stop smoking, stop any regular Alcohol  and or any Recreational drug use.  Wear Seat belts while driving.  Please note: You were cared for by a hospitalist during your hospital stay. Once you are discharged, your primary care physician will handle any further medical issues. Please note that NO REFILLS for any discharge medications will be authorized once you are discharged, as it is imperative that you return to your primary care physician (or establish a relationship with a primary care physician if you do not have one) for your post hospital discharge needs so that they can reassess your need for medications and monitor your lab values.  Total Time spent coordinating discharge including counseling, education and face to face time equals  45 minutes.  SignedOren Binet 02/21/2018 10:43 AM

## 2018-02-21 NOTE — Care Management Important Message (Signed)
Important Message  Patient Details  Name: [name] MRN: [id number] Date of Birth: July 24, 1941   Medicare Important Message Given:  Yes    Kerin Salen 02/21/2018, 11:09 AMImportant Message  Patient Details  Name: [name] MRN: [id number] Date of Birth: 09-06-1941   Medicare Important Message Given:  Yes    Kerin Salen 02/21/2018, 11:09 AM

## 2018-02-21 NOTE — Progress Notes (Signed)
Patient does not have a bed today.   Facility can accept patient tomorrow.   LCSW notified patient and spouse of bed availability.   [name]

## 2018-02-22 DIAGNOSIS — N189 Chronic kidney disease, unspecified: Secondary | ICD-10-CM | POA: Diagnosis not present

## 2018-02-22 DIAGNOSIS — L03115 Cellulitis of right lower limb: Secondary | ICD-10-CM | POA: Diagnosis not present

## 2018-02-22 DIAGNOSIS — J069 Acute upper respiratory infection, unspecified: Secondary | ICD-10-CM | POA: Diagnosis not present

## 2018-02-22 DIAGNOSIS — R159 Full incontinence of feces: Secondary | ICD-10-CM | POA: Diagnosis not present

## 2018-02-22 DIAGNOSIS — L0390 Cellulitis, unspecified: Secondary | ICD-10-CM | POA: Diagnosis not present

## 2018-02-22 DIAGNOSIS — N390 Urinary tract infection, site not specified: Secondary | ICD-10-CM | POA: Diagnosis not present

## 2018-02-22 DIAGNOSIS — L8990 Pressure ulcer of unspecified site, unspecified stage: Secondary | ICD-10-CM | POA: Diagnosis not present

## 2018-02-22 DIAGNOSIS — R262 Difficulty in walking, not elsewhere classified: Secondary | ICD-10-CM | POA: Diagnosis not present

## 2018-02-22 DIAGNOSIS — E119 Type 2 diabetes mellitus without complications: Secondary | ICD-10-CM | POA: Diagnosis not present

## 2018-02-22 DIAGNOSIS — R21 Rash and other nonspecific skin eruption: Secondary | ICD-10-CM | POA: Diagnosis not present

## 2018-02-22 DIAGNOSIS — N183 Chronic kidney disease, stage 3 (moderate): Secondary | ICD-10-CM | POA: Diagnosis not present

## 2018-02-22 DIAGNOSIS — R41841 Cognitive communication deficit: Secondary | ICD-10-CM | POA: Diagnosis not present

## 2018-02-22 DIAGNOSIS — M6281 Muscle weakness (generalized): Secondary | ICD-10-CM | POA: Diagnosis not present

## 2018-02-22 DIAGNOSIS — R0602 Shortness of breath: Secondary | ICD-10-CM | POA: Diagnosis not present

## 2018-02-22 DIAGNOSIS — I878 Other specified disorders of veins: Secondary | ICD-10-CM | POA: Diagnosis not present

## 2018-02-22 DIAGNOSIS — R498 Other voice and resonance disorders: Secondary | ICD-10-CM | POA: Diagnosis not present

## 2018-02-22 DIAGNOSIS — B3789 Other sites of candidiasis: Secondary | ICD-10-CM | POA: Diagnosis not present

## 2018-02-22 DIAGNOSIS — R05 Cough: Secondary | ICD-10-CM | POA: Diagnosis not present

## 2018-02-22 DIAGNOSIS — R2681 Unsteadiness on feet: Secondary | ICD-10-CM | POA: Diagnosis not present

## 2018-02-22 DIAGNOSIS — R197 Diarrhea, unspecified: Secondary | ICD-10-CM | POA: Diagnosis not present

## 2018-02-22 DIAGNOSIS — R410 Disorientation, unspecified: Secondary | ICD-10-CM | POA: Diagnosis not present

## 2018-02-22 DIAGNOSIS — I509 Heart failure, unspecified: Secondary | ICD-10-CM | POA: Diagnosis not present

## 2018-02-22 DIAGNOSIS — G9341 Metabolic encephalopathy: Secondary | ICD-10-CM | POA: Diagnosis not present

## 2018-02-22 LAB — GLUCOSE, CAPILLARY: GLUCOSE-CAPILLARY: 126 mg/dL — AB (ref 65–99)

## 2018-02-22 NOTE — Clinical Social Work Placement (Signed)
   11:56 AM Patient and family chose bed at [facility] confirmed bed with facility.  Patient will transport by [facility]. Patient can transfer at [facility] faxed dc docs to facility.  LCSW notified family of transfer.  RN report number: [id number]  [facility]  CLINICAL SOCIAL WORK PLACEMENT  NOTE  Date:  02/22/2018  Patient Details  Name: [name] MRN: [id number] Date of Birth: 24-Apr-1941  Clinical Social Work is seeking post-discharge placement for this patient at the [place] level of care (*CSW will initial, date and re-position this form in  chart as items are completed):  Yes   Patient/family provided with [facility] Work Department's list of facilities offering this level of care within the geographic area requested by the patient (or if unable, by the patient's family).  Yes   Patient/family informed of their freedom to choose among providers that offer the needed level of care, that participate in Medicare, Medicaid or managed care program needed by the patient, have an available bed and are willing to accept the patient.  Yes   Patient/family informed of [facility]'s ownership interest in [facility] and [facility], as well as of the fact that they are under no obligation to receive care at these facilities.  PASRR submitted to EDS on       PASRR number received on 02/21/18     Existing PASRR number confirmed on       FL2 transmitted to all facilities in geographic area requested by pt/family on 02/21/18     FL2 transmitted to all facilities within larger geographic area on       Patient informed that his/her managed care company has contracts with or will negotiate with certain facilities, including the following:        Yes   Patient/family informed of bed offers received.  Patient chooses bed at [facility]     Physician recommends and patient chooses bed at      Patient to be transferred to [facility]  on 02/22/18.  Patient to be transferred to facility by [facility]     Patient family notified on 02/22/18 of transfer.  Name of family member notified:  [name]     PHYSICIAN       Additional Comment:    _______________________________________________ Servando Snare, LCSW 02/22/2018, 11:56 AM

## 2018-02-22 NOTE — Progress Notes (Signed)
Pharmacy Antibiotic Note  [name] is a 77 y.o. male admitted on 02/18/2018 with cellulitis.  He has been receiving doxycycline as an outpatient with new cellulitis, altered mental status and acute kidney injury.  Pharmacy has been consulted for Vancomycin dosing.    Today, 02/22/2018:  Afebrile  WBC improved to WNL  AKI- Scr 2.54, est NCrCl ~26 ml/min  Plan:  Vancomycin 1750mg  IV q48 (target AUC 400-500)  Rocephin 2gm IV q24h per MD  Daily Scr  Monitor renal function and cx data   MD notes plan for discharge on Doxycyline today.  Will not check levels or adjust dosing if planning to discharge prior to next vancomycin dose due at 20:00 tonight.  Height: 6\' 2"  (188 cm) Weight: 276 lb 14.4 oz (125.6 kg) IBW/kg (Calculated) : 82.2  Temp (24hrs), Avg:98 F (36.7 C), Min:97.6 F (36.4 C), Max:98.2 F (36.8 C)  Recent Labs  Lab 02/18/18 1226 02/19/18 0615 02/20/18 0620 02/21/18 0745  WBC 19.1* 14.0* 8.3  --   CREATININE 2.40* 2.24* 2.45* 2.54*    Estimated Creatinine Clearance: 35.9 mL/min (A) (by C-G formula based on SCr of 2.54 mg/dL (H)).    Allergies  Allergen Reactions  . Penicillins     Has patient had a PCN reaction causing immediate rash, facial/tongue/throat swelling, SOB or lightheadedness with hypotension: Unknown Has patient had a PCN reaction causing severe rash involving mucus membranes or skin necrosis:  /Unknown Has patient had a PCN reaction that required hospitalization: Unknown Has patient had a PCN reaction occurring within the last 10 years: Unknown If all of the above answers are "NO", then may proceed with Cephalosporin use.   . Sulfa Antibiotics     Antimicrobials this admission: 3/11 Rocephin >>  3/11 Vancomycin >>   Dose adjustments this admission:  Microbiology results: 3/11 BCx: ngtd  Thank you for allowing pharmacy to be a part of this patient's care.  Gretta Arab PharmD, BCPS Pager [phone] 02/22/2018 9:35 AM

## 2018-02-22 NOTE — Progress Notes (Signed)
Patient was briefly seen and examined No major issues overnight per patient, also spoke with patient's RN-no major issues. Awaiting bed at SNF Remains stable for discharge.

## 2018-02-23 LAB — CULTURE, BLOOD (ROUTINE X 2)
CULTURE: NO GROWTH
CULTURE: NO GROWTH
Special Requests: ADEQUATE

## 2018-02-25 DIAGNOSIS — E119 Type 2 diabetes mellitus without complications: Secondary | ICD-10-CM | POA: Diagnosis not present

## 2018-02-25 DIAGNOSIS — L0390 Cellulitis, unspecified: Secondary | ICD-10-CM | POA: Diagnosis not present

## 2018-02-25 DIAGNOSIS — I878 Other specified disorders of veins: Secondary | ICD-10-CM | POA: Diagnosis not present

## 2018-02-25 DIAGNOSIS — L8990 Pressure ulcer of unspecified site, unspecified stage: Secondary | ICD-10-CM | POA: Diagnosis not present

## 2018-02-26 DIAGNOSIS — L0390 Cellulitis, unspecified: Secondary | ICD-10-CM | POA: Diagnosis not present

## 2018-02-26 DIAGNOSIS — E119 Type 2 diabetes mellitus without complications: Secondary | ICD-10-CM | POA: Diagnosis not present

## 2018-02-26 DIAGNOSIS — I878 Other specified disorders of veins: Secondary | ICD-10-CM | POA: Diagnosis not present

## 2018-02-26 DIAGNOSIS — L8990 Pressure ulcer of unspecified site, unspecified stage: Secondary | ICD-10-CM | POA: Diagnosis not present

## 2018-03-11 DIAGNOSIS — B3789 Other sites of candidiasis: Secondary | ICD-10-CM | POA: Diagnosis not present

## 2018-03-11 DIAGNOSIS — R21 Rash and other nonspecific skin eruption: Secondary | ICD-10-CM | POA: Diagnosis not present

## 2018-03-20 DIAGNOSIS — R21 Rash and other nonspecific skin eruption: Secondary | ICD-10-CM | POA: Diagnosis not present

## 2018-03-20 DIAGNOSIS — I509 Heart failure, unspecified: Secondary | ICD-10-CM | POA: Diagnosis not present

## 2018-03-25 DIAGNOSIS — N189 Chronic kidney disease, unspecified: Secondary | ICD-10-CM | POA: Diagnosis not present

## 2018-03-25 DIAGNOSIS — I878 Other specified disorders of veins: Secondary | ICD-10-CM | POA: Diagnosis not present

## 2018-03-25 DIAGNOSIS — R21 Rash and other nonspecific skin eruption: Secondary | ICD-10-CM | POA: Diagnosis not present

## 2018-03-25 DIAGNOSIS — I509 Heart failure, unspecified: Secondary | ICD-10-CM | POA: Diagnosis not present

## 2018-03-27 DIAGNOSIS — N189 Chronic kidney disease, unspecified: Secondary | ICD-10-CM | POA: Diagnosis not present

## 2018-03-27 DIAGNOSIS — I509 Heart failure, unspecified: Secondary | ICD-10-CM | POA: Diagnosis not present

## 2018-03-27 DIAGNOSIS — R197 Diarrhea, unspecified: Secondary | ICD-10-CM | POA: Diagnosis not present

## 2018-03-27 DIAGNOSIS — R159 Full incontinence of feces: Secondary | ICD-10-CM | POA: Diagnosis not present

## 2018-04-02 DIAGNOSIS — I509 Heart failure, unspecified: Secondary | ICD-10-CM | POA: Diagnosis not present

## 2018-04-02 DIAGNOSIS — R05 Cough: Secondary | ICD-10-CM | POA: Diagnosis not present

## 2018-04-02 DIAGNOSIS — N189 Chronic kidney disease, unspecified: Secondary | ICD-10-CM | POA: Diagnosis not present

## 2018-04-02 DIAGNOSIS — R197 Diarrhea, unspecified: Secondary | ICD-10-CM | POA: Diagnosis not present

## 2018-04-08 DIAGNOSIS — I509 Heart failure, unspecified: Secondary | ICD-10-CM | POA: Diagnosis not present

## 2018-04-08 DIAGNOSIS — R197 Diarrhea, unspecified: Secondary | ICD-10-CM | POA: Diagnosis not present

## 2018-04-08 DIAGNOSIS — R0602 Shortness of breath: Secondary | ICD-10-CM | POA: Diagnosis not present

## 2018-04-12 DIAGNOSIS — R410 Disorientation, unspecified: Secondary | ICD-10-CM | POA: Diagnosis not present

## 2018-04-12 DIAGNOSIS — I878 Other specified disorders of veins: Secondary | ICD-10-CM | POA: Diagnosis not present

## 2018-04-12 DIAGNOSIS — I509 Heart failure, unspecified: Secondary | ICD-10-CM | POA: Diagnosis not present

## 2018-04-15 DIAGNOSIS — N390 Urinary tract infection, site not specified: Secondary | ICD-10-CM | POA: Diagnosis not present

## 2018-04-22 DIAGNOSIS — J069 Acute upper respiratory infection, unspecified: Secondary | ICD-10-CM | POA: Diagnosis not present

## 2018-04-24 DIAGNOSIS — J069 Acute upper respiratory infection, unspecified: Secondary | ICD-10-CM | POA: Diagnosis not present

## 2018-04-25 DIAGNOSIS — I878 Other specified disorders of veins: Secondary | ICD-10-CM | POA: Diagnosis not present

## 2018-04-25 DIAGNOSIS — E119 Type 2 diabetes mellitus without complications: Secondary | ICD-10-CM | POA: Diagnosis not present

## 2018-04-25 DIAGNOSIS — R159 Full incontinence of feces: Secondary | ICD-10-CM | POA: Diagnosis not present

## 2018-04-25 DIAGNOSIS — J069 Acute upper respiratory infection, unspecified: Secondary | ICD-10-CM | POA: Diagnosis not present

## 2018-04-29 DIAGNOSIS — D631 Anemia in chronic kidney disease: Secondary | ICD-10-CM | POA: Diagnosis not present

## 2018-04-29 DIAGNOSIS — N2581 Secondary hyperparathyroidism of renal origin: Secondary | ICD-10-CM | POA: Diagnosis not present

## 2018-04-29 DIAGNOSIS — I129 Hypertensive chronic kidney disease with stage 1 through stage 4 chronic kidney disease, or unspecified chronic kidney disease: Secondary | ICD-10-CM | POA: Diagnosis not present

## 2018-04-29 DIAGNOSIS — N183 Chronic kidney disease, stage 3 (moderate): Secondary | ICD-10-CM | POA: Diagnosis not present

## 2018-04-30 DIAGNOSIS — N183 Chronic kidney disease, stage 3 (moderate): Secondary | ICD-10-CM | POA: Diagnosis not present

## 2018-04-30 DIAGNOSIS — I519 Heart disease, unspecified: Secondary | ICD-10-CM | POA: Diagnosis not present

## 2018-04-30 DIAGNOSIS — G629 Polyneuropathy, unspecified: Secondary | ICD-10-CM | POA: Diagnosis not present

## 2018-04-30 DIAGNOSIS — I129 Hypertensive chronic kidney disease with stage 1 through stage 4 chronic kidney disease, or unspecified chronic kidney disease: Secondary | ICD-10-CM | POA: Diagnosis not present

## 2018-04-30 DIAGNOSIS — R5381 Other malaise: Secondary | ICD-10-CM | POA: Diagnosis not present

## 2018-05-01 DIAGNOSIS — G9341 Metabolic encephalopathy: Secondary | ICD-10-CM | POA: Diagnosis not present

## 2018-05-01 DIAGNOSIS — R262 Difficulty in walking, not elsewhere classified: Secondary | ICD-10-CM | POA: Diagnosis not present

## 2018-05-01 DIAGNOSIS — M6281 Muscle weakness (generalized): Secondary | ICD-10-CM | POA: Diagnosis not present

## 2018-05-01 DIAGNOSIS — N183 Chronic kidney disease, stage 3 (moderate): Secondary | ICD-10-CM | POA: Diagnosis not present

## 2018-05-01 DIAGNOSIS — R498 Other voice and resonance disorders: Secondary | ICD-10-CM | POA: Diagnosis not present

## 2018-05-01 DIAGNOSIS — R41841 Cognitive communication deficit: Secondary | ICD-10-CM | POA: Diagnosis not present

## 2018-05-01 DIAGNOSIS — R2681 Unsteadiness on feet: Secondary | ICD-10-CM | POA: Diagnosis not present

## 2018-05-01 DIAGNOSIS — L03115 Cellulitis of right lower limb: Secondary | ICD-10-CM | POA: Diagnosis not present

## 2018-05-07 DIAGNOSIS — R2681 Unsteadiness on feet: Secondary | ICD-10-CM | POA: Diagnosis not present

## 2018-05-07 DIAGNOSIS — N183 Chronic kidney disease, stage 3 (moderate): Secondary | ICD-10-CM | POA: Diagnosis not present

## 2018-05-07 DIAGNOSIS — L03115 Cellulitis of right lower limb: Secondary | ICD-10-CM | POA: Diagnosis not present

## 2018-05-07 DIAGNOSIS — R262 Difficulty in walking, not elsewhere classified: Secondary | ICD-10-CM | POA: Diagnosis not present

## 2018-05-07 DIAGNOSIS — M6281 Muscle weakness (generalized): Secondary | ICD-10-CM | POA: Diagnosis not present

## 2018-05-07 DIAGNOSIS — G9341 Metabolic encephalopathy: Secondary | ICD-10-CM | POA: Diagnosis not present

## 2018-05-09 DIAGNOSIS — N183 Chronic kidney disease, stage 3 (moderate): Secondary | ICD-10-CM | POA: Diagnosis not present

## 2018-05-09 DIAGNOSIS — M6281 Muscle weakness (generalized): Secondary | ICD-10-CM | POA: Diagnosis not present

## 2018-05-09 DIAGNOSIS — R262 Difficulty in walking, not elsewhere classified: Secondary | ICD-10-CM | POA: Diagnosis not present

## 2018-05-09 DIAGNOSIS — L03115 Cellulitis of right lower limb: Secondary | ICD-10-CM | POA: Diagnosis not present

## 2018-05-09 DIAGNOSIS — G9341 Metabolic encephalopathy: Secondary | ICD-10-CM | POA: Diagnosis not present

## 2018-05-09 DIAGNOSIS — R2681 Unsteadiness on feet: Secondary | ICD-10-CM | POA: Diagnosis not present

## 2018-05-16 DIAGNOSIS — L03115 Cellulitis of right lower limb: Secondary | ICD-10-CM | POA: Diagnosis not present

## 2018-05-16 DIAGNOSIS — R262 Difficulty in walking, not elsewhere classified: Secondary | ICD-10-CM | POA: Diagnosis not present

## 2018-05-16 DIAGNOSIS — R41841 Cognitive communication deficit: Secondary | ICD-10-CM | POA: Diagnosis not present

## 2018-05-16 DIAGNOSIS — R498 Other voice and resonance disorders: Secondary | ICD-10-CM | POA: Diagnosis not present

## 2018-05-16 DIAGNOSIS — N183 Chronic kidney disease, stage 3 (moderate): Secondary | ICD-10-CM | POA: Diagnosis not present

## 2018-05-16 DIAGNOSIS — G9341 Metabolic encephalopathy: Secondary | ICD-10-CM | POA: Diagnosis not present

## 2018-05-16 DIAGNOSIS — M6281 Muscle weakness (generalized): Secondary | ICD-10-CM | POA: Diagnosis not present

## 2018-05-16 DIAGNOSIS — R2681 Unsteadiness on feet: Secondary | ICD-10-CM | POA: Diagnosis not present

## 2018-05-21 DIAGNOSIS — L03115 Cellulitis of right lower limb: Secondary | ICD-10-CM | POA: Diagnosis not present

## 2018-05-21 DIAGNOSIS — N183 Chronic kidney disease, stage 3 (moderate): Secondary | ICD-10-CM | POA: Diagnosis not present

## 2018-05-21 DIAGNOSIS — R2681 Unsteadiness on feet: Secondary | ICD-10-CM | POA: Diagnosis not present

## 2018-05-21 DIAGNOSIS — R262 Difficulty in walking, not elsewhere classified: Secondary | ICD-10-CM | POA: Diagnosis not present

## 2018-05-21 DIAGNOSIS — G9341 Metabolic encephalopathy: Secondary | ICD-10-CM | POA: Diagnosis not present

## 2018-05-21 DIAGNOSIS — M6281 Muscle weakness (generalized): Secondary | ICD-10-CM | POA: Diagnosis not present

## 2018-05-23 DIAGNOSIS — M6281 Muscle weakness (generalized): Secondary | ICD-10-CM | POA: Diagnosis not present

## 2018-05-23 DIAGNOSIS — R262 Difficulty in walking, not elsewhere classified: Secondary | ICD-10-CM | POA: Diagnosis not present

## 2018-05-23 DIAGNOSIS — G9341 Metabolic encephalopathy: Secondary | ICD-10-CM | POA: Diagnosis not present

## 2018-05-23 DIAGNOSIS — L03115 Cellulitis of right lower limb: Secondary | ICD-10-CM | POA: Diagnosis not present

## 2018-05-23 DIAGNOSIS — N183 Chronic kidney disease, stage 3 (moderate): Secondary | ICD-10-CM | POA: Diagnosis not present

## 2018-05-23 DIAGNOSIS — R2681 Unsteadiness on feet: Secondary | ICD-10-CM | POA: Diagnosis not present

## 2018-05-28 DIAGNOSIS — R2681 Unsteadiness on feet: Secondary | ICD-10-CM | POA: Diagnosis not present

## 2018-05-28 DIAGNOSIS — M6281 Muscle weakness (generalized): Secondary | ICD-10-CM | POA: Diagnosis not present

## 2018-05-28 DIAGNOSIS — R262 Difficulty in walking, not elsewhere classified: Secondary | ICD-10-CM | POA: Diagnosis not present

## 2018-05-28 DIAGNOSIS — L03115 Cellulitis of right lower limb: Secondary | ICD-10-CM | POA: Diagnosis not present

## 2018-05-28 DIAGNOSIS — G9341 Metabolic encephalopathy: Secondary | ICD-10-CM | POA: Diagnosis not present

## 2018-05-28 DIAGNOSIS — N183 Chronic kidney disease, stage 3 (moderate): Secondary | ICD-10-CM | POA: Diagnosis not present

## 2018-05-30 DIAGNOSIS — G9341 Metabolic encephalopathy: Secondary | ICD-10-CM | POA: Diagnosis not present

## 2018-05-30 DIAGNOSIS — N183 Chronic kidney disease, stage 3 (moderate): Secondary | ICD-10-CM | POA: Diagnosis not present

## 2018-05-30 DIAGNOSIS — R262 Difficulty in walking, not elsewhere classified: Secondary | ICD-10-CM | POA: Diagnosis not present

## 2018-05-30 DIAGNOSIS — R2681 Unsteadiness on feet: Secondary | ICD-10-CM | POA: Diagnosis not present

## 2018-05-30 DIAGNOSIS — L03115 Cellulitis of right lower limb: Secondary | ICD-10-CM | POA: Diagnosis not present

## 2018-05-30 DIAGNOSIS — M6281 Muscle weakness (generalized): Secondary | ICD-10-CM | POA: Diagnosis not present

## 2018-06-04 DIAGNOSIS — R2681 Unsteadiness on feet: Secondary | ICD-10-CM | POA: Diagnosis not present

## 2018-06-04 DIAGNOSIS — L03115 Cellulitis of right lower limb: Secondary | ICD-10-CM | POA: Diagnosis not present

## 2018-06-04 DIAGNOSIS — M6281 Muscle weakness (generalized): Secondary | ICD-10-CM | POA: Diagnosis not present

## 2018-06-04 DIAGNOSIS — N183 Chronic kidney disease, stage 3 (moderate): Secondary | ICD-10-CM | POA: Diagnosis not present

## 2018-06-04 DIAGNOSIS — R262 Difficulty in walking, not elsewhere classified: Secondary | ICD-10-CM | POA: Diagnosis not present

## 2018-06-04 DIAGNOSIS — G9341 Metabolic encephalopathy: Secondary | ICD-10-CM | POA: Diagnosis not present

## 2018-06-06 DIAGNOSIS — M6281 Muscle weakness (generalized): Secondary | ICD-10-CM | POA: Diagnosis not present

## 2018-06-06 DIAGNOSIS — L03115 Cellulitis of right lower limb: Secondary | ICD-10-CM | POA: Diagnosis not present

## 2018-06-06 DIAGNOSIS — R262 Difficulty in walking, not elsewhere classified: Secondary | ICD-10-CM | POA: Diagnosis not present

## 2018-06-06 DIAGNOSIS — N183 Chronic kidney disease, stage 3 (moderate): Secondary | ICD-10-CM | POA: Diagnosis not present

## 2018-06-06 DIAGNOSIS — R2681 Unsteadiness on feet: Secondary | ICD-10-CM | POA: Diagnosis not present

## 2018-06-06 DIAGNOSIS — G9341 Metabolic encephalopathy: Secondary | ICD-10-CM | POA: Diagnosis not present

## 2018-06-07 DIAGNOSIS — H10523 Angular blepharoconjunctivitis, bilateral: Secondary | ICD-10-CM | POA: Diagnosis not present

## 2018-06-07 DIAGNOSIS — H5789 Other specified disorders of eye and adnexa: Secondary | ICD-10-CM | POA: Diagnosis not present

## 2018-06-11 DIAGNOSIS — R2681 Unsteadiness on feet: Secondary | ICD-10-CM | POA: Diagnosis not present

## 2018-06-11 DIAGNOSIS — R262 Difficulty in walking, not elsewhere classified: Secondary | ICD-10-CM | POA: Diagnosis not present

## 2018-06-11 DIAGNOSIS — R2689 Other abnormalities of gait and mobility: Secondary | ICD-10-CM | POA: Diagnosis not present

## 2018-06-11 DIAGNOSIS — L03115 Cellulitis of right lower limb: Secondary | ICD-10-CM | POA: Diagnosis not present

## 2018-06-11 DIAGNOSIS — G9341 Metabolic encephalopathy: Secondary | ICD-10-CM | POA: Diagnosis not present

## 2018-06-11 DIAGNOSIS — R498 Other voice and resonance disorders: Secondary | ICD-10-CM | POA: Diagnosis not present

## 2018-06-11 DIAGNOSIS — R41841 Cognitive communication deficit: Secondary | ICD-10-CM | POA: Diagnosis not present

## 2018-06-11 DIAGNOSIS — N183 Chronic kidney disease, stage 3 (moderate): Secondary | ICD-10-CM | POA: Diagnosis not present

## 2018-06-11 DIAGNOSIS — M6281 Muscle weakness (generalized): Secondary | ICD-10-CM | POA: Diagnosis not present

## 2018-06-17 DIAGNOSIS — H01004 Unspecified blepharitis left upper eyelid: Secondary | ICD-10-CM | POA: Diagnosis not present

## 2018-06-17 DIAGNOSIS — H01001 Unspecified blepharitis right upper eyelid: Secondary | ICD-10-CM | POA: Diagnosis not present

## 2018-06-17 DIAGNOSIS — H16203 Unspecified keratoconjunctivitis, bilateral: Secondary | ICD-10-CM | POA: Diagnosis not present

## 2018-06-17 DIAGNOSIS — H01002 Unspecified blepharitis right lower eyelid: Secondary | ICD-10-CM | POA: Diagnosis not present

## 2018-06-26 DIAGNOSIS — I129 Hypertensive chronic kidney disease with stage 1 through stage 4 chronic kidney disease, or unspecified chronic kidney disease: Secondary | ICD-10-CM | POA: Diagnosis not present

## 2018-06-26 DIAGNOSIS — E1121 Type 2 diabetes mellitus with diabetic nephropathy: Secondary | ICD-10-CM | POA: Diagnosis not present

## 2018-06-26 DIAGNOSIS — N183 Chronic kidney disease, stage 3 (moderate): Secondary | ICD-10-CM | POA: Diagnosis not present

## 2018-06-26 DIAGNOSIS — D638 Anemia in other chronic diseases classified elsewhere: Secondary | ICD-10-CM | POA: Diagnosis not present

## 2018-06-26 DIAGNOSIS — Z993 Dependence on wheelchair: Secondary | ICD-10-CM | POA: Diagnosis not present

## 2018-06-26 DIAGNOSIS — E782 Mixed hyperlipidemia: Secondary | ICD-10-CM | POA: Diagnosis not present

## 2018-06-26 DIAGNOSIS — E1140 Type 2 diabetes mellitus with diabetic neuropathy, unspecified: Secondary | ICD-10-CM | POA: Diagnosis not present

## 2018-07-17 DIAGNOSIS — F419 Anxiety disorder, unspecified: Secondary | ICD-10-CM | POA: Diagnosis not present

## 2018-07-17 DIAGNOSIS — E11621 Type 2 diabetes mellitus with foot ulcer: Secondary | ICD-10-CM | POA: Diagnosis not present

## 2018-07-17 DIAGNOSIS — I509 Heart failure, unspecified: Secondary | ICD-10-CM | POA: Diagnosis not present

## 2018-07-17 DIAGNOSIS — E11622 Type 2 diabetes mellitus with other skin ulcer: Secondary | ICD-10-CM | POA: Diagnosis not present

## 2018-07-17 DIAGNOSIS — L97422 Non-pressure chronic ulcer of left heel and midfoot with fat layer exposed: Secondary | ICD-10-CM | POA: Diagnosis not present

## 2018-07-17 DIAGNOSIS — L97211 Non-pressure chronic ulcer of right calf limited to breakdown of skin: Secondary | ICD-10-CM | POA: Diagnosis not present

## 2018-07-17 DIAGNOSIS — E1140 Type 2 diabetes mellitus with diabetic neuropathy, unspecified: Secondary | ICD-10-CM | POA: Diagnosis not present

## 2018-07-17 DIAGNOSIS — Z7982 Long term (current) use of aspirin: Secondary | ICD-10-CM | POA: Diagnosis not present

## 2018-07-17 DIAGNOSIS — D631 Anemia in chronic kidney disease: Secondary | ICD-10-CM | POA: Diagnosis not present

## 2018-07-17 DIAGNOSIS — E1122 Type 2 diabetes mellitus with diabetic chronic kidney disease: Secondary | ICD-10-CM | POA: Diagnosis not present

## 2018-07-17 DIAGNOSIS — E782 Mixed hyperlipidemia: Secondary | ICD-10-CM | POA: Diagnosis not present

## 2018-07-17 DIAGNOSIS — I130 Hypertensive heart and chronic kidney disease with heart failure and stage 1 through stage 4 chronic kidney disease, or unspecified chronic kidney disease: Secondary | ICD-10-CM | POA: Diagnosis not present

## 2018-07-17 DIAGNOSIS — N183 Chronic kidney disease, stage 3 (moderate): Secondary | ICD-10-CM | POA: Diagnosis not present

## 2018-07-17 DIAGNOSIS — Z86718 Personal history of other venous thrombosis and embolism: Secondary | ICD-10-CM | POA: Diagnosis not present

## 2018-07-17 DIAGNOSIS — L97221 Non-pressure chronic ulcer of left calf limited to breakdown of skin: Secondary | ICD-10-CM | POA: Diagnosis not present

## 2018-07-17 DIAGNOSIS — G4733 Obstructive sleep apnea (adult) (pediatric): Secondary | ICD-10-CM | POA: Diagnosis not present

## 2018-07-17 DIAGNOSIS — Z8701 Personal history of pneumonia (recurrent): Secondary | ICD-10-CM | POA: Diagnosis not present

## 2018-07-17 DIAGNOSIS — Z993 Dependence on wheelchair: Secondary | ICD-10-CM | POA: Diagnosis not present

## 2018-07-17 DIAGNOSIS — F329 Major depressive disorder, single episode, unspecified: Secondary | ICD-10-CM | POA: Diagnosis not present

## 2018-07-19 DIAGNOSIS — I130 Hypertensive heart and chronic kidney disease with heart failure and stage 1 through stage 4 chronic kidney disease, or unspecified chronic kidney disease: Secondary | ICD-10-CM | POA: Diagnosis not present

## 2018-07-19 DIAGNOSIS — E11622 Type 2 diabetes mellitus with other skin ulcer: Secondary | ICD-10-CM | POA: Diagnosis not present

## 2018-07-19 DIAGNOSIS — L97422 Non-pressure chronic ulcer of left heel and midfoot with fat layer exposed: Secondary | ICD-10-CM | POA: Diagnosis not present

## 2018-07-19 DIAGNOSIS — E11621 Type 2 diabetes mellitus with foot ulcer: Secondary | ICD-10-CM | POA: Diagnosis not present

## 2018-07-19 DIAGNOSIS — L97221 Non-pressure chronic ulcer of left calf limited to breakdown of skin: Secondary | ICD-10-CM | POA: Diagnosis not present

## 2018-07-19 DIAGNOSIS — L97211 Non-pressure chronic ulcer of right calf limited to breakdown of skin: Secondary | ICD-10-CM | POA: Diagnosis not present

## 2018-07-23 DIAGNOSIS — L97211 Non-pressure chronic ulcer of right calf limited to breakdown of skin: Secondary | ICD-10-CM | POA: Diagnosis not present

## 2018-07-23 DIAGNOSIS — E11622 Type 2 diabetes mellitus with other skin ulcer: Secondary | ICD-10-CM | POA: Diagnosis not present

## 2018-07-23 DIAGNOSIS — I130 Hypertensive heart and chronic kidney disease with heart failure and stage 1 through stage 4 chronic kidney disease, or unspecified chronic kidney disease: Secondary | ICD-10-CM | POA: Diagnosis not present

## 2018-07-23 DIAGNOSIS — L97422 Non-pressure chronic ulcer of left heel and midfoot with fat layer exposed: Secondary | ICD-10-CM | POA: Diagnosis not present

## 2018-07-23 DIAGNOSIS — L97221 Non-pressure chronic ulcer of left calf limited to breakdown of skin: Secondary | ICD-10-CM | POA: Diagnosis not present

## 2018-07-23 DIAGNOSIS — E11621 Type 2 diabetes mellitus with foot ulcer: Secondary | ICD-10-CM | POA: Diagnosis not present

## 2018-07-25 DIAGNOSIS — E11621 Type 2 diabetes mellitus with foot ulcer: Secondary | ICD-10-CM | POA: Diagnosis not present

## 2018-07-25 DIAGNOSIS — L97422 Non-pressure chronic ulcer of left heel and midfoot with fat layer exposed: Secondary | ICD-10-CM | POA: Diagnosis not present

## 2018-07-25 DIAGNOSIS — I130 Hypertensive heart and chronic kidney disease with heart failure and stage 1 through stage 4 chronic kidney disease, or unspecified chronic kidney disease: Secondary | ICD-10-CM | POA: Diagnosis not present

## 2018-07-25 DIAGNOSIS — L97211 Non-pressure chronic ulcer of right calf limited to breakdown of skin: Secondary | ICD-10-CM | POA: Diagnosis not present

## 2018-07-25 DIAGNOSIS — L97221 Non-pressure chronic ulcer of left calf limited to breakdown of skin: Secondary | ICD-10-CM | POA: Diagnosis not present

## 2018-07-25 DIAGNOSIS — E11622 Type 2 diabetes mellitus with other skin ulcer: Secondary | ICD-10-CM | POA: Diagnosis not present

## 2018-07-29 DIAGNOSIS — L97221 Non-pressure chronic ulcer of left calf limited to breakdown of skin: Secondary | ICD-10-CM | POA: Diagnosis not present

## 2018-07-29 DIAGNOSIS — I130 Hypertensive heart and chronic kidney disease with heart failure and stage 1 through stage 4 chronic kidney disease, or unspecified chronic kidney disease: Secondary | ICD-10-CM | POA: Diagnosis not present

## 2018-07-29 DIAGNOSIS — L97211 Non-pressure chronic ulcer of right calf limited to breakdown of skin: Secondary | ICD-10-CM | POA: Diagnosis not present

## 2018-07-29 DIAGNOSIS — L97422 Non-pressure chronic ulcer of left heel and midfoot with fat layer exposed: Secondary | ICD-10-CM | POA: Diagnosis not present

## 2018-07-29 DIAGNOSIS — E11622 Type 2 diabetes mellitus with other skin ulcer: Secondary | ICD-10-CM | POA: Diagnosis not present

## 2018-07-29 DIAGNOSIS — E11621 Type 2 diabetes mellitus with foot ulcer: Secondary | ICD-10-CM | POA: Diagnosis not present

## 2018-07-30 DIAGNOSIS — L97221 Non-pressure chronic ulcer of left calf limited to breakdown of skin: Secondary | ICD-10-CM | POA: Diagnosis not present

## 2018-07-30 DIAGNOSIS — I130 Hypertensive heart and chronic kidney disease with heart failure and stage 1 through stage 4 chronic kidney disease, or unspecified chronic kidney disease: Secondary | ICD-10-CM | POA: Diagnosis not present

## 2018-07-30 DIAGNOSIS — L97211 Non-pressure chronic ulcer of right calf limited to breakdown of skin: Secondary | ICD-10-CM | POA: Diagnosis not present

## 2018-07-30 DIAGNOSIS — E11622 Type 2 diabetes mellitus with other skin ulcer: Secondary | ICD-10-CM | POA: Diagnosis not present

## 2018-07-30 DIAGNOSIS — L97422 Non-pressure chronic ulcer of left heel and midfoot with fat layer exposed: Secondary | ICD-10-CM | POA: Diagnosis not present

## 2018-07-30 DIAGNOSIS — E11621 Type 2 diabetes mellitus with foot ulcer: Secondary | ICD-10-CM | POA: Diagnosis not present

## 2018-07-31 DIAGNOSIS — L97221 Non-pressure chronic ulcer of left calf limited to breakdown of skin: Secondary | ICD-10-CM | POA: Diagnosis not present

## 2018-07-31 DIAGNOSIS — I130 Hypertensive heart and chronic kidney disease with heart failure and stage 1 through stage 4 chronic kidney disease, or unspecified chronic kidney disease: Secondary | ICD-10-CM | POA: Diagnosis not present

## 2018-07-31 DIAGNOSIS — L97422 Non-pressure chronic ulcer of left heel and midfoot with fat layer exposed: Secondary | ICD-10-CM | POA: Diagnosis not present

## 2018-07-31 DIAGNOSIS — E11622 Type 2 diabetes mellitus with other skin ulcer: Secondary | ICD-10-CM | POA: Diagnosis not present

## 2018-07-31 DIAGNOSIS — L97211 Non-pressure chronic ulcer of right calf limited to breakdown of skin: Secondary | ICD-10-CM | POA: Diagnosis not present

## 2018-07-31 DIAGNOSIS — E11621 Type 2 diabetes mellitus with foot ulcer: Secondary | ICD-10-CM | POA: Diagnosis not present

## 2018-08-01 DIAGNOSIS — L97422 Non-pressure chronic ulcer of left heel and midfoot with fat layer exposed: Secondary | ICD-10-CM | POA: Diagnosis not present

## 2018-08-01 DIAGNOSIS — I130 Hypertensive heart and chronic kidney disease with heart failure and stage 1 through stage 4 chronic kidney disease, or unspecified chronic kidney disease: Secondary | ICD-10-CM | POA: Diagnosis not present

## 2018-08-01 DIAGNOSIS — L97211 Non-pressure chronic ulcer of right calf limited to breakdown of skin: Secondary | ICD-10-CM | POA: Diagnosis not present

## 2018-08-01 DIAGNOSIS — E11621 Type 2 diabetes mellitus with foot ulcer: Secondary | ICD-10-CM | POA: Diagnosis not present

## 2018-08-01 DIAGNOSIS — L97221 Non-pressure chronic ulcer of left calf limited to breakdown of skin: Secondary | ICD-10-CM | POA: Diagnosis not present

## 2018-08-01 DIAGNOSIS — E11622 Type 2 diabetes mellitus with other skin ulcer: Secondary | ICD-10-CM | POA: Diagnosis not present

## 2018-08-06 DIAGNOSIS — I130 Hypertensive heart and chronic kidney disease with heart failure and stage 1 through stage 4 chronic kidney disease, or unspecified chronic kidney disease: Secondary | ICD-10-CM | POA: Diagnosis not present

## 2018-08-06 DIAGNOSIS — E11622 Type 2 diabetes mellitus with other skin ulcer: Secondary | ICD-10-CM | POA: Diagnosis not present

## 2018-08-06 DIAGNOSIS — L97221 Non-pressure chronic ulcer of left calf limited to breakdown of skin: Secondary | ICD-10-CM | POA: Diagnosis not present

## 2018-08-06 DIAGNOSIS — L97422 Non-pressure chronic ulcer of left heel and midfoot with fat layer exposed: Secondary | ICD-10-CM | POA: Diagnosis not present

## 2018-08-06 DIAGNOSIS — E11621 Type 2 diabetes mellitus with foot ulcer: Secondary | ICD-10-CM | POA: Diagnosis not present

## 2018-08-06 DIAGNOSIS — L97211 Non-pressure chronic ulcer of right calf limited to breakdown of skin: Secondary | ICD-10-CM | POA: Diagnosis not present

## 2018-08-07 DIAGNOSIS — L97422 Non-pressure chronic ulcer of left heel and midfoot with fat layer exposed: Secondary | ICD-10-CM | POA: Diagnosis not present

## 2018-08-07 DIAGNOSIS — I130 Hypertensive heart and chronic kidney disease with heart failure and stage 1 through stage 4 chronic kidney disease, or unspecified chronic kidney disease: Secondary | ICD-10-CM | POA: Diagnosis not present

## 2018-08-07 DIAGNOSIS — E11622 Type 2 diabetes mellitus with other skin ulcer: Secondary | ICD-10-CM | POA: Diagnosis not present

## 2018-08-07 DIAGNOSIS — L97221 Non-pressure chronic ulcer of left calf limited to breakdown of skin: Secondary | ICD-10-CM | POA: Diagnosis not present

## 2018-08-07 DIAGNOSIS — L97211 Non-pressure chronic ulcer of right calf limited to breakdown of skin: Secondary | ICD-10-CM | POA: Diagnosis not present

## 2018-08-07 DIAGNOSIS — E11621 Type 2 diabetes mellitus with foot ulcer: Secondary | ICD-10-CM | POA: Diagnosis not present

## 2018-08-08 DIAGNOSIS — I130 Hypertensive heart and chronic kidney disease with heart failure and stage 1 through stage 4 chronic kidney disease, or unspecified chronic kidney disease: Secondary | ICD-10-CM | POA: Diagnosis not present

## 2018-08-08 DIAGNOSIS — L97422 Non-pressure chronic ulcer of left heel and midfoot with fat layer exposed: Secondary | ICD-10-CM | POA: Diagnosis not present

## 2018-08-08 DIAGNOSIS — L97211 Non-pressure chronic ulcer of right calf limited to breakdown of skin: Secondary | ICD-10-CM | POA: Diagnosis not present

## 2018-08-08 DIAGNOSIS — L97221 Non-pressure chronic ulcer of left calf limited to breakdown of skin: Secondary | ICD-10-CM | POA: Diagnosis not present

## 2018-08-08 DIAGNOSIS — E11622 Type 2 diabetes mellitus with other skin ulcer: Secondary | ICD-10-CM | POA: Diagnosis not present

## 2018-08-08 DIAGNOSIS — E11621 Type 2 diabetes mellitus with foot ulcer: Secondary | ICD-10-CM | POA: Diagnosis not present

## 2018-08-09 ENCOUNTER — Other Ambulatory Visit: Payer: Self-pay

## 2018-08-09 ENCOUNTER — Emergency Department (HOSPITAL_COMMUNITY)
Admission: EM | Admit: 2018-08-09 | Discharge: 2018-08-10 | Disposition: A | Discharge status: Home / Self Care | Payer: Medicare Other | Attending: Emergency Medicine | Admitting: Emergency Medicine

## 2018-08-09 ENCOUNTER — Emergency Department (HOSPITAL_BASED_OUTPATIENT_CLINIC_OR_DEPARTMENT_OTHER): Payer: Medicare Other

## 2018-08-09 DIAGNOSIS — L0390 Cellulitis, unspecified: Secondary | ICD-10-CM

## 2018-08-09 DIAGNOSIS — L03116 Cellulitis of left lower limb: Secondary | ICD-10-CM | POA: Diagnosis not present

## 2018-08-09 DIAGNOSIS — L97211 Non-pressure chronic ulcer of right calf limited to breakdown of skin: Secondary | ICD-10-CM | POA: Diagnosis not present

## 2018-08-09 DIAGNOSIS — M79609 Pain in unspecified limb: Secondary | ICD-10-CM

## 2018-08-09 DIAGNOSIS — R7881 Bacteremia: Secondary | ICD-10-CM | POA: Insufficient documentation

## 2018-08-09 DIAGNOSIS — Z8546 Personal history of malignant neoplasm of prostate: Secondary | ICD-10-CM | POA: Insufficient documentation

## 2018-08-09 DIAGNOSIS — I10 Essential (primary) hypertension: Secondary | ICD-10-CM | POA: Diagnosis not present

## 2018-08-09 DIAGNOSIS — N189 Chronic kidney disease, unspecified: Secondary | ICD-10-CM | POA: Insufficient documentation

## 2018-08-09 DIAGNOSIS — R21 Rash and other nonspecific skin eruption: Secondary | ICD-10-CM | POA: Diagnosis not present

## 2018-08-09 DIAGNOSIS — M79604 Pain in right leg: Secondary | ICD-10-CM | POA: Diagnosis present

## 2018-08-09 DIAGNOSIS — L97221 Non-pressure chronic ulcer of left calf limited to breakdown of skin: Secondary | ICD-10-CM | POA: Diagnosis not present

## 2018-08-09 DIAGNOSIS — I130 Hypertensive heart and chronic kidney disease with heart failure and stage 1 through stage 4 chronic kidney disease, or unspecified chronic kidney disease: Secondary | ICD-10-CM | POA: Diagnosis not present

## 2018-08-09 DIAGNOSIS — I509 Heart failure, unspecified: Secondary | ICD-10-CM | POA: Insufficient documentation

## 2018-08-09 DIAGNOSIS — L97422 Non-pressure chronic ulcer of left heel and midfoot with fat layer exposed: Secondary | ICD-10-CM | POA: Diagnosis not present

## 2018-08-09 DIAGNOSIS — E11622 Type 2 diabetes mellitus with other skin ulcer: Secondary | ICD-10-CM | POA: Diagnosis not present

## 2018-08-09 DIAGNOSIS — E11621 Type 2 diabetes mellitus with foot ulcer: Secondary | ICD-10-CM | POA: Diagnosis not present

## 2018-08-09 DIAGNOSIS — L03114 Cellulitis of left upper limb: Secondary | ICD-10-CM | POA: Diagnosis not present

## 2018-08-09 LAB — CBC WITH DIFFERENTIAL/PLATELET
BASOS PCT: 0 %
Basophils Absolute: 0 10*3/uL (ref 0.0–0.1)
EOS PCT: 4 %
Eosinophils Absolute: 0.2 10*3/uL (ref 0.0–0.7)
HEMATOCRIT: 32.8 % — AB (ref 39.0–52.0)
HEMOGLOBIN: 10.6 g/dL — AB (ref 13.0–17.0)
Lymphocytes Relative: 12 %
Lymphs Abs: 0.7 10*3/uL (ref 0.7–4.0)
MCH: 29.5 pg (ref 26.0–34.0)
MCHC: 32.3 g/dL (ref 30.0–36.0)
MCV: 91.4 fL (ref 78.0–100.0)
MONO ABS: 0.4 10*3/uL (ref 0.1–1.0)
MONOS PCT: 8 %
NEUTROS ABS: 4 10*3/uL (ref 1.7–7.7)
Neutrophils Relative %: 76 %
Platelets: 245 10*3/uL (ref 150–400)
RBC: 3.59 MIL/uL — ABNORMAL LOW (ref 4.22–5.81)
RDW: 14.3 % (ref 11.5–15.5)
WBC: 5.3 10*3/uL (ref 4.0–10.5)

## 2018-08-09 LAB — COMPREHENSIVE METABOLIC PANEL
ALBUMIN: 3 g/dL — AB (ref 3.5–5.0)
ALK PHOS: 95 U/L (ref 38–126)
ALT: 11 U/L (ref 0–44)
ANION GAP: 9 (ref 5–15)
AST: 14 U/L — AB (ref 15–41)
BILIRUBIN TOTAL: 0.5 mg/dL (ref 0.3–1.2)
BUN: 48 mg/dL — AB (ref 8–23)
CALCIUM: 8.6 mg/dL — AB (ref 8.9–10.3)
CO2: 22 mmol/L (ref 22–32)
Chloride: 109 mmol/L (ref 98–111)
Creatinine, Ser: 1.79 mg/dL — ABNORMAL HIGH (ref 0.61–1.24)
GFR calc Af Amer: 41 mL/min — ABNORMAL LOW (ref >60)
GFR calc non Af Amer: 35 mL/min — ABNORMAL LOW (ref >60)
GLUCOSE: 140 mg/dL — AB (ref 70–99)
Potassium: 4.5 mmol/L (ref 3.5–5.1)
SODIUM: 140 mmol/L (ref 135–145)
TOTAL PROTEIN: 7.1 g/dL (ref 6.5–8.1)

## 2018-08-09 LAB — I-STAT CG4 LACTIC ACID, ED: Lactic Acid, Venous: 1.12 mmol/L (ref 0.5–1.9)

## 2018-08-09 MED ORDER — NYSTATIN 100000 UNIT/GM EX POWD: Freq: Four times a day (QID) | CUTANEOUS | 2 refills | Status: AC

## 2018-08-09 MED ORDER — LACTATED RINGERS IV BOLUS
1000.0000 mL | Freq: Once | INTRAVENOUS | Status: AC
  Administered 2018-08-09: 1000 mL via INTRAVENOUS

## 2018-08-09 MED ORDER — NYSTATIN 100000 UNIT/GM EX POWD: Freq: Four times a day (QID) | CUTANEOUS | 2 refills | Status: DC

## 2018-08-09 MED ORDER — CLINDAMYCIN HCL 300 MG PO CAPS: 300.0000 mg | ORAL_CAPSULE | Freq: Four times a day (QID) | ORAL | 0 refills | Status: DC

## 2018-08-09 MED ORDER — CLINDAMYCIN PHOSPHATE 600 MG/50ML IV SOLN
600.0000 mg | Freq: Once | INTRAVENOUS | Status: AC
  Administered 2018-08-09: 600 mg via INTRAVENOUS
  Filled 2018-08-09: qty 50

## 2018-08-09 NOTE — ED Provider Notes (Signed)
Emergency Department Provider Note   I have reviewed the triage vital signs and the nursing notes.   HISTORY  Chief Complaint Leg Pain and Rash   HPI [name] is a 77 y.o. male with multiple medical problems are presents to the emergency department today secondary to leg pain and groin pain.  Patient states that he has been having worsening right-sided leg pain recently but the nerve see note states that it actually his left.  Has multiple wounds on both sides apparently being taken care of by wound care.  He also states he has groin pain for last 2 days. No other associated or modifying symptoms.    Past Medical History:  Diagnosis Date  . CAP (community acquired pneumonia)   . CHF (congestive heart failure) ([place])   . Diabetes ([place])   . Hyperlipidemia   . Hypertension   . Hypotension   . Pleural effusion on right   . Prostate cancer ([place])   . Ulcers of both lower extremities ([place])   . Venous insufficiency (chronic) (peripheral)     Patient Active Problem List   Diagnosis Date Noted  . Anemia 02/19/2018  . Dermatitis associated with moisture from urinary incontinence 02/19/2018  . Acute encephalopathy 02/18/2018  . Acute kidney injury superimposed on CKD ([place]) 02/18/2018  . Dehydration 02/18/2018  . Leukocytosis 02/18/2018  . Cellulitis of leg, right 02/18/2018  . Cellulitis of flank 02/18/2018  . Cellulitis of perineum 02/18/2018  . Cellulitis of buttock   . ARF (acute renal failure) ([place])   . CAP (community acquired pneumonia) 12/06/2017  . Dyspnea 12/06/2017  . Acute heart failure ([place]) 12/06/2017  . Pleural effusion on right 12/06/2017  . Diabetes ([place])   . WOUND, LEG 01/01/2011  . [place] SHOULDER REGION 11/23/2010  . Decubitus ulcer of heel 11/16/2010  . ADENOCARCINOMA, PROSTATE 09/13/2010  . Type 2 diabetes mellitus with hyperlipidemia ([place]) 09/13/2010  . Hyperlipidemia 09/13/2010  . OBESITY, MORBID 09/13/2010  .  ANXIETY DEPRESSION 09/13/2010  . HYPERTENSION, BENIGN ESSENTIAL 09/13/2010  . VENOUS STASIS ULCER 09/13/2010  . Venous (peripheral) insufficiency 09/13/2010  . DEGENERATIVE JOINT DISEASE, HIPS 09/13/2010  . Backache 09/13/2010  . GAIT DISTURBANCE 09/13/2010  . INCONTINENCE, URGE 09/13/2010  . HIP REPLACEMENT, BILATERAL, HX OF 09/13/2010    No past surgical history on file.  Current Outpatient Rx  . Order #: [id number] Class: Historical Med  . Order #: [id number] Class: Historical Med  . Order #: [id number] Class: Historical Med  . Order #: [id number] Class: Print  . Order #: [id number] Class: No Print  . Order #: [id number] Class: Historical Med  . Order #: [id number] Class: Historical Med  . Order #: [id number] Class: Historical Med  . Order #: [id number] Class: Historical Med  . Order #: [id number] Class: Historical Med  . Order #: [id number] Class: Print  . Order #: [id number] Class: Print    Allergies Penicillins and Sulfa antibiotics  Family History  Problem Relation Age of Onset  . Coronary artery disease Father   . Coronary artery disease Mother   . Dementia Mother   . Coronary artery disease Sister     Social History Social History   Tobacco Use  . Smoking status: Never Smoker  . Smokeless tobacco: Never Used  Substance Use Topics  . Alcohol use: Yes    Alcohol/week: 1.0 standard drinks    Types: 1 Cans of beer per week    Frequency: Never  . Drug use: No    Review of Systems  All other systems negative  except as documented in the HPI. All pertinent positives and negatives as reviewed in the HPI. ____________________________________________   PHYSICAL EXAM:  VITAL SIGNS: ED Triage Vitals  Enc Vitals Group     BP 08/09/18 1703 (!) 171/88     Pulse Rate 08/09/18 1703 80     Resp 08/09/18 1703 18     Temp 08/09/18 1703 97.7 F (36.5 C)     Temp Source 08/09/18 1703 Oral     SpO2 08/09/18 1703 100 %     Weight --      Height --      Head Circumference --      Peak Flow --       Pain Score 08/09/18 1656 8     Pain Loc --      Pain Edu? --      Excl. in [place]? --     Constitutional: Alert and oriented. Well appearing and in no acute distress. Eyes: Conjunctivae are normal. PERRL. EOMI. Head: Atraumatic. Nose: No congestion/rhinnorhea. Mouth/Throat: Mucous membranes are moist.  Oropharynx non-erythematous. Neck: No stridor.  No meningeal signs.   Cardiovascular: Normal rate, regular rhythm. Good peripheral circulation. Grossly normal heart sounds.   Respiratory: Normal respiratory effort.  No retractions. Lungs CTAB. Gastrointestinal: Soft and nontender. No distention.  Musculoskeletal: No lower extremity tenderness nor edema. No gross deformities of extremities. Neurologic:  Normal speech and language. No gross focal neurologic deficits are appreciated.  Skin:  Left leg is initially erythematous below the knee but has induration, erythema and warmth going up the inner thigh towards his groin where there is significant amount of denuded skin and foul-smelling drainage.  Is not significantly tender in those areas.  Right leg is wrapped in [place] boot and patient states that he has some tenderness just proximal to his knee but no erythema, warmth or induration there.   ____________________________________________   LABS (all labs ordered are listed, but only abnormal results are displayed)  Labs Reviewed  COMPREHENSIVE METABOLIC PANEL - Abnormal; Notable for the following components:      Result Value   Glucose, Bld 140 (*)    BUN 48 (*)    Creatinine, Ser 1.79 (*)    Calcium 8.6 (*)    Albumin 3.0 (*)    AST 14 (*)    GFR calc non Af Amer 35 (*)    GFR calc Af Amer 41 (*)    All other components within normal limits  CBC WITH DIFFERENTIAL/PLATELET - Abnormal; Notable for the following components:   RBC 3.59 (*)    Hemoglobin 10.6 (*)    HCT 32.8 (*)    All other components within normal limits  CULTURE, BLOOD (ROUTINE X 2)  CULTURE, BLOOD (ROUTINE X  2)  URINALYSIS, ROUTINE W REFLEX MICROSCOPIC  I-STAT CG4 LACTIC ACID, ED   _____________________________________________   INITIAL IMPRESSION / ASSESSMENT AND PLAN / ED COURSE  Will eval for dvt but suspect he has left leg cellulitis that is traveling up his leg. Will give clinda. Check labs. dispo pending, VS WNL.   Patient persistently stable in the emergency room.  Vital signs within normal limits.  Lactic acid normal.  No white blood cell count.  No evidence of severe infection.  Will start on clindamycin and nystatin for his groin.  Follow-up with PCP.   Pertinent labs & imaging results that were available during my care of the patient were reviewed by me and considered in my medical decision making (see chart  for details).  ____________________________________________  FINAL CLINICAL IMPRESSION(S) / ED DIAGNOSES  Final diagnoses:  Cellulitis, unspecified cellulitis site     MEDICATIONS GIVEN DURING THIS VISIT:  Medications  lactated ringers bolus 1,000 mL (0 mLs Intravenous Stopped 08/09/18 2024)  clindamycin (CLEOCIN) IVPB 600 mg (0 mg Intravenous Stopped 08/09/18 1914)     NEW OUTPATIENT MEDICATIONS STARTED DURING THIS VISIT:  New Prescriptions   CLINDAMYCIN (CLEOCIN) 300 MG CAPSULE    Take 1 capsule (300 mg total) by mouth 4 (four) times daily. X 7 days   NYSTATIN (MYCOSTATIN/NYSTOP) POWDER    Apply topically 4 (four) times daily. To groin.    Note:  This note was prepared with assistance of Dragon voice recognition software. Occasional wrong-word or sound-a-like substitutions may have occurred due to the inherent limitations of voice recognition software.   Merrily Pew, MD 08/09/18 2352

## 2018-08-09 NOTE — ED Triage Notes (Signed)
Patient arrives via EMS. Patients nurse was changing wound to left leg and patient had pain. Hx DVT. Patient currently c/o groin pain, area is sore and tender to area. Left leg is red and swollen. Patient denies CP/SOB  BP: 138/70 HR: 71 RR: 20 SPO02: 98%

## 2018-08-09 NOTE — ED Notes (Signed)
[facility] called for transport.  

## 2018-08-09 NOTE — Progress Notes (Signed)
*  Preliminary Results* Bilateral lower extremity venous duplex completed. No obvious evidence of deep vein thrombosis involving visualized veins of bilateral lower extremities.  08/09/2018 7:40 PM Maudry Mayhew, MHA, RVT, RDCS, RDMS

## 2018-08-09 NOTE — ED Notes (Signed)
Bed: WA17 Expected date:  Expected time:  Means of arrival:  Comments: 77 yo leg pain

## 2018-08-09 NOTE — ED Notes (Signed)
Pt. Made aware for the need of urine specimen. 

## 2018-08-10 DIAGNOSIS — Z7401 Bed confinement status: Secondary | ICD-10-CM | POA: Diagnosis not present

## 2018-08-10 DIAGNOSIS — M2550 Pain in unspecified joint: Secondary | ICD-10-CM | POA: Diagnosis not present

## 2018-08-10 DIAGNOSIS — R531 Weakness: Secondary | ICD-10-CM | POA: Diagnosis not present

## 2018-08-10 NOTE — ED Notes (Signed)
[facility] at bedside 

## 2018-08-13 ENCOUNTER — Encounter (HOSPITAL_COMMUNITY): Payer: Self-pay

## 2018-08-13 ENCOUNTER — Other Ambulatory Visit: Payer: Self-pay

## 2018-08-13 ENCOUNTER — Inpatient Hospital Stay (HOSPITAL_COMMUNITY)
Admission: EM | Admit: 2018-08-13 | Discharge: 2018-08-20 | DRG: 602 | Disposition: A | Discharge status: Skilled Nursing Facility | Payer: Medicare Other | Attending: Internal Medicine | Admitting: Internal Medicine

## 2018-08-13 ENCOUNTER — Telehealth (HOSPITAL_BASED_OUTPATIENT_CLINIC_OR_DEPARTMENT_OTHER): Payer: Self-pay | Admitting: *Deleted

## 2018-08-13 DIAGNOSIS — N183 Chronic kidney disease, stage 3 unspecified: Secondary | ICD-10-CM

## 2018-08-13 DIAGNOSIS — H109 Unspecified conjunctivitis: Secondary | ICD-10-CM | POA: Diagnosis present

## 2018-08-13 DIAGNOSIS — E1169 Type 2 diabetes mellitus with other specified complication: Secondary | ICD-10-CM | POA: Diagnosis not present

## 2018-08-13 DIAGNOSIS — B372 Candidiasis of skin and nail: Secondary | ICD-10-CM | POA: Diagnosis present

## 2018-08-13 DIAGNOSIS — I130 Hypertensive heart and chronic kidney disease with heart failure and stage 1 through stage 4 chronic kidney disease, or unspecified chronic kidney disease: Secondary | ICD-10-CM | POA: Diagnosis not present

## 2018-08-13 DIAGNOSIS — L97828 Non-pressure chronic ulcer of other part of left lower leg with other specified severity: Secondary | ICD-10-CM | POA: Diagnosis not present

## 2018-08-13 DIAGNOSIS — I83009 Varicose veins of unspecified lower extremity with ulcer of unspecified site: Secondary | ICD-10-CM

## 2018-08-13 DIAGNOSIS — E785 Hyperlipidemia, unspecified: Secondary | ICD-10-CM | POA: Diagnosis not present

## 2018-08-13 DIAGNOSIS — L0390 Cellulitis, unspecified: Secondary | ICD-10-CM | POA: Diagnosis not present

## 2018-08-13 DIAGNOSIS — N5089 Other specified disorders of the male genital organs: Secondary | ICD-10-CM

## 2018-08-13 DIAGNOSIS — C61 Malignant neoplasm of prostate: Secondary | ICD-10-CM | POA: Diagnosis present

## 2018-08-13 DIAGNOSIS — I83028 Varicose veins of left lower extremity with ulcer other part of lower leg: Secondary | ICD-10-CM | POA: Diagnosis present

## 2018-08-13 DIAGNOSIS — E1140 Type 2 diabetes mellitus with diabetic neuropathy, unspecified: Secondary | ICD-10-CM | POA: Diagnosis present

## 2018-08-13 DIAGNOSIS — L89623 Pressure ulcer of left heel, stage 3: Secondary | ICD-10-CM | POA: Diagnosis present

## 2018-08-13 DIAGNOSIS — R05 Cough: Secondary | ICD-10-CM

## 2018-08-13 DIAGNOSIS — L97818 Non-pressure chronic ulcer of other part of right lower leg with other specified severity: Secondary | ICD-10-CM | POA: Diagnosis present

## 2018-08-13 DIAGNOSIS — Z7982 Long term (current) use of aspirin: Secondary | ICD-10-CM

## 2018-08-13 DIAGNOSIS — Z96643 Presence of artificial hip joint, bilateral: Secondary | ICD-10-CM | POA: Diagnosis present

## 2018-08-13 DIAGNOSIS — I83018 Varicose veins of right lower extremity with ulcer other part of lower leg: Secondary | ICD-10-CM | POA: Diagnosis present

## 2018-08-13 DIAGNOSIS — L03115 Cellulitis of right lower limb: Secondary | ICD-10-CM | POA: Diagnosis present

## 2018-08-13 DIAGNOSIS — J029 Acute pharyngitis, unspecified: Secondary | ICD-10-CM | POA: Diagnosis present

## 2018-08-13 DIAGNOSIS — L03116 Cellulitis of left lower limb: Principal | ICD-10-CM

## 2018-08-13 DIAGNOSIS — E1122 Type 2 diabetes mellitus with diabetic chronic kidney disease: Secondary | ICD-10-CM | POA: Diagnosis present

## 2018-08-13 DIAGNOSIS — Z6835 Body mass index (BMI) 35.0-35.9, adult: Secondary | ICD-10-CM

## 2018-08-13 DIAGNOSIS — L97909 Non-pressure chronic ulcer of unspecified part of unspecified lower leg with unspecified severity: Secondary | ICD-10-CM | POA: Diagnosis present

## 2018-08-13 DIAGNOSIS — R059 Cough, unspecified: Secondary | ICD-10-CM

## 2018-08-13 DIAGNOSIS — N179 Acute kidney failure, unspecified: Secondary | ICD-10-CM | POA: Diagnosis present

## 2018-08-13 DIAGNOSIS — I5032 Chronic diastolic (congestive) heart failure: Secondary | ICD-10-CM | POA: Diagnosis not present

## 2018-08-13 DIAGNOSIS — N189 Chronic kidney disease, unspecified: Secondary | ICD-10-CM | POA: Diagnosis present

## 2018-08-13 DIAGNOSIS — I872 Venous insufficiency (chronic) (peripheral): Secondary | ICD-10-CM | POA: Diagnosis present

## 2018-08-13 DIAGNOSIS — I878 Other specified disorders of veins: Secondary | ICD-10-CM | POA: Diagnosis present

## 2018-08-13 DIAGNOSIS — R001 Bradycardia, unspecified: Secondary | ICD-10-CM | POA: Diagnosis not present

## 2018-08-13 DIAGNOSIS — R197 Diarrhea, unspecified: Secondary | ICD-10-CM | POA: Diagnosis present

## 2018-08-13 LAB — CBC WITH DIFFERENTIAL/PLATELET
Basophils Absolute: 0.1 10*3/uL (ref 0.0–0.1)
Basophils Relative: 1 %
EOS ABS: 0.1 10*3/uL (ref 0.0–0.7)
EOS PCT: 3 %
HEMATOCRIT: 32.3 % — AB (ref 39.0–52.0)
HEMOGLOBIN: 10.6 g/dL — AB (ref 13.0–17.0)
LYMPHS ABS: 0.6 10*3/uL — AB (ref 0.7–4.0)
Lymphocytes Relative: 12 %
MCH: 29.9 pg (ref 26.0–34.0)
MCHC: 32.8 g/dL (ref 30.0–36.0)
MCV: 91 fL (ref 78.0–100.0)
Monocytes Absolute: 0.4 10*3/uL (ref 0.1–1.0)
Monocytes Relative: 7 %
NEUTROS PCT: 77 %
Neutro Abs: 3.8 10*3/uL (ref 1.7–7.7)
Platelets: 246 10*3/uL (ref 150–400)
RBC: 3.55 MIL/uL — AB (ref 4.22–5.81)
RDW: 14.4 % (ref 11.5–15.5)
WBC: 4.9 10*3/uL (ref 4.0–10.5)

## 2018-08-13 LAB — GLUCOSE, CAPILLARY: Glucose-Capillary: 150 mg/dL — ABNORMAL HIGH (ref 70–99)

## 2018-08-13 LAB — BLOOD CULTURE ID PANEL (REFLEXED)
ACINETOBACTER BAUMANNII: NOT DETECTED
CANDIDA ALBICANS: NOT DETECTED
CANDIDA GLABRATA: NOT DETECTED
Candida krusei: NOT DETECTED
Candida parapsilosis: NOT DETECTED
Candida tropicalis: NOT DETECTED
ENTEROBACTER CLOACAE COMPLEX: NOT DETECTED
Enterobacteriaceae species: NOT DETECTED
Enterococcus species: NOT DETECTED
Escherichia coli: NOT DETECTED
Haemophilus influenzae: NOT DETECTED
Klebsiella oxytoca: NOT DETECTED
Klebsiella pneumoniae: NOT DETECTED
Listeria monocytogenes: NOT DETECTED
Neisseria meningitidis: NOT DETECTED
PROTEUS SPECIES: NOT DETECTED
PSEUDOMONAS AERUGINOSA: NOT DETECTED
STAPHYLOCOCCUS SPECIES: NOT DETECTED
STREPTOCOCCUS AGALACTIAE: NOT DETECTED
STREPTOCOCCUS PNEUMONIAE: NOT DETECTED
Serratia marcescens: NOT DETECTED
Staphylococcus aureus (BCID): NOT DETECTED
Streptococcus pyogenes: NOT DETECTED
Streptococcus species: NOT DETECTED

## 2018-08-13 LAB — BASIC METABOLIC PANEL
Anion gap: 12 (ref 5–15)
BUN: 45 mg/dL — ABNORMAL HIGH (ref 8–23)
CO2: 24 mmol/L (ref 22–32)
CREATININE: 1.89 mg/dL — AB (ref 0.61–1.24)
Calcium: 9 mg/dL (ref 8.9–10.3)
Chloride: 108 mmol/L (ref 98–111)
GFR calc Af Amer: 38 mL/min — ABNORMAL LOW (ref >60)
GFR calc non Af Amer: 33 mL/min — ABNORMAL LOW (ref >60)
GLUCOSE: 163 mg/dL — AB (ref 70–99)
POTASSIUM: 4.2 mmol/L (ref 3.5–5.1)
Sodium: 144 mmol/L (ref 135–145)

## 2018-08-13 MED ORDER — NEOMYCIN-POLYMYXIN-DEXAMETH 3.5-10000-0.1 OP SUSP
1.0000 [drp] | Freq: Four times a day (QID) | OPHTHALMIC | Status: DC
  Administered 2018-08-13 – 2018-08-20 (×26): 1 [drp] via OPHTHALMIC
  Filled 2018-08-13: qty 5

## 2018-08-13 MED ORDER — BUPROPION HCL ER (XL) 300 MG PO TB24
300.0000 mg | ORAL_TABLET | Freq: Every day | ORAL | Status: DC
  Administered 2018-08-14 – 2018-08-20 (×7): 300 mg via ORAL
  Filled 2018-08-13: qty 2
  Filled 2018-08-13: qty 1
  Filled 2018-08-13 (×4): qty 2
  Filled 2018-08-13: qty 1

## 2018-08-13 MED ORDER — VANCOMYCIN HCL IN DEXTROSE 1-5 GM/200ML-% IV SOLN: 1000.0000 mg | Freq: Once | INTRAVENOUS | Status: DC

## 2018-08-13 MED ORDER — ATORVASTATIN CALCIUM 40 MG PO TABS
80.0000 mg | ORAL_TABLET | Freq: Every day | ORAL | Status: DC
  Administered 2018-08-13 – 2018-08-20 (×8): 80 mg via ORAL
  Filled 2018-08-13 (×4): qty 2
  Filled 2018-08-13 (×4): qty 1
  Filled 2018-08-13 (×2): qty 2
  Filled 2018-08-13 (×2): qty 1
  Filled 2018-08-13: qty 2

## 2018-08-13 MED ORDER — ENOXAPARIN SODIUM 60 MG/0.6ML ~~LOC~~ SOLN
60.0000 mg | SUBCUTANEOUS | Status: DC
  Administered 2018-08-13 – 2018-08-15 (×3): 60 mg via SUBCUTANEOUS
  Filled 2018-08-13 (×3): qty 0.6

## 2018-08-13 MED ORDER — ACETAMINOPHEN 650 MG RE SUPP: 650.0000 mg | Freq: Four times a day (QID) | RECTAL | Status: DC | PRN

## 2018-08-13 MED ORDER — ESCITALOPRAM OXALATE 10 MG PO TABS
10.0000 mg | ORAL_TABLET | Freq: Every day | ORAL | Status: DC
  Administered 2018-08-14 – 2018-08-20 (×7): 10 mg via ORAL
  Filled 2018-08-13 (×7): qty 1

## 2018-08-13 MED ORDER — VANCOMYCIN HCL 10 G IV SOLR
1750.0000 mg | INTRAVENOUS | Status: DC
  Administered 2018-08-15: 1750 mg via INTRAVENOUS
  Filled 2018-08-13: qty 1750

## 2018-08-13 MED ORDER — LOSARTAN POTASSIUM 50 MG PO TABS
50.0000 mg | ORAL_TABLET | Freq: Every day | ORAL | Status: DC
  Administered 2018-08-14 – 2018-08-15 (×2): 50 mg via ORAL
  Filled 2018-08-13 (×2): qty 1

## 2018-08-13 MED ORDER — ACETAMINOPHEN 325 MG PO TABS
650.0000 mg | ORAL_TABLET | Freq: Four times a day (QID) | ORAL | Status: DC | PRN
  Administered 2018-08-14 – 2018-08-20 (×4): 650 mg via ORAL
  Filled 2018-08-13 (×4): qty 2

## 2018-08-13 MED ORDER — ASPIRIN EC 81 MG PO TBEC
81.0000 mg | DELAYED_RELEASE_TABLET | Freq: Every day | ORAL | Status: DC
  Administered 2018-08-14 – 2018-08-20 (×7): 81 mg via ORAL
  Filled 2018-08-13 (×7): qty 1

## 2018-08-13 MED ORDER — GABAPENTIN 400 MG PO CAPS
400.0000 mg | ORAL_CAPSULE | Freq: Three times a day (TID) | ORAL | Status: DC
  Administered 2018-08-13 – 2018-08-20 (×21): 400 mg via ORAL
  Filled 2018-08-13 (×22): qty 1

## 2018-08-13 MED ORDER — OXYBUTYNIN CHLORIDE 5 MG PO TABS
5.0000 mg | ORAL_TABLET | Freq: Two times a day (BID) | ORAL | Status: DC
  Administered 2018-08-13 – 2018-08-20 (×14): 5 mg via ORAL
  Filled 2018-08-13 (×14): qty 1

## 2018-08-13 MED ORDER — INSULIN ASPART 100 UNIT/ML ~~LOC~~ SOLN
0.0000 [IU] | Freq: Three times a day (TID) | SUBCUTANEOUS | Status: DC
  Administered 2018-08-14: 2 [IU] via SUBCUTANEOUS
  Administered 2018-08-14: 3 [IU] via SUBCUTANEOUS
  Administered 2018-08-15 – 2018-08-20 (×6): 2 [IU] via SUBCUTANEOUS

## 2018-08-13 MED ORDER — SPIRONOLACTONE 100 MG PO TABS
100.0000 mg | ORAL_TABLET | Freq: Every day | ORAL | Status: DC
  Administered 2018-08-13 – 2018-08-15 (×3): 100 mg via ORAL
  Filled 2018-08-13 (×4): qty 1

## 2018-08-13 MED ORDER — NYSTATIN 100000 UNIT/GM EX POWD
Freq: Four times a day (QID) | CUTANEOUS | Status: DC
  Administered 2018-08-13 – 2018-08-17 (×15): via TOPICAL
  Administered 2018-08-17: 1 g via TOPICAL
  Administered 2018-08-17 – 2018-08-20 (×10): via TOPICAL
  Filled 2018-08-13 (×2): qty 15

## 2018-08-13 MED ORDER — TRAMADOL HCL 50 MG PO TABS
50.0000 mg | ORAL_TABLET | Freq: Four times a day (QID) | ORAL | Status: DC | PRN
  Administered 2018-08-15 – 2018-08-19 (×9): 50 mg via ORAL
  Filled 2018-08-13 (×9): qty 1

## 2018-08-13 MED ORDER — FUROSEMIDE 40 MG PO TABS
40.0000 mg | ORAL_TABLET | Freq: Every day | ORAL | Status: DC
  Administered 2018-08-14 – 2018-08-15 (×2): 40 mg via ORAL
  Filled 2018-08-13 (×2): qty 1

## 2018-08-13 MED ORDER — INSULIN ASPART 100 UNIT/ML ~~LOC~~ SOLN: 0.0000 [IU] | Freq: Every day | SUBCUTANEOUS | Status: DC

## 2018-08-13 MED ORDER — VANCOMYCIN HCL 10 G IV SOLR
2500.0000 mg | Freq: Once | INTRAVENOUS | Status: AC
  Administered 2018-08-13: 2500 mg via INTRAVENOUS
  Filled 2018-08-13: qty 1000

## 2018-08-13 NOTE — ED Notes (Signed)
Bed: WA01 Expected date:  Expected time:  Means of arrival:  Comments: EMS- 60s M, cellulitis

## 2018-08-13 NOTE — ED Notes (Signed)
First set of blood cultures obtained from pts IV. NT to obtain second set

## 2018-08-13 NOTE — H&P (Addendum)
History and Physical    [name] EUM:[id number] DOB: December 05, 1941 DOA: 08/13/2018  PCP: Kathyrn Lass, MD  Patient coming from: Home  Chief Complaint: Left leg cellulitis, failing outpatient treatment  HPI: [name] is a 77 y.o. male with medical history significant of DM, venous stasis, HTN, HLD, stage 3 CKD. Patient was seen in ED on 8/30 for BLE cellulitis. Patient was discharged home with clindamycin. Blood cultures were obtained at that time. Patient noted to have improvement in cellulitis on R however no significant improvement on L. Blood cultures were also noted to have 1/2 gm pos species and pt was instructed to return to ED for further work up.  ED Course: In the ED, patient found to have no leukocytosis. Pt afebrile. LE wounds noted to have open sores with no frank drainage. EDP discussed with ID who stated findings are likely a result of contamination. Given concerns of failure of outpatient tx, hospitalist consulted for consideration for admission.  Review of Systems:  Review of Systems  Constitutional: Positive for chills. Negative for fever and malaise/fatigue.  HENT: Negative for ear discharge, ear pain, sinus pain and tinnitus.   Eyes: Negative for double vision, photophobia and pain.  Respiratory: Negative for hemoptysis, sputum production and shortness of breath.   Cardiovascular: Positive for leg swelling. Negative for palpitations, orthopnea and claudication.  Gastrointestinal: Positive for diarrhea and nausea. Negative for constipation and vomiting.  Genitourinary: Negative for frequency, hematuria and urgency.  Musculoskeletal: Negative for back pain, joint pain and neck pain.  Neurological: Negative for tingling, tremors, speech change, seizures, loss of consciousness and weakness.  Psychiatric/Behavioral: Negative for hallucinations, memory loss and substance abuse.    Past Medical History:  Diagnosis Date  . CAP (community acquired pneumonia)   . CHF  (congestive heart failure) ([place])   . Diabetes ([place])   . Hyperlipidemia   . Hypertension   . Hypotension   . Pleural effusion on right   . Prostate cancer ([place])   . Ulcers of both lower extremities ([place])   . Venous insufficiency (chronic) (peripheral)     History reviewed. No pertinent surgical history.   reports that he has never smoked. He has never used smokeless tobacco. He reports that he drinks about 1.0 standard drinks of alcohol per week. He reports that he does not use drugs.  Allergies  Allergen Reactions  . Penicillins Other (See Comments)    Unknown reaction  Has patient had a PCN reaction causing immediate rash, facial/tongue/throat swelling, SOB or lightheadedness with hypotension: Unknown Has patient had a PCN reaction causing severe rash involving mucus membranes or skin necrosis:  /Unknown Has patient had a PCN reaction that required hospitalization: Unknown Has patient had a PCN reaction occurring within the last 10 years: Unknown If all of the above answers are "NO", then may proceed with Cephalosporin use.   . Sulfa Antibiotics Other (See Comments)    Unknown reaction    Family History  Problem Relation Age of Onset  . Coronary artery disease Father   . Coronary artery disease Mother   . Dementia Mother   . Coronary artery disease Sister     Prior to Admission medications   Medication Sig Start Date End Date Taking? Authorizing Provider  acetaminophen (TYLENOL) 500 MG tablet Take 1,000-1,500 mg by mouth 2 (two) times daily as needed (leg pain).   Yes [provider]  aspirin EC 81 MG tablet Take 81 mg by mouth daily.   Yes [provider]  atorvastatin (LIPITOR) 80 MG tablet Take 80 mg by mouth daily.   Yes [provider]  buPROPion (BUDEPRION XL) 300 MG 24 hr tablet Take 300 mg by mouth daily.     Yes [provider]  clindamycin (CLEOCIN) 300 MG capsule Take 1 capsule (300 mg total) by mouth 4 (four) times daily. X  7 days 08/09/18  Yes Mesner, Corene Cornea, MD  escitalopram (LEXAPRO) 10 MG tablet Take 10 mg by mouth daily.     Yes [provider]  furosemide (LASIX) 20 MG tablet Take 2 tablets (40 mg total) by mouth daily. 02/21/18  Yes Ghimire, Henreitta Leber, MD  gabapentin (NEURONTIN) 400 MG capsule Take 1 capsule (400 mg total) by mouth 3 (three) times daily. 12/12/17  Yes Hongalgi, Lenis Dickinson, MD  KLOR-CON M20 20 MEQ tablet Take 20 mEq by mouth 2 (two) times daily with a meal. 05/29/18  Yes [provider]  losartan (COZAAR) 50 MG tablet Take 50 mg by mouth daily. 06/11/18  Yes [provider]  nystatin (MYCOSTATIN/NYSTOP) powder Apply topically 4 (four) times daily. To groin. 08/09/18  Yes Mesner, Corene Cornea, MD  oxybutynin (DITROPAN) 5 MG tablet Take 5 mg by mouth 2 (two) times daily. 01/22/18  Yes [provider]  Polyvinyl Alcohol-Povidone (REFRESH OP) Place 1 drop into both eyes daily as needed (bacterial infection).    Yes [provider]  Silodosin (RAPAFLO) 8 MG CAPS Take 8 mg by mouth daily.    Yes [provider]  spironolactone (ALDACTONE) 100 MG tablet Take 100 mg by mouth daily.   Yes [provider]    Physical Exam: Vitals:   08/13/18 1205 08/13/18 1208 08/13/18 1230 08/13/18 1300  BP: (!) 153/95  (!) 148/102 134/86  Pulse: 81  75 73  Resp: 18     Temp: 98.3 F (36.8 C)     TempSrc: Oral     SpO2: 99%  99% 99%  Weight:  125.6 kg      Constitutional: NAD, calm, comfortable Vitals:   08/13/18 1205 08/13/18 1208 08/13/18 1230 08/13/18 1300  BP: (!) 153/95  (!) 148/102 134/86  Pulse: 81  75 73  Resp: 18     Temp: 98.3 F (36.8 C)     TempSrc: Oral     SpO2: 99%  99% 99%  Weight:  125.6 kg     Eyes: PERRL, lids and conjunctivae normal ENMT: Mucous membranes are moist. Posterior pharynx clear of any exudate or lesions.Normal dentition.  Neck: normal, supple, no masses, no thyromegaly Respiratory: clear to auscultation bilaterally, no  wheezing, no crackles. Normal respiratory effort. No accessory muscle use.  Cardiovascular: Regular rate and rhythm Abdomen: no tenderness, no masses palpated. No hepatosplenomegaly. Bowel sounds positive.  Musculoskeletal: no clubbing / cyanosis. No joint deformity upper and lower extremities. Good ROM, no contractures. Normal muscle tone.  Skin: RLE with una boot in place. LLE with large patch of erythema and multiple open ulcers, no active drainage. Neurologic: CN 2-12 grossly intact. Sensation intact, DTR normal. Strength 5/5 in all 4.  Psychiatric: Normal judgment and insight. Alert and oriented x 3. Normal mood.    Labs on Admission: I have personally reviewed following labs and imaging studies  CBC: Recent Labs  Lab 08/09/18 1841 08/13/18 1350  WBC 5.3 4.9  NEUTROABS 4.0 3.8  HGB 10.6* 10.6*  HCT 32.8* 32.3*  MCV 91.4 91.0  PLT 245 283   Basic Metabolic Panel: Recent Labs  Lab 08/09/18 1841  08/13/18 1350  NA 140 144  K 4.5 4.2  CL 109 108  CO2 22 24  GLUCOSE 140* 163*  BUN 48* 45*  CREATININE 1.79* 1.89*  CALCIUM 8.6* 9.0   GFR: Estimated Creatinine Clearance: 47.6 mL/min (A) (by C-G formula based on SCr of 1.89 mg/dL (H)). Liver Function Tests: Recent Labs  Lab 08/09/18 1841  AST 14*  ALT 11  ALKPHOS 95  BILITOT 0.5  PROT 7.1  ALBUMIN 3.0*   No results for input(s): LIPASE, AMYLASE in the last 168 hours. No results for input(s): AMMONIA in the last 168 hours. Coagulation Profile: No results for input(s): INR, PROTIME in the last 168 hours. Cardiac Enzymes: No results for input(s): CKTOTAL, CKMB, CKMBINDEX, TROPONINI in the last 168 hours. BNP (last 3 results) No results for input(s): PROBNP in the last 8760 hours. HbA1C: No results for input(s): HGBA1C in the last 72 hours. CBG: No results for input(s): GLUCAP in the last 168 hours. Lipid Profile: No results for input(s): CHOL, HDL, LDLCALC, TRIG, CHOLHDL, LDLDIRECT in the last 72 hours. Thyroid  Function Tests: No results for input(s): TSH, T4TOTAL, FREET4, T3FREE, THYROIDAB in the last 72 hours. Anemia Panel: No results for input(s): VITAMINB12, FOLATE, FERRITIN, TIBC, IRON, RETICCTPCT in the last 72 hours. Urine analysis:    Component Value Date/Time   COLORURINE YELLOW 02/18/2018 1212   APPEARANCEUR CLOUDY (A) 02/18/2018 1212   LABSPEC 1.015 02/18/2018 1212   PHURINE 5.0 02/18/2018 1212   GLUCOSEU 50 (A) 02/18/2018 1212   HGBUR LARGE (A) 02/18/2018 1212   BILIRUBINUR NEGATIVE 02/18/2018 1212   KETONESUR NEGATIVE 02/18/2018 1212   PROTEINUR 100 (A) 02/18/2018 1212   UROBILINOGEN 0.2 06/09/2011 0522   NITRITE NEGATIVE 02/18/2018 1212   LEUKOCYTESUR NEGATIVE 02/18/2018 1212   Sepsis Labs: !!!!!!!!!!!!!!!!!!!!!!!!!!!!!!!!!!!!!!!!!!!! @LABRCNTIP (procalcitonin:4,lacticidven:4) ) Recent Results (from the past 240 hour(s))  Blood Culture (routine x 2)     Status: None (Preliminary result)   Collection Time: 08/09/18  5:45 PM  Result Value Ref Range Status   Specimen Description BLOOD LEFT ARM  Final   Special Requests   Final    BOTTLES DRAWN AEROBIC AND ANAEROBIC Blood Culture adequate volume Performed at [facility], [place] [address], [place], [place] [address]    Culture  Setup Time   Final    GRAM POSITIVE COCCI AEROBIC BOTTLE ONLY CRITICAL RESULT CALLED TO, READ BACK BY AND VERIFIED WITH: Casandra Doffing RN [id number] 08/13/18 A BROWNING    Culture GRAM POSITIVE COCCI  Final   Report Status PENDING  Incomplete  Blood Culture ID Panel (Reflexed)     Status: None   Collection Time: 08/09/18  5:45 PM  Result Value Ref Range Status   Enterococcus species NOT DETECTED NOT DETECTED Final   Listeria monocytogenes NOT DETECTED NOT DETECTED Final   Staphylococcus species NOT DETECTED NOT DETECTED Final   Staphylococcus aureus NOT DETECTED NOT DETECTED Final   Streptococcus species NOT DETECTED NOT DETECTED Final   Streptococcus agalactiae NOT DETECTED NOT DETECTED  Final   Streptococcus pneumoniae NOT DETECTED NOT DETECTED Final   Streptococcus pyogenes NOT DETECTED NOT DETECTED Final   Acinetobacter baumannii NOT DETECTED NOT DETECTED Final   Enterobacteriaceae species NOT DETECTED NOT DETECTED Final   Enterobacter cloacae complex NOT DETECTED NOT DETECTED Final   Escherichia coli NOT DETECTED NOT DETECTED Final   Klebsiella oxytoca NOT DETECTED NOT DETECTED Final   Klebsiella pneumoniae NOT DETECTED NOT DETECTED Final   Proteus species NOT DETECTED NOT DETECTED  Final   Serratia marcescens NOT DETECTED NOT DETECTED Final   Haemophilus influenzae NOT DETECTED NOT DETECTED Final   Neisseria meningitidis NOT DETECTED NOT DETECTED Final   Pseudomonas aeruginosa NOT DETECTED NOT DETECTED Final   Candida albicans NOT DETECTED NOT DETECTED Final   Candida glabrata NOT DETECTED NOT DETECTED Final   Candida krusei NOT DETECTED NOT DETECTED Final   Candida parapsilosis NOT DETECTED NOT DETECTED Final   Candida tropicalis NOT DETECTED NOT DETECTED Final    Comment: Performed at [facility], [place] [address], [place], [place] [address]  Blood Culture (routine x 2)     Status: None (Preliminary result)   Collection Time: 08/09/18  6:04 PM  Result Value Ref Range Status   Specimen Description   Final    BLOOD RIGHT ARM Performed at [place] [address], [place], [place] [address]    Special Requests   Final    BOTTLES DRAWN AEROBIC AND ANAEROBIC Blood Culture adequate volume Performed at [place] [address] [place], [place] [address]    Culture   Final    NO GROWTH 4 DAYS Performed at [facility], [place] [address], [place], [place] [address]    Report Status PENDING  Incomplete     Radiological Exams on Admission: No results found.  EKG: Independently reviewed. 8/31 EKG reviewed, sinus  Assessment/Plan Principal Problem:   Cellulitis of left leg Active Problems:   Type 2  diabetes mellitus with hyperlipidemia (HCC)   Hyperlipidemia   VENOUS STASIS ULCER   1. LLE cellulitis, failing outpatient antibiotics 1. Initially seen in ED on 8/30 for BLE cellulitis, given course of clindamycin and discharged home. Blood cultures obtained at that time 2. Pt noted clinical improvement in RLE however LLE did not seem to respond to PO abx regimen 3. Pt also found to have 1/2 gm pos organisms in above blood cx and was instructed to return to ED 4. Currently afebrile with no leukocytosis 5. Will continue patient on empiric vanc. 2. DM2 1. Reportedly was taken off insulin 2. Most recent a1c from 3/19 noted to be 6.4. Will repeat a1c 3. Continue on SSI coverage 3. HTN 1. BP currently stable 2. Continue Cozaar and aldactone 3. Repeat bmet in AM 4. HLD 1. Will continue statin per home regimen 2. Appears to be stable 5. Venous stasis 1. Consult WOC 6. Morbid Obesity 1. Appears stable at present 2. Will consult nutrition 7. Gram po bacteremia 1. Noted to have 1/2 gm pos organisms 2. EDP did discuss with ID who had stated findings likely contaminant 3. Will repeat blood cultures 8. Stage 3 CKD 1. Appears near baseline 2. Repeat BMET in AM 9. Conjunctivitis 1. Patient reports B eye irritation and matting involving both eyes over the past week with limited improvement with refresh eye drops 2. Will start abx eye drops 10. Venous stasis 1. Will consult WOC  DVT prophylaxis: Lovenox subQ  Code Status: Full Family Communication: Pt in room  Disposition Plan: Uncertain at this time  Consults called:  Admission status: Observation status as would likely require less than 2 midnight stay for IV abx to improve cellulitis   Marylu Lund MD [facility] Pager On Amion  If 7PM-7AM, please contact night-coverage  08/13/2018, 3:09 PM

## 2018-08-13 NOTE — ED Notes (Signed)
Pt provided with Meal tray

## 2018-08-13 NOTE — ED Triage Notes (Signed)
Pt arrives from home via EMS. Per EMS: Pt was seen here on Friday and dx with cellulitis of bilateral lower extremities. Pt reports that the cellulitis is worsening and was advised by his PCP to return to ED.

## 2018-08-13 NOTE — Progress Notes (Signed)
Pharmacy Antibiotic Note  [name] is a 77 y.o. male admitted on 08/13/2018 with cellulitis, which has not improved on outpatient use of clindamycin.  PMH significant for DM, venous stasis, HTN, stage 3 CKD.  Pharmacy has been consulted for Vancomycin dosing.  Plan:  Vancomycin 2500mg  IV x 1 loading dose (20 mg/kg) followed by 1750mg  IV q36h (goal AUC 400-500)  F/u culture results/sensitivities  Follow renal function  Weight: 276 lb 14.4 oz (125.6 kg)  Temp (24hrs), Avg:98.3 F (36.8 C), Min:98.3 F (36.8 C), Max:98.3 F (36.8 C)  Recent Labs  Lab 08/09/18 1816 08/09/18 1841 08/13/18 1350  WBC  --  5.3 4.9  CREATININE  --  1.79* 1.89*  LATICACIDVEN 1.12  --   --     Estimated Creatinine Clearance: 47.6 mL/min (A) (by C-G formula based on SCr of 1.89 mg/dL (H)).    Allergies  Allergen Reactions  . Penicillins Other (See Comments)    Unknown reaction  Has patient had a PCN reaction causing immediate rash, facial/tongue/throat swelling, SOB or lightheadedness with hypotension: Unknown Has patient had a PCN reaction causing severe rash involving mucus membranes or skin necrosis:  /Unknown Has patient had a PCN reaction that required hospitalization: Unknown Has patient had a PCN reaction occurring within the last 10 years: Unknown If all of the above answers are "NO", then may proceed with Cephalosporin use.   . Sulfa Antibiotics Other (See Comments)    Unknown reaction    Antimicrobials this admission: 9/3 Vanc >>     Dose adjustments this admission:    Microbiology results: 9/3 BCx: sent  Thank you for allowing pharmacy to be a part of this patient's care.  Everette Rank, PharmD 08/13/2018 4:04 PM

## 2018-08-13 NOTE — ED Notes (Signed)
ED Provider at bedside. 

## 2018-08-13 NOTE — ED Provider Notes (Signed)
[facility] DEPT Provider Note   CSN: [id number] Arrival date & time: 08/13/18  1149     History   Chief Complaint Chief Complaint  Patient presents with  . Cellulitis    HPI [name] is a 77 y.o. male.  77 year old male brought in by EMS from home, wife at bedside.  Patient has an history of diabetes (recetnly taken off of his insulin, states blood sugars in the 240s at home), chronic venous insufficiency, ulcers to both lower extremities, seen in the emergency room on August 30 and diagnosed with bilateral lower extremity cellulitis, discharged home with clindamycin.  Patient states that his right leg seems to be getting better, the left leg is no better, no worse.  Patient was called by the hospital today and advised to return to the hospital for a positive blood culture.  Patient is cared for at home by multiple and home health care providers including wound care, has an Unna boot to his right lower leg, has PT and OT in the home as well.  No other complaints or concerns.     Past Medical History:  Diagnosis Date  . CAP (community acquired pneumonia)   . CHF (congestive heart failure) ([place])   . Diabetes ([place])   . Hyperlipidemia   . Hypertension   . Hypotension   . Pleural effusion on right   . Prostate cancer ([place])   . Ulcers of both lower extremities ([place])   . Venous insufficiency (chronic) (peripheral)     Patient Active Problem List   Diagnosis Date Noted  . Anemia 02/19/2018  . Dermatitis associated with moisture from urinary incontinence 02/19/2018  . Acute encephalopathy 02/18/2018  . Acute kidney injury superimposed on CKD ([place]) 02/18/2018  . Dehydration 02/18/2018  . Leukocytosis 02/18/2018  . Cellulitis of leg, right 02/18/2018  . Cellulitis of flank 02/18/2018  . Cellulitis of perineum 02/18/2018  . Cellulitis of buttock   . ARF (acute renal failure) ([place])   . CAP (community acquired pneumonia) 12/06/2017  . Dyspnea  12/06/2017  . Acute heart failure ([place]) 12/06/2017  . Pleural effusion on right 12/06/2017  . Diabetes ([place])   . WOUND, LEG 01/01/2011  . [place] SHOULDER REGION 11/23/2010  . Decubitus ulcer of heel 11/16/2010  . ADENOCARCINOMA, PROSTATE 09/13/2010  . Type 2 diabetes mellitus with hyperlipidemia ([place]) 09/13/2010  . Hyperlipidemia 09/13/2010  . OBESITY, MORBID 09/13/2010  . ANXIETY DEPRESSION 09/13/2010  . HYPERTENSION, BENIGN ESSENTIAL 09/13/2010  . VENOUS STASIS ULCER 09/13/2010  . Venous (peripheral) insufficiency 09/13/2010  . DEGENERATIVE JOINT DISEASE, HIPS 09/13/2010  . Backache 09/13/2010  . GAIT DISTURBANCE 09/13/2010  . INCONTINENCE, URGE 09/13/2010  . HIP REPLACEMENT, BILATERAL, HX OF 09/13/2010    History reviewed. No pertinent surgical history.      Home Medications    Prior to Admission medications   Medication Sig Start Date End Date Taking? Authorizing Provider  acetaminophen (TYLENOL) 500 MG tablet Take 1,000-1,500 mg by mouth 2 (two) times daily as needed (leg pain).   Yes [provider]  aspirin EC 81 MG tablet Take 81 mg by mouth daily.   Yes [provider]  atorvastatin (LIPITOR) 80 MG tablet Take 80 mg by mouth daily.   Yes [provider]  buPROPion (BUDEPRION XL) 300 MG 24 hr tablet Take 300 mg by mouth daily.     Yes [provider]  clindamycin (CLEOCIN) 300 MG capsule Take 1 capsule (300 mg total) by  mouth 4 (four) times daily. X 7 days 08/09/18  Yes Mesner, Corene Cornea, MD  escitalopram (LEXAPRO) 10 MG tablet Take 10 mg by mouth daily.     Yes [provider]  furosemide (LASIX) 20 MG tablet Take 2 tablets (40 mg total) by mouth daily. 02/21/18  Yes Ghimire, Henreitta Leber, MD  gabapentin (NEURONTIN) 400 MG capsule Take 1 capsule (400 mg total) by mouth 3 (three) times daily. 12/12/17  Yes Hongalgi, Lenis Dickinson, MD  KLOR-CON M20 20 MEQ tablet Take 20 mEq by mouth 2 (two) times daily with a meal.  05/29/18  Yes [provider]  losartan (COZAAR) 50 MG tablet Take 50 mg by mouth daily. 06/11/18  Yes [provider]  nystatin (MYCOSTATIN/NYSTOP) powder Apply topically 4 (four) times daily. To groin. 08/09/18  Yes Mesner, Corene Cornea, MD  oxybutynin (DITROPAN) 5 MG tablet Take 5 mg by mouth 2 (two) times daily. 01/22/18  Yes [provider]  Polyvinyl Alcohol-Povidone (REFRESH OP) Place 1 drop into both eyes daily as needed (bacterial infection).    Yes [provider]  Silodosin (RAPAFLO) 8 MG CAPS Take 8 mg by mouth daily.    Yes [provider]  spironolactone (ALDACTONE) 100 MG tablet Take 100 mg by mouth daily.   Yes [provider]    Family History Family History  Problem Relation Age of Onset  . Coronary artery disease Father   . Coronary artery disease Mother   . Dementia Mother   . Coronary artery disease Sister     Social History Social History   Tobacco Use  . Smoking status: Never Smoker  . Smokeless tobacco: Never Used  Substance Use Topics  . Alcohol use: Yes    Alcohol/week: 1.0 standard drinks    Types: 1 Cans of beer per week    Frequency: Never  . Drug use: No     Allergies   Penicillins and Sulfa antibiotics   Review of Systems Review of Systems  Constitutional: Negative for chills and fever.  Musculoskeletal: Positive for myalgias.  Skin: Positive for color change and wound.  Allergic/Immunologic: Positive for immunocompromised state.  Psychiatric/Behavioral: Negative for confusion.  All other systems reviewed and are negative.    Physical Exam Updated Vital Signs BP 134/86   Pulse 73   Temp 98.3 F (36.8 C) (Oral)   Resp 18   Wt 125.6 kg   SpO2 99%   BMI 35.55 kg/m   Physical Exam  Constitutional: He is oriented to person, place, and time. He appears well-developed and well-nourished. No distress.  HENT:  Head: Normocephalic and atraumatic.  Eyes: Right eye exhibits no discharge. Left  eye exhibits discharge. Right conjunctiva is not injected. Left conjunctiva is injected.  Neck: Neck supple.  Cardiovascular: Normal rate, regular rhythm and normal heart sounds.  No murmur heard. Pulmonary/Chest: Effort normal and breath sounds normal. No respiratory distress.  Abdominal: Soft. There is no tenderness.  Musculoskeletal: He exhibits tenderness.  Neurological: He is alert and oriented to person, place, and time. No sensory deficit.  Skin: Skin is warm. He is not diaphoretic. There is erythema.     Psychiatric: He has a normal mood and affect. His behavior is normal.  Nursing note and vitals reviewed.    ED Treatments / Results  Labs (all labs ordered are listed, but only abnormal results are displayed) Labs Reviewed  CBC WITH DIFFERENTIAL/PLATELET - Abnormal; Notable for the following components:      Result Value   RBC  3.55 (*)    Hemoglobin 10.6 (*)    HCT 32.3 (*)    Lymphs Abs 0.6 (*)    All other components within normal limits  BASIC METABOLIC PANEL - Abnormal; Notable for the following components:   Glucose, Bld 163 (*)    BUN 45 (*)    Creatinine, Ser 1.89 (*)    GFR calc non Af Amer 33 (*)    GFR calc Af Amer 38 (*)    All other components within normal limits    EKG None  Radiology No results found.  Procedures Procedures (including critical care time)  Medications Ordered in ED Medications - No data to display   Initial Impression / Assessment and Plan / ED Course  I have reviewed the triage vital signs and the nursing notes.  Pertinent labs & imaging results that were available during my care of the patient were reviewed by me and considered in my medical decision making (see chart for details).  Clinical Course as of Aug 13 1499  Tue Aug 14, 6471  [id number] 77 year old male brought in by EMS from home, wife at bedside.  Patient was seen in emergency room on August 30 and given clindamycin for bilateral lower extremity cellulitis.   Patient's wife called PCP office today for concerns for left leg not improving on antibiotics.  Patient was also contacted by the hospital today to return to emergency room for reevaluation after positive blood culture.  Review of blood cultures, patient had one of the 2 cultures positive for gram-positive cocci, discussed with Dr. Megan Salon, infectious disease, states likely insignificant contaminant.  Patient and wife are concerned that despite their best efforts this left leg is not improving on the antibiotics, states home health nurse has put an Unna boot on this leg previously.  Patient and wife requesting admission to the hospital for further wound care and monitoring as patient is unable to transfer to get to his PCP for regular care and rechecks. Labs ordered prior to consult and are pending.    [LM]    Clinical Course User Index [LM] Tacy Learn, PA-C    Final Clinical Impressions(s) / ED Diagnoses   Final diagnoses:  Left leg cellulitis    ED Discharge Orders    None       Tacy Learn, PA-C 08/13/18 1500    Davonna Belling, MD 08/13/18 [phone]

## 2018-08-13 NOTE — ED Notes (Signed)
ED TO INPATIENT HANDOFF REPORT  Name/Age/Gender [name] 77 y.o. male  Code Status    Code Status Orders  (From admission, onward)         Start     Ordered   08/13/18 1544  Full code  Continuous     08/13/18 1544        Code Status History    Date Active Date Inactive Code Status Order ID Comments User Context   02/18/2018 2018 02/22/2018 1933 Full Code [id number]  Eugenie Filler, MD Inpatient   12/06/2017 1143 12/12/2017 1947 Full Code [id number]  Radene Gunning, NP ED    Advance Directive Documentation     Most Recent Value  Type of Advance Directive  Healthcare Power of Attorney, Living will  Pre-existing out of facility DNR order (yellow form or pink MOST form)  -  "MOST" Form in Place?  -      Home/SNF/Other Home  Chief Complaint lower leg infection   Level of Care/Admitting Diagnosis ED Disposition    ED Disposition Condition [place]: [facility] [[id number]]  Level of Care: Med-Surg [16]  Diagnosis: Cellulitis of left leg [683419]  Admitting Physician: Donne Hazel [[id number]]  Attending Physician: CHIU, STEPHEN K [[id number]]  PT Class (Do Not Modify): Observation [104]  PT Acc Code (Do Not Modify): Observation [10022]       Medical History Past Medical History:  Diagnosis Date  . CAP (community acquired pneumonia)   . CHF (congestive heart failure) ([place])   . Diabetes ([place])   . Hyperlipidemia   . Hypertension   . Hypotension   . Pleural effusion on right   . Prostate cancer ([place])   . Ulcers of both lower extremities ([place])   . Venous insufficiency (chronic) (peripheral)     Allergies Allergies  Allergen Reactions  . Penicillins Other (See Comments)    Unknown reaction  Has patient had a PCN reaction causing immediate rash, facial/tongue/throat swelling, SOB or lightheadedness with hypotension: Unknown Has patient had a PCN reaction causing severe rash involving mucus membranes or skin necrosis:   /Unknown Has patient had a PCN reaction that required hospitalization: Unknown Has patient had a PCN reaction occurring within the last 10 years: Unknown If all of the above answers are "NO", then may proceed with Cephalosporin use.   . Sulfa Antibiotics Other (See Comments)    Unknown reaction    IV Location/Drains/Wounds Patient Lines/Drains/Airways Status   Active Line/Drains/Airways    Name:   Placement date:   Placement time:   Site:   Days:   Peripheral IV 08/13/18 Right Forearm   08/13/18    1402    Forearm   less than 1   External Urinary Catheter   02/20/18    2007    -   174   Pressure Injury 12/06/17 Stage I -  Intact skin with non-blanchable redness of a localized area usually over a bony prominence.   12/06/17    0419     250   Wound / Incision (Open or Dehisced) 02/18/18 Other (Comment) Heel Left Red, shiny, open area over the left heel  02/19/18 WOC assessment this is pressure related, not venous stasis   02/18/18    2030    Heel   176          Labs/Imaging Results for orders placed or performed during the hospital encounter of 08/13/18 (from the past 48 hour(s))  CBC with Differential     Status: Abnormal   Collection Time: 08/13/18  1:50 PM  Result Value Ref Range   WBC 4.9 4.0 - 10.5 K/uL   RBC 3.55 (L) 4.22 - 5.81 MIL/uL   Hemoglobin 10.6 (L) 13.0 - 17.0 g/dL   HCT 32.3 (L) 39.0 - 52.0 %   MCV 91.0 78.0 - 100.0 fL   MCH 29.9 26.0 - 34.0 pg   MCHC 32.8 30.0 - 36.0 g/dL   RDW 14.4 11.5 - 15.5 %   Platelets 246 150 - 400 K/uL   Neutrophils Relative % 77 %   Neutro Abs 3.8 1.7 - 7.7 K/uL   Lymphocytes Relative 12 %   Lymphs Abs 0.6 (L) 0.7 - 4.0 K/uL   Monocytes Relative 7 %   Monocytes Absolute 0.4 0.1 - 1.0 K/uL   Eosinophils Relative 3 %   Eosinophils Absolute 0.1 0.0 - 0.7 K/uL   Basophils Relative 1 %   Basophils Absolute 0.1 0.0 - 0.1 K/uL    Comment: Performed at [facility], [place] [address], [place], [place] [address]  Basic  metabolic panel     Status: Abnormal   Collection Time: 08/13/18  1:50 PM  Result Value Ref Range   Sodium 144 135 - 145 mmol/L   Potassium 4.2 3.5 - 5.1 mmol/L   Chloride 108 98 - 111 mmol/L   CO2 24 22 - 32 mmol/L   Glucose, Bld 163 (H) 70 - 99 mg/dL   BUN 45 (H) 8 - 23 mg/dL   Creatinine, Ser 1.89 (H) 0.61 - 1.24 mg/dL   Calcium 9.0 8.9 - 10.3 mg/dL   GFR calc non Af Amer 33 (L) >60 mL/min   GFR calc Af Amer 38 (L) >60 mL/min    Comment: (NOTE) The eGFR has been calculated using the CKD EPI equation. This calculation has not been validated in all clinical situations. eGFR's persistently <60 mL/min signify possible Chronic Kidney Disease.    Anion gap 12 5 - 15    Comment: Performed at [facility], [place] [address] [place], [place] [address]   No results found.  Pending Labs Unresulted Labs (From admission, onward)    Start     Ordered   08/20/18 0500  Creatinine, serum  (enoxaparin (LOVENOX)    CrCl >/= 30 ml/min)  Weekly,   R    Comments:  while on enoxaparin therapy    08/13/18 1544   08/14/18 0500  Comprehensive metabolic panel  Tomorrow morning,   R     08/13/18 1544   08/14/18 0500  CBC  Tomorrow morning,   R     08/13/18 1544   08/13/18 1600  Culture, blood (routine x 2)  BLOOD CULTURE X 2,   R    Comments:  for severe disease only    08/13/18 1600   08/13/18 1544  CBC  (enoxaparin (LOVENOX)    CrCl >/= 30 ml/min)  Once,   R    Comments:  Baseline for enoxaparin therapy IF NOT ALREADY DRAWN.  Notify MD if PLT < 100 K.    08/13/18 1544   08/13/18 1544  Creatinine, serum  (enoxaparin (LOVENOX)    CrCl >/= 30 ml/min)  Once,   R    Comments:  Baseline for enoxaparin therapy IF NOT ALREADY DRAWN.    08/13/18 1544   08/13/18 1544  Hemoglobin A1c  Once,   R     08/13/18 1544  Vitals/Pain Today's Vitals   08/13/18 1530 08/13/18 1545 08/13/18 1600 08/13/18 1700  BP: 139/79  138/71 139/78  Pulse: 76 77 73 75  Resp:      Temp:       TempSrc:      SpO2: 94% 98% 96% 98%  Weight:      PainSc:        Isolation Precautions No active isolations  Medications Medications  enoxaparin (LOVENOX) injection 40 mg (has no administration in time range)  acetaminophen (TYLENOL) tablet 650 mg (has no administration in time range)    Or  acetaminophen (TYLENOL) suppository 650 mg (has no administration in time range)  traMADol (ULTRAM) tablet 50 mg (has no administration in time range)  insulin aspart (novoLOG) injection 0-15 Units (has no administration in time range)  insulin aspart (novoLOG) injection 0-5 Units (has no administration in time range)  aspirin EC tablet 81 mg (has no administration in time range)  atorvastatin (LIPITOR) tablet 80 mg (has no administration in time range)  buPROPion (WELLBUTRIN XL) 24 hr tablet 300 mg (has no administration in time range)  escitalopram (LEXAPRO) tablet 10 mg (has no administration in time range)  furosemide (LASIX) tablet 40 mg (has no administration in time range)  gabapentin (NEURONTIN) capsule 400 mg (has no administration in time range)  losartan (COZAAR) tablet 50 mg (has no administration in time range)  nystatin (MYCOSTATIN/NYSTOP) topical powder (has no administration in time range)  oxybutynin (DITROPAN) tablet 5 mg (has no administration in time range)  spironolactone (ALDACTONE) tablet 100 mg (has no administration in time range)  neomycin-polymyxin b-dexamethasone (MAXITROL) ophthalmic suspension 1 drop (has no administration in time range)  vancomycin (VANCOCIN) 2,500 mg in sodium chloride 0.9 % 500 mL IVPB (2,500 mg Intravenous New Bag/Given 08/13/18 1700)  vancomycin (VANCOCIN) 1,750 mg in sodium chloride 0.9 % 500 mL IVPB (has no administration in time range)    Mobility manual wheelchair

## 2018-08-13 NOTE — ED Provider Notes (Signed)
Informed pt had +blood culture Patient to be called and return to the ER for evaluation and Infectious Disease consult.   Ripley Fraise, MD 08/13/18 [id number]

## 2018-08-14 DIAGNOSIS — M2550 Pain in unspecified joint: Secondary | ICD-10-CM | POA: Diagnosis not present

## 2018-08-14 DIAGNOSIS — I83028 Varicose veins of left lower extremity with ulcer other part of lower leg: Secondary | ICD-10-CM | POA: Diagnosis present

## 2018-08-14 DIAGNOSIS — I83029 Varicose veins of left lower extremity with ulcer of unspecified site: Secondary | ICD-10-CM | POA: Diagnosis not present

## 2018-08-14 DIAGNOSIS — C61 Malignant neoplasm of prostate: Secondary | ICD-10-CM | POA: Diagnosis not present

## 2018-08-14 DIAGNOSIS — E1022 Type 1 diabetes mellitus with diabetic chronic kidney disease: Secondary | ICD-10-CM | POA: Diagnosis not present

## 2018-08-14 DIAGNOSIS — R41841 Cognitive communication deficit: Secondary | ICD-10-CM | POA: Diagnosis not present

## 2018-08-14 DIAGNOSIS — I872 Venous insufficiency (chronic) (peripheral): Secondary | ICD-10-CM | POA: Diagnosis present

## 2018-08-14 DIAGNOSIS — N183 Chronic kidney disease, stage 3 (moderate): Secondary | ICD-10-CM

## 2018-08-14 DIAGNOSIS — R1312 Dysphagia, oropharyngeal phase: Secondary | ICD-10-CM | POA: Diagnosis not present

## 2018-08-14 DIAGNOSIS — E1140 Type 2 diabetes mellitus with diabetic neuropathy, unspecified: Secondary | ICD-10-CM | POA: Diagnosis present

## 2018-08-14 DIAGNOSIS — L03115 Cellulitis of right lower limb: Secondary | ICD-10-CM | POA: Diagnosis present

## 2018-08-14 DIAGNOSIS — L03116 Cellulitis of left lower limb: Secondary | ICD-10-CM | POA: Diagnosis not present

## 2018-08-14 DIAGNOSIS — R0600 Dyspnea, unspecified: Secondary | ICD-10-CM | POA: Diagnosis not present

## 2018-08-14 DIAGNOSIS — I5032 Chronic diastolic (congestive) heart failure: Secondary | ICD-10-CM | POA: Diagnosis present

## 2018-08-14 DIAGNOSIS — L97909 Non-pressure chronic ulcer of unspecified part of unspecified lower leg with unspecified severity: Secondary | ICD-10-CM | POA: Diagnosis not present

## 2018-08-14 DIAGNOSIS — L97822 Non-pressure chronic ulcer of other part of left lower leg with fat layer exposed: Secondary | ICD-10-CM | POA: Diagnosis not present

## 2018-08-14 DIAGNOSIS — L97828 Non-pressure chronic ulcer of other part of left lower leg with other specified severity: Secondary | ICD-10-CM | POA: Diagnosis present

## 2018-08-14 DIAGNOSIS — N5089 Other specified disorders of the male genital organs: Secondary | ICD-10-CM | POA: Diagnosis present

## 2018-08-14 DIAGNOSIS — J029 Acute pharyngitis, unspecified: Secondary | ICD-10-CM | POA: Diagnosis not present

## 2018-08-14 DIAGNOSIS — I83009 Varicose veins of unspecified lower extremity with ulcer of unspecified site: Secondary | ICD-10-CM | POA: Diagnosis not present

## 2018-08-14 DIAGNOSIS — N189 Chronic kidney disease, unspecified: Secondary | ICD-10-CM | POA: Diagnosis not present

## 2018-08-14 DIAGNOSIS — E785 Hyperlipidemia, unspecified: Secondary | ICD-10-CM

## 2018-08-14 DIAGNOSIS — I130 Hypertensive heart and chronic kidney disease with heart failure and stage 1 through stage 4 chronic kidney disease, or unspecified chronic kidney disease: Secondary | ICD-10-CM | POA: Diagnosis present

## 2018-08-14 DIAGNOSIS — Z96643 Presence of artificial hip joint, bilateral: Secondary | ICD-10-CM | POA: Diagnosis present

## 2018-08-14 DIAGNOSIS — G9341 Metabolic encephalopathy: Secondary | ICD-10-CM | POA: Diagnosis not present

## 2018-08-14 DIAGNOSIS — E1169 Type 2 diabetes mellitus with other specified complication: Secondary | ICD-10-CM

## 2018-08-14 DIAGNOSIS — L97929 Non-pressure chronic ulcer of unspecified part of left lower leg with unspecified severity: Secondary | ICD-10-CM | POA: Diagnosis not present

## 2018-08-14 DIAGNOSIS — E1122 Type 2 diabetes mellitus with diabetic chronic kidney disease: Secondary | ICD-10-CM | POA: Diagnosis present

## 2018-08-14 DIAGNOSIS — N179 Acute kidney failure, unspecified: Secondary | ICD-10-CM | POA: Diagnosis present

## 2018-08-14 DIAGNOSIS — R05 Cough: Secondary | ICD-10-CM | POA: Diagnosis not present

## 2018-08-14 DIAGNOSIS — I878 Other specified disorders of veins: Secondary | ICD-10-CM | POA: Diagnosis present

## 2018-08-14 DIAGNOSIS — R498 Other voice and resonance disorders: Secondary | ICD-10-CM | POA: Diagnosis not present

## 2018-08-14 DIAGNOSIS — Z6835 Body mass index (BMI) 35.0-35.9, adult: Secondary | ICD-10-CM | POA: Diagnosis not present

## 2018-08-14 DIAGNOSIS — R2689 Other abnormalities of gait and mobility: Secondary | ICD-10-CM | POA: Diagnosis not present

## 2018-08-14 DIAGNOSIS — I83018 Varicose veins of right lower extremity with ulcer other part of lower leg: Secondary | ICD-10-CM | POA: Diagnosis present

## 2018-08-14 DIAGNOSIS — H109 Unspecified conjunctivitis: Secondary | ICD-10-CM | POA: Diagnosis present

## 2018-08-14 DIAGNOSIS — L89623 Pressure ulcer of left heel, stage 3: Secondary | ICD-10-CM | POA: Diagnosis present

## 2018-08-14 DIAGNOSIS — L97818 Non-pressure chronic ulcer of other part of right lower leg with other specified severity: Secondary | ICD-10-CM | POA: Diagnosis present

## 2018-08-14 DIAGNOSIS — B372 Candidiasis of skin and nail: Secondary | ICD-10-CM | POA: Diagnosis present

## 2018-08-14 DIAGNOSIS — M6281 Muscle weakness (generalized): Secondary | ICD-10-CM | POA: Diagnosis not present

## 2018-08-14 DIAGNOSIS — Z7401 Bed confinement status: Secondary | ICD-10-CM | POA: Diagnosis not present

## 2018-08-14 LAB — GLUCOSE, CAPILLARY
GLUCOSE-CAPILLARY: 154 mg/dL — AB (ref 70–99)
Glucose-Capillary: 131 mg/dL — ABNORMAL HIGH (ref 70–99)
Glucose-Capillary: 156 mg/dL — ABNORMAL HIGH (ref 70–99)
Glucose-Capillary: 96 mg/dL (ref 70–99)

## 2018-08-14 LAB — CBC
HCT: 30.6 % — ABNORMAL LOW (ref 39.0–52.0)
Hemoglobin: 9.9 g/dL — ABNORMAL LOW (ref 13.0–17.0)
MCH: 29.6 pg (ref 26.0–34.0)
MCHC: 32.4 g/dL (ref 30.0–36.0)
MCV: 91.3 fL (ref 78.0–100.0)
PLATELETS: 227 10*3/uL (ref 150–400)
RBC: 3.35 MIL/uL — AB (ref 4.22–5.81)
RDW: 14.6 % (ref 11.5–15.5)
WBC: 4.2 10*3/uL (ref 4.0–10.5)

## 2018-08-14 LAB — COMPREHENSIVE METABOLIC PANEL
ALBUMIN: 2.7 g/dL — AB (ref 3.5–5.0)
ALK PHOS: 87 U/L (ref 38–126)
ALT: 10 U/L (ref 0–44)
AST: 12 U/L — AB (ref 15–41)
Anion gap: 8 (ref 5–15)
BUN: 42 mg/dL — AB (ref 8–23)
CO2: 24 mmol/L (ref 22–32)
CREATININE: 1.63 mg/dL — AB (ref 0.61–1.24)
Calcium: 8.5 mg/dL — ABNORMAL LOW (ref 8.9–10.3)
Chloride: 109 mmol/L (ref 98–111)
GFR calc non Af Amer: 40 mL/min — ABNORMAL LOW (ref >60)
GFR, EST AFRICAN AMERICAN: 46 mL/min — AB (ref >60)
GLUCOSE: 137 mg/dL — AB (ref 70–99)
Potassium: 4 mmol/L (ref 3.5–5.1)
SODIUM: 141 mmol/L (ref 135–145)
Total Bilirubin: 0.8 mg/dL (ref 0.3–1.2)
Total Protein: 6.5 g/dL (ref 6.5–8.1)

## 2018-08-14 LAB — HEMOGLOBIN A1C
HEMOGLOBIN A1C: 7.3 % — AB (ref 4.8–5.6)
MEAN PLASMA GLUCOSE: 162.81 mg/dL

## 2018-08-14 LAB — CULTURE, BLOOD (ROUTINE X 2)
Culture: NO GROWTH
Special Requests: ADEQUATE

## 2018-08-14 MED ORDER — DICLOFENAC SODIUM 1 % TD GEL
2.0000 g | Freq: Four times a day (QID) | TRANSDERMAL | Status: DC
  Administered 2018-08-14 – 2018-08-20 (×21): 2 g via TOPICAL
  Filled 2018-08-14: qty 100

## 2018-08-14 MED ORDER — JUVEN PO PACK
1.0000 | PACK | Freq: Two times a day (BID) | ORAL | Status: DC
  Administered 2018-08-14 – 2018-08-20 (×9): 1 via ORAL
  Filled 2018-08-14 (×13): qty 1

## 2018-08-14 NOTE — Progress Notes (Addendum)
PROGRESS NOTE    OMER PUCCINELLI   RXV:[id number]  DOB: [birth year]  DOA: 08/13/2018 PCP: Kathyrn Lass, MD   Brief Narrative:  BODHI MORADI is a 77 y.o. male with medical history significant of DM, venous stasis, HTN, HLD, stage 3 CKD. He was seen in the ED on 8/30 for b/l LE cellulitis and was discharged home with clindamycin.    Patient noted to have improvement in cellulitis on R however he states he had no significant improvement on L. Blood cultures noted to have 1/2 gm pos cocci and pt was instructed to return to ED for further work up.   Subjective: Still has swelling in left leg. Having some pain in right thigh and right knee. Has arthritis in the knee.     Assessment & Plan:   Principal Problem:   Cellulitis of left leg- venous statis ulcers - has numerous ulcers on this leg and will need aggressive wound care in addition to the antibiotics - blood cultures showing gram + cocci in 1/2 sets- follow - cont Vanc  - has had venous duplex of legs on 8/30 which was negative for DVT - wound care assisting with management    Active Problems:   Type 2 diabetes mellitus with hyperlipidemia  - apparently taken off insulin as outpt- cont follow in SSI- last A1c was 6.4 on 3/19 and now is 7.3  Diabetic neuropathy - Gabapentin    Hyperlipidemia -Body mass index is 35.55 kg/m.   CKD3 - stable  Chronic diastolic CHF/ HTN - grade 2 d CHF- compensated  - cont Furosemide, Spironolactone, Cozaar  Candida infection in groin - cont nystatin  HLD - Lipitor  DVT prophylaxis: Lovenox Code Status: Full code Family Communication: wife Disposition Plan: home with HHPT/ RN Consultants:     Procedures:     Antimicrobials:  Anti-infectives (From admission, onward)   Start     Dose/Rate Route Frequency Ordered Stop   08/15/18 0500  vancomycin (VANCOCIN) 1,750 mg in sodium chloride 0.9 % 500 mL IVPB     1,750 mg 250 mL/hr over 120 Minutes Intravenous Every 36 hours  08/13/18 1735     08/13/18 1615  vancomycin (VANCOCIN) IVPB 1000 mg/200 mL premix  Status:  Discontinued     1,000 mg 200 mL/hr over 60 Minutes Intravenous  Once 08/13/18 1600 08/13/18 1602   08/13/18 1615  vancomycin (VANCOCIN) 2,500 mg in sodium chloride 0.9 % 500 mL IVPB     2,500 mg 250 mL/hr over 120 Minutes Intravenous  Once 08/13/18 1602 08/13/18 2019       Objective: Vitals:   08/13/18 2218 08/13/18 2300 08/14/18 0530 08/14/18 1317  BP: 132/78  112/67 (!) 107/53  Pulse: 75  70 70  Resp:   20 18  Temp: (!) 97.4 F (36.3 C)  98.2 F (36.8 C) 98 F (36.7 C)  TempSrc: Oral  Oral Oral  SpO2: 97%  94% 98%  Weight:  125.6 kg    Height:  6\' 2"  (1.88 m)      Intake/Output Summary (Last 24 hours) at 08/14/2018 1515 Last data filed at 08/14/2018 1318 Gross per 24 hour  Intake 1010.24 ml  Output 400 ml  Net 610.24 ml   Filed Weights   08/13/18 1208 08/13/18 2300  Weight: 125.6 kg 125.6 kg    Examination: General exam: Appears comfortable  HEENT: PERRLA, oral mucosa moist, no sclera icterus or thrush Respiratory system: Clear to auscultation. Respiratory effort normal. Cardiovascular system: S1 &  S2 heard, RRR.   Gastrointestinal system: Abdomen soft, non-tender, nondistended. Normal bowel sound. No organomegaly Central nervous system: Alert and oriented. No focal neurological deficits. Extremities: No cyanosis, clubbing - left leg quite swollen Skin: multiple b/l venous stasis ulcers on left leg Psychiatry:  Mood & affect appropriate.     Data Reviewed: I have personally reviewed following labs and imaging studies  CBC: Recent Labs  Lab 08/09/18 1841 08/13/18 1350 08/14/18 0515  WBC 5.3 4.9 4.2  NEUTROABS 4.0 3.8  --   HGB 10.6* 10.6* 9.9*  HCT 32.8* 32.3* 30.6*  MCV 91.4 91.0 91.3  PLT 245 246 761   Basic Metabolic Panel: Recent Labs  Lab 08/09/18 1841 08/13/18 1350 08/14/18 0515  NA 140 144 141  K 4.5 4.2 4.0  CL 109 108 109  CO2 22 24 24     GLUCOSE 140* 163* 137*  BUN 48* 45* 42*  CREATININE 1.79* 1.89* 1.63*  CALCIUM 8.6* 9.0 8.5*   GFR: Estimated Creatinine Clearance: 55.2 mL/min (A) (by C-G formula based on SCr of 1.63 mg/dL (H)). Liver Function Tests: Recent Labs  Lab 08/09/18 1841 08/14/18 0515  AST 14* 12*  ALT 11 10  ALKPHOS 95 87  BILITOT 0.5 0.8  PROT 7.1 6.5  ALBUMIN 3.0* 2.7*   No results for input(s): LIPASE, AMYLASE in the last 168 hours. No results for input(s): AMMONIA in the last 168 hours. Coagulation Profile: No results for input(s): INR, PROTIME in the last 168 hours. Cardiac Enzymes: No results for input(s): CKTOTAL, CKMB, CKMBINDEX, TROPONINI in the last 168 hours. BNP (last 3 results) No results for input(s): PROBNP in the last 8760 hours. HbA1C: Recent Labs    08/14/18 0515  HGBA1C 7.3*   CBG: Recent Labs  Lab 08/13/18 2215 08/14/18 0733 08/14/18 1135  GLUCAP 150* 131* 156*   Lipid Profile: No results for input(s): CHOL, HDL, LDLCALC, TRIG, CHOLHDL, LDLDIRECT in the last 72 hours. Thyroid Function Tests: No results for input(s): TSH, T4TOTAL, FREET4, T3FREE, THYROIDAB in the last 72 hours. Anemia Panel: No results for input(s): VITAMINB12, FOLATE, FERRITIN, TIBC, IRON, RETICCTPCT in the last 72 hours. Urine analysis:    Component Value Date/Time   COLORURINE YELLOW 02/18/2018 1212   APPEARANCEUR CLOUDY (A) 02/18/2018 1212   LABSPEC 1.015 02/18/2018 1212   PHURINE 5.0 02/18/2018 1212   GLUCOSEU 50 (A) 02/18/2018 1212   HGBUR LARGE (A) 02/18/2018 1212   BILIRUBINUR NEGATIVE 02/18/2018 1212   KETONESUR NEGATIVE 02/18/2018 1212   PROTEINUR 100 (A) 02/18/2018 1212   UROBILINOGEN 0.2 06/09/2011 0522   NITRITE NEGATIVE 02/18/2018 1212   LEUKOCYTESUR NEGATIVE 02/18/2018 1212   Sepsis Labs: @LABRCNTIP (procalcitonin:4,lacticidven:4) ) Recent Results (from the past 240 hour(s))  Blood Culture (routine x 2)     Status: None (Preliminary result)   Collection Time: 08/09/18   5:45 PM  Result Value Ref Range Status   Specimen Description BLOOD LEFT ARM  Final   Special Requests   Final    BOTTLES DRAWN AEROBIC AND ANAEROBIC Blood Culture adequate volume Performed at [facility], [place] [address] [place], [place] [address]    Culture  Setup Time   Final    GRAM POSITIVE COCCI AEROBIC BOTTLE ONLY CRITICAL RESULT CALLED TO, READ BACK BY AND VERIFIED WITH: Casandra Doffing RN [id number] 08/13/18 A BROWNING    Culture GRAM POSITIVE COCCI  Final   Report Status PENDING  Incomplete  Blood Culture ID Panel (Reflexed)     Status: None  Collection Time: 08/09/18  5:45 PM  Result Value Ref Range Status   Enterococcus species NOT DETECTED NOT DETECTED Final   Listeria monocytogenes NOT DETECTED NOT DETECTED Final   Staphylococcus species NOT DETECTED NOT DETECTED Final   Staphylococcus aureus NOT DETECTED NOT DETECTED Final   Streptococcus species NOT DETECTED NOT DETECTED Final   Streptococcus agalactiae NOT DETECTED NOT DETECTED Final   Streptococcus pneumoniae NOT DETECTED NOT DETECTED Final   Streptococcus pyogenes NOT DETECTED NOT DETECTED Final   Acinetobacter baumannii NOT DETECTED NOT DETECTED Final   Enterobacteriaceae species NOT DETECTED NOT DETECTED Final   Enterobacter cloacae complex NOT DETECTED NOT DETECTED Final   Escherichia coli NOT DETECTED NOT DETECTED Final   Klebsiella oxytoca NOT DETECTED NOT DETECTED Final   Klebsiella pneumoniae NOT DETECTED NOT DETECTED Final   Proteus species NOT DETECTED NOT DETECTED Final   Serratia marcescens NOT DETECTED NOT DETECTED Final   Haemophilus influenzae NOT DETECTED NOT DETECTED Final   Neisseria meningitidis NOT DETECTED NOT DETECTED Final   Pseudomonas aeruginosa NOT DETECTED NOT DETECTED Final   Candida albicans NOT DETECTED NOT DETECTED Final   Candida glabrata NOT DETECTED NOT DETECTED Final   Candida krusei NOT DETECTED NOT DETECTED Final   Candida parapsilosis NOT DETECTED NOT DETECTED Final    Candida tropicalis NOT DETECTED NOT DETECTED Final    Comment: Performed at [facility], [address] [place], [place] [address]  Blood Culture (routine x 2)     Status: None   Collection Time: 08/09/18  6:04 PM  Result Value Ref Range Status   Specimen Description   Final    BLOOD RIGHT ARM Performed at [place] [address] [place], [place] [address]    Special Requests   Final    BOTTLES DRAWN AEROBIC AND ANAEROBIC Blood Culture adequate volume Performed at [place] [address] [place], [place] [address]    Culture   Final    NO GROWTH 5 DAYS Performed at [facility], [place] [address] [place], [place] [address]    Report Status 08/14/2018 FINAL  Final  Culture, blood (routine x 2)     Status: None (Preliminary result)   Collection Time: 08/13/18  4:15 PM  Result Value Ref Range Status   Specimen Description   Final    BLOOD RIGHT FOREARM Performed at [facility] [address] [place], [place] [address]    Special Requests   Final    BOTTLES DRAWN AEROBIC AND ANAEROBIC Blood Culture adequate volume Performed at [facility] [address], [place], [place] [address]    Culture   Final    NO GROWTH < 24 HOURS Performed at [facility] [address], [place], [place] [address]    Report Status PENDING  Incomplete  Culture, blood (routine x 2)     Status: None (Preliminary result)   Collection Time: 08/13/18  4:38 PM  Result Value Ref Range Status   Specimen Description   Final    BLOOD LEFT HAND Performed at [facility] [address], [place], [place] [address]    Special Requests   Final    BOTTLES DRAWN AEROBIC AND ANAEROBIC Blood Culture adequate volume Performed at [facility] [address], [place], [place] [address]    Culture   Final    NO GROWTH < 24 HOURS Performed at [facility], [address] [place], [place] [address]    Report Status PENDING  Incomplete         Radiology Studies: No results found.    Scheduled Meds: . aspirin EC  81 mg Oral Daily  . atorvastatin  80 mg Oral Daily  . buPROPion  300 mg Oral Daily  . enoxaparin (LOVENOX) injection  60 mg Subcutaneous Q24H  . escitalopram  10 mg Oral Daily  . furosemide  40 mg Oral Daily  . gabapentin  400 mg Oral TID  . insulin aspart  0-15 Units Subcutaneous TID WC  . insulin aspart  0-5 Units Subcutaneous QHS  . losartan  50 mg Oral Daily  . neomycin-polymyxin b-dexamethasone  1 drop Both Eyes Q6H  . nutrition supplement (JUVEN)  1 packet Oral BID BM  . nystatin   Topical QID  . oxybutynin  5 mg Oral BID  . spironolactone  100 mg Oral Daily   Continuous Infusions: . [START ON 08/15/2018] vancomycin       LOS: 0 days    Time spent in minutes: 35    Debbe Odea, MD [facility] Pager: [email or web address] Password [email or web address] 08/14/2018, 3:15 PM

## 2018-08-14 NOTE — Progress Notes (Signed)
[facility] Infusion Coordinator will follow pt with ID team to support home IVABX at DC as ordered if home is the final DC disposition.  If patient discharges after hours, please call [phone].   [name] 08/14/2018, 6:27 PM

## 2018-08-14 NOTE — Evaluation (Signed)
Occupational Therapy Evaluation Patient Details Name: [name] MRN: [id number] DOB: August 20, 1941 Today's Date: 08/14/2018    History of Present Illness Pt was admitted for LLE cellulitis.  PMH:  prostate CA, anxiety/depression, DM, and bil chronic stasis ulcers   Clinical Impression   This 77 year old man was admitted for the above. At baseline, wife assists him with adls from bed level, he sits up unassisted to eat meals at EOB and HHPT has been working with him on transfers to w/c.  He needs min +2 for bed mobility and was too tired to attempt transfer during this session. Will follow in acute setting with the goals below, at supervision to min A level for mobility to support his wife's assistance with adls at home    Follow Up Recommendations  Supervision/Assistance - 24 hour;Home health OT    Equipment Recommendations  None recommended by OT    Recommendations for Other Services       Precautions / Restrictions Precautions Precaution Comments: skin bil LEs and scrotom Restrictions Weight Bearing Restrictions: No      Mobility Bed Mobility Overal bed mobility: Needs Assistance Bed Mobility: Rolling;Supine to Sit;Sit to Supine Rolling: Mod assist;Min assist(To L; min A to R)   Supine to sit: Min assist;+2 for physical assistance Sit to supine: Min assist;+2 for physical assistance   General bed mobility comments: assist to/from bed to avoid shearing skin  Transfers                 General transfer comment: NT due to fatique today    Balance Overall balance assessment: Needs assistance Sitting-balance support: Feet supported Sitting balance-Leahy Scale: Good                                     ADL either performed or assessed with clinical judgement   ADL Overall ADL's : Needs assistance/impaired                                       General ADL Comments: pt can perform UB adls with min A and LB adls with max/total A  from bed level.  +2 assistance to get to EOB due to skin issues/discomfort. He usually does this and rolls by himself.     Vision         Perception     Praxis      Pertinent Vitals/Pain Pain Assessment: Faces Faces Pain Scale: Hurts even more Pain Location: scrotom when transitioning forward to EOB Pain Descriptors / Indicators: Sore;Discomfort Pain Intervention(s): Limited activity within patient's tolerance;Monitored during session;Repositioned(assisted to decrease pressue)     Hand Dominance     Extremity/Trunk Assessment Upper Extremity Assessment Upper Extremity Assessment: Overall WFL for tasks assessed;RUE deficits/detail RUE Deficits / Details: decreased shoulder ROM; able to use arms functionally for bed mobility:  repositioning up in bed, using bedrails for bed mobility           Communication Communication Communication: No difficulties   Cognition Arousal/Alertness: Awake/alert Behavior During Therapy: WFL for tasks assessed/performed Overall Cognitive Status: Within Functional Limits for tasks assessed                                     General Comments  Exercises     Shoulder Instructions      Home Living Family/patient expects to be discharged to:: Private residence Living Arrangements: Spouse/significant other     Home Access: Ramped entrance                     [place]: Hospital bed;Wheelchair - manual   Additional Comments: has 4 w/cs.  Narrow halls and doorways.  Incontinent, wears pull ups.  Has been working with [place]. Sits up EOB often when [place] not present      Prior Functioning/Environment Level of Independence: Needs assistance        Comments: pt only got up with HHPT.  Rolled in hospital bed for adls and sat EOB for meals        OT Problem List: Decreased strength;Decreased activity tolerance;Pain;Decreased knowledge of use of DME or AE;Decreased range of motion      OT  Treatment/Interventions: Self-care/ADL training;Therapeutic exercise;DME and/or AE instruction;Patient/family education;Therapeutic activities    OT Goals(Current goals can be found in the care plan section) Acute Rehab OT Goals Patient Stated Goal: get strength back OT Goal Formulation: With patient/family Time For Goal Achievement: 08/28/18 Potential to Achieve Goals: Good ADL Goals Pt Will Transfer to Toilet: with min assist;with +2 assist;bedside commode(drop arm lateral scoot) Additional ADL Goal #1: pt will perform rolling to bil sides at supervison level for adls Additional ADL Goal #2: pt will perform supine<>sit at min guard level in preparation for eating from EOB  OT Frequency: Min 2X/week   Barriers to D/C:            Co-evaluation              AM-PAC PT "6 Clicks" Daily Activity     Outcome Measure Help from another person eating meals?: None Help from another person taking care of personal grooming?: A Little Help from another person toileting, which includes using toliet, bedpan, or urinal?: A Lot Help from another person bathing (including washing, rinsing, drying)?: A Lot Help from another person to put on and taking off regular upper body clothing?: A Little Help from another person to put on and taking off regular lower body clothing?: Total 6 Click Score: 15   End of Session    Activity Tolerance: Patient limited by fatigue Patient left: in bed;with call bell/phone within reach;with bed alarm set;with family/visitor present  OT Visit Diagnosis: Muscle weakness (generalized) (M62.81)                Time: 0240-9735 OT Time Calculation (min): 36 min Charges:  OT General Charges $OT Visit: 1 Visit OT Evaluation $OT Eval Low Complexity: 1 Low  [name] [phone] 08/14/2018  [name] 08/14/2018, 10:20 AM

## 2018-08-14 NOTE — Consult Note (Addendum)
[place] Nurse wound consult note Reason for Consult:BLE venous stasis ulcers Wound type:venous insufficiency. BLE edema with hemosiderin stains, multiple ulcerations in various stages of healing. Pt came in with Unna's Boots and MD has ordered them to be replaced. Has chronic stage 3 pressure injury to left heel, 2cm x 3cm x 0.2cm wound with macerated edges and has a deeper 0.3cm x 0.3cm x 0.3cm stage 3 in center. Red wound bed, yellow exudate, calloused area surrounding. Left pretibial has 1cm x 1cm x 0.2cm, a  2cm x 2cm x 0.2cm and a 7cm x 2cm x 0.2cm full thickness wounds with moderate amt of yellow exudate. Once dried exudate cleaned out of wound beds, wound beds are pale pink, non granulating. Foam placed on open draining wounds on left leg.  Right leg is less edematous and has a 1.5cm x 1cm x 0.1cm partial thickness wound in process of healing.Surrounding area all discolored with hemosiderin staining, scaly in some areas. Pressure Injury  POA: Yes Measurement:see aboves Wound bed:see above Drainage (amount, consistency, odor) see above Periwound: see above Dressing procedure/placement/frequency:Pt states he has worn Unna's Boots for 30 years, is seen at [facility] MD has ordered Unna's Boots,  Pt is not ambulatory. Spoke with MD at bedside, no Unna's Boots for now, will order wound care for bedside RNs. Compression wraps with wound care is ordered. Will teach pt's wife technique.  Pt's nutritional level has deteriorated since last admission, recommend Nutritional Consult or dietarty supplements for optimal wound healing, please order if you agree.  Plan is to be discharged tomorrow. Therefore we will not follow, please reconsult if needed.  Fara Olden, RN-C, WTA-C, Interior Wound Treatment Associate Ostomy Care Associate

## 2018-08-14 NOTE — Care Management Note (Signed)
Case Management Note  Patient Details  Name: [name] MRN: [id number] Date of Birth: 1941-12-05  Subjective/Objective:    Plan for d/c to SNF, discharge planning per CSW. [phone]                Action/Plan:   Expected Discharge Date:  (unknown)               Expected Discharge Plan:  [place]  In-House Referral:  Clinical Social Work  Discharge planning Services  CM Consult  Post Acute Care Choice:  NA Choice offered to:  Patient  DME Arranged:  N/A DME Agency:  NA  HH Arranged:  NA HH Agency:  NA  Status of Service:  Completed, signed off  If discussed at [employer] of Stay Meetings, dates discussed:    Additional Comments:  Guadalupe Maple, RN 08/14/2018, 1:47 PM

## 2018-08-14 NOTE — Progress Notes (Signed)
[facility]  Patient Status: Active (receiving services up to time of hospitalization)  [facility] is providing the following services: RN, PT and HHA  If patient discharges after hours, please call [phone].   [name] 08/14/2018, 10:55 AM

## 2018-08-14 NOTE — Evaluation (Signed)
Physical Therapy Evaluation Patient Details Name: [name] MRN: [id number] DOB: Apr 23, 1941 Today's Date: 08/14/2018   History of Present Illness  Pt was admitted for LLE cellulitis.  PMH:  prostate CA, anxiety/depression, DM, and bil chronic stasis ulcers  Clinical Impression  This 77 year old man was admitted for the above. At baseline, wife assists him with adls from bed level, he sits up unassisted to eat meals at EOB and HHPT has been working with him on transfers to w/c.  He needs min +2 for bed mobility and was too tired to attempt transfer during this session. Will follow in acute setting with the goals below, at supervision to min A level for mobility to support his wife's assistance with adls at home       Follow Up Recommendations Home health PT    Equipment Recommendations  None recommended by PT    Recommendations for Other Services       Precautions / Restrictions Precautions Precautions: Fall Precaution Comments: skin bil LEs and scrotom Restrictions Weight Bearing Restrictions: No      Mobility  Bed Mobility Overal bed mobility: Needs Assistance Bed Mobility: Rolling;Supine to Sit;Sit to Supine Rolling: Mod assist;Min assist   Supine to sit: Min assist;+2 for physical assistance Sit to supine: Min assist;+2 for physical assistance   General bed mobility comments: assist to/from bed to avoid shearing skin  Transfers                 General transfer comment: NT due to fatique today  Ambulation/Gait                Stairs            Wheelchair Mobility    Modified Rankin (Stroke Patients Only)       Balance Overall balance assessment: Needs assistance Sitting-balance support: Feet supported Sitting balance-Leahy Scale: Good                                       Pertinent Vitals/Pain Pain Assessment: Faces Faces Pain Scale: Hurts even more Pain Location: scrotom when transitioning forward to EOB Pain  Descriptors / Indicators: Sore;Discomfort Pain Intervention(s): Limited activity within patient's tolerance;Monitored during session;Repositioned    Home Living Family/patient expects to be discharged to:: Private residence Living Arrangements: Spouse/significant other Available Help at Discharge: Family;Available 24 hours/day Type of Home: House Home Access: Ramped entrance     Home Layout: One level Home Equipment: Hospital bed;Wheelchair - manual Additional Comments: has 4 w/cs.  Narrow halls and doorways.  Incontinent, wears pull ups.  Has been working with [facility]. Sits up EOB often when HH not present    Prior Function Level of Independence: Needs assistance   Gait / Transfers Assistance Needed: Pt has only been performing bed to w/c transfers for several years with only the wife assiting. She reports there are 3 WC's, she transfers pt. to clean him and change bed. uses tansportation for out of house.   ADL's / Homemaking Assistance Needed: Pt has been sponge bathing, and not able to get in the shower per son  Comments: pt only got up with HHPT.  Rolled in hospital bed for adls and sat EOB for meals     Hand Dominance        Extremity/Trunk Assessment   Upper Extremity Assessment Upper Extremity Assessment: Defer to OT evaluation RUE Deficits / Details: decreased shoulder  ROM; able to use arms functionally for bed mobility:  repositioning up in bed, using bedrails for bed mobility    Lower Extremity Assessment Lower Extremity Assessment: Generalized weakness    Cervical / Trunk Assessment Cervical / Trunk Assessment: Kyphotic  Communication   Communication: No difficulties  Cognition Arousal/Alertness: Awake/alert Behavior During Therapy: WFL for tasks assessed/performed Overall Cognitive Status: Within Functional Limits for tasks assessed                                        General Comments      Exercises     Assessment/Plan    PT  Assessment Patient needs continued PT services  PT Problem List Decreased strength;Decreased activity tolerance;Decreased balance;Decreased knowledge of use of DME;Decreased mobility;Pain       PT Treatment Interventions DME instruction;Gait training;Functional mobility training;Therapeutic activities;Therapeutic exercise;Balance training;Patient/family education    PT Goals (Current goals can be found in the Care Plan section)  Acute Rehab PT Goals Patient Stated Goal: get strength back PT Goal Formulation: With patient Time For Goal Achievement: 08/28/18 Potential to Achieve Goals: Fair    Frequency Min 3X/week   Barriers to discharge        Co-evaluation PT/OT/SLP Co-Evaluation/Treatment: Yes Reason for Co-Treatment: For patient/therapist safety PT goals addressed during session: Mobility/safety with mobility OT goals addressed during session: ADL's and self-care       AM-PAC PT "6 Clicks" Daily Activity  Outcome Measure Difficulty turning over in bed (including adjusting bedclothes, sheets and blankets)?: A Lot Difficulty moving from lying on back to sitting on the side of the bed? : Unable Difficulty sitting down on and standing up from a chair with arms (e.g., wheelchair, bedside commode, etc,.)?: Unable Help needed moving to and from a bed to chair (including a wheelchair)?: A Lot Help needed walking in hospital room?: A Lot Help needed climbing 3-5 steps with a railing? : Total 6 Click Score: 9    End of Session   Activity Tolerance: Patient tolerated treatment well;Patient limited by fatigue Patient left: in bed;with call bell/phone within reach;with family/visitor present Nurse Communication: Mobility status PT Visit Diagnosis: Difficulty in walking, not elsewhere classified (R26.2);Muscle weakness (generalized) (M62.81)    Time: 9702-6378 PT Time Calculation (min) (ACUTE ONLY): 36 min   Charges:   PT Evaluation $PT Eval Low Complexity: 1 Low           Pg [phone]   [name] 08/14/2018, 1:46 PM

## 2018-08-15 LAB — CULTURE, BLOOD (ROUTINE X 2): SPECIAL REQUESTS: ADEQUATE

## 2018-08-15 LAB — GLUCOSE, CAPILLARY
GLUCOSE-CAPILLARY: 129 mg/dL — AB (ref 70–99)
GLUCOSE-CAPILLARY: 191 mg/dL — AB (ref 70–99)
Glucose-Capillary: 138 mg/dL — ABNORMAL HIGH (ref 70–99)
Glucose-Capillary: 120 mg/dL — ABNORMAL HIGH (ref 70–99)

## 2018-08-15 NOTE — Progress Notes (Signed)
Initial Nutrition Assessment  DOCUMENTATION CODES:   Obesity unspecified  INTERVENTION:   Provide Juven Fruit Punch BID, each serving provides 95kcal and 2.5g of protein (amino acids glutamine and arginine)  NUTRITION DIAGNOSIS:   Increased nutrient needs related to wound healing as evidenced by estimated needs.  GOAL:   Patient will meet greater than or equal to 90% of their needs  MONITOR:   PO intake, Supplement acceptance, Labs, Weight trends, Skin, I & O's  REASON FOR ASSESSMENT:   Consult Assessment of nutrition requirement/status, Diet education  ASSESSMENT:   77 y.o. male with medical history significant of DM, venous stasis, HTN, HLD, stage 3 CKD. He was seen in the ED on 8/30 for b/l LE cellulitis   Patient in room sleeping with wife at bedside. Per pt's wife, pt has been eating well during this admission because he likes the food. Pt is consuming 100% of all meals (averaging ~500-800 kcal and 20-40g of protein each). Pt's wife states while patient was at [facility] he did not eat well because he did not like the food. His blood sugars were not controlled during that time. Now his blood sugars are better controlled and he is eating consistently. Pt's wife also shared that the pt's nephrologist told him to watch his protein intake given CKD 3. Pt has been ordered Juven supplements which only contain ~3 g of protein but contain arginine and glutamine which aid in wound healing.   Pt's wife states pt lost 30-40 lb while at facility. Weight records do not show this. If patient was not eating well, this could have caused weight loss.   Medications: Lasix tablet daily Labs reviewed: CBGs: 138-154 GFR: 40  Hgb A1c: 7.3  NUTRITION - FOCUSED PHYSICAL EXAM:    Most Recent Value  Orbital Region  No depletion  Upper Arm Region  No depletion  Thoracic and Lumbar Region  Unable to assess  Buccal Region  No depletion  Temple Region  No depletion  Clavicle Bone Region  Mild  depletion  Clavicle and Acromion Bone Region  Mild depletion  Scapular Bone Region  Unable to assess  Dorsal Hand  No depletion  Patellar Region  Unable to assess  Anterior Thigh Region  Unable to assess  Posterior Calf Region  Unable to assess  Edema (RD Assessment)  None       Diet Order:   Diet Order            Diet Carb Modified Fluid consistency: Thin; Room service appropriate? Yes  Diet effective now              EDUCATION NEEDS:   Education needs have been addressed  Skin:  Skin Assessment: Reviewed RN Assessment  Last BM:  9/5  Height:   Ht Readings from Last 1 Encounters:  08/13/18 6\' 2"  (1.88 m)    Weight:   Wt Readings from Last 1 Encounters:  08/13/18 125.6 kg    Ideal Body Weight:  86.3 kg  BMI:  Body mass index is 35.55 kg/m.  Estimated Nutritional Needs:   Kcal:  2200-2400  Protein:  80-90g  Fluid:  2L/day   Clayton Bibles, MS, RD, LDN [place] Dietitian Pager: [phone] After Hours Pager: [phone]

## 2018-08-15 NOTE — NC FL2 (Signed)
[place] LEVEL OF CARE SCREENING TOOL     IDENTIFICATION  Patient Name: [name] Birthdate: 1941-05-31 Sex: male Admission Date (Current Location): 08/13/2018  [facility] and [place] Number:  Herbalist and Address:  [facility],  [place] [address] [place]      Provider Number: [id number]  Attending Physician Name and Address:  Debbe Odea, MD  Relative Name and Phone Number:       Current Level of Care: Hospital Recommended Level of Care: [place] Prior Approval Number:    Date Approved/Denied:   PASRR Number: [id number]  Discharge Plan: SNF    Current Diagnoses: Patient Active Problem List   Diagnosis Date Noted  . Cellulitis of left leg 08/13/2018  . Anemia 02/19/2018  . Dermatitis associated with moisture from urinary incontinence 02/19/2018  . Acute encephalopathy 02/18/2018  . Acute kidney injury superimposed on CKD ([place]) 02/18/2018  . Dehydration 02/18/2018  . Leukocytosis 02/18/2018  . Cellulitis of leg, right 02/18/2018  . Cellulitis of flank 02/18/2018  . Cellulitis of perineum 02/18/2018  . Cellulitis of buttock   . ARF (acute renal failure) ([place])   . CAP (community acquired pneumonia) 12/06/2017  . Dyspnea 12/06/2017  . Acute heart failure ([place]) 12/06/2017  . Pleural effusion on right 12/06/2017  . Diabetes ([place])   . WOUND, LEG 01/01/2011  . [place] SHOULDER REGION 11/23/2010  . Decubitus ulcer of heel 11/16/2010  . ADENOCARCINOMA, PROSTATE 09/13/2010  . Type 2 diabetes mellitus with hyperlipidemia ([place]) 09/13/2010  . Hyperlipidemia 09/13/2010  . OBESITY, MORBID 09/13/2010  . ANXIETY DEPRESSION 09/13/2010  . HYPERTENSION, BENIGN ESSENTIAL 09/13/2010  . VENOUS STASIS ULCER 09/13/2010  . Venous (peripheral) insufficiency 09/13/2010  . DEGENERATIVE JOINT DISEASE, HIPS 09/13/2010  . Backache 09/13/2010  . GAIT DISTURBANCE 09/13/2010  . INCONTINENCE, URGE  09/13/2010  . HIP REPLACEMENT, BILATERAL, HX OF 09/13/2010    Orientation RESPIRATION BLADDER Height & Weight     Self, Time, Situation, Place  Normal Incontinent, External catheter Weight: 276 lb 14.4 oz (125.6 kg) Height:  6\' 2"  (188 cm)  BEHAVIORAL SYMPTOMS/MOOD NEUROLOGICAL BOWEL NUTRITION STATUS      Continent Diet(carb modified diet)  AMBULATORY STATUS COMMUNICATION OF NEEDS Skin   Extensive Assist Verbally PU Stage and Appropriate Care(BLE venous stasis ulcers)                       Personal Care Assistance Level of Assistance  Bathing, Feeding, Dressing Bathing Assistance: Maximum assistance Feeding assistance: Independent Dressing Assistance: Maximum assistance     Functional Limitations Info  Sight, Hearing, Speech Sight Info: Adequate Hearing Info: Adequate Speech Info: Adequate    SPECIAL CARE FACTORS FREQUENCY  PT (By licensed PT), OT (By licensed OT)     PT Frequency: 5x OT Frequency: 5x            Contractures Contractures Info: Not present    Additional Factors Info  Code Status, Allergies Code Status Info: full code Allergies Info: Penicillins, Sulfa Antibiotics           Current Medications (08/15/2018):  This is the current hospital active medication list Current Facility-Administered Medications  Medication Dose Route Frequency Provider Last Rate Last Dose  . acetaminophen (TYLENOL) tablet 650 mg  650 mg Oral Q6H PRN Donne Hazel, MD   650 mg at 08/14/18 1405   Or  . acetaminophen (TYLENOL) suppository 650 mg  650 mg Rectal Q6H PRN  Donne Hazel, MD      . aspirin EC tablet 81 mg  81 mg Oral Daily Donne Hazel, MD   81 mg at 08/15/18 0941  . atorvastatin (LIPITOR) tablet 80 mg  80 mg Oral Daily Donne Hazel, MD   80 mg at 08/15/18 0941  . buPROPion (WELLBUTRIN XL) 24 hr tablet 300 mg  300 mg Oral Daily Donne Hazel, MD   300 mg at 08/15/18 0941  . diclofenac sodium (VOLTAREN) 1 % transdermal gel 2 g  2 g Topical QID  Debbe Odea, MD   2 g at 08/14/18 2128  . enoxaparin (LOVENOX) injection 60 mg  60 mg Subcutaneous Q24H Donne Hazel, MD   60 mg at 08/14/18 2129  . escitalopram (LEXAPRO) tablet 10 mg  10 mg Oral Daily Donne Hazel, MD   10 mg at 08/15/18 0941  . furosemide (LASIX) tablet 40 mg  40 mg Oral Daily Donne Hazel, MD   40 mg at 08/15/18 0942  . gabapentin (NEURONTIN) capsule 400 mg  400 mg Oral TID Donne Hazel, MD   400 mg at 08/15/18 0942  . insulin aspart (novoLOG) injection 0-15 Units  0-15 Units Subcutaneous TID WC Donne Hazel, MD   2 Units at 08/15/18 [phone]  . insulin aspart (novoLOG) injection 0-5 Units  0-5 Units Subcutaneous QHS Donne Hazel, MD      . losartan (COZAAR) tablet 50 mg  50 mg Oral Daily Donne Hazel, MD   50 mg at 08/15/18 0942  . neomycin-polymyxin b-dexamethasone (MAXITROL) ophthalmic suspension 1 drop  1 drop Both Eyes Q6H Donne Hazel, MD   1 drop at 08/15/18 0605  . nutrition supplement (JUVEN) (JUVEN) powder packet 1 packet  1 packet Oral BID BM Debbe Odea, MD   1 packet at 08/15/18 [phone]  . nystatin (MYCOSTATIN/NYSTOP) topical powder   Topical QID Donne Hazel, MD      . oxybutynin [facility]) tablet 5 mg  5 mg Oral BID Donne Hazel, MD   5 mg at 08/15/18 0865  . spironolactone (ALDACTONE) tablet 100 mg  100 mg Oral Daily Donne Hazel, MD   100 mg at 08/15/18 0941  . traMADol (ULTRAM) tablet 50 mg  50 mg Oral Q6H PRN Donne Hazel, MD   50 mg at 08/15/18 7846  . vancomycin (VANCOCIN) 1,750 mg in sodium chloride 0.9 % 500 mL IVPB  1,750 mg Intravenous Q36H Poindexter, Leann T, RPH 250 mL/hr at 08/15/18 0558 1,750 mg at 08/15/18 9629     Discharge Medications: Please see discharge summary for a list of discharge medications.  Relevant Imaging Results:  Relevant Lab Results:   Additional Information SSN: [id number]  [place], [place]

## 2018-08-15 NOTE — Progress Notes (Signed)
PROGRESS NOTE    [name]   KZS:[id number]  DOB: January 12, 1941  DOA: 08/13/2018 PCP: Kathyrn Lass, MD   Brief Narrative:  [name] is a 77 y.o. male with medical history significant of DM, venous stasis, HTN, HLD, stage 3 CKD. He was seen in the ED on 8/30 for b/l LE cellulitis and was discharged home with clindamycin.    Patient noted to have improvement in cellulitis on R however he states he had no significant improvement on L. Blood cultures noted to have 1/2 gm pos cocci and pt was instructed to return to ED for further work up.   Subjective: No new complaints. No pain.     Assessment & Plan:   Principal Problem:   Cellulitis of left leg- venous statis ulcers - has numerous ulcers on this leg and will need aggressive wound care in addition to the antibiotics - blood cultures showing gram + cocci in 1/2 sets in aerobic bottle- follow - cont Vanc  - has had venous duplex of legs on 8/30 which was negative for DVT - wound care assisting with management and wound to be cleaned out again today - he will go to SNF for both wound care and debilation   Active Problems:   Type 2 diabetes mellitus with hyperlipidemia  - apparently taken off insulin as outpt- cont follow in [facility]- last A1c was 6.4 on 3/19 and now is 7.3- sugars stable in the hospital  Diabetic neuropathy - Gabapentin    Hyperlipidemia -Body mass index is 35.55 kg/m.   CKD3 - stable  Chronic diastolic CHF/ HTN - grade 2 d CHF- compensated  - cont Furosemide, Spironolactone, Cozaar  Candida infection in groin - cont nystatin  HLD - Lipitor  DVT prophylaxis: Lovenox Code Status: Full code Family Communication: wife Disposition Plan: SNF Consultants:     Procedures:     Antimicrobials:  Anti-infectives (From admission, onward)   Start     Dose/Rate Route Frequency Ordered Stop   08/15/18 0500  vancomycin (VANCOCIN) 1,750 mg in sodium chloride 0.9 % 500 mL IVPB     1,750 mg 250 mL/hr  over 120 Minutes Intravenous Every 36 hours 08/13/18 1735     08/13/18 1615  vancomycin (VANCOCIN) IVPB 1000 mg/200 mL premix  Status:  Discontinued     1,000 mg 200 mL/hr over 60 Minutes Intravenous  Once 08/13/18 1600 08/13/18 1602   08/13/18 1615  vancomycin (VANCOCIN) 2,500 mg in sodium chloride 0.9 % 500 mL IVPB     2,500 mg 250 mL/hr over 120 Minutes Intravenous  Once 08/13/18 1602 08/13/18 2019       Objective: Vitals:   08/14/18 0530 08/14/18 1317 08/14/18 2201 08/15/18 0622  BP: 112/67 (!) 107/53 131/70 132/73  Pulse: 70 70 70 67  Resp: 20 18 20 20   Temp: 98.2 F (36.8 C) 98 F (36.7 C) 98.2 F (36.8 C) (!) 97.5 F (36.4 C)  TempSrc: Oral Oral Oral Oral  SpO2: 94% 98% 97% 93%  Weight:      Height:        Intake/Output Summary (Last 24 hours) at 08/15/2018 1126 Last data filed at 08/15/2018 1004 Gross per 24 hour  Intake 1200 ml  Output 750 ml  Net 450 ml   Filed Weights   08/13/18 1208 08/13/18 2300  Weight: 125.6 kg 125.6 kg    Examination: General exam: Appears comfortable  HEENT: PERRLA, oral mucosa moist, no sclera icterus or thrush Respiratory system: Clear to  auscultation. Respiratory effort normal. Cardiovascular system: S1 & S2 heard, RRR.   Gastrointestinal system: Abdomen soft, non-tender, nondistended. Normal bowel sound. No organomegaly Central nervous system: Alert and oriented. No focal neurological deficits. Extremities: No cyanosis, clubbing - left leg mildy swollen today Skin: multiple b/l venous stasis ulcers on left leg- no change from yesterday Psychiatry:  Mood & affect appropriate.     Data Reviewed: I have personally reviewed following labs and imaging studies  CBC: Recent Labs  Lab 08/09/18 1841 08/13/18 1350 08/14/18 0515  WBC 5.3 4.9 4.2  NEUTROABS 4.0 3.8  --   HGB 10.6* 10.6* 9.9*  HCT 32.8* 32.3* 30.6*  MCV 91.4 91.0 91.3  PLT 245 246 462   Basic Metabolic Panel: Recent Labs  Lab 08/09/18 1841 08/13/18 1350  08/14/18 0515  NA 140 144 141  K 4.5 4.2 4.0  CL 109 108 109  CO2 22 24 24   GLUCOSE 140* 163* 137*  BUN 48* 45* 42*  CREATININE 1.79* 1.89* 1.63*  CALCIUM 8.6* 9.0 8.5*   GFR: Estimated Creatinine Clearance: 55.2 mL/min (A) (by C-G formula based on SCr of 1.63 mg/dL (H)). Liver Function Tests: Recent Labs  Lab 08/09/18 1841 08/14/18 0515  AST 14* 12*  ALT 11 10  ALKPHOS 95 87  BILITOT 0.5 0.8  PROT 7.1 6.5  ALBUMIN 3.0* 2.7*   No results for input(s): LIPASE, AMYLASE in the last 168 hours. No results for input(s): AMMONIA in the last 168 hours. Coagulation Profile: No results for input(s): INR, PROTIME in the last 168 hours. Cardiac Enzymes: No results for input(s): CKTOTAL, CKMB, CKMBINDEX, TROPONINI in the last 168 hours. BNP (last 3 results) No results for input(s): PROBNP in the last 8760 hours. HbA1C: Recent Labs    08/14/18 0515  HGBA1C 7.3*   CBG: Recent Labs  Lab 08/14/18 0733 08/14/18 1135 08/14/18 1632 08/14/18 2156 08/15/18 0804  GLUCAP 131* 156* 96 154* 138*   Lipid Profile: No results for input(s): CHOL, HDL, LDLCALC, TRIG, CHOLHDL, LDLDIRECT in the last 72 hours. Thyroid Function Tests: No results for input(s): TSH, T4TOTAL, FREET4, T3FREE, THYROIDAB in the last 72 hours. Anemia Panel: No results for input(s): VITAMINB12, FOLATE, FERRITIN, TIBC, IRON, RETICCTPCT in the last 72 hours. Urine analysis:    Component Value Date/Time   COLORURINE YELLOW 02/18/2018 1212   APPEARANCEUR CLOUDY (A) 02/18/2018 1212   LABSPEC 1.015 02/18/2018 1212   PHURINE 5.0 02/18/2018 1212   GLUCOSEU 50 (A) 02/18/2018 1212   HGBUR LARGE (A) 02/18/2018 1212   BILIRUBINUR NEGATIVE 02/18/2018 1212   KETONESUR NEGATIVE 02/18/2018 1212   PROTEINUR 100 (A) 02/18/2018 1212   UROBILINOGEN 0.2 06/09/2011 0522   NITRITE NEGATIVE 02/18/2018 1212   LEUKOCYTESUR NEGATIVE 02/18/2018 1212   Sepsis Labs: @LABRCNTIP (procalcitonin:4,lacticidven:4) ) Recent Results (from  the past 240 hour(s))  Blood Culture (routine x 2)     Status: Abnormal   Collection Time: 08/09/18  5:45 PM  Result Value Ref Range Status   Specimen Description   Final    BLOOD LEFT ARM Performed at [facility], [place] [address], [place], [place] [address]    Special Requests   Final    BOTTLES DRAWN AEROBIC AND ANAEROBIC Blood Culture adequate volume Performed at [facility] [address], [place], [place] [address]    Culture  Setup Time   Final    GRAM POSITIVE COCCI AEROBIC BOTTLE ONLY CRITICAL RESULT CALLED TO, READ BACK BY AND VERIFIED WITH: Casandra Doffing RN  1308 08/13/18 A BROWNING    Culture ALLOIOCOCCUS OTITIS (A)  Final   Report Status 08/15/2018 FINAL  Final  Blood Culture ID Panel (Reflexed)     Status: None   Collection Time: 08/09/18  5:45 PM  Result Value Ref Range Status   Enterococcus species NOT DETECTED NOT DETECTED Final   Listeria monocytogenes NOT DETECTED NOT DETECTED Final   Staphylococcus species NOT DETECTED NOT DETECTED Final   Staphylococcus aureus NOT DETECTED NOT DETECTED Final   Streptococcus species NOT DETECTED NOT DETECTED Final   Streptococcus agalactiae NOT DETECTED NOT DETECTED Final   Streptococcus pneumoniae NOT DETECTED NOT DETECTED Final   Streptococcus pyogenes NOT DETECTED NOT DETECTED Final   Acinetobacter baumannii NOT DETECTED NOT DETECTED Final   Enterobacteriaceae species NOT DETECTED NOT DETECTED Final   Enterobacter cloacae complex NOT DETECTED NOT DETECTED Final   Escherichia coli NOT DETECTED NOT DETECTED Final   Klebsiella oxytoca NOT DETECTED NOT DETECTED Final   Klebsiella pneumoniae NOT DETECTED NOT DETECTED Final   Proteus species NOT DETECTED NOT DETECTED Final   Serratia marcescens NOT DETECTED NOT DETECTED Final   Haemophilus influenzae NOT DETECTED NOT DETECTED Final   Neisseria meningitidis NOT DETECTED NOT DETECTED Final   Pseudomonas aeruginosa NOT DETECTED NOT DETECTED Final    Candida albicans NOT DETECTED NOT DETECTED Final   Candida glabrata NOT DETECTED NOT DETECTED Final   Candida krusei NOT DETECTED NOT DETECTED Final   Candida parapsilosis NOT DETECTED NOT DETECTED Final   Candida tropicalis NOT DETECTED NOT DETECTED Final    Comment: Performed at [facility], [address], [place], [place] [address]  Blood Culture (routine x 2)     Status: None   Collection Time: 08/09/18  6:04 PM  Result Value Ref Range Status   Specimen Description   Final    BLOOD RIGHT ARM Performed at [facility] [address], [place], [place] [address]    Special Requests   Final    BOTTLES DRAWN AEROBIC AND ANAEROBIC Blood Culture adequate volume Performed at [facility] [address] [place] [address], [place], [place] [address]    Culture   Final    NO GROWTH 5 DAYS Performed at [facility], [place] [address], [place], [place] [address]    Report Status 08/14/2018 FINAL  Final  Culture, blood (routine x 2)     Status: None (Preliminary result)   Collection Time: 08/13/18  4:15 PM  Result Value Ref Range Status   Specimen Description   Final    BLOOD RIGHT FOREARM Performed at [facility] [address] [place], [place] [address]    Special Requests   Final    BOTTLES DRAWN AEROBIC AND ANAEROBIC Blood Culture adequate volume Performed at [facility] [address], [place], [place] [address]    Culture   Final    NO GROWTH 2 DAYS Performed at [facility] [address] [place],  [address]    Report Status PENDING  Incomplete  Culture, blood (routine x 2)     Status: None (Preliminary result)   Collection Time: 08/13/18  4:38 PM  Result Value Ref Range Status   Specimen Description   Final    BLOOD LEFT HAND Performed at [facility] [address], [place],  [address]    Special Requests   Final    BOTTLES DRAWN AEROBIC AND ANAEROBIC  Blood Culture adequate volume  Performed at [facility], [place] [address], [place], [place] [address]    Culture   Final    NO GROWTH 2 DAYS Performed at [facility] [address] [place], [place] [address]    Report Status PENDING  Incomplete         Radiology Studies: No results found.    Scheduled Meds: . aspirin EC  81 mg Oral Daily  . atorvastatin  80 mg Oral Daily  . buPROPion  300 mg Oral Daily  . diclofenac sodium  2 g Topical QID  . enoxaparin (LOVENOX) injection  60 mg Subcutaneous Q24H  . escitalopram  10 mg Oral Daily  . furosemide  40 mg Oral Daily  . gabapentin  400 mg Oral TID  . insulin aspart  0-15 Units Subcutaneous TID WC  . insulin aspart  0-5 Units Subcutaneous QHS  . losartan  50 mg Oral Daily  . neomycin-polymyxin b-dexamethasone  1 drop Both Eyes Q6H  . nutrition supplement (JUVEN)  1 packet Oral BID BM  . nystatin   Topical QID  . oxybutynin  5 mg Oral BID  . spironolactone  100 mg Oral Daily   Continuous Infusions: . vancomycin 1,750 mg (08/15/18 0558)     LOS: 1 day    Time spent in minutes: 35    Debbe Odea, MD [facility] Pager: [email or web address] Password [email or web address] 08/15/2018, 11:26 AM

## 2018-08-15 NOTE — Clinical Social Work Note (Addendum)
Clinical Social Work Assessment  Patient Details  Name: [name] MRN: [id number] Date of Birth: 07/13/1941  Date of referral:  08/15/18               Reason for consult:  Discharge Planning                Permission sought to share information with:  Family Supports, Facility Contact Representative Permission granted to share information::  Yes, Verbal Permission Granted  Name::       Manton,[name]  Agency::  SNF   Relationship::  Spouse  Contact Information:    [phone] Housing/Transportation Living arrangements for the past 2 months:  Single Family Home Source of Information:  Patient Patient Interpreter Needed:  None Criminal Activity/Legal Involvement Pertinent to Current Situation/Hospitalization:  No - Comment as needed Significant Relationships:  Spouse, Adult Children Lives with:  Spouse Do you feel safe going back to the place where you live?  Yes Need for family participation in patient care:  Yes (Comment)  Care giving concerns:   Patient admitted for left leg cellulitis, failing outpatient treatment.  SNF placement for rehab/ wound care.   Social Worker assessment / plan:  CSW met with the patient and spouse at bedside, explain role and reason for visit-to assist with discharge to SNF.  Patient and spouse are both agreeable to SNF placement for rehab. The patient is currently requiring assistance with transferring into his wheel and needs wound care. Patient spouse states he is the patient primary caretaker and hopes the patient can transfer from wheel after his rehab.   Patient spouse reports the patient has been to [facility] and [facility]. Patient spouse provided CSW with a list of preferred facilities.  CSW provided list of bed offers and confirmed SNF bed at [facility].  Patient will transport by [facility] on Saturday.   FL2 complete.  PASRR complete.   Plan: SNF   Employment status:  Retired Insurance information:  Medicare PT Recommendations:  Skilled Nursing  Facility Information / Referral to community resources:  Skilled Nursing Facility  Patient/Family's Response to care:  Agreeable and Responding well to care.   Patient/Family's Understanding of and Emotional Response to Diagnosis, Current Treatment, and Prognosis: "The physical therapist at [facility] were great last time and he was able to transition back home." Patient defers to his spouse for understanding and support.   Emotional Assessment Appearance:  Appears stated age Attitude/Demeanor/Rapport:    Affect (typically observed):  Accepting Orientation:  Oriented to Self, Oriented to Place Alcohol / Substance use:  Not Applicable Psych involvement (Current and /or in the community):  No (Comment)  Discharge Needs  Concerns to be addressed:  Discharge Planning Concerns Readmission within the last 30 days:  No Current discharge risk:  Dependent with Mobility Barriers to Discharge:  Continued Medical Work up, Insurance Authorization    A , LCSW 08/15/2018, 2:31 PM  

## 2018-08-16 DIAGNOSIS — I83009 Varicose veins of unspecified lower extremity with ulcer of unspecified site: Secondary | ICD-10-CM

## 2018-08-16 DIAGNOSIS — L97909 Non-pressure chronic ulcer of unspecified part of unspecified lower leg with unspecified severity: Secondary | ICD-10-CM

## 2018-08-16 DIAGNOSIS — I872 Venous insufficiency (chronic) (peripheral): Secondary | ICD-10-CM

## 2018-08-16 DIAGNOSIS — L97822 Non-pressure chronic ulcer of other part of left lower leg with fat layer exposed: Secondary | ICD-10-CM

## 2018-08-16 LAB — GLUCOSE, CAPILLARY
GLUCOSE-CAPILLARY: 107 mg/dL — AB (ref 70–99)
GLUCOSE-CAPILLARY: 142 mg/dL — AB (ref 70–99)
Glucose-Capillary: 112 mg/dL — ABNORMAL HIGH (ref 70–99)

## 2018-08-16 LAB — BASIC METABOLIC PANEL
ANION GAP: 8 (ref 5–15)
BUN: 71 mg/dL — AB (ref 8–23)
CALCIUM: 8.2 mg/dL — AB (ref 8.9–10.3)
CO2: 24 mmol/L (ref 22–32)
Chloride: 106 mmol/L (ref 98–111)
Creatinine, Ser: 2.51 mg/dL — ABNORMAL HIGH (ref 0.61–1.24)
GFR calc Af Amer: 27 mL/min — ABNORMAL LOW (ref >60)
GFR, EST NON AFRICAN AMERICAN: 24 mL/min — AB (ref >60)
Glucose, Bld: 125 mg/dL — ABNORMAL HIGH (ref 70–99)
POTASSIUM: 4.9 mmol/L (ref 3.5–5.1)
SODIUM: 138 mmol/L (ref 135–145)

## 2018-08-16 MED ORDER — CEFAZOLIN SODIUM-DEXTROSE 2-4 GM/100ML-% IV SOLN
2.0000 g | Freq: Two times a day (BID) | INTRAVENOUS | Status: DC
  Administered 2018-08-16 – 2018-08-17 (×2): 2 g via INTRAVENOUS
  Filled 2018-08-16 (×2): qty 100

## 2018-08-16 MED ORDER — SODIUM CHLORIDE 0.9 % IV SOLN
INTRAVENOUS | Status: DC | PRN
  Administered 2018-08-16: 250 mL via INTRAVENOUS

## 2018-08-16 MED ORDER — ENOXAPARIN SODIUM 40 MG/0.4ML ~~LOC~~ SOLN
40.0000 mg | SUBCUTANEOUS | Status: DC
  Administered 2018-08-16 – 2018-08-19 (×4): 40 mg via SUBCUTANEOUS
  Filled 2018-08-16 (×4): qty 0.4

## 2018-08-16 MED ORDER — CEFAZOLIN SODIUM-DEXTROSE 2-4 GM/100ML-% IV SOLN: 2.0000 g | Freq: Three times a day (TID) | INTRAVENOUS | Status: DC

## 2018-08-16 NOTE — Progress Notes (Signed)
PROGRESS NOTE    [name]   QQI:[id number]  DOB: 1941/09/22  DOA: 08/13/2018 PCP: Kathyrn Lass, MD   Brief Narrative:  [name] is a 77 y.o. male with medical history significant of DM, venous stasis, HTN, HLD, stage 3 CKD. He was seen in the ED on 8/30 for b/l LE cellulitis and was discharged home with clindamycin.    Patient noted to have improvement in cellulitis on R however he states he had no significant improvement on L. Blood cultures noted to have 1/2 gm pos cocci and pt was instructed to return to ED for further work up.   Subjective: He states that he is having mild pain in his leg but overall has no other complaints.    Assessment & Plan:   Principal Problem:   Cellulitis of left leg- venous statis ulcers - has numerous ulcers on this leg and will need aggressive wound care in addition to the antibiotics - blood cultures showing gram + cocci in 1/2 sets in aerobic bottle-final culture result reveals ALLOIOCOCCUS OTITIS -Transition from vancomycin to Ancef - has had venous duplex of legs on 8/30 which was negative for DVT - wound care assisting with management and wound to be cleaned out again today - he will go to SNF for both wound care and debilitated state -Increase protein in diet to help with wound healing   Active Problems:   Type 2 diabetes mellitus with hyperlipidemia  - apparently taken off insulin as outpt- cont follow in [facility]- last A1c was 6.4 on 3/19 and now is 7.3- sugars stable in the hospital  Diabetic neuropathy - Gabapentin    Hyperlipidemia -Body mass index is 31.65 kg/m.  Chronic diastolic CHF/ HTN - grade 2 d CHF-   AKI - CKD3 -Creatinine has risen today from 1.63-2.5 point and therefore I have decided to hold Furosemide, Spironolactone, Cozaar and encouraged him to drink more fluid - Repeat creatinine tomorrow  Candida infection in groin - cont nystatin  HLD - Lipitor  DVT prophylaxis: Lovenox Code Status: Full  code Family Communication: wife Disposition Plan: SNF Consultants:     Procedures:     Antimicrobials:  Anti-infectives (From admission, onward)   Start     Dose/Rate Route Frequency Ordered Stop   08/15/18 0500  vancomycin (VANCOCIN) 1,750 mg in sodium chloride 0.9 % 500 mL IVPB  Status:  Discontinued     1,750 mg 250 mL/hr over 120 Minutes Intravenous Every 36 hours 08/13/18 1735 08/16/18 1025   08/13/18 1615  vancomycin (VANCOCIN) IVPB 1000 mg/200 mL premix  Status:  Discontinued     1,000 mg 200 mL/hr over 60 Minutes Intravenous  Once 08/13/18 1600 08/13/18 1602   08/13/18 1615  vancomycin (VANCOCIN) 2,500 mg in sodium chloride 0.9 % 500 mL IVPB     2,500 mg 250 mL/hr over 120 Minutes Intravenous  Once 08/13/18 1602 08/13/18 2019       Objective: Vitals:   08/15/18 1448 08/15/18 2137 08/16/18 0245 08/16/18 0430  BP: (!) 102/54 106/67  105/63  Pulse: 64 67  62  Resp: 17 17  18   Temp:  97.6 F (36.4 C)  97.8 F (36.6 C)  TempSrc:  Oral  Oral  SpO2: 98% 98%  97%  Weight:   111.8 kg   Height:        Intake/Output Summary (Last 24 hours) at 08/16/2018 1307 Last data filed at 08/16/2018 0430 Gross per 24 hour  Intake 860 ml  Output  575 ml  Net 285 ml   Filed Weights   08/13/18 1208 08/13/18 2300 08/16/18 0245  Weight: 125.6 kg 125.6 kg 111.8 kg    Examination: General exam: Appears comfortable  HEENT: PERRLA, oral mucosa moist, no sclera icterus or thrush Respiratory system: Clear to auscultation. Respiratory effort normal. Cardiovascular system: S1 & S2 heard, RRR.   Gastrointestinal system: Abdomen soft, non-tender, nondistended. Normal bowel sound. No organomegaly Central nervous system: Alert and oriented. No focal neurological deficits. Extremities: No cyanosis, clubbing - left leg mildy swollen today Skin: multiple b/l venous stasis ulcers on left leg- no significant drainage noted today Psychiatry:  Mood & affect appropriate.     Data Reviewed: I have  personally reviewed following labs and imaging studies  CBC: Recent Labs  Lab 08/09/18 1841 08/13/18 1350 08/14/18 0515  WBC 5.3 4.9 4.2  NEUTROABS 4.0 3.8  --   HGB 10.6* 10.6* 9.9*  HCT 32.8* 32.3* 30.6*  MCV 91.4 91.0 91.3  PLT 245 246 962   Basic Metabolic Panel: Recent Labs  Lab 08/09/18 1841 08/13/18 1350 08/14/18 0515 08/16/18 0518  NA 140 144 141 138  K 4.5 4.2 4.0 4.9  CL 109 108 109 106  CO2 22 24 24 24   GLUCOSE 140* 163* 137* 125*  BUN 48* 45* 42* 71*  CREATININE 1.79* 1.89* 1.63* 2.51*  CALCIUM 8.6* 9.0 8.5* 8.2*   GFR: Estimated Creatinine Clearance: 33.8 mL/min (A) (by C-G formula based on SCr of 2.51 mg/dL (H)). Liver Function Tests: Recent Labs  Lab 08/09/18 1841 08/14/18 0515  AST 14* 12*  ALT 11 10  ALKPHOS 95 87  BILITOT 0.5 0.8  PROT 7.1 6.5  ALBUMIN 3.0* 2.7*   No results for input(s): LIPASE, AMYLASE in the last 168 hours. No results for input(s): AMMONIA in the last 168 hours. Coagulation Profile: No results for input(s): INR, PROTIME in the last 168 hours. Cardiac Enzymes: No results for input(s): CKTOTAL, CKMB, CKMBINDEX, TROPONINI in the last 168 hours. BNP (last 3 results) No results for input(s): PROBNP in the last 8760 hours. HbA1C: Recent Labs    08/14/18 0515  HGBA1C 7.3*   CBG: Recent Labs  Lab 08/15/18 1154 08/15/18 1644 08/15/18 2228 08/16/18 0722 08/16/18 1121  GLUCAP 120* 129* 191* 107* 142*   Lipid Profile: No results for input(s): CHOL, HDL, LDLCALC, TRIG, CHOLHDL, LDLDIRECT in the last 72 hours. Thyroid Function Tests: No results for input(s): TSH, T4TOTAL, FREET4, T3FREE, THYROIDAB in the last 72 hours. Anemia Panel: No results for input(s): VITAMINB12, FOLATE, FERRITIN, TIBC, IRON, RETICCTPCT in the last 72 hours. Urine analysis:    Component Value Date/Time   COLORURINE YELLOW 02/18/2018 1212   APPEARANCEUR CLOUDY (A) 02/18/2018 1212   LABSPEC 1.015 02/18/2018 1212   PHURINE 5.0 02/18/2018 1212     GLUCOSEU 50 (A) 02/18/2018 1212   HGBUR LARGE (A) 02/18/2018 1212   BILIRUBINUR NEGATIVE 02/18/2018 1212   KETONESUR NEGATIVE 02/18/2018 1212   PROTEINUR 100 (A) 02/18/2018 1212   UROBILINOGEN 0.2 06/09/2011 0522   NITRITE NEGATIVE 02/18/2018 1212   LEUKOCYTESUR NEGATIVE 02/18/2018 1212   Sepsis Labs: @LABRCNTIP (procalcitonin:4,lacticidven:4) ) Recent Results (from the past 240 hour(s))  Blood Culture (routine x 2)     Status: Abnormal   Collection Time: 08/09/18  5:45 PM  Result Value Ref Range Status   Specimen Description   Final    BLOOD LEFT ARM Performed at [facility], [place] [address], [place], [place] [address]    Special  Requests   Final    BOTTLES DRAWN AEROBIC AND ANAEROBIC Blood Culture adequate volume Performed at [facility] [address] [place], [place] [address]    Culture  Setup Time   Final    GRAM POSITIVE COCCI AEROBIC BOTTLE ONLY CRITICAL RESULT CALLED TO, READ BACK BY AND VERIFIED WITH: Casandra Doffing RN [id number] 08/13/18 A BROWNING    Culture ALLOIOCOCCUS OTITIS (A)  Final   Report Status 08/15/2018 FINAL  Final  Blood Culture ID Panel (Reflexed)     Status: None   Collection Time: 08/09/18  5:45 PM  Result Value Ref Range Status   Enterococcus species NOT DETECTED NOT DETECTED Final   Listeria monocytogenes NOT DETECTED NOT DETECTED Final   Staphylococcus species NOT DETECTED NOT DETECTED Final   Staphylococcus aureus NOT DETECTED NOT DETECTED Final   Streptococcus species NOT DETECTED NOT DETECTED Final   Streptococcus agalactiae NOT DETECTED NOT DETECTED Final   Streptococcus pneumoniae NOT DETECTED NOT DETECTED Final   Streptococcus pyogenes NOT DETECTED NOT DETECTED Final   Acinetobacter baumannii NOT DETECTED NOT DETECTED Final   Enterobacteriaceae species NOT DETECTED NOT DETECTED Final   Enterobacter cloacae complex NOT DETECTED NOT DETECTED Final   Escherichia coli NOT DETECTED NOT DETECTED Final    Klebsiella oxytoca NOT DETECTED NOT DETECTED Final   Klebsiella pneumoniae NOT DETECTED NOT DETECTED Final   Proteus species NOT DETECTED NOT DETECTED Final   Serratia marcescens NOT DETECTED NOT DETECTED Final   Haemophilus influenzae NOT DETECTED NOT DETECTED Final   Neisseria meningitidis NOT DETECTED NOT DETECTED Final   Pseudomonas aeruginosa NOT DETECTED NOT DETECTED Final   Candida albicans NOT DETECTED NOT DETECTED Final   Candida glabrata NOT DETECTED NOT DETECTED Final   Candida krusei NOT DETECTED NOT DETECTED Final   Candida parapsilosis NOT DETECTED NOT DETECTED Final   Candida tropicalis NOT DETECTED NOT DETECTED Final    Comment: Performed at [facility], [address], [place], [place] [address]  Blood Culture (routine x 2)     Status: None   Collection Time: 08/09/18  6:04 PM  Result Value Ref Range Status   Specimen Description   Final    BLOOD RIGHT ARM Performed at [facility] [address], [place], [place] [address]    Special Requests   Final    BOTTLES DRAWN AEROBIC AND ANAEROBIC Blood Culture adequate volume Performed at [facility] [address], [place], [place] [address]    Culture   Final    NO GROWTH 5 DAYS Performed at [facility], [place] [address], [place], [place] [address]    Report Status 08/14/2018 FINAL  Final  Culture, blood (routine x 2)     Status: None (Preliminary result)   Collection Time: 08/13/18  4:15 PM  Result Value Ref Range Status   Specimen Description   Final    BLOOD RIGHT FOREARM Performed at [facility] [address] [place], [place] [address]    Special Requests   Final    BOTTLES DRAWN AEROBIC AND ANAEROBIC Blood Culture adequate volume Performed at [facility] [address], [place], [place] [address]    Culture   Final    NO GROWTH 2 DAYS Performed at [facility] [address], [place],  [address]     Report Status PENDING  Incomplete  Culture, blood (routine x 2)     Status: None (Preliminary  result)   Collection Time: 08/13/18  4:38 PM  Result Value Ref Range Status   Specimen Description   Final    BLOOD LEFT HAND Performed at [facility] [address], [place], [place] [address]    Special Requests   Final    BOTTLES DRAWN AEROBIC AND ANAEROBIC Blood Culture adequate volume Performed at [facility] [address] [place], [place] [address]    Culture   Final    NO GROWTH 2 DAYS Performed at [facility] [address], [place], [place] [address]    Report Status PENDING  Incomplete         Radiology Studies: No results found.    Scheduled Meds: . aspirin EC  81 mg Oral Daily  . atorvastatin  80 mg Oral Daily  . buPROPion  300 mg Oral Daily  . diclofenac sodium  2 g Topical QID  . enoxaparin (LOVENOX) injection  40 mg Subcutaneous Q24H  . escitalopram  10 mg Oral Daily  . gabapentin  400 mg Oral TID  . insulin aspart  0-15 Units Subcutaneous TID WC  . insulin aspart  0-5 Units Subcutaneous QHS  . neomycin-polymyxin b-dexamethasone  1 drop Both Eyes Q6H  . nutrition supplement (JUVEN)  1 packet Oral BID BM  . nystatin   Topical QID  . oxybutynin  5 mg Oral BID   Continuous Infusions:    LOS: 2 days    Time spent in minutes: 35    Debbe Odea, MD [facility] Pager: [email or web address] Password [email or web address] 08/16/2018, 1:07 PM

## 2018-08-16 NOTE — Progress Notes (Signed)
PHARMACY NOTE:  ANTIMICROBIAL RENAL DOSAGE ADJUSTMENT  Current antimicrobial regimen includes a mismatch between antimicrobial dosage and estimated renal function. As per policy approved by the Pharmacy & Therapeutics and Medical Executive Committees, the antimicrobial dosage will be adjusted accordingly.  Current antimicrobial and dosage:  Ancef 2 mg q8 hr  Indication: Cellulitis; unusual GPC in BCx, but likely a contaminant  Renal Function:   Estimated Creatinine Clearance: 33.8 mL/min (A) (by C-G formula based on SCr of 2.51 mg/dL (H)). []      On intermittent HD, scheduled: []      On CRRT    Antimicrobial dosage has been changed to:  Ancef 2g IV q12 hr   Additional Comments: patient is 111 kg - using 2g q8 hr as base dosing for cellulitis d/t obesity   Thank you for allowing pharmacy to be a part of this patient's care.  Reuel Boom, PharmD, BCPS [phone] 08/16/2018, 4:43 PM

## 2018-08-16 NOTE — Progress Notes (Signed)
Occupational Therapy Treatment Patient Details Name: [name] MRN: [id number] DOB: May 09, 1941 Today's Date: 08/16/2018    History of present illness Pt was admitted for LLE cellulitis.  PMH:  prostate CA, anxiety/depression, DM, and bil chronic stasis ulcers   OT comments  Pt making slow progress, but is limited by scrotal pain.  Pt did transfer sit to stand x2 today with mod assist but was unable to weight shift LEs to pivot to chair so lateral boost/scoot transfer performed being mindful of avoiding shearing.  Pt continues to need a great amount of help with all adls and transfers so continue to recommend SNF.   Follow Up Recommendations  SNF;Supervision/Assistance - 24 hour    Equipment Recommendations  None recommended by OT    Recommendations for Other Services      Precautions / Restrictions Precautions Precautions: Fall Precaution Comments: skin bil LEs and scrotom Restrictions Weight Bearing Restrictions: No       Mobility Bed Mobility Overal bed mobility: Needs Assistance Bed Mobility: Rolling;Supine to Sit Rolling: Min assist   Supine to sit: Min assist;+2 for physical assistance     General bed mobility comments: assist given to avoid shearing of skin and scrotum  Transfers Overall transfer level: Needs assistance Equipment used: Standard walker Transfers: Sit to/from Stand;Lateral/Scoot Transfers Sit to Stand: Mod assist;+2 physical assistance;From elevated surface        Lateral/Scoot Transfers: Max assist;+2 physical assistance;From elevated surface General transfer comment: Completed scoot transfer boosting pt with pad due to scrotal swelling. Pt also could transfer sit to stand with mod A x2 put could not weight shift LEs to shift feet.    Balance Overall balance assessment: Needs assistance Sitting-balance support: Feet supported Sitting balance-Leahy Scale: Good     Standing balance support: Bilateral upper extremity supported;During  functional activity Standing balance-Leahy Scale: Zero Standing balance comment: with walker, pt required mod assist to maintain standing.                            ADL either performed or assessed with clinical judgement   ADL Overall ADL's : Needs assistance/impaired Eating/Feeding: Minimal assistance;Sitting Eating/Feeding Details (indicate cue type and reason): min assist at times getting food to mouth.  If set up well, pt does not need physical assist.         Lower Body Bathing: Total assistance;Sit to/from stand Lower Body Bathing Details (indicate cue type and reason): came to stand x2.  Unable to pivot today.     Lower Body Dressing: Total assistance;+2 for physical assistance;Sit to/from stand Lower Body Dressing Details (indicate cue type and reason): able to come to standing with walker but unable to let go of walker to assist.  Wife usually dresses pt in bed.   Toilet Transfer Details (indicate cue type and reason): attempted to pivot. Pt came to stand with mod assist x2 but could not weight shift to move legs to pivot onto the commode. Toileting- Clothing Manipulation and Hygiene: Total assistance;+2 for physical assistance;Sitting/lateral lean     Tub/Shower Transfer Details (indicate cue type and reason): Pt sponge bathes Functional mobility during ADLs: Maximal assistance;+2 for physical assistance General ADL Comments: Pt limited with LE adls due to scrotal swelling and pain.     Vision   Vision Assessment?: No apparent visual deficits   Perception     Praxis      Cognition Arousal/Alertness: Awake/alert Behavior During Therapy: WFL for tasks assessed/performed Overall  Cognitive Status: Within Functional Limits for tasks assessed                                 General Comments: wife present during session         Exercises     Shoulder Instructions       General Comments Pt with wound to LLE and scrotal swelling with  thin skin.    Pertinent Vitals/ Pain       Pain Assessment: Faces Faces Pain Scale: Hurts even more Pain Location: scrotom when transitioning forward to EOB Pain Descriptors / Indicators: Sore;Discomfort Pain Intervention(s): Limited activity within patient's tolerance;Repositioned  Home Living                                          Prior Functioning/Environment              Frequency  Min 2X/week        Progress Toward Goals  OT Goals(current goals can now be found in the care plan section)  Progress towards OT goals: Progressing toward goals  Acute Rehab OT Goals Patient Stated Goal: get strength back OT Goal Formulation: With patient/family Time For Goal Achievement: 08/28/18 Potential to Achieve Goals: Good ADL Goals Pt Will Transfer to Toilet: with min assist;with +2 assist;bedside commode Additional ADL Goal #1: pt will perform rolling to bil sides at supervison level for adls Additional ADL Goal #2: pt will perform supine<>sit at min guard level in preparation for eating from EOB  Plan Discharge plan needs to be updated    Co-evaluation                 AM-PAC PT "6 Clicks" Daily Activity     Outcome Measure   Help from another person eating meals?: None Help from another person taking care of personal grooming?: A Little Help from another person toileting, which includes using toliet, bedpan, or urinal?: A Lot Help from another person bathing (including washing, rinsing, drying)?: A Lot Help from another person to put on and taking off regular upper body clothing?: A Little Help from another person to put on and taking off regular lower body clothing?: Total 6 Click Score: 15    End of Session Equipment Utilized During Treatment: Rolling walker  OT Visit Diagnosis: Muscle weakness (generalized) (M62.81)   Activity Tolerance Patient limited by fatigue   Patient Left in chair;with call bell/phone within reach;with chair  alarm set;with family/visitor present   Nurse Communication Mobility status;Need for lift equipment        Time: 1200-1226 OT Time Calculation (min): 26 min  Charges: OT General Charges $OT Visit: 1 Visit OT Treatments $Self Care/Home Management : 23-37 mins  Jinger Neighbors, OTR/L [phone]   Glenford Peers 08/16/2018, 12:36 PM

## 2018-08-16 NOTE — Progress Notes (Signed)
PT Cancellation Note  Patient Details Name: [name] MRN: [id number] DOB: [birth year]   Cancelled Treatment:     pt working with OT to get [facility].  Will check back later as schedule permits.  Pt plans to D/C to SNF Saturday   Rica Koyanagi  PTA [facility]  Acute  Rehab Pager      [phone]

## 2018-08-17 DIAGNOSIS — J029 Acute pharyngitis, unspecified: Secondary | ICD-10-CM

## 2018-08-17 DIAGNOSIS — R0600 Dyspnea, unspecified: Secondary | ICD-10-CM

## 2018-08-17 DIAGNOSIS — N179 Acute kidney failure, unspecified: Secondary | ICD-10-CM

## 2018-08-17 LAB — BASIC METABOLIC PANEL
ANION GAP: 7 (ref 5–15)
BUN: 73 mg/dL — ABNORMAL HIGH (ref 8–23)
CALCIUM: 8.2 mg/dL — AB (ref 8.9–10.3)
CO2: 23 mmol/L (ref 22–32)
Chloride: 107 mmol/L (ref 98–111)
Creatinine, Ser: 2.61 mg/dL — ABNORMAL HIGH (ref 0.61–1.24)
GFR, EST AFRICAN AMERICAN: 26 mL/min — AB (ref >60)
GFR, EST NON AFRICAN AMERICAN: 22 mL/min — AB (ref >60)
GLUCOSE: 129 mg/dL — AB (ref 70–99)
POTASSIUM: 4.6 mmol/L (ref 3.5–5.1)
Sodium: 137 mmol/L (ref 135–145)

## 2018-08-17 LAB — GLUCOSE, CAPILLARY
GLUCOSE-CAPILLARY: 107 mg/dL — AB (ref 70–99)
GLUCOSE-CAPILLARY: 108 mg/dL — AB (ref 70–99)
GLUCOSE-CAPILLARY: 86 mg/dL (ref 70–99)
Glucose-Capillary: 122 mg/dL — ABNORMAL HIGH (ref 70–99)
Glucose-Capillary: 157 mg/dL — ABNORMAL HIGH (ref 70–99)

## 2018-08-17 MED ORDER — SODIUM CHLORIDE 0.9 % IV BOLUS
500.0000 mL | Freq: Once | INTRAVENOUS | Status: AC
  Administered 2018-08-17: 1000 mL via INTRAVENOUS

## 2018-08-17 MED ORDER — MENTHOL 3 MG MT LOZG
1.0000 | LOZENGE | OROMUCOSAL | Status: DC | PRN
  Filled 2018-08-17: qty 9

## 2018-08-17 MED ORDER — VITAMIN C 500 MG PO TABS
500.0000 mg | ORAL_TABLET | Freq: Two times a day (BID) | ORAL | Status: DC
  Administered 2018-08-17 – 2018-08-20 (×7): 500 mg via ORAL
  Filled 2018-08-17 (×7): qty 1

## 2018-08-17 MED ORDER — SODIUM CHLORIDE 0.9 % IV SOLN
INTRAVENOUS | Status: AC
  Administered 2018-08-17 (×2): via INTRAVENOUS

## 2018-08-17 MED ORDER — CEPHALEXIN 500 MG PO CAPS
500.0000 mg | ORAL_CAPSULE | Freq: Three times a day (TID) | ORAL | Status: DC
  Administered 2018-08-17 – 2018-08-20 (×10): 500 mg via ORAL
  Filled 2018-08-17 (×10): qty 1

## 2018-08-17 MED ORDER — LORATADINE 10 MG PO TABS
10.0000 mg | ORAL_TABLET | Freq: Every day | ORAL | Status: DC
  Administered 2018-08-17 – 2018-08-20 (×4): 10 mg via ORAL
  Filled 2018-08-17 (×4): qty 1

## 2018-08-17 MED ORDER — ADULT MULTIVITAMIN W/MINERALS CH
1.0000 | ORAL_TABLET | Freq: Every day | ORAL | Status: DC
  Administered 2018-08-17 – 2018-08-20 (×4): 1 via ORAL
  Filled 2018-08-17 (×5): qty 1

## 2018-08-17 MED ORDER — SODIUM CHLORIDE 0.9 % IV SOLN
INTRAVENOUS | Status: DC
  Administered 2018-08-17: 10:00:00 via INTRAVENOUS

## 2018-08-17 NOTE — Plan of Care (Signed)
Pt stable with no needs. No changes needed to care plans.  

## 2018-08-17 NOTE — Progress Notes (Signed)
PROGRESS NOTE    [name]   WER:[id number]  DOB: May 11, 1941  DOA: 08/13/2018 PCP: Kathyrn Lass, MD   Brief Narrative:  [name] is a 77 y.o. male with medical history significant of DM, venous stasis, HTN, HLD, stage 3 CKD. He was seen in the ED on 8/30 for b/l LE cellulitis and was discharged home with clindamycin.    Patient noted to have improvement in cellulitis on R however he states he had no significant improvement on L. Blood cultures noted to have 1/2 gm pos cocci and pt was instructed to return to ED for further work up.   Subjective: He has a sore throat due to ongoing post nasal drip. This is a chronic issue and he has taken Claritin in the past which has helped. No fevers or cough.     Assessment & Plan:   Principal Problem:   Cellulitis of left leg- venous statis ulcers - has numerous ulcers on this leg and will need aggressive wound care in addition to the antibiotics - blood cultures showing gram + cocci in 1/2 sets in aerobic bottle-final culture result reveals ALLOIOCOCCUS OTITIS -Transition from vancomycin to Ancef- will change to Keflex today - has had venous duplex of legs on 8/30 which was negative for DVT - wound care assisting with management and wound to be cleaned out again today - he will go to SNF for both wound care and debilitated state -Increase protein in diet to help with wound healing- add Vit C and MVI for wound healing   Active Problems:   Type 2 diabetes mellitus with hyperlipidemia  - apparently taken off insulin as outpt- cont follow in SSI- last A1c was 6.4 on 3/19 and now is 7.3- sugars very stable in the hospital  PND, sore throat - start Claritin and Cepacol and follow- if no improvement, will consider Flonase  Diabetic neuropathy - Gabapentin    Hyperlipidemia -Body mass index is 31.65 kg/m.  Chronic diastolic CHF/ HTN - grade 2 d CHF- diuretics on hold  AKI - CKD3 -Creatinine has risen today from 1.63-2.51>>2.61  and therefore I held Furosemide, Spironolactone, Cozaar and encouraged him to drink more fluid - Repeat creatinine today slightly higher- will give IV fluids today and recheck tomorrow  Candida infection in groin - cont nystatin  HLD - Lipitor  DVT prophylaxis: Lovenox Code Status: Full code Family Communication: wife Disposition Plan: SNF Consultants:    none Procedures:    none Antimicrobials:  Anti-infectives (From admission, onward)   Start     Dose/Rate Route Frequency Ordered Stop   08/16/18 2200  ceFAZolin (ANCEF) IVPB 2g/100 mL premix  Status:  Discontinued     2 g 200 mL/hr over 30 Minutes Intravenous Every 8 hours 08/16/18 1429 08/16/18 1647   08/16/18 1800  ceFAZolin (ANCEF) IVPB 2g/100 mL premix     2 g 200 mL/hr over 30 Minutes Intravenous Every 12 hours 08/16/18 1647     08/15/18 0500  vancomycin (VANCOCIN) 1,750 mg in sodium chloride 0.9 % 500 mL IVPB  Status:  Discontinued     1,750 mg 250 mL/hr over 120 Minutes Intravenous Every 36 hours 08/13/18 1735 08/16/18 1025   08/13/18 1615  vancomycin (VANCOCIN) IVPB 1000 mg/200 mL premix  Status:  Discontinued     1,000 mg 200 mL/hr over 60 Minutes Intravenous  Once 08/13/18 1600 08/13/18 1602   08/13/18 1615  vancomycin (VANCOCIN) 2,500 mg in sodium chloride 0.9 % 500 mL IVPB  2,500 mg 250 mL/hr over 120 Minutes Intravenous  Once 08/13/18 1602 08/13/18 2019       Objective: Vitals:   08/16/18 0430 08/16/18 1337 08/17/18 0130 08/17/18 0511  BP: 105/63 105/68 111/70 114/67  Pulse: 62 64 61 (!) 58  Resp: 18 16 18 16   Temp: 97.8 F (36.6 C) 98.4 F (36.9 C) (!) 97.5 F (36.4 C) (!) 96.9 F (36.1 C)  TempSrc: Oral Oral Oral Axillary  SpO2: 97% 99% 94% 95%  Weight:      Height:        Intake/Output Summary (Last 24 hours) at 08/17/2018 1107 Last data filed at 08/17/2018 1000 Gross per 24 hour  Intake 2044.96 ml  Output 800 ml  Net 1244.96 ml   Filed Weights   08/13/18 1208 08/13/18 2300 08/16/18  0245  Weight: 125.6 kg 125.6 kg 111.8 kg    Examination: General exam: Appears comfortable  HEENT: PERRLA, oral mucosa moist, no sclera icterus or thrush Respiratory system: Clear to auscultation. Respiratory effort normal. Cardiovascular system: S1 & S2 heard, RRR.   Gastrointestinal system: Abdomen soft, non-tender, nondistended. Normal bowel sound. No organomegaly Central nervous system: Alert and oriented. No focal neurological deficits. Extremities: No cyanosis, clubbing - left leg swolling is improving Skin: multiple b/l venous stasis ulcers on left leg-  no significant drainage noted today Psychiatry:  Mood & affect appropriate.   Data Reviewed: I have personally reviewed following labs and imaging studies  CBC: Recent Labs  Lab 08/13/18 1350 08/14/18 0515  WBC 4.9 4.2  NEUTROABS 3.8  --   HGB 10.6* 9.9*  HCT 32.3* 30.6*  MCV 91.0 91.3  PLT 246 740   Basic Metabolic Panel: Recent Labs  Lab 08/13/18 1350 08/14/18 0515 08/16/18 0518 08/17/18 0409  NA 144 141 138 137  K 4.2 4.0 4.9 4.6  CL 108 109 106 107  CO2 24 24 24 23   GLUCOSE 163* 137* 125* 129*  BUN 45* 42* 71* 73*  CREATININE 1.89* 1.63* 2.51* 2.61*  CALCIUM 9.0 8.5* 8.2* 8.2*   GFR: Estimated Creatinine Clearance: 32.5 mL/min (A) (by C-G formula based on SCr of 2.61 mg/dL (H)). Liver Function Tests: Recent Labs  Lab 08/14/18 0515  AST 12*  ALT 10  ALKPHOS 87  BILITOT 0.8  PROT 6.5  ALBUMIN 2.7*   No results for input(s): LIPASE, AMYLASE in the last 168 hours. No results for input(s): AMMONIA in the last 168 hours. Coagulation Profile: No results for input(s): INR, PROTIME in the last 168 hours. Cardiac Enzymes: No results for input(s): CKTOTAL, CKMB, CKMBINDEX, TROPONINI in the last 168 hours. BNP (last 3 results) No results for input(s): PROBNP in the last 8760 hours. HbA1C: No results for input(s): HGBA1C in the last 72 hours. CBG: Recent Labs  Lab 08/16/18 0722 08/16/18 1121  08/16/18 1804 08/17/18 0027 08/17/18 0721  GLUCAP 107* 142* 112* 107* 108*   Lipid Profile: No results for input(s): CHOL, HDL, LDLCALC, TRIG, CHOLHDL, LDLDIRECT in the last 72 hours. Thyroid Function Tests: No results for input(s): TSH, T4TOTAL, FREET4, T3FREE, THYROIDAB in the last 72 hours. Anemia Panel: No results for input(s): VITAMINB12, FOLATE, FERRITIN, TIBC, IRON, RETICCTPCT in the last 72 hours. Urine analysis:    Component Value Date/Time   COLORURINE YELLOW 02/18/2018 1212   APPEARANCEUR CLOUDY (A) 02/18/2018 1212   LABSPEC 1.015 02/18/2018 1212   PHURINE 5.0 02/18/2018 1212   GLUCOSEU 50 (A) 02/18/2018 1212   HGBUR LARGE (A) 02/18/2018 1212   BILIRUBINUR NEGATIVE  02/18/2018 1212   [place] 02/18/2018 1212   PROTEINUR 100 (A) 02/18/2018 1212   UROBILINOGEN 0.2 06/09/2011 0522   NITRITE NEGATIVE 02/18/2018 1212   LEUKOCYTESUR NEGATIVE 02/18/2018 1212   Sepsis Labs: @LABRCNTIP (procalcitonin:4,lacticidven:4) ) Recent Results (from the past 240 hour(s))  Blood Culture (routine x 2)     Status: Abnormal   Collection Time: 08/09/18  5:45 PM  Result Value Ref Range Status   Specimen Description   Final    BLOOD LEFT ARM Performed at [facility], [place] [address] [place], [place] [address]    Special Requests   Final    BOTTLES DRAWN AEROBIC AND ANAEROBIC Blood Culture adequate volume Performed at [place] [address] [place], [place] [address]    Culture  Setup Time   Final    GRAM POSITIVE COCCI AEROBIC BOTTLE ONLY CRITICAL RESULT CALLED TO, READ BACK BY AND VERIFIED WITH: Casandra Doffing RN [id number] 08/13/18 A BROWNING    Culture ALLOIOCOCCUS OTITIS (A)  Final   Report Status 08/15/2018 FINAL  Final  Blood Culture ID Panel (Reflexed)     Status: None   Collection Time: 08/09/18  5:45 PM  Result Value Ref Range Status   Enterococcus species NOT DETECTED NOT DETECTED Final   Listeria monocytogenes NOT DETECTED NOT  DETECTED Final   Staphylococcus species NOT DETECTED NOT DETECTED Final   Staphylococcus aureus NOT DETECTED NOT DETECTED Final   Streptococcus species NOT DETECTED NOT DETECTED Final   Streptococcus agalactiae NOT DETECTED NOT DETECTED Final   Streptococcus pneumoniae NOT DETECTED NOT DETECTED Final   Streptococcus pyogenes NOT DETECTED NOT DETECTED Final   Acinetobacter baumannii NOT DETECTED NOT DETECTED Final   Enterobacteriaceae species NOT DETECTED NOT DETECTED Final   Enterobacter cloacae complex NOT DETECTED NOT DETECTED Final   Escherichia coli NOT DETECTED NOT DETECTED Final   Klebsiella oxytoca NOT DETECTED NOT DETECTED Final   Klebsiella pneumoniae NOT DETECTED NOT DETECTED Final   Proteus species NOT DETECTED NOT DETECTED Final   Serratia marcescens NOT DETECTED NOT DETECTED Final   Haemophilus influenzae NOT DETECTED NOT DETECTED Final   Neisseria meningitidis NOT DETECTED NOT DETECTED Final   Pseudomonas aeruginosa NOT DETECTED NOT DETECTED Final   Candida albicans NOT DETECTED NOT DETECTED Final   Candida glabrata NOT DETECTED NOT DETECTED Final   Candida krusei NOT DETECTED NOT DETECTED Final   Candida parapsilosis NOT DETECTED NOT DETECTED Final   Candida tropicalis NOT DETECTED NOT DETECTED Final    Comment: Performed at [facility], [address] [place], [place] [address]  Blood Culture (routine x 2)     Status: None   Collection Time: 08/09/18  6:04 PM  Result Value Ref Range Status   Specimen Description   Final    BLOOD RIGHT ARM Performed at [place] [place] [address], [place], [place] [address]    Special Requests   Final    BOTTLES DRAWN AEROBIC AND ANAEROBIC Blood Culture adequate volume Performed at [place] [address] [place], [place] [address]    Culture   Final    NO GROWTH 5 DAYS Performed at [facility], [place] [address] [place], [place] [address]    Report Status 08/14/2018 FINAL   Final  Culture, blood (routine x 2)     Status: None (Preliminary result)   Collection Time: 08/13/18  4:15 PM  Result Value Ref Range Status   Specimen Description  Final    BLOOD RIGHT FOREARM Performed at [place] [address] [place], [place] [address]    Special Requests   Final    BOTTLES DRAWN AEROBIC AND ANAEROBIC Blood Culture adequate volume Performed at [place] [address] [place], [place] [address]    Culture   Final    NO GROWTH 4 DAYS Performed at [facility], [place] [address] [place], [place] [address]    Report Status PENDING  Incomplete  Culture, blood (routine x 2)     Status: None (Preliminary result)   Collection Time: 08/13/18  4:38 PM  Result Value Ref Range Status   Specimen Description   Final    BLOOD LEFT HAND Performed at [place] [address], [place], [place] [address]    Special Requests   Final    BOTTLES DRAWN AEROBIC AND ANAEROBIC Blood Culture adequate volume Performed at [place] [address] [place], [place] [address]    Culture   Final    NO GROWTH 4 DAYS Performed at [facility], [place] [address] [place], [place] [address]    Report Status PENDING  Incomplete         Radiology Studies: No results found.    Scheduled Meds: . aspirin EC  81 mg Oral Daily  . atorvastatin  80 mg Oral Daily  . buPROPion  300 mg Oral Daily  . diclofenac sodium  2 g Topical QID  . enoxaparin (LOVENOX) injection  40 mg Subcutaneous Q24H  . escitalopram  10 mg Oral Daily  . gabapentin  400 mg Oral TID  . insulin aspart  0-15 Units Subcutaneous TID WC  . insulin aspart  0-5 Units Subcutaneous QHS  . loratadine  10 mg Oral Daily  . multivitamin with minerals  1 tablet Oral Daily  . neomycin-polymyxin b-dexamethasone  1 drop Both Eyes Q6H  . nutrition supplement (JUVEN)  1 packet Oral BID BM  . nystatin   Topical QID  .  oxybutynin  5 mg Oral BID  . vitamin C  500 mg Oral BID   Continuous Infusions: . sodium chloride 10 mL/hr at 08/17/18 0651  . sodium chloride 125 mL/hr at 08/17/18 1000  .  ceFAZolin (ANCEF) IV Stopped (08/17/18 0651)     LOS: 3 days    Time spent in minutes: 35    Debbe Odea, MD [facility] Pager: [email or web address] Password [email or web address] 08/17/2018, 11:07 AM

## 2018-08-18 ENCOUNTER — Inpatient Hospital Stay (HOSPITAL_COMMUNITY): Payer: Medicare Other

## 2018-08-18 LAB — C DIFFICILE QUICK SCREEN W PCR REFLEX
C DIFFICILE (CDIFF) INTERP: NOT DETECTED
C Diff antigen: NEGATIVE
C Diff toxin: NEGATIVE

## 2018-08-18 LAB — CULTURE, BLOOD (ROUTINE X 2)
Culture: NO GROWTH
Culture: NO GROWTH
SPECIAL REQUESTS: ADEQUATE
Special Requests: ADEQUATE

## 2018-08-18 LAB — BASIC METABOLIC PANEL
Anion gap: 7 (ref 5–15)
BUN: 80 mg/dL — AB (ref 8–23)
CALCIUM: 8.2 mg/dL — AB (ref 8.9–10.3)
CO2: 22 mmol/L (ref 22–32)
CREATININE: 2.85 mg/dL — AB (ref 0.61–1.24)
Chloride: 108 mmol/L (ref 98–111)
GFR calc Af Amer: 23 mL/min — ABNORMAL LOW (ref >60)
GFR, EST NON AFRICAN AMERICAN: 20 mL/min — AB (ref >60)
GLUCOSE: 107 mg/dL — AB (ref 70–99)
POTASSIUM: 4.7 mmol/L (ref 3.5–5.1)
SODIUM: 137 mmol/L (ref 135–145)

## 2018-08-18 LAB — GLUCOSE, CAPILLARY
GLUCOSE-CAPILLARY: 92 mg/dL (ref 70–99)
Glucose-Capillary: 127 mg/dL — ABNORMAL HIGH (ref 70–99)
Glucose-Capillary: 134 mg/dL — ABNORMAL HIGH (ref 70–99)
Glucose-Capillary: 131 mg/dL — ABNORMAL HIGH (ref 70–99)

## 2018-08-18 MED ORDER — SODIUM CHLORIDE 0.9 % IV SOLN
INTRAVENOUS | Status: DC
  Administered 2018-08-18 – 2018-08-20 (×4): via INTRAVENOUS

## 2018-08-18 NOTE — Progress Notes (Signed)
PROGRESS NOTE    [name]   QMG:[id number]  DOB: 12-13-1940  DOA: 08/13/2018 PCP: Kathyrn Lass, MD   Brief Narrative:  [name] is a 77 y.o. male with medical history significant of DM, venous stasis, HTN, HLD, stage 3 CKD. He was seen in the ED on 8/30 for b/l LE cellulitis and was discharged home with clindamycin.    Patient noted to have improvement in cellulitis on R however he states he had no significant improvement on L. Blood cultures noted to have 1/2 gm pos cocci and pt was instructed to return to ED for further work up.   Subjective: He feels he has had more loose BMs that he usually does. Last one was this AM. PND is not much improved yet.     Assessment & Plan:   Principal Problem:   Cellulitis of left leg- venous statis ulcers - has numerous ulcers on this leg and will need aggressive wound care in addition to the antibiotics - blood cultures showing gram + cocci in 1/2 sets in aerobic bottle-final culture result reveals ALLOIOCOCCUS OTITIS -Transition from vancomycin to Ancef-  changed to Keflex  On 9/7 - has had venous duplex of legs on 8/30 which was negative for DVT - wound care assisting with management and wound to be cleaned out again today - he will go to SNF for both wound care and debilitated state -Increase protein in diet to help with wound healing- add Vit C and MVI for wound healing   Active Problems: Diarrhea - increasing loose stools- check C diff today  AKI - CKD3 -Creatinine steadily rising from 1.63-2.51>>2.61 > 2.85 - ? Prerenal vs ATN  - Furosemide, Spironolactone, Cozaar on hold since 9/6- encouraged him to drink more fluid- giving IVF - continue steady IVF and follow closely- add continuous pulse ox to watch for fluid overload    Type 2 diabetes mellitus with hyperlipidemia  - apparently taken off insulin as outpt- cont follow in SSI- last A1c was 6.4 on 3/19 and now is 7.3- sugars very stable in the hospital  PND, sore  throat - start Claritin and Cepacol and follow- if no improvement, will consider Flonase  Diabetic neuropathy - Gabapentin    Hyperlipidemia -Body mass index is 31.65 kg/m.  Chronic diastolic CHF/ HTN - grade 2 d CHF- diuretics on hold as he appears dehydrated  Candida infection in groin - cont nystatin  HLD - Lipitor  DVT prophylaxis: Lovenox Code Status: Full code Family Communication: wife Disposition Plan: SNF when renal function improves Consultants:    none Procedures:    none Antimicrobials:  Anti-infectives (From admission, onward)   Start     Dose/Rate Route Frequency Ordered Stop   08/17/18 1400  cephALEXin (KEFLEX) capsule 500 mg     500 mg Oral Every 8 hours 08/17/18 1112     08/16/18 2200  ceFAZolin (ANCEF) IVPB 2g/100 mL premix  Status:  Discontinued     2 g 200 mL/hr over 30 Minutes Intravenous Every 8 hours 08/16/18 1429 08/16/18 1647   08/16/18 1800  ceFAZolin (ANCEF) IVPB 2g/100 mL premix  Status:  Discontinued     2 g 200 mL/hr over 30 Minutes Intravenous Every 12 hours 08/16/18 1647 08/17/18 1112   08/15/18 0500  vancomycin (VANCOCIN) 1,750 mg in sodium chloride 0.9 % 500 mL IVPB  Status:  Discontinued     1,750 mg 250 mL/hr over 120 Minutes Intravenous Every 36 hours 08/13/18 1735 08/16/18 1025  08/13/18 1615  vancomycin (VANCOCIN) IVPB 1000 mg/200 mL premix  Status:  Discontinued     1,000 mg 200 mL/hr over 60 Minutes Intravenous  Once 08/13/18 1600 08/13/18 1602   08/13/18 1615  vancomycin (VANCOCIN) 2,500 mg in sodium chloride 0.9 % 500 mL IVPB     2,500 mg 250 mL/hr over 120 Minutes Intravenous  Once 08/13/18 1602 08/13/18 2019       Objective: Vitals:   08/17/18 0511 08/17/18 1330 08/17/18 2143 08/18/18 0520  BP: 114/67 111/60 117/66 117/70  Pulse: (!) 58 67 61 (!) 55  Resp: 16 15 16 14   Temp: (!) 96.9 F (36.1 C) (!) 97.4 F (36.3 C) 97.9 F (36.6 C) (!) 97.3 F (36.3 C)  TempSrc: Axillary Axillary Oral Oral  SpO2: 95% 98%  97% 97%  Weight:      Height:        Intake/Output Summary (Last 24 hours) at 08/18/2018 0923 Last data filed at 08/18/2018 0600 Gross per 24 hour  Intake 4632.72 ml  Output 1700 ml  Net 2932.72 ml   Filed Weights   08/13/18 1208 08/13/18 2300 08/16/18 0245  Weight: 125.6 kg 125.6 kg 111.8 kg    Examination: General exam: Appears comfortable  HEENT: PERRLA, oral mucosa moist, no sclera icterus or thrush Respiratory system: Clear to auscultation. Respiratory effort normal. Cardiovascular system: S1 & S2 heard, RRR.   Gastrointestinal system: Abdomen soft, non-tender, nondistended. Normal bowel sound. No organomegaly Central nervous system: Alert and oriented. No focal neurological deficits. Extremities: No cyanosis, clubbing - left leg swolling is improving Skin: multiple b/l venous stasis ulcers on left leg- dressing opened again today- no significant drainage noted - wound are clean Psychiatry:  Mood & affect appropriate.   Data Reviewed: I have personally reviewed following labs and imaging studies  CBC: Recent Labs  Lab 08/13/18 1350 08/14/18 0515  WBC 4.9 4.2  NEUTROABS 3.8  --   HGB 10.6* 9.9*  HCT 32.3* 30.6*  MCV 91.0 91.3  PLT 246 353   Basic Metabolic Panel: Recent Labs  Lab 08/13/18 1350 08/14/18 0515 08/16/18 0518 08/17/18 0409 08/18/18 0348  NA 144 141 138 137 137  K 4.2 4.0 4.9 4.6 4.7  CL 108 109 106 107 108  CO2 24 24 24 23 22   GLUCOSE 163* 137* 125* 129* 107*  BUN 45* 42* 71* 73* 80*  CREATININE 1.89* 1.63* 2.51* 2.61* 2.85*  CALCIUM 9.0 8.5* 8.2* 8.2* 8.2*   GFR: Estimated Creatinine Clearance: 29.8 mL/min (A) (by C-G formula based on SCr of 2.85 mg/dL (H)). Liver Function Tests: Recent Labs  Lab 08/14/18 0515  AST 12*  ALT 10  ALKPHOS 87  BILITOT 0.8  PROT 6.5  ALBUMIN 2.7*   No results for input(s): LIPASE, AMYLASE in the last 168 hours. No results for input(s): AMMONIA in the last 168 hours. Coagulation Profile: No results for  input(s): INR, PROTIME in the last 168 hours. Cardiac Enzymes: No results for input(s): CKTOTAL, CKMB, CKMBINDEX, TROPONINI in the last 168 hours. BNP (last 3 results) No results for input(s): PROBNP in the last 8760 hours. HbA1C: No results for input(s): HGBA1C in the last 72 hours. CBG: Recent Labs  Lab 08/17/18 0721 08/17/18 1159 08/17/18 1630 08/17/18 2209 08/18/18 0722  GLUCAP 108* 122* 86 157* 92   Lipid Profile: No results for input(s): CHOL, HDL, LDLCALC, TRIG, CHOLHDL, LDLDIRECT in the last 72 hours. Thyroid Function Tests: No results for input(s): TSH, T4TOTAL, FREET4, T3FREE, THYROIDAB in the  last 72 hours. Anemia Panel: No results for input(s): VITAMINB12, FOLATE, FERRITIN, TIBC, IRON, RETICCTPCT in the last 72 hours. Urine analysis:    Component Value Date/Time   COLORURINE YELLOW 02/18/2018 1212   APPEARANCEUR CLOUDY (A) 02/18/2018 1212   LABSPEC 1.015 02/18/2018 1212   PHURINE 5.0 02/18/2018 1212   GLUCOSEU 50 (A) 02/18/2018 1212   HGBUR LARGE (A) 02/18/2018 1212   BILIRUBINUR NEGATIVE 02/18/2018 1212   KETONESUR NEGATIVE 02/18/2018 1212   PROTEINUR 100 (A) 02/18/2018 1212   UROBILINOGEN 0.2 06/09/2011 0522   NITRITE NEGATIVE 02/18/2018 1212   LEUKOCYTESUR NEGATIVE 02/18/2018 1212   Sepsis Labs: @LABRCNTIP (procalcitonin:4,lacticidven:4) ) Recent Results (from the past 240 hour(s))  Blood Culture (routine x 2)     Status: Abnormal   Collection Time: 08/09/18  5:45 PM  Result Value Ref Range Status   Specimen Description   Final    BLOOD LEFT ARM Performed at [facility], [place] [address] [place], [place] [address]    Special Requests   Final    BOTTLES DRAWN AEROBIC AND ANAEROBIC Blood Culture adequate volume Performed at [place] [address], [place], [place] [address]    Culture  Setup Time   Final    GRAM POSITIVE COCCI AEROBIC BOTTLE ONLY CRITICAL RESULT CALLED TO, READ BACK BY AND VERIFIED WITH:  Casandra Doffing RN [id number] 08/13/18 A BROWNING    Culture ALLOIOCOCCUS OTITIS (A)  Final   Report Status 08/15/2018 FINAL  Final  Blood Culture ID Panel (Reflexed)     Status: None   Collection Time: 08/09/18  5:45 PM  Result Value Ref Range Status   Enterococcus species NOT DETECTED NOT DETECTED Final   Listeria monocytogenes NOT DETECTED NOT DETECTED Final   Staphylococcus species NOT DETECTED NOT DETECTED Final   Staphylococcus aureus NOT DETECTED NOT DETECTED Final   Streptococcus species NOT DETECTED NOT DETECTED Final   Streptococcus agalactiae NOT DETECTED NOT DETECTED Final   Streptococcus pneumoniae NOT DETECTED NOT DETECTED Final   Streptococcus pyogenes NOT DETECTED NOT DETECTED Final   Acinetobacter baumannii NOT DETECTED NOT DETECTED Final   Enterobacteriaceae species NOT DETECTED NOT DETECTED Final   Enterobacter cloacae complex NOT DETECTED NOT DETECTED Final   Escherichia coli NOT DETECTED NOT DETECTED Final   Klebsiella oxytoca NOT DETECTED NOT DETECTED Final   Klebsiella pneumoniae NOT DETECTED NOT DETECTED Final   Proteus species NOT DETECTED NOT DETECTED Final   Serratia marcescens NOT DETECTED NOT DETECTED Final   Haemophilus influenzae NOT DETECTED NOT DETECTED Final   Neisseria meningitidis NOT DETECTED NOT DETECTED Final   Pseudomonas aeruginosa NOT DETECTED NOT DETECTED Final   Candida albicans NOT DETECTED NOT DETECTED Final   Candida glabrata NOT DETECTED NOT DETECTED Final   Candida krusei NOT DETECTED NOT DETECTED Final   Candida parapsilosis NOT DETECTED NOT DETECTED Final   Candida tropicalis NOT DETECTED NOT DETECTED Final    Comment: Performed at [facility], [address] [place], [place] [address]  Blood Culture (routine x 2)     Status: None   Collection Time: 08/09/18  6:04 PM  Result Value Ref Range Status   Specimen Description   Final    BLOOD RIGHT ARM Performed at [place] [facility] [address] [place] [address], [place], [place] [address]     Special Requests   Final    BOTTLES DRAWN AEROBIC AND ANAEROBIC Blood Culture adequate volume Performed at [place] [address] [place],  [place] [address]    Culture   Final    NO GROWTH 5 DAYS Performed at [facility], [place] [address] [place], [place] [address]    Report Status 08/14/2018 FINAL  Final  Culture, blood (routine x 2)     Status: None   Collection Time: 08/13/18  4:15 PM  Result Value Ref Range Status   Specimen Description   Final    BLOOD RIGHT FOREARM Performed at [place] [address], [place], [place] [address]    Special Requests   Final    BOTTLES DRAWN AEROBIC AND ANAEROBIC Blood Culture adequate volume Performed at [place] [address] [place] [address] [place], [place] [address]    Culture   Final    NO GROWTH 5 DAYS Performed at [facility], [place] [address], [place], [place] [address]    Report Status 08/18/2018 FINAL  Final  Culture, blood (routine x 2)     Status: None   Collection Time: 08/13/18  4:38 PM  Result Value Ref Range Status   Specimen Description   Final    BLOOD LEFT HAND Performed at [facility] [address], [place], [place] [address]    Special Requests   Final    BOTTLES DRAWN AEROBIC AND ANAEROBIC Blood Culture adequate volume Performed at [facility] [address], [place], [place] [address]    Culture   Final    NO GROWTH 5 DAYS Performed at [facility], [place] [address] [place], [place] [address]    Report Status 08/18/2018 FINAL  Final         Radiology Studies: No results found.    Scheduled Meds: . aspirin EC  81 mg Oral Daily  . atorvastatin  80 mg Oral Daily  . buPROPion  300 mg Oral Daily  . cephALEXin  500 mg Oral Q8H  . diclofenac sodium  2 g Topical QID  . enoxaparin (LOVENOX) injection  40 mg Subcutaneous Q24H  . escitalopram  10 mg Oral Daily  . gabapentin  400 mg Oral TID    . insulin aspart  0-15 Units Subcutaneous TID WC  . insulin aspart  0-5 Units Subcutaneous QHS  . loratadine  10 mg Oral Daily  . multivitamin with minerals  1 tablet Oral Daily  . neomycin-polymyxin b-dexamethasone  1 drop Both Eyes Q6H  . nutrition supplement (JUVEN)  1 packet Oral BID BM  . nystatin   Topical QID  . oxybutynin  5 mg Oral BID  . vitamin C  500 mg Oral BID   Continuous Infusions: . sodium chloride 10 mL/hr at 08/17/18 0651  . sodium chloride 125 mL/hr at 08/18/18 0838     LOS: 4 days    Time spent in minutes: 35    Debbe Odea, MD [facility] Pager: [email or web address] Password [facility] 08/18/2018, 9:23 AM

## 2018-08-19 LAB — GLUCOSE, CAPILLARY
GLUCOSE-CAPILLARY: 91 mg/dL (ref 70–99)
GLUCOSE-CAPILLARY: 92 mg/dL (ref 70–99)
Glucose-Capillary: 112 mg/dL — ABNORMAL HIGH (ref 70–99)

## 2018-08-19 LAB — BASIC METABOLIC PANEL
ANION GAP: 5 (ref 5–15)
BUN: 84 mg/dL — ABNORMAL HIGH (ref 8–23)
CHLORIDE: 113 mmol/L — AB (ref 98–111)
CO2: 20 mmol/L — ABNORMAL LOW (ref 22–32)
Calcium: 8.2 mg/dL — ABNORMAL LOW (ref 8.9–10.3)
Creatinine, Ser: 2.63 mg/dL — ABNORMAL HIGH (ref 0.61–1.24)
GFR calc non Af Amer: 22 mL/min — ABNORMAL LOW (ref >60)
GFR, EST AFRICAN AMERICAN: 26 mL/min — AB (ref >60)
GLUCOSE: 115 mg/dL — AB (ref 70–99)
Potassium: 4.6 mmol/L (ref 3.5–5.1)
Sodium: 138 mmol/L (ref 135–145)

## 2018-08-19 LAB — CBC
HEMATOCRIT: 32.1 % — AB (ref 39.0–52.0)
HEMOGLOBIN: 10.1 g/dL — AB (ref 13.0–17.0)
MCH: 29.2 pg (ref 26.0–34.0)
MCHC: 31.5 g/dL (ref 30.0–36.0)
MCV: 92.8 fL (ref 78.0–100.0)
Platelets: 233 10*3/uL (ref 150–400)
RBC: 3.46 MIL/uL — ABNORMAL LOW (ref 4.22–5.81)
RDW: 14.5 % (ref 11.5–15.5)
WBC: 5.2 10*3/uL (ref 4.0–10.5)

## 2018-08-19 NOTE — Progress Notes (Signed)
PROGRESS NOTE    [name]   UMP:[id number]  DOB: 1941-09-08  DOA: 08/13/2018 PCP: Kathyrn Lass, MD   Brief Narrative:  [name] is a 77 y.o. male with medical history significant of DM, venous stasis, HTN, HLD, stage 3 CKD. He was seen in the ED on 8/30 for b/l LE cellulitis and was discharged home with clindamycin.    Patient noted to have improvement in cellulitis on R however he states he had no significant improvement on L. Blood cultures noted to have 1/2 gm pos cocci and pt was instructed to return to ED for further work up.   Subjective: He has no complaints today.     Assessment & Plan:   Principal Problem:   Cellulitis of left leg- venous statis ulcers - has numerous ulcers on this leg and will need aggressive wound care in addition to the antibiotics - blood cultures showing gram + cocci in 1/2 sets in aerobic bottle-final culture result reveals ALLOIOCOCCUS OTITIS -Transition from vancomycin to Ancef-  changed to Keflex  On 9/7 - has had venous duplex of legs on 8/30 which was negative for DVT - wound care assisting with management - he will go to SNF for both wound care and debilitated state -Increase protein in diet to help with wound healing- added Vit C and MVI for wound healing   Active Problems: Diarrhea - increasing loose stools-   C diff negative  AKI - CKD3 -Creatinine steadily rising from 1.63-2.51>>2.61 > 2.85 - ? Prerenal vs ATN  - Furosemide, Spironolactone, Cozaar on hold since 9/6- encouraged him to drink more fluid- giving IVF - continue steady IVF and follow closely- added continuous pulse ox to watch for fluid overload - Cr improved today    Type 2 diabetes mellitus with hyperlipidemia  - apparently taken off insulin as outpt- cont follow in [facility]- last A1c was 6.4 on 3/19 and now is 7.3- sugars very stable in the hospital  PND, sore throat - start Claritin and Cepacol and follow- if no improvement, will consider Flonase  Diabetic  neuropathy - Gabapentin    Hyperlipidemia -Body mass index is 34.04 kg/m.  Chronic diastolic CHF/ HTN - grade 2 d CHF- diuretics on hold due to AKI   Candida infection in groin - cont nystatin  HLD - Lipitor  DVT prophylaxis: Lovenox Code Status: Full code Family Communication: wife Disposition Plan: SNF when renal function improves Consultants:    none Procedures:    none Antimicrobials:  Anti-infectives (From admission, onward)   Start     Dose/Rate Route Frequency Ordered Stop   08/17/18 1400  cephALEXin (KEFLEX) capsule 500 mg     500 mg Oral Every 8 hours 08/17/18 1112     08/16/18 2200  ceFAZolin (ANCEF) IVPB 2g/100 mL premix  Status:  Discontinued     2 g 200 mL/hr over 30 Minutes Intravenous Every 8 hours 08/16/18 1429 08/16/18 1647   08/16/18 1800  ceFAZolin (ANCEF) IVPB 2g/100 mL premix  Status:  Discontinued     2 g 200 mL/hr over 30 Minutes Intravenous Every 12 hours 08/16/18 1647 08/17/18 1112   08/15/18 0500  vancomycin (VANCOCIN) 1,750 mg in sodium chloride 0.9 % 500 mL IVPB  Status:  Discontinued     1,750 mg 250 mL/hr over 120 Minutes Intravenous Every 36 hours 08/13/18 1735 08/16/18 1025   08/13/18 1615  vancomycin (VANCOCIN) IVPB 1000 mg/200 mL premix  Status:  Discontinued     1,000 mg  200 mL/hr over 60 Minutes Intravenous  Once 08/13/18 1600 08/13/18 1602   08/13/18 1615  vancomycin (VANCOCIN) 2,500 mg in sodium chloride 0.9 % 500 mL IVPB     2,500 mg 250 mL/hr over 120 Minutes Intravenous  Once 08/13/18 1602 08/13/18 2019       Objective: Vitals:   08/18/18 1715 08/18/18 1824 08/18/18 2055 08/19/18 0543  BP: 126/63 134/77 117/60 125/69  Pulse: 67 66 71 63  Resp: 15 20 17 14   Temp: (!) 97.5 F (36.4 C) 99.8 F (37.7 C) 97.6 F (36.4 C) 98.5 F (36.9 C)  TempSrc: Oral Oral Oral Oral  SpO2: 97% 98% 98% 96%  Weight:  121.9 kg    Height:  6' 2.5" (1.892 m)      Intake/Output Summary (Last 24 hours) at 08/19/2018 1242 Last data filed  at 08/19/2018 1113 Gross per 24 hour  Intake 2480.39 ml  Output 1100 ml  Net 1380.39 ml   Filed Weights   08/13/18 2300 08/16/18 0245 08/18/18 1824  Weight: 125.6 kg 111.8 kg 121.9 kg    Examination: General exam: Appears comfortable  HEENT: PERRLA, oral mucosa moist, no sclera icterus or thrush Respiratory system: Clear to auscultation. Respiratory effort normal. Cardiovascular system: S1 & S2 heard, RRR.   Gastrointestinal system: Abdomen soft, non-tender, nondistended. Normal bowel sound. No organomegaly Central nervous system: Alert and oriented. No focal neurological deficits. Extremities: No cyanosis, clubbing - left leg swolling is improving Skin: multiple b/l venous stasis ulcers on left leg- dressing opened again today- no significant drainage noted - wound are clean Psychiatry:  Mood & affect appropriate.   Data Reviewed: I have personally reviewed following labs and imaging studies  CBC: Recent Labs  Lab 08/13/18 1350 08/14/18 0515 08/19/18 0443  WBC 4.9 4.2 5.2  NEUTROABS 3.8  --   --   HGB 10.6* 9.9* 10.1*  HCT 32.3* 30.6* 32.1*  MCV 91.0 91.3 92.8  PLT 246 227 494   Basic Metabolic Panel: Recent Labs  Lab 08/14/18 0515 08/16/18 0518 08/17/18 0409 08/18/18 0348 08/19/18 0443  NA 141 138 137 137 138  K 4.0 4.9 4.6 4.7 4.6  CL 109 106 107 108 113*  CO2 24 24 23 22  20*  GLUCOSE 137* 125* 129* 107* 115*  BUN 42* 71* 73* 80* 84*  CREATININE 1.63* 2.51* 2.61* 2.85* 2.63*  CALCIUM 8.5* 8.2* 8.2* 8.2* 8.2*   GFR: Estimated Creatinine Clearance: 33.9 mL/min (A) (by C-G formula based on SCr of 2.63 mg/dL (H)). Liver Function Tests: Recent Labs  Lab 08/14/18 0515  AST 12*  ALT 10  ALKPHOS 87  BILITOT 0.8  PROT 6.5  ALBUMIN 2.7*   No results for input(s): LIPASE, AMYLASE in the last 168 hours. No results for input(s): AMMONIA in the last 168 hours. Coagulation Profile: No results for input(s): INR, PROTIME in the last 168 hours. Cardiac  Enzymes: No results for input(s): CKTOTAL, CKMB, CKMBINDEX, TROPONINI in the last 168 hours. BNP (last 3 results) No results for input(s): PROBNP in the last 8760 hours. HbA1C: No results for input(s): HGBA1C in the last 72 hours. CBG: Recent Labs  Lab 08/18/18 0722 08/18/18 1143 08/18/18 1651 08/18/18 2057 08/19/18 0730  GLUCAP 92 127* 134* 131* 91   Lipid Profile: No results for input(s): CHOL, HDL, LDLCALC, TRIG, CHOLHDL, LDLDIRECT in the last 72 hours. Thyroid Function Tests: No results for input(s): TSH, T4TOTAL, FREET4, T3FREE, THYROIDAB in the last 72 hours. Anemia Panel: No results for input(s): VITAMINB12,  FOLATE, FERRITIN, TIBC, IRON, RETICCTPCT in the last 72 hours. Urine analysis:    Component Value Date/Time   COLORURINE YELLOW 02/18/2018 1212   APPEARANCEUR CLOUDY (A) 02/18/2018 1212   LABSPEC 1.015 02/18/2018 1212   PHURINE 5.0 02/18/2018 1212   GLUCOSEU 50 (A) 02/18/2018 1212   HGBUR LARGE (A) 02/18/2018 1212   BILIRUBINUR NEGATIVE 02/18/2018 1212   KETONESUR NEGATIVE 02/18/2018 1212   PROTEINUR 100 (A) 02/18/2018 1212   UROBILINOGEN 0.2 06/09/2011 0522   NITRITE NEGATIVE 02/18/2018 1212   LEUKOCYTESUR NEGATIVE 02/18/2018 1212   Sepsis Labs: @LABRCNTIP (procalcitonin:4,lacticidven:4) ) Recent Results (from the past 240 hour(s))  Blood Culture (routine x 2)     Status: Abnormal   Collection Time: 08/09/18  5:45 PM  Result Value Ref Range Status   Specimen Description   Final    BLOOD LEFT ARM Performed at [facility], [place] [address], [place], [place] [address]    Special Requests   Final    BOTTLES DRAWN AEROBIC AND ANAEROBIC Blood Culture adequate volume Performed at [place] [address] [place], [place] [address]    Culture  Setup Time   Final    GRAM POSITIVE COCCI AEROBIC BOTTLE ONLY CRITICAL RESULT CALLED TO, READ BACK BY AND VERIFIED WITH: Casandra Doffing RN [id number] 08/13/18 A BROWNING    Culture  ALLOIOCOCCUS OTITIS (A)  Final   Report Status 08/15/2018 FINAL  Final  Blood Culture ID Panel (Reflexed)     Status: None   Collection Time: 08/09/18  5:45 PM  Result Value Ref Range Status   Enterococcus species NOT DETECTED NOT DETECTED Final   Listeria monocytogenes NOT DETECTED NOT DETECTED Final   Staphylococcus species NOT DETECTED NOT DETECTED Final   Staphylococcus aureus NOT DETECTED NOT DETECTED Final   Streptococcus species NOT DETECTED NOT DETECTED Final   Streptococcus agalactiae NOT DETECTED NOT DETECTED Final   Streptococcus pneumoniae NOT DETECTED NOT DETECTED Final   Streptococcus pyogenes NOT DETECTED NOT DETECTED Final   Acinetobacter baumannii NOT DETECTED NOT DETECTED Final   Enterobacteriaceae species NOT DETECTED NOT DETECTED Final   Enterobacter cloacae complex NOT DETECTED NOT DETECTED Final   Escherichia coli NOT DETECTED NOT DETECTED Final   Klebsiella oxytoca NOT DETECTED NOT DETECTED Final   Klebsiella pneumoniae NOT DETECTED NOT DETECTED Final   Proteus species NOT DETECTED NOT DETECTED Final   Serratia marcescens NOT DETECTED NOT DETECTED Final   Haemophilus influenzae NOT DETECTED NOT DETECTED Final   Neisseria meningitidis NOT DETECTED NOT DETECTED Final   Pseudomonas aeruginosa NOT DETECTED NOT DETECTED Final   Candida albicans NOT DETECTED NOT DETECTED Final   Candida glabrata NOT DETECTED NOT DETECTED Final   Candida krusei NOT DETECTED NOT DETECTED Final   Candida parapsilosis NOT DETECTED NOT DETECTED Final   Candida tropicalis NOT DETECTED NOT DETECTED Final    Comment: Performed at [facility], [address] [place], [place] [address]  Blood Culture (routine x 2)     Status: None   Collection Time: 08/09/18  6:04 PM  Result Value Ref Range Status   Specimen Description   Final    BLOOD RIGHT ARM Performed at [place] [address], [place], [place] [address]    Special Requests   Final    BOTTLES DRAWN  AEROBIC AND ANAEROBIC Blood Culture adequate volume Performed at [place] [address] [place], [place] [address]    Culture   Final  NO GROWTH 5 DAYS Performed at [facility], [place] [address] [place], [place] [address]    Report Status 08/14/2018 FINAL  Final  Culture, blood (routine x 2)     Status: None   Collection Time: 08/13/18  4:15 PM  Result Value Ref Range Status   Specimen Description   Final    BLOOD RIGHT FOREARM Performed at [place] [address], [place], [place] [address]    Special Requests   Final    BOTTLES DRAWN AEROBIC AND ANAEROBIC Blood Culture adequate volume Performed at [place] [address] [place], [place] [address]    Culture   Final    NO GROWTH 5 DAYS Performed at [facility], [place] [address] [place], [place] [address]    Report Status 08/18/2018 FINAL  Final  Culture, blood (routine x 2)     Status: None   Collection Time: 08/13/18  4:38 PM  Result Value Ref Range Status   Specimen Description   Final    BLOOD LEFT HAND Performed at [place] [address], [place], [place] [address]    Special Requests   Final    BOTTLES DRAWN AEROBIC AND ANAEROBIC Blood Culture adequate volume Performed at [place] [address] [place], [place] [address]    Culture   Final    NO GROWTH 5 DAYS Performed at [facility], [place] [address] [place], [place] [address]    Report Status 08/18/2018 FINAL  Final  C difficile quick scan w PCR reflex     Status: None   Collection Time: 08/18/18  4:40 PM  Result Value Ref Range Status   C Diff antigen NEGATIVE NEGATIVE Final   C Diff toxin NEGATIVE NEGATIVE Final   C Diff interpretation No C. difficile detected.  Final    Comment: Performed at [facility], [place] [address], [place], [place] [address]         Radiology Studies: Dg Chest Port 1  View  Result Date: 08/18/2018 CLINICAL DATA:  Cough and congestion.  Cellulitis. EXAM: PORTABLE CHEST 1 VIEW COMPARISON:  02/18/2018 FINDINGS: Enlarged cardiac silhouette. Calcific atherosclerotic disease of the aorta. Prominence of the interstitium. Probable right pleural effusion. Opacity of the right cardiophrenic angle may represent airspace consolidation, atelectasis or potentially a pulmonary nodule. Osseous structures are without acute abnormality. Soft tissues are grossly normal. IMPRESSION: Enlarged cardiac silhouette with interstitial pulmonary edema. Persistent right pleural effusion. Opacity of the right cardiophrenic angle may represent airspace consolidation, atelectasis or potentially a pulmonary nodule. Electronically Signed   By: Fidela Salisbury M.D.   On: 08/18/2018 14:11      Scheduled Meds: . aspirin EC  81 mg Oral Daily  . atorvastatin  80 mg Oral Daily  . buPROPion  300 mg Oral Daily  . cephALEXin  500 mg Oral Q8H  . diclofenac sodium  2 g Topical QID  . enoxaparin (LOVENOX) injection  40 mg Subcutaneous Q24H  . escitalopram  10 mg Oral Daily  . gabapentin  400 mg Oral TID  . insulin aspart  0-15 Units Subcutaneous TID WC  . insulin aspart  0-5 Units Subcutaneous QHS  . loratadine  10 mg Oral Daily  . multivitamin with minerals  1 tablet Oral Daily  . neomycin-polymyxin b-dexamethasone  1 drop Both Eyes Q6H  . nutrition supplement (JUVEN)  1 packet Oral BID BM  . nystatin  Topical QID  . oxybutynin  5 mg Oral BID  . vitamin C  500 mg Oral BID   Continuous Infusions: . sodium chloride 10 mL/hr at 08/17/18 0651  . sodium chloride 125 mL/hr at 08/19/18 1056     LOS: 5 days    Time spent in minutes: 35    Debbe Odea, MD [facility] Pager: [email or web address] Password [email or web address] 08/19/2018, 12:42 PM

## 2018-08-19 NOTE — Progress Notes (Signed)
PT Cancellation Note  Patient Details Name: ARDA KEADLE MRN: [id number] DOB: November 26, 1941   Cancelled Treatment:    Reason Eval/Treat Not Completed: Other (comment), patient with nsg care. Check back another time.   Claretha Cooper 08/19/2018, 10:48 AM Tresa Endo PT [phone]

## 2018-08-19 NOTE — Care Management Important Message (Signed)
Important Message  Patient Details  Name: LADANIAN KELTER MRN: [id number] Date of Birth: Jan 27, 1941   Medicare Important Message Given:  Yes    Kerin Salen 08/19/2018, 11:06 AMImportant Message  Patient Details  Name: AARIT KASHUBA MRN: [id number] Date of Birth: [birth year]   Medicare Important Message Given:  Yes    Kerin Salen 08/19/2018, 11:06 AM

## 2018-08-20 DIAGNOSIS — R41841 Cognitive communication deficit: Secondary | ICD-10-CM | POA: Diagnosis not present

## 2018-08-20 DIAGNOSIS — Z8619 Personal history of other infectious and parasitic diseases: Secondary | ICD-10-CM | POA: Diagnosis not present

## 2018-08-20 DIAGNOSIS — I10 Essential (primary) hypertension: Secondary | ICD-10-CM | POA: Diagnosis not present

## 2018-08-20 DIAGNOSIS — N189 Chronic kidney disease, unspecified: Secondary | ICD-10-CM | POA: Diagnosis not present

## 2018-08-20 DIAGNOSIS — S3992XA Unspecified injury of lower back, initial encounter: Secondary | ICD-10-CM | POA: Diagnosis not present

## 2018-08-20 DIAGNOSIS — I878 Other specified disorders of veins: Secondary | ICD-10-CM | POA: Diagnosis present

## 2018-08-20 DIAGNOSIS — N183 Chronic kidney disease, stage 3 unspecified: Secondary | ICD-10-CM

## 2018-08-20 DIAGNOSIS — Z8701 Personal history of pneumonia (recurrent): Secondary | ICD-10-CM | POA: Diagnosis not present

## 2018-08-20 DIAGNOSIS — J189 Pneumonia, unspecified organism: Secondary | ICD-10-CM | POA: Diagnosis not present

## 2018-08-20 DIAGNOSIS — R54 Age-related physical debility: Secondary | ICD-10-CM | POA: Diagnosis not present

## 2018-08-20 DIAGNOSIS — Z7982 Long term (current) use of aspirin: Secondary | ICD-10-CM | POA: Diagnosis not present

## 2018-08-20 DIAGNOSIS — E1122 Type 2 diabetes mellitus with diabetic chronic kidney disease: Secondary | ICD-10-CM | POA: Diagnosis present

## 2018-08-20 DIAGNOSIS — L8990 Pressure ulcer of unspecified site, unspecified stage: Secondary | ICD-10-CM | POA: Diagnosis not present

## 2018-08-20 DIAGNOSIS — E1169 Type 2 diabetes mellitus with other specified complication: Secondary | ICD-10-CM | POA: Diagnosis present

## 2018-08-20 DIAGNOSIS — L259 Unspecified contact dermatitis, unspecified cause: Secondary | ICD-10-CM | POA: Diagnosis not present

## 2018-08-20 DIAGNOSIS — D649 Anemia, unspecified: Secondary | ICD-10-CM | POA: Diagnosis not present

## 2018-08-20 DIAGNOSIS — L03115 Cellulitis of right lower limb: Secondary | ICD-10-CM | POA: Diagnosis present

## 2018-08-20 DIAGNOSIS — R498 Other voice and resonance disorders: Secondary | ICD-10-CM | POA: Diagnosis not present

## 2018-08-20 DIAGNOSIS — S299XXA Unspecified injury of thorax, initial encounter: Secondary | ICD-10-CM | POA: Diagnosis not present

## 2018-08-20 DIAGNOSIS — R110 Nausea: Secondary | ICD-10-CM | POA: Diagnosis not present

## 2018-08-20 DIAGNOSIS — I129 Hypertensive chronic kidney disease with stage 1 through stage 4 chronic kidney disease, or unspecified chronic kidney disease: Secondary | ICD-10-CM | POA: Diagnosis not present

## 2018-08-20 DIAGNOSIS — Z7401 Bed confinement status: Secondary | ICD-10-CM | POA: Diagnosis not present

## 2018-08-20 DIAGNOSIS — Z923 Personal history of irradiation: Secondary | ICD-10-CM | POA: Diagnosis not present

## 2018-08-20 DIAGNOSIS — I130 Hypertensive heart and chronic kidney disease with heart failure and stage 1 through stage 4 chronic kidney disease, or unspecified chronic kidney disease: Secondary | ICD-10-CM | POA: Diagnosis present

## 2018-08-20 DIAGNOSIS — M5489 Other dorsalgia: Secondary | ICD-10-CM | POA: Diagnosis not present

## 2018-08-20 DIAGNOSIS — R05 Cough: Secondary | ICD-10-CM | POA: Diagnosis not present

## 2018-08-20 DIAGNOSIS — I509 Heart failure, unspecified: Secondary | ICD-10-CM | POA: Diagnosis not present

## 2018-08-20 DIAGNOSIS — E785 Hyperlipidemia, unspecified: Secondary | ICD-10-CM | POA: Diagnosis present

## 2018-08-20 DIAGNOSIS — R2689 Other abnormalities of gait and mobility: Secondary | ICD-10-CM | POA: Diagnosis not present

## 2018-08-20 DIAGNOSIS — F419 Anxiety disorder, unspecified: Secondary | ICD-10-CM | POA: Diagnosis not present

## 2018-08-20 DIAGNOSIS — N179 Acute kidney failure, unspecified: Secondary | ICD-10-CM | POA: Diagnosis present

## 2018-08-20 DIAGNOSIS — I5032 Chronic diastolic (congestive) heart failure: Secondary | ICD-10-CM | POA: Diagnosis present

## 2018-08-20 DIAGNOSIS — R634 Abnormal weight loss: Secondary | ICD-10-CM | POA: Diagnosis not present

## 2018-08-20 DIAGNOSIS — R531 Weakness: Secondary | ICD-10-CM | POA: Diagnosis not present

## 2018-08-20 DIAGNOSIS — F329 Major depressive disorder, single episode, unspecified: Secondary | ICD-10-CM | POA: Diagnosis not present

## 2018-08-20 DIAGNOSIS — Z8546 Personal history of malignant neoplasm of prostate: Secondary | ICD-10-CM | POA: Diagnosis not present

## 2018-08-20 DIAGNOSIS — J22 Unspecified acute lower respiratory infection: Secondary | ICD-10-CM | POA: Diagnosis not present

## 2018-08-20 DIAGNOSIS — I959 Hypotension, unspecified: Secondary | ICD-10-CM | POA: Diagnosis present

## 2018-08-20 DIAGNOSIS — I83009 Varicose veins of unspecified lower extremity with ulcer of unspecified site: Secondary | ICD-10-CM | POA: Diagnosis not present

## 2018-08-20 DIAGNOSIS — I83029 Varicose veins of left lower extremity with ulcer of unspecified site: Secondary | ICD-10-CM | POA: Diagnosis not present

## 2018-08-20 DIAGNOSIS — L97221 Non-pressure chronic ulcer of left calf limited to breakdown of skin: Secondary | ICD-10-CM | POA: Diagnosis not present

## 2018-08-20 DIAGNOSIS — N3000 Acute cystitis without hematuria: Secondary | ICD-10-CM | POA: Diagnosis present

## 2018-08-20 DIAGNOSIS — L97909 Non-pressure chronic ulcer of unspecified part of unspecified lower leg with unspecified severity: Secondary | ICD-10-CM | POA: Diagnosis not present

## 2018-08-20 DIAGNOSIS — Z8249 Family history of ischemic heart disease and other diseases of the circulatory system: Secondary | ICD-10-CM | POA: Diagnosis not present

## 2018-08-20 DIAGNOSIS — R509 Fever, unspecified: Secondary | ICD-10-CM | POA: Diagnosis not present

## 2018-08-20 DIAGNOSIS — M6281 Muscle weakness (generalized): Secondary | ICD-10-CM | POA: Diagnosis not present

## 2018-08-20 DIAGNOSIS — R410 Disorientation, unspecified: Secondary | ICD-10-CM | POA: Diagnosis not present

## 2018-08-20 DIAGNOSIS — L0390 Cellulitis, unspecified: Secondary | ICD-10-CM | POA: Diagnosis not present

## 2018-08-20 DIAGNOSIS — N5089 Other specified disorders of the male genital organs: Secondary | ICD-10-CM | POA: Diagnosis not present

## 2018-08-20 DIAGNOSIS — R300 Dysuria: Secondary | ICD-10-CM | POA: Diagnosis not present

## 2018-08-20 DIAGNOSIS — R1312 Dysphagia, oropharyngeal phase: Secondary | ICD-10-CM | POA: Diagnosis not present

## 2018-08-20 DIAGNOSIS — I872 Venous insufficiency (chronic) (peripheral): Secondary | ICD-10-CM | POA: Diagnosis present

## 2018-08-20 DIAGNOSIS — G9341 Metabolic encephalopathy: Secondary | ICD-10-CM | POA: Diagnosis present

## 2018-08-20 DIAGNOSIS — E669 Obesity, unspecified: Secondary | ICD-10-CM | POA: Diagnosis not present

## 2018-08-20 DIAGNOSIS — M545 Low back pain: Secondary | ICD-10-CM | POA: Diagnosis present

## 2018-08-20 DIAGNOSIS — R0602 Shortness of breath: Secondary | ICD-10-CM | POA: Diagnosis not present

## 2018-08-20 DIAGNOSIS — R4182 Altered mental status, unspecified: Secondary | ICD-10-CM | POA: Diagnosis not present

## 2018-08-20 DIAGNOSIS — Z23 Encounter for immunization: Secondary | ICD-10-CM | POA: Diagnosis not present

## 2018-08-20 DIAGNOSIS — R790 Abnormal level of blood mineral: Secondary | ICD-10-CM | POA: Diagnosis not present

## 2018-08-20 DIAGNOSIS — G9340 Encephalopathy, unspecified: Secondary | ICD-10-CM | POA: Diagnosis not present

## 2018-08-20 DIAGNOSIS — C61 Malignant neoplasm of prostate: Secondary | ICD-10-CM | POA: Diagnosis not present

## 2018-08-20 DIAGNOSIS — N342 Other urethritis: Secondary | ICD-10-CM | POA: Diagnosis not present

## 2018-08-20 DIAGNOSIS — N390 Urinary tract infection, site not specified: Secondary | ICD-10-CM | POA: Diagnosis not present

## 2018-08-20 DIAGNOSIS — M2550 Pain in unspecified joint: Secondary | ICD-10-CM | POA: Diagnosis not present

## 2018-08-20 DIAGNOSIS — E860 Dehydration: Secondary | ICD-10-CM | POA: Diagnosis present

## 2018-08-20 DIAGNOSIS — I83028 Varicose veins of left lower extremity with ulcer other part of lower leg: Secondary | ICD-10-CM | POA: Diagnosis not present

## 2018-08-20 DIAGNOSIS — L03116 Cellulitis of left lower limb: Secondary | ICD-10-CM | POA: Diagnosis present

## 2018-08-20 DIAGNOSIS — E876 Hypokalemia: Secondary | ICD-10-CM | POA: Diagnosis present

## 2018-08-20 DIAGNOSIS — E119 Type 2 diabetes mellitus without complications: Secondary | ICD-10-CM | POA: Diagnosis not present

## 2018-08-20 LAB — BASIC METABOLIC PANEL
Anion gap: 8 (ref 5–15)
BUN: 73 mg/dL — AB (ref 8–23)
CHLORIDE: 112 mmol/L — AB (ref 98–111)
CO2: 19 mmol/L — AB (ref 22–32)
Calcium: 8.3 mg/dL — ABNORMAL LOW (ref 8.9–10.3)
Creatinine, Ser: 2.14 mg/dL — ABNORMAL HIGH (ref 0.61–1.24)
GFR calc Af Amer: 33 mL/min — ABNORMAL LOW (ref >60)
GFR calc non Af Amer: 29 mL/min — ABNORMAL LOW (ref >60)
GLUCOSE: 103 mg/dL — AB (ref 70–99)
POTASSIUM: 4.3 mmol/L (ref 3.5–5.1)
Sodium: 139 mmol/L (ref 135–145)

## 2018-08-20 LAB — GLUCOSE, CAPILLARY
GLUCOSE-CAPILLARY: 140 mg/dL — AB (ref 70–99)
Glucose-Capillary: 127 mg/dL — ABNORMAL HIGH (ref 70–99)
Glucose-Capillary: 91 mg/dL (ref 70–99)

## 2018-08-20 MED ORDER — JUVEN PO PACK: 1.0000 | PACK | Freq: Two times a day (BID) | ORAL | 0 refills | Status: AC

## 2018-08-20 MED ORDER — DICLOFENAC SODIUM 1 % TD GEL: 2.0000 g | Freq: Four times a day (QID) | TRANSDERMAL | Status: AC

## 2018-08-20 MED ORDER — ADULT MULTIVITAMIN W/MINERALS CH: 1.0000 | ORAL_TABLET | Freq: Every day | ORAL | Status: AC

## 2018-08-20 MED ORDER — MENTHOL 3 MG MT LOZG: 1.0000 | LOZENGE | OROMUCOSAL | 12 refills | Status: AC | PRN

## 2018-08-20 MED ORDER — TRAMADOL HCL 50 MG PO TABS: 100.0000 mg | ORAL_TABLET | Freq: Four times a day (QID) | ORAL | Status: DC | PRN

## 2018-08-20 MED ORDER — ASCORBIC ACID 500 MG PO TABS: 500.0000 mg | ORAL_TABLET | Freq: Two times a day (BID) | ORAL | Status: AC

## 2018-08-20 MED ORDER — LORATADINE 10 MG PO TABS: 10.0000 mg | ORAL_TABLET | Freq: Every day | ORAL | Status: DC

## 2018-08-20 MED ORDER — TRAMADOL HCL 50 MG PO TABS: 50.0000 mg | ORAL_TABLET | Freq: Four times a day (QID) | ORAL | Status: DC | PRN

## 2018-08-20 MED ORDER — TRAMADOL HCL 50 MG PO TABS: 100.0000 mg | ORAL_TABLET | Freq: Four times a day (QID) | ORAL | 0 refills | Status: DC | PRN

## 2018-08-20 MED ORDER — NEOMYCIN-POLYMYXIN-DEXAMETH 3.5-10000-0.1 OP SUSP: 1.0000 [drp] | Freq: Four times a day (QID) | OPHTHALMIC | Status: AC

## 2018-08-20 MED ORDER — TRAMADOL HCL 50 MG PO TABS: 50.0000 mg | ORAL_TABLET | Freq: Four times a day (QID) | ORAL | 0 refills | Status: DC | PRN

## 2018-08-20 NOTE — Progress Notes (Signed)
Pt discharged to [facility] facility via [place] in stable condition. Report called to facility. Spouse at bedside and made aware. No immediate questions or concerns at this time.

## 2018-08-20 NOTE — Clinical Social Work Placement (Signed)
Pt admitting to [facility] SNF room #902-P Report#[phone] PTAR transportation  Arranged. Pt's wife at bedside agreeable to plan   CLINICAL SOCIAL WORK PLACEMENT  NOTE  Date:  08/20/2018  Patient Details  Name: [name] MRN: [id number] Date of Birth: 1941/03/10  Clinical Social Work is seeking post-discharge placement for this patient at the [place] level of care (*CSW will initial, date and re-position this form in  chart as items are completed):  Yes   Patient/family provided with [facility] Work [facility]'s list of facilities offering this level of care within the geographic area requested by the patient (or if unable, by the patient's family).  Yes   Patient/family informed of their freedom to choose among providers that offer the needed level of care, that participate in Medicare, Medicaid or managed care program needed by the patient, have an available bed and are willing to accept the patient.  Yes   Patient/family informed of [facility]'s ownership interest in [facility] and [facility], as well as of the fact that they are under no obligation to receive care at these facilities.  PASRR submitted to EDS on       PASRR number received on       Existing PASRR number confirmed on 08/15/18     FL2 transmitted to all facilities in geographic area requested by pt/family on       FL2 transmitted to all facilities within larger geographic area on 08/15/18     Patient informed that his/her managed care company has contracts with or will negotiate with certain facilities, including the following:            Patient/family informed of bed offers received.  Patient chooses bed at       Physician recommends and patient chooses bed at      Patient to be transferred to   on  .  Patient to be transferred to facility by       Patient family notified on   of transfer.  Name of family member notified:        PHYSICIAN        Additional Comment:    _______________________________________________ Nila Nephew, LCSW 08/20/2018, 2:04 PM [phone]

## 2018-08-20 NOTE — Progress Notes (Addendum)
Attempted to call report x 3 to facility. Placed on hold. PTAR present and made aware.    Called received from [place], Therapist, sports at [place]. Report given.

## 2018-08-20 NOTE — Discharge Summary (Addendum)
Physician Discharge Summary  [name] ZOX:[id number] DOB: May 29, 1941 DOA: 08/13/2018  PCP: Kathyrn Lass, MD  Admit date: 08/13/2018 Discharge date: 08/20/2018  Admitted From: home Disposition:  SNF   Recommendations for Outpatient Follow-up:  1. Bmet in 3 days to check renal function- resume diuretics as needed once renal function improves  Discharge Condition:  stable   CODE STATUS:  Full code   Consultations:  none    Discharge Diagnoses:  Principal Problem:   Cellulitis of left leg/ multiple venous stasis ulcers Active Problems:   Acute kidney injury superimposed on CKD (HCC)   CKD (chronic kidney disease) stage 3, GFR 30-59 ml/min (HCC)   ADENOCARCINOMA, PROSTATE   Type 2 diabetes mellitus with hyperlipidemia (HCC)   Hyperlipidemia   OBESITY, MORBID   Venous (peripheral) insufficiency   Scrotal edema   Brief Summary: [name] is a 77 y.o. ismalewith medical history significant ofDM, venous stasis, HTN, HLD, stage 3 CKD who is mostly bed and wheelchair bound.   He was seen in the ED on 8/30 for b/l LE cellulitis and was discharged home with clindamycin.    Patient noted to have improvement in cellulitis on R however he states he had no significant improvement on L. Blood cultures noted to have 1/2 gm pos cocci and pt was instructed to return to ED for further work up. He was noted to have on going cellulitis and admitted for on going IV antibiotics.  Hospital Course:  Principal Problem:   Cellulitis of left leg- venous statis ulcers - has numerous ulcers on this leg and will need aggressive wound care in addition to the antibiotics - blood cultures showing gram + cocci in 1/2 sets in aerobic bottle-final culture result reveals ALLOIOCOCCUS OTITIS -Transitioned from vancomycin to Ancef based on above culture results -  changed to Keflex  On 9/7- can d/c antibiotics today as he has had > 7 days and wounds look quite clean now - has had venous duplex of legs on  8/30 which was negative for DVT - wound care assisting with management - he will go to SNF for both wound care and debilitated state -Increase protein in diet to help with wound healing- added Vit C and MVI for wound healing   Active Problems: Diarrhea - he is always incontinent of stool (and urine) - no longer having frequent diarrhea now  - C diff negative  AKI - CKD3 -Creatinine steadily rising from 1.63-2.51>>2.61 > 2.85 - ? Prerenal vs ATN  - Furosemide, Spironolactone, Cozaar on hold since 9/6- encouraged him to drink more fluid- - treated with IVF  - Cr improved today to 2.14  Scrotal swelling - likely dependent edema due to IVF and laying in bed since admission (not ambulating) - follow    Type 2 diabetes mellitus with hyperlipidemia  - apparently taken off insulin as outpt- cont follow in [facility]SI- last A1c was 6.4 on 3/19 and now is 7.3- sugars very stable in the hospital- he has not been requiring insulin  PND, sore throat - start Claritined and Cepacol    Diabetic neuropathy - Gabapentin    Hyperlipidemia -Body mass index is 34.04 kg/m.  Chronic diastolic CHF/ HTN - grade 2 d CHF- diuretics on hold due to AKI   Candida infection in groin - cont nystatin  HLD - Lipitor   Discharge Exam: Vitals:   08/19/18 2118 08/20/18 0610  BP: (!) 143/80 (!) 142/65  Pulse: 65 66  Resp: 19 19  Temp:  98 F (36.7 C) 98 F (36.7 C)  SpO2: 98% 95%   Vitals:   08/19/18 0543 08/19/18 1540 08/19/18 2118 08/20/18 0610  BP: 125/69 135/75 (!) 143/80 (!) 142/65  Pulse: 63 61 65 66  Resp: 14 16 19 19   Temp: 98.5 F (36.9 C) 97.6 F (36.4 C) 98 F (36.7 C) 98 F (36.7 C)  TempSrc: Oral Oral Oral Oral  SpO2: 96% 98% 98% 95%  Weight:      Height:        General: Pt is alert, awake, not in acute distress Cardiovascular: RRR, S1/S2 +, murmur at RUS border Respiratory: CTA bilaterally, no wheezing, no rhonchi Abdominal: Soft, NT, ND, bowel sounds  + Extremities:  no cyanosis- mild edema of left leg Skin: numerous ulcers on left leg    Discharge Instructions  Discharge Instructions    Diet - low sodium heart healthy   Complete by:  As directed    Increase activity slowly   Complete by:  As directed      Allergies as of 08/20/2018      Reactions   Penicillins Other (See Comments)   Unknown reaction. TOLERATES CEFTRIAXONE Has patient had a PCN reaction causing immediate rash, facial/tongue/throat swelling, SOB or lightheadedness with hypotension: Unknown Has patient had a PCN reaction causing severe rash involving mucus membranes or skin necrosis:  /Unknown Has patient had a PCN reaction that required hospitalization: Unknown Has patient had a PCN reaction occurring within the last 10 years: Unknown If all of the above answers are "NO", then may proceed with Cephalosporin u   Sulfa Antibiotics Other (See Comments)   Unknown reaction      Medication List    STOP taking these medications   clindamycin 300 MG capsule Commonly known as:  CLEOCIN   furosemide 20 MG tablet Commonly known as:  LASIX   KLOR-CON M20 20 MEQ tablet Generic drug:  potassium chloride SA   spironolactone 100 MG tablet Commonly known as:  ALDACTONE     TAKE these medications   acetaminophen 500 MG tablet Commonly known as:  TYLENOL Take 1,000-1,500 mg by mouth 2 (two) times daily as needed (leg pain).   ascorbic acid 500 MG tablet Commonly known as:  VITAMIN C Take 1 tablet (500 mg total) by mouth 2 (two) times daily.   aspirin EC 81 MG tablet Take 81 mg by mouth daily.   atorvastatin 80 MG tablet Commonly known as:  LIPITOR Take 80 mg by mouth daily.   BUDEPRION XL 300 MG 24 hr tablet Generic drug:  buPROPion Take 300 mg by mouth daily.   diclofenac sodium 1 % Gel Commonly known as:  VOLTAREN Apply 2 g topically 4 (four) times daily.   gabapentin 400 MG capsule Commonly known as:  NEURONTIN Take 1 capsule (400 mg total) by  mouth 3 (three) times daily.   LEXAPRO 10 MG tablet Generic drug:  escitalopram Take 10 mg by mouth daily.   losartan 50 MG tablet Commonly known as:  COZAAR Take 50 mg by mouth daily.   menthol-cetylpyridinium 3 MG lozenge Commonly known as:  CEPACOL Take 1 lozenge (3 mg total) by mouth as needed for sore throat.   multivitamin with minerals Tabs tablet Take 1 tablet by mouth daily.   neomycin-polymyxin b-dexamethasone 3.5-10000-0.1 Susp Commonly known as:  MAXITROL Place 1 drop into both eyes every 6 (six) hours.   nutrition supplement (JUVEN) Pack Take 1 packet by mouth 2 (two) times daily between meals.  nystatin powder Commonly known as:  MYCOSTATIN/NYSTOP Apply topically 4 (four) times daily. To groin.   oxybutynin 5 MG tablet Commonly known as:  DITROPAN Take 5 mg by mouth 2 (two) times daily.   RAPAFLO 8 MG Caps capsule Generic drug:  silodosin Take 8 mg by mouth daily.   REFRESH OP Place 1 drop into both eyes daily as needed (bacterial infection).   traMADol 50 MG tablet Commonly known as:  ULTRAM Take 2 tablets (100 mg total) by mouth every 6 (six) hours as needed for severe pain.   traMADol 50 MG tablet Commonly known as:  ULTRAM Take 1 tablet (50 mg total) by mouth every 6 (six) hours as needed (mild pain).      Contact information for after-discharge care    Destination    [facility] .   Service:  Skilled Nursing Contact information: [place] [address] [phone]             Allergies  Allergen Reactions  . Penicillins Other (See Comments)    Unknown reaction. TOLERATES CEFTRIAXONE  Has patient had a PCN reaction causing immediate rash, facial/tongue/throat swelling, SOB or lightheadedness with hypotension: Unknown Has patient had a PCN reaction causing severe rash involving mucus membranes or skin necrosis:  /Unknown Has patient had a PCN reaction that required hospitalization:  Unknown Has patient had a PCN reaction occurring within the last 10 years: Unknown If all of the above answers are "NO", then may proceed with Cephalosporin u  . Sulfa Antibiotics Other (See Comments)    Unknown reaction     Procedures/Studies:    Dg Chest Port 1 View  Result Date: 08/18/2018 CLINICAL DATA:  Cough and congestion.  Cellulitis. EXAM: PORTABLE CHEST 1 VIEW COMPARISON:  02/18/2018 FINDINGS: Enlarged cardiac silhouette. Calcific atherosclerotic disease of the aorta. Prominence of the interstitium. Probable right pleural effusion. Opacity of the right cardiophrenic angle may represent airspace consolidation, atelectasis or potentially a pulmonary nodule. Osseous structures are without acute abnormality. Soft tissues are grossly normal. IMPRESSION: Enlarged cardiac silhouette with interstitial pulmonary edema. Persistent right pleural effusion. Opacity of the right cardiophrenic angle may represent airspace consolidation, atelectasis or potentially a pulmonary nodule. Electronically Signed   By: Fidela Salisbury M.D.   On: 08/18/2018 14:11     The results of significant diagnostics from this hospitalization (including imaging, microbiology, ancillary and laboratory) are listed below for reference.     Microbiology: Recent Results (from the past 240 hour(s))  Culture, blood (routine x 2)     Status: None   Collection Time: 08/13/18  4:15 PM  Result Value Ref Range Status   Specimen Description   Final    BLOOD RIGHT FOREARM Performed at [facility], [place] [address] [place], [place] [address]    Special Requests   Final    BOTTLES DRAWN AEROBIC AND ANAEROBIC Blood Culture adequate volume Performed at [place] [address], [place], [place] [address]    Culture   Final    NO GROWTH 5 DAYS Performed at [facility], [place] [address], [place], [place] [address]    Report Status 08/18/2018 FINAL  Final  Culture, blood  (routine x 2)     Status: None   Collection Time: 08/13/18  4:38 PM  Result Value Ref Range Status   Specimen Description   Final    BLOOD LEFT HAND Performed at [place] [address]  [place], [place] [address]    Special Requests   Final    BOTTLES DRAWN AEROBIC AND ANAEROBIC Blood Culture adequate volume Performed at [place] [address], [place], [place] [address]    Culture   Final    NO GROWTH 5 DAYS Performed at [facility], [place] [address] [place], [place] [address]    Report Status 08/18/2018 FINAL  Final  C difficile quick scan w PCR reflex     Status: None   Collection Time: 08/18/18  4:40 PM  Result Value Ref Range Status   C Diff antigen NEGATIVE NEGATIVE Final   C Diff toxin NEGATIVE NEGATIVE Final   C Diff interpretation No C. difficile detected.  Final    Comment: Performed at [facility], [place] [address] [place], [place] [address]     Labs: BNP (last 3 results) Recent Labs    12/06/17 0909  BNP 8,850.2*   Basic Metabolic Panel: Recent Labs  Lab 08/16/18 0518 08/17/18 0409 08/18/18 0348 08/19/18 0443 08/20/18 0514  NA 138 137 137 138 139  K 4.9 4.6 4.7 4.6 4.3  CL 106 107 108 113* 112*  CO2 24 23 22  20* 19*  GLUCOSE 125* 129* 107* 115* 103*  BUN 71* 73* 80* 84* 73*  CREATININE 2.51* 2.61* 2.85* 2.63* 2.14*  CALCIUM 8.2* 8.2* 8.2* 8.2* 8.3*   Liver Function Tests: Recent Labs  Lab 08/14/18 0515  AST 12*  ALT 10  ALKPHOS 87  BILITOT 0.8  PROT 6.5  ALBUMIN 2.7*   No results for input(s): LIPASE, AMYLASE in the last 168 hours. No results for input(s): AMMONIA in the last 168 hours. CBC: Recent Labs  Lab 08/13/18 1350 08/14/18 0515 08/19/18 0443  WBC 4.9 4.2 5.2  NEUTROABS 3.8  --   --   HGB 10.6* 9.9* 10.1*  HCT 32.3* 30.6* 32.1*  MCV 91.0 91.3 92.8  PLT 246 227 233   Cardiac Enzymes: No results for input(s): CKTOTAL, CKMB, CKMBINDEX, TROPONINI in the  last 168 hours. BNP: Invalid input(s): POCBNP CBG: Recent Labs  Lab 08/19/18 1330 08/19/18 1549 08/19/18 2119 08/20/18 0727 08/20/18 1139  GLUCAP 112* 92 127* 91 140*   D-Dimer No results for input(s): DDIMER in the last 72 hours. Hgb A1c No results for input(s): HGBA1C in the last 72 hours. Lipid Profile No results for input(s): CHOL, HDL, LDLCALC, TRIG, CHOLHDL, LDLDIRECT in the last 72 hours. Thyroid function studies No results for input(s): TSH, T4TOTAL, T3FREE, THYROIDAB in the last 72 hours.  Invalid input(s): FREET3 Anemia work up No results for input(s): VITAMINB12, FOLATE, FERRITIN, TIBC, IRON, RETICCTPCT in the last 72 hours. Urinalysis    Component Value Date/Time   COLORURINE YELLOW 02/18/2018 1212   APPEARANCEUR CLOUDY (A) 02/18/2018 1212   LABSPEC 1.015 02/18/2018 1212   PHURINE 5.0 02/18/2018 1212   GLUCOSEU 50 (A) 02/18/2018 1212   HGBUR LARGE (A) 02/18/2018 1212   BILIRUBINUR NEGATIVE 02/18/2018 1212   KETONESUR NEGATIVE 02/18/2018 1212   PROTEINUR 100 (A) 02/18/2018 1212   UROBILINOGEN 0.2 06/09/2011 0522   NITRITE NEGATIVE 02/18/2018 1212   LEUKOCYTESUR NEGATIVE 02/18/2018 1212   Sepsis Labs Invalid input(s): PROCALCITONIN,  WBC,  LACTICIDVEN Microbiology Recent Results (from the past 240 hour(s))  Culture, blood (routine x 2)     Status: None   Collection Time: 08/13/18  4:15 PM  Result Value Ref Range Status   Specimen Description   Final    BLOOD RIGHT FOREARM  Performed at [facility], [place] [address], [place], [place] [address]    Special Requests   Final    BOTTLES DRAWN AEROBIC AND ANAEROBIC Blood Culture adequate volume Performed at [place] [address], [place], [place] [address]    Culture   Final    NO GROWTH 5 DAYS Performed at [facility], [place] [address] [place], [place] [address]    Report Status 08/18/2018 FINAL  Final  Culture, blood (routine x 2)     Status: None    Collection Time: 08/13/18  4:38 PM  Result Value Ref Range Status   Specimen Description   Final    BLOOD LEFT HAND Performed at [place] [address] [place], [place] [address]    Special Requests   Final    BOTTLES DRAWN AEROBIC AND ANAEROBIC Blood Culture adequate volume Performed at [facility] [address], [place], [place] [address]    Culture   Final    NO GROWTH 5 DAYS Performed at [facility], [place] [address] [place], [place] [address]    Report Status 08/18/2018 FINAL  Final  C difficile quick scan w PCR reflex     Status: None   Collection Time: 08/18/18  4:40 PM  Result Value Ref Range Status   C Diff antigen NEGATIVE NEGATIVE Final   C Diff toxin NEGATIVE NEGATIVE Final   C Diff interpretation No C. difficile detected.  Final    Comment: Performed at [facility], [place] [address] [place],  [address]     Time coordinating discharge in minutes: 57  SIGNED:   Debbe Odea, MD  [facility] 08/20/2018, 11:53 AM Pager   If 7PM-7AM, please contact night-coverage [email or web address] Password [email or web address]

## 2018-08-21 DIAGNOSIS — L97221 Non-pressure chronic ulcer of left calf limited to breakdown of skin: Secondary | ICD-10-CM | POA: Diagnosis not present

## 2018-08-21 DIAGNOSIS — D649 Anemia, unspecified: Secondary | ICD-10-CM | POA: Diagnosis not present

## 2018-08-21 DIAGNOSIS — N179 Acute kidney failure, unspecified: Secondary | ICD-10-CM | POA: Diagnosis not present

## 2018-08-21 DIAGNOSIS — I872 Venous insufficiency (chronic) (peripheral): Secondary | ICD-10-CM | POA: Diagnosis not present

## 2018-08-23 DIAGNOSIS — N189 Chronic kidney disease, unspecified: Secondary | ICD-10-CM | POA: Diagnosis not present

## 2018-08-23 DIAGNOSIS — D649 Anemia, unspecified: Secondary | ICD-10-CM | POA: Diagnosis not present

## 2018-08-23 DIAGNOSIS — L0390 Cellulitis, unspecified: Secondary | ICD-10-CM | POA: Diagnosis not present

## 2018-08-26 DIAGNOSIS — L97221 Non-pressure chronic ulcer of left calf limited to breakdown of skin: Secondary | ICD-10-CM | POA: Diagnosis not present

## 2018-08-26 DIAGNOSIS — N189 Chronic kidney disease, unspecified: Secondary | ICD-10-CM | POA: Diagnosis not present

## 2018-08-26 DIAGNOSIS — F419 Anxiety disorder, unspecified: Secondary | ICD-10-CM | POA: Diagnosis not present

## 2018-08-26 DIAGNOSIS — I509 Heart failure, unspecified: Secondary | ICD-10-CM | POA: Diagnosis not present

## 2018-08-28 DIAGNOSIS — I83029 Varicose veins of left lower extremity with ulcer of unspecified site: Secondary | ICD-10-CM | POA: Diagnosis not present

## 2018-08-28 DIAGNOSIS — L259 Unspecified contact dermatitis, unspecified cause: Secondary | ICD-10-CM | POA: Diagnosis not present

## 2018-08-28 DIAGNOSIS — I509 Heart failure, unspecified: Secondary | ICD-10-CM | POA: Diagnosis not present

## 2018-08-28 DIAGNOSIS — I872 Venous insufficiency (chronic) (peripheral): Secondary | ICD-10-CM | POA: Diagnosis not present

## 2018-08-28 DIAGNOSIS — I83028 Varicose veins of left lower extremity with ulcer other part of lower leg: Secondary | ICD-10-CM | POA: Diagnosis not present

## 2018-08-30 DIAGNOSIS — N179 Acute kidney failure, unspecified: Secondary | ICD-10-CM | POA: Diagnosis not present

## 2018-08-30 DIAGNOSIS — I509 Heart failure, unspecified: Secondary | ICD-10-CM | POA: Diagnosis not present

## 2018-09-02 DIAGNOSIS — R05 Cough: Secondary | ICD-10-CM | POA: Diagnosis not present

## 2018-09-02 DIAGNOSIS — R531 Weakness: Secondary | ICD-10-CM | POA: Diagnosis not present

## 2018-09-02 DIAGNOSIS — R300 Dysuria: Secondary | ICD-10-CM | POA: Diagnosis not present

## 2018-09-02 DIAGNOSIS — N189 Chronic kidney disease, unspecified: Secondary | ICD-10-CM | POA: Diagnosis not present

## 2018-09-03 DIAGNOSIS — R0602 Shortness of breath: Secondary | ICD-10-CM | POA: Diagnosis not present

## 2018-09-03 DIAGNOSIS — R531 Weakness: Secondary | ICD-10-CM | POA: Diagnosis not present

## 2018-09-03 DIAGNOSIS — N189 Chronic kidney disease, unspecified: Secondary | ICD-10-CM | POA: Diagnosis not present

## 2018-09-03 DIAGNOSIS — R300 Dysuria: Secondary | ICD-10-CM | POA: Diagnosis not present

## 2018-09-04 DIAGNOSIS — L8990 Pressure ulcer of unspecified site, unspecified stage: Secondary | ICD-10-CM | POA: Diagnosis not present

## 2018-09-04 DIAGNOSIS — R110 Nausea: Secondary | ICD-10-CM | POA: Diagnosis not present

## 2018-09-04 DIAGNOSIS — R05 Cough: Secondary | ICD-10-CM | POA: Diagnosis not present

## 2018-09-04 DIAGNOSIS — E876 Hypokalemia: Secondary | ICD-10-CM | POA: Diagnosis not present

## 2018-09-06 DIAGNOSIS — R790 Abnormal level of blood mineral: Secondary | ICD-10-CM | POA: Diagnosis not present

## 2018-09-06 DIAGNOSIS — E876 Hypokalemia: Secondary | ICD-10-CM | POA: Diagnosis not present

## 2018-09-06 DIAGNOSIS — I509 Heart failure, unspecified: Secondary | ICD-10-CM | POA: Diagnosis not present

## 2018-09-06 DIAGNOSIS — L0390 Cellulitis, unspecified: Secondary | ICD-10-CM | POA: Diagnosis not present

## 2018-09-09 DIAGNOSIS — L0390 Cellulitis, unspecified: Secondary | ICD-10-CM | POA: Diagnosis not present

## 2018-09-09 DIAGNOSIS — L8990 Pressure ulcer of unspecified site, unspecified stage: Secondary | ICD-10-CM | POA: Diagnosis not present

## 2018-09-09 DIAGNOSIS — E119 Type 2 diabetes mellitus without complications: Secondary | ICD-10-CM | POA: Diagnosis not present

## 2018-09-09 DIAGNOSIS — I878 Other specified disorders of veins: Secondary | ICD-10-CM | POA: Diagnosis not present

## 2018-09-10 DIAGNOSIS — E876 Hypokalemia: Secondary | ICD-10-CM | POA: Diagnosis not present

## 2018-09-10 DIAGNOSIS — N189 Chronic kidney disease, unspecified: Secondary | ICD-10-CM | POA: Diagnosis not present

## 2018-09-10 DIAGNOSIS — E119 Type 2 diabetes mellitus without complications: Secondary | ICD-10-CM | POA: Diagnosis not present

## 2018-09-10 DIAGNOSIS — D649 Anemia, unspecified: Secondary | ICD-10-CM | POA: Diagnosis not present

## 2018-09-11 DIAGNOSIS — F419 Anxiety disorder, unspecified: Secondary | ICD-10-CM | POA: Diagnosis not present

## 2018-09-11 DIAGNOSIS — F329 Major depressive disorder, single episode, unspecified: Secondary | ICD-10-CM | POA: Diagnosis not present

## 2018-09-17 DIAGNOSIS — E669 Obesity, unspecified: Secondary | ICD-10-CM | POA: Diagnosis not present

## 2018-09-17 DIAGNOSIS — R54 Age-related physical debility: Secondary | ICD-10-CM | POA: Diagnosis not present

## 2018-09-17 DIAGNOSIS — E119 Type 2 diabetes mellitus without complications: Secondary | ICD-10-CM | POA: Diagnosis not present

## 2018-09-17 DIAGNOSIS — N189 Chronic kidney disease, unspecified: Secondary | ICD-10-CM | POA: Diagnosis not present

## 2018-09-18 DIAGNOSIS — N189 Chronic kidney disease, unspecified: Secondary | ICD-10-CM | POA: Diagnosis not present

## 2018-09-18 DIAGNOSIS — E876 Hypokalemia: Secondary | ICD-10-CM | POA: Diagnosis not present

## 2018-09-20 DIAGNOSIS — I878 Other specified disorders of veins: Secondary | ICD-10-CM | POA: Diagnosis not present

## 2018-09-20 DIAGNOSIS — F329 Major depressive disorder, single episode, unspecified: Secondary | ICD-10-CM | POA: Diagnosis not present

## 2018-09-20 DIAGNOSIS — E119 Type 2 diabetes mellitus without complications: Secondary | ICD-10-CM | POA: Diagnosis not present

## 2018-09-20 DIAGNOSIS — E876 Hypokalemia: Secondary | ICD-10-CM | POA: Diagnosis not present

## 2018-09-25 DIAGNOSIS — F419 Anxiety disorder, unspecified: Secondary | ICD-10-CM | POA: Diagnosis not present

## 2018-09-25 DIAGNOSIS — F329 Major depressive disorder, single episode, unspecified: Secondary | ICD-10-CM | POA: Diagnosis not present

## 2018-09-30 DIAGNOSIS — R509 Fever, unspecified: Secondary | ICD-10-CM | POA: Diagnosis not present

## 2018-09-30 DIAGNOSIS — I878 Other specified disorders of veins: Secondary | ICD-10-CM | POA: Diagnosis not present

## 2018-09-30 DIAGNOSIS — L0390 Cellulitis, unspecified: Secondary | ICD-10-CM | POA: Diagnosis not present

## 2018-09-30 DIAGNOSIS — I509 Heart failure, unspecified: Secondary | ICD-10-CM | POA: Diagnosis not present

## 2018-10-01 DIAGNOSIS — J22 Unspecified acute lower respiratory infection: Secondary | ICD-10-CM | POA: Diagnosis not present

## 2018-10-01 DIAGNOSIS — I509 Heart failure, unspecified: Secondary | ICD-10-CM | POA: Diagnosis not present

## 2018-10-01 DIAGNOSIS — R509 Fever, unspecified: Secondary | ICD-10-CM | POA: Diagnosis not present

## 2018-10-01 DIAGNOSIS — I10 Essential (primary) hypertension: Secondary | ICD-10-CM | POA: Diagnosis not present

## 2018-10-02 DIAGNOSIS — N390 Urinary tract infection, site not specified: Secondary | ICD-10-CM | POA: Diagnosis not present

## 2018-10-02 DIAGNOSIS — J189 Pneumonia, unspecified organism: Secondary | ICD-10-CM | POA: Diagnosis not present

## 2018-10-09 DIAGNOSIS — R634 Abnormal weight loss: Secondary | ICD-10-CM | POA: Diagnosis not present

## 2018-10-11 DIAGNOSIS — E860 Dehydration: Secondary | ICD-10-CM | POA: Diagnosis not present

## 2018-10-11 DIAGNOSIS — N189 Chronic kidney disease, unspecified: Secondary | ICD-10-CM | POA: Diagnosis not present

## 2018-10-11 DIAGNOSIS — I10 Essential (primary) hypertension: Secondary | ICD-10-CM | POA: Diagnosis not present

## 2018-10-11 DIAGNOSIS — E119 Type 2 diabetes mellitus without complications: Secondary | ICD-10-CM | POA: Diagnosis not present

## 2018-10-14 DIAGNOSIS — N189 Chronic kidney disease, unspecified: Secondary | ICD-10-CM | POA: Diagnosis not present

## 2018-10-14 DIAGNOSIS — N390 Urinary tract infection, site not specified: Secondary | ICD-10-CM | POA: Diagnosis not present

## 2018-10-14 DIAGNOSIS — R634 Abnormal weight loss: Secondary | ICD-10-CM | POA: Diagnosis not present

## 2018-10-14 DIAGNOSIS — I509 Heart failure, unspecified: Secondary | ICD-10-CM | POA: Diagnosis not present

## 2018-10-16 DIAGNOSIS — R410 Disorientation, unspecified: Secondary | ICD-10-CM | POA: Diagnosis not present

## 2018-10-16 DIAGNOSIS — E860 Dehydration: Secondary | ICD-10-CM | POA: Diagnosis not present

## 2018-10-17 ENCOUNTER — Encounter (HOSPITAL_COMMUNITY): Payer: Self-pay | Admitting: Emergency Medicine

## 2018-10-17 ENCOUNTER — Emergency Department (HOSPITAL_COMMUNITY): Payer: Medicare Other

## 2018-10-17 ENCOUNTER — Inpatient Hospital Stay (HOSPITAL_COMMUNITY)
Admission: EM | Admit: 2018-10-17 | Discharge: 2018-10-22 | DRG: 682 | Disposition: A | Discharge status: Skilled Nursing Facility | Payer: Medicare Other | Source: Skilled Nursing Facility | Attending: Family Medicine | Admitting: Family Medicine

## 2018-10-17 DIAGNOSIS — E785 Hyperlipidemia, unspecified: Secondary | ICD-10-CM | POA: Diagnosis present

## 2018-10-17 DIAGNOSIS — L03115 Cellulitis of right lower limb: Secondary | ICD-10-CM | POA: Diagnosis present

## 2018-10-17 DIAGNOSIS — E1169 Type 2 diabetes mellitus with other specified complication: Secondary | ICD-10-CM | POA: Diagnosis present

## 2018-10-17 DIAGNOSIS — E860 Dehydration: Secondary | ICD-10-CM | POA: Diagnosis present

## 2018-10-17 DIAGNOSIS — I5032 Chronic diastolic (congestive) heart failure: Secondary | ICD-10-CM | POA: Diagnosis present

## 2018-10-17 DIAGNOSIS — G9340 Encephalopathy, unspecified: Secondary | ICD-10-CM | POA: Diagnosis not present

## 2018-10-17 DIAGNOSIS — Z8701 Personal history of pneumonia (recurrent): Secondary | ICD-10-CM

## 2018-10-17 DIAGNOSIS — L03116 Cellulitis of left lower limb: Secondary | ICD-10-CM | POA: Diagnosis present

## 2018-10-17 DIAGNOSIS — M5489 Other dorsalgia: Secondary | ICD-10-CM | POA: Diagnosis not present

## 2018-10-17 DIAGNOSIS — N189 Chronic kidney disease, unspecified: Secondary | ICD-10-CM | POA: Diagnosis not present

## 2018-10-17 DIAGNOSIS — I509 Heart failure, unspecified: Secondary | ICD-10-CM | POA: Diagnosis not present

## 2018-10-17 DIAGNOSIS — Z8546 Personal history of malignant neoplasm of prostate: Secondary | ICD-10-CM | POA: Diagnosis not present

## 2018-10-17 DIAGNOSIS — R4182 Altered mental status, unspecified: Secondary | ICD-10-CM | POA: Diagnosis not present

## 2018-10-17 DIAGNOSIS — G9341 Metabolic encephalopathy: Secondary | ICD-10-CM | POA: Diagnosis present

## 2018-10-17 DIAGNOSIS — I878 Other specified disorders of veins: Secondary | ICD-10-CM | POA: Diagnosis present

## 2018-10-17 DIAGNOSIS — I872 Venous insufficiency (chronic) (peripheral): Secondary | ICD-10-CM | POA: Diagnosis present

## 2018-10-17 DIAGNOSIS — E876 Hypokalemia: Secondary | ICD-10-CM | POA: Diagnosis present

## 2018-10-17 DIAGNOSIS — Z7982 Long term (current) use of aspirin: Secondary | ICD-10-CM | POA: Diagnosis not present

## 2018-10-17 DIAGNOSIS — N3000 Acute cystitis without hematuria: Secondary | ICD-10-CM | POA: Diagnosis present

## 2018-10-17 DIAGNOSIS — N179 Acute kidney failure, unspecified: Secondary | ICD-10-CM | POA: Diagnosis not present

## 2018-10-17 DIAGNOSIS — I959 Hypotension, unspecified: Secondary | ICD-10-CM | POA: Diagnosis present

## 2018-10-17 DIAGNOSIS — Z8619 Personal history of other infectious and parasitic diseases: Secondary | ICD-10-CM | POA: Diagnosis not present

## 2018-10-17 DIAGNOSIS — R279 Unspecified lack of coordination: Secondary | ICD-10-CM | POA: Diagnosis not present

## 2018-10-17 DIAGNOSIS — E1122 Type 2 diabetes mellitus with diabetic chronic kidney disease: Secondary | ICD-10-CM | POA: Diagnosis present

## 2018-10-17 DIAGNOSIS — M545 Low back pain: Secondary | ICD-10-CM | POA: Diagnosis present

## 2018-10-17 DIAGNOSIS — I130 Hypertensive heart and chronic kidney disease with heart failure and stage 1 through stage 4 chronic kidney disease, or unspecified chronic kidney disease: Secondary | ICD-10-CM | POA: Diagnosis present

## 2018-10-17 DIAGNOSIS — R293 Abnormal posture: Secondary | ICD-10-CM | POA: Diagnosis not present

## 2018-10-17 DIAGNOSIS — N183 Chronic kidney disease, stage 3 unspecified: Secondary | ICD-10-CM | POA: Diagnosis present

## 2018-10-17 DIAGNOSIS — S81809A Unspecified open wound, unspecified lower leg, initial encounter: Secondary | ICD-10-CM

## 2018-10-17 DIAGNOSIS — S299XXA Unspecified injury of thorax, initial encounter: Secondary | ICD-10-CM | POA: Diagnosis not present

## 2018-10-17 DIAGNOSIS — S81009A Unspecified open wound, unspecified knee, initial encounter: Secondary | ICD-10-CM | POA: Diagnosis present

## 2018-10-17 DIAGNOSIS — S91009A Unspecified open wound, unspecified ankle, initial encounter: Secondary | ICD-10-CM

## 2018-10-17 DIAGNOSIS — S3992XA Unspecified injury of lower back, initial encounter: Secondary | ICD-10-CM | POA: Diagnosis not present

## 2018-10-17 DIAGNOSIS — Z8249 Family history of ischemic heart disease and other diseases of the circulatory system: Secondary | ICD-10-CM | POA: Diagnosis not present

## 2018-10-17 DIAGNOSIS — R278 Other lack of coordination: Secondary | ICD-10-CM | POA: Diagnosis not present

## 2018-10-17 DIAGNOSIS — R2681 Unsteadiness on feet: Secondary | ICD-10-CM | POA: Diagnosis not present

## 2018-10-17 DIAGNOSIS — Z923 Personal history of irradiation: Secondary | ICD-10-CM

## 2018-10-17 DIAGNOSIS — R41841 Cognitive communication deficit: Secondary | ICD-10-CM | POA: Diagnosis not present

## 2018-10-17 DIAGNOSIS — M6281 Muscle weakness (generalized): Secondary | ICD-10-CM | POA: Diagnosis not present

## 2018-10-17 DIAGNOSIS — N342 Other urethritis: Secondary | ICD-10-CM | POA: Diagnosis not present

## 2018-10-17 DIAGNOSIS — Z743 Need for continuous supervision: Secondary | ICD-10-CM | POA: Diagnosis not present

## 2018-10-17 DIAGNOSIS — N390 Urinary tract infection, site not specified: Secondary | ICD-10-CM | POA: Diagnosis present

## 2018-10-17 DIAGNOSIS — L8990 Pressure ulcer of unspecified site, unspecified stage: Secondary | ICD-10-CM

## 2018-10-17 DIAGNOSIS — I129 Hypertensive chronic kidney disease with stage 1 through stage 4 chronic kidney disease, or unspecified chronic kidney disease: Secondary | ICD-10-CM | POA: Diagnosis not present

## 2018-10-17 LAB — BASIC METABOLIC PANEL
Anion gap: 11 (ref 5–15)
BUN: 69 mg/dL — AB (ref 8–23)
CALCIUM: 8.4 mg/dL — AB (ref 8.9–10.3)
CO2: 17 mmol/L — ABNORMAL LOW (ref 22–32)
Chloride: 110 mmol/L (ref 98–111)
Creatinine, Ser: 3.07 mg/dL — ABNORMAL HIGH (ref 0.61–1.24)
GFR calc Af Amer: 21 mL/min — ABNORMAL LOW (ref >60)
GFR, EST NON AFRICAN AMERICAN: 18 mL/min — AB (ref >60)
GLUCOSE: 163 mg/dL — AB (ref 70–99)
POTASSIUM: 3.2 mmol/L — AB (ref 3.5–5.1)
SODIUM: 138 mmol/L (ref 135–145)

## 2018-10-17 LAB — CBC
HCT: 33.5 % — ABNORMAL LOW (ref 39.0–52.0)
Hemoglobin: 9.6 g/dL — ABNORMAL LOW (ref 13.0–17.0)
MCH: 27.5 pg (ref 26.0–34.0)
MCHC: 28.7 g/dL — ABNORMAL LOW (ref 30.0–36.0)
MCV: 96 fL (ref 80.0–100.0)
Platelets: 297 10*3/uL (ref 150–400)
RBC: 3.49 MIL/uL — ABNORMAL LOW (ref 4.22–5.81)
RDW: 16.1 % — ABNORMAL HIGH (ref 11.5–15.5)
WBC: 6.4 10*3/uL (ref 4.0–10.5)
nRBC: 0 % (ref 0.0–0.2)

## 2018-10-17 LAB — URINALYSIS, ROUTINE W REFLEX MICROSCOPIC
Bilirubin Urine: NEGATIVE
Glucose, UA: NEGATIVE mg/dL
Ketones, ur: NEGATIVE mg/dL
Nitrite: NEGATIVE
Protein, ur: 30 mg/dL — AB
Specific Gravity, Urine: 1.01 (ref 1.005–1.030)
WBC, UA: >50 WBC/hpf — ABNORMAL HIGH (ref 0–5)
pH: 5 (ref 5.0–8.0)

## 2018-10-17 LAB — I-STAT CG4 LACTIC ACID, ED: LACTIC ACID, VENOUS: 1.17 mmol/L (ref 0.5–1.9)

## 2018-10-17 LAB — MAGNESIUM: MAGNESIUM: 2 mg/dL (ref 1.7–2.4)

## 2018-10-17 MED ORDER — SODIUM CHLORIDE 0.9 % IV BOLUS
1000.0000 mL | Freq: Once | INTRAVENOUS | Status: AC
  Administered 2018-10-17: 1000 mL via INTRAVENOUS

## 2018-10-17 MED ORDER — ONDANSETRON HCL 4 MG/2ML IJ SOLN: 4.0000 mg | Freq: Four times a day (QID) | INTRAMUSCULAR | Status: DC | PRN

## 2018-10-17 MED ORDER — MENTHOL 3 MG MT LOZG: 1.0000 | LOZENGE | OROMUCOSAL | Status: DC | PRN

## 2018-10-17 MED ORDER — BUPROPION HCL ER (XL) 150 MG PO TB24
300.0000 mg | ORAL_TABLET | Freq: Every day | ORAL | Status: DC
  Administered 2018-10-18 – 2018-10-22 (×5): 300 mg via ORAL
  Filled 2018-10-17 (×5): qty 2

## 2018-10-17 MED ORDER — SODIUM CHLORIDE 0.9 % IV BOLUS
1000.0000 mL | Freq: Once | INTRAVENOUS | Status: DC
  Administered 2018-10-17: 1000 mL via INTRAVENOUS

## 2018-10-17 MED ORDER — POTASSIUM CHLORIDE CRYS ER 20 MEQ PO TBCR: 60.0000 meq | EXTENDED_RELEASE_TABLET | Freq: Every day | ORAL | Status: DC

## 2018-10-17 MED ORDER — ASPIRIN EC 81 MG PO TBEC
81.0000 mg | DELAYED_RELEASE_TABLET | Freq: Every day | ORAL | Status: DC
  Administered 2018-10-18 – 2018-10-22 (×5): 81 mg via ORAL
  Filled 2018-10-17 (×5): qty 1

## 2018-10-17 MED ORDER — VITAMIN C 500 MG PO TABS
500.0000 mg | ORAL_TABLET | Freq: Two times a day (BID) | ORAL | Status: DC
  Administered 2018-10-17 – 2018-10-22 (×10): 500 mg via ORAL
  Filled 2018-10-17 (×10): qty 1

## 2018-10-17 MED ORDER — ACETAMINOPHEN 500 MG PO TABS
1000.0000 mg | ORAL_TABLET | Freq: Two times a day (BID) | ORAL | Status: DC | PRN
  Administered 2018-10-22: 1000 mg via ORAL
  Filled 2018-10-17: qty 2

## 2018-10-17 MED ORDER — DICLOFENAC SODIUM 1 % TD GEL
2.0000 g | Freq: Three times a day (TID) | TRANSDERMAL | Status: DC
  Administered 2018-10-17 – 2018-10-22 (×14): 2 g via TOPICAL
  Filled 2018-10-17: qty 100

## 2018-10-17 MED ORDER — SODIUM CHLORIDE 0.45 % IV SOLN
INTRAVENOUS | Status: DC
  Administered 2018-10-17: 950 mL via INTRAVENOUS
  Administered 2018-10-19 – 2018-10-22 (×3): via INTRAVENOUS

## 2018-10-17 MED ORDER — GABAPENTIN 400 MG PO CAPS
400.0000 mg | ORAL_CAPSULE | Freq: Three times a day (TID) | ORAL | Status: DC
  Administered 2018-10-17 – 2018-10-22 (×14): 400 mg via ORAL
  Filled 2018-10-17 (×14): qty 1

## 2018-10-17 MED ORDER — SODIUM CHLORIDE 0.9 % IV SOLN
2.0000 g | Freq: Once | INTRAVENOUS | Status: AC
  Administered 2018-10-17: 2 g via INTRAVENOUS
  Filled 2018-10-17: qty 2

## 2018-10-17 MED ORDER — SODIUM CHLORIDE 0.9 % IV SOLN
1.0000 g | INTRAVENOUS | Status: DC
  Administered 2018-10-18 – 2018-10-19 (×2): 1 g via INTRAVENOUS
  Filled 2018-10-17 (×3): qty 1

## 2018-10-17 MED ORDER — HEPARIN SODIUM (PORCINE) 5000 UNIT/ML IJ SOLN
5000.0000 [IU] | Freq: Three times a day (TID) | INTRAMUSCULAR | Status: DC
  Administered 2018-10-17 – 2018-10-22 (×15): 5000 [IU] via SUBCUTANEOUS
  Filled 2018-10-17 (×12): qty 1

## 2018-10-17 MED ORDER — SODIUM CHLORIDE 0.9 % IV SOLN
1.0000 g | INTRAVENOUS | Status: DC
  Filled 2018-10-17: qty 1

## 2018-10-17 MED ORDER — TAMSULOSIN HCL 0.4 MG PO CAPS
0.4000 mg | ORAL_CAPSULE | Freq: Every day | ORAL | Status: DC
  Administered 2018-10-18 – 2018-10-22 (×5): 0.4 mg via ORAL
  Filled 2018-10-17 (×5): qty 1

## 2018-10-17 MED ORDER — ATORVASTATIN CALCIUM 80 MG PO TABS
80.0000 mg | ORAL_TABLET | Freq: Every day | ORAL | Status: DC
  Administered 2018-10-18 – 2018-10-22 (×5): 80 mg via ORAL
  Filled 2018-10-17 (×5): qty 1

## 2018-10-17 MED ORDER — ADULT MULTIVITAMIN W/MINERALS CH
1.0000 | ORAL_TABLET | Freq: Every day | ORAL | Status: DC
  Administered 2018-10-18 – 2018-10-22 (×5): 1 via ORAL
  Filled 2018-10-17 (×5): qty 1

## 2018-10-17 MED ORDER — ESCITALOPRAM OXALATE 10 MG PO TABS
15.0000 mg | ORAL_TABLET | Freq: Every day | ORAL | Status: DC
  Administered 2018-10-18 – 2018-10-22 (×5): 15 mg via ORAL
  Filled 2018-10-17 (×5): qty 2

## 2018-10-17 MED ORDER — TRAMADOL HCL 50 MG PO TABS: 50.0000 mg | ORAL_TABLET | Freq: Four times a day (QID) | ORAL | Status: DC | PRN

## 2018-10-17 MED ORDER — ONDANSETRON HCL 4 MG PO TABS: 4.0000 mg | ORAL_TABLET | Freq: Four times a day (QID) | ORAL | Status: DC | PRN

## 2018-10-17 MED ORDER — NYSTATIN 100000 UNIT/GM EX POWD
Freq: Four times a day (QID) | CUTANEOUS | Status: DC
  Administered 2018-10-17 – 2018-10-22 (×19): via TOPICAL
  Filled 2018-10-17: qty 15

## 2018-10-17 MED ORDER — OXYBUTYNIN CHLORIDE 5 MG PO TABS
5.0000 mg | ORAL_TABLET | Freq: Two times a day (BID) | ORAL | Status: DC
  Administered 2018-10-17 – 2018-10-22 (×10): 5 mg via ORAL
  Filled 2018-10-17 (×10): qty 1

## 2018-10-17 MED ORDER — LOPERAMIDE HCL 2 MG PO CAPS: 2.0000 mg | ORAL_CAPSULE | Freq: Four times a day (QID) | ORAL | Status: DC | PRN

## 2018-10-17 MED ORDER — LOSARTAN POTASSIUM 50 MG PO TABS
50.0000 mg | ORAL_TABLET | Freq: Every day | ORAL | Status: DC
Start: 2018-10-18 — End: 2018-10-18

## 2018-10-17 NOTE — ED Notes (Signed)
Condom cath placed per ED tech

## 2018-10-17 NOTE — ED Notes (Signed)
Pt's wife to lobby and car via [facility] per ED Tech

## 2018-10-17 NOTE — Progress Notes (Signed)
Pharmacy Antibiotic Note  [name] is a 77 y.o. male admitted on 10/17/2018 with UTI.  Pharmacy has been consulted for Cefepime dosing.  Plan: Cefepime 2 gram given in the ED Start Cefepime 1 gram IV q 24 hours  Monitor clinical progress, cultures/sensitivities, renal function, abx plan   Weight: 208 lb 5.4 oz (94.5 kg)  Temp (24hrs), Avg:97.3 F (36.3 C), Min:97.3 F (36.3 C), Max:97.3 F (36.3 C)  Recent Labs  Lab 10/17/18 1501 10/17/18 1606  WBC 6.4  --   CREATININE 3.07*  --   LATICACIDVEN  --  1.17    Estimated Creatinine Clearance: 24.5 mL/min (A) (by C-G formula based on SCr of 3.07 mg/dL (H)).    Allergies  Allergen Reactions  . Penicillins Other (See Comments)    Unknown reaction. TOLERATES CEFTRIAXONE  Has patient had a PCN reaction causing immediate rash, facial/tongue/throat swelling, SOB or lightheadedness with hypotension: Unknown Has patient had a PCN reaction causing severe rash involving mucus membranes or skin necrosis:  /Unknown Has patient had a PCN reaction that required hospitalization: Unknown Has patient had a PCN reaction occurring within the last 10 years: Unknown If all of the above answers are "NO", then may proceed with Cephalosporin u  . Sulfa Antibiotics Other (See Comments)    Unknown reaction    Antimicrobials this admission: 11/7 Cefepime >>   Dose adjustments this admission:  Microbiology results: n/a   Thank you for allowing [place] to participate in this patients care.   Jens Som, PharmD Please utilize Amion (under [place]) for appropriate number for your unit pharmacist. 10/17/2018 9:32 PM

## 2018-10-17 NOTE — ED Notes (Signed)
Patient transported to X-ray 

## 2018-10-17 NOTE — ED Provider Notes (Signed)
[facility] EMERGENCY DEPARTMENT Provider Note   CSN: [id number] Arrival date & time: 10/17/18  1432     History   Chief Complaint Chief Complaint  Patient presents with  . Abnormal Lab    HPI [name] is a 77 y.o. male.  HPI 77 year old male with history of CHF, hypertension, hyperlipidemia, CKD stage III, here with generalized weakness, confusion, and abnormal renal function.  History provided by patient's wife and patient.  Patient mildly confused.  He reports that over the last week, he has felt generally weak.  According to the wife, he has not been eating and drinking much, and was reportedly "sick" over the weekend.  He was confused, lethargic, and minimally responsive.  He was diagnosed with pneumonia 2 weeks ago and had been on antibiotics at his facility, which did seem to initially improve his symptoms.  He had his labs rechecked today and was told that his kidney function was significantly worse and is subsequently sent to the ED.  Currently, he states he feels generally fatigued.  He complains of dry mouth.  He complains of mild lower back pain after a fall during his previous hospitalization, but denies any new lower extremity numbness or weakness.  Of note, he is not amatory currently due to his generalized weakness.'  Level 5 caveat invoked as remainder of history, ROS, and physical exam limited due to patient's confusion.   Past Medical History:  Diagnosis Date  . CAP (community acquired pneumonia)   . CHF (congestive heart failure) ([place])   . Diabetes ([place])   . Hyperlipidemia   . Hypertension   . Hypotension   . Pleural effusion on right   . Prostate cancer ([place])   . Ulcers of both lower extremities ([place])   . Venous insufficiency (chronic) (peripheral)     Patient Active Problem List   Diagnosis Date Noted  . UTI (urinary tract infection) 10/17/2018  . CKD (chronic kidney disease) stage 3, GFR 30-59 ml/min (HCC) 08/20/2018  . Scrotal  edema 08/20/2018  . Stasis ulcer ([place])   . Cellulitis of left leg 08/13/2018  . Anemia 02/19/2018  . Dermatitis associated with moisture from urinary incontinence 02/19/2018  . Acute encephalopathy 02/18/2018  . Acute kidney injury superimposed on CKD ([place]) 02/18/2018  . Dehydration 02/18/2018  . Leukocytosis 02/18/2018  . Cellulitis of leg, right 02/18/2018  . Cellulitis of flank 02/18/2018  . Cellulitis of perineum 02/18/2018  . Cellulitis of buttock   . CAP (community acquired pneumonia) 12/06/2017  . Dyspnea 12/06/2017  . Acute heart failure ([place]) 12/06/2017  . Pleural effusion on right 12/06/2017  . Diabetes ([place])   . Open wound of knee, leg, and ankle 01/01/2011  . [place] SHOULDER REGION 11/23/2010  . Decubitus ulcer of heel 11/16/2010  . ADENOCARCINOMA, PROSTATE 09/13/2010  . Type 2 diabetes mellitus with hyperlipidemia ([place]) 09/13/2010  . Hyperlipidemia 09/13/2010  . OBESITY, MORBID 09/13/2010  . ANXIETY DEPRESSION 09/13/2010  . HYPERTENSION, BENIGN ESSENTIAL 09/13/2010  . VENOUS STASIS ULCER 09/13/2010  . Venous (peripheral) insufficiency 09/13/2010  . DEGENERATIVE JOINT DISEASE, HIPS 09/13/2010  . Backache 09/13/2010  . GAIT DISTURBANCE 09/13/2010  . INCONTINENCE, URGE 09/13/2010  . HIP REPLACEMENT, BILATERAL, HX OF 09/13/2010    History reviewed. No pertinent surgical history.      Home Medications    Prior to Admission medications   Medication Sig Start Date End Date Taking? Authorizing Provider  acetaminophen (TYLENOL) 500 MG tablet Take 1,000-1,500 mg by  mouth 2 (two) times daily as needed (leg pain).   Yes [provider]  aspirin EC 81 MG tablet Take 81 mg by mouth daily.   Yes [provider]  atorvastatin (LIPITOR) 80 MG tablet Take 80 mg by mouth daily.   Yes [provider]  buPROPion (BUDEPRION XL) 300 MG 24 hr tablet Take 300 mg by mouth daily.     Yes [provider]  diclofenac sodium  (VOLTAREN) 1 % GEL Apply 2 g topically 4 (four) times daily. Patient taking differently: Apply 2 g topically 3 (three) times daily.  08/20/18  Yes Debbe Odea, MD  escitalopram (LEXAPRO) 5 MG tablet Take 15 mg by mouth daily.    Yes [provider]  gabapentin (NEURONTIN) 400 MG capsule Take 1 capsule (400 mg total) by mouth 3 (three) times daily. 12/12/17  Yes Hongalgi, Lenis Dickinson, MD  loperamide (IMODIUM A-D) 2 MG tablet Take 2 mg by mouth 4 (four) times daily as needed for diarrhea or loose stools.   Yes [provider]  losartan (COZAAR) 50 MG tablet Take 50 mg by mouth daily. 06/11/18  Yes [provider]  menthol-cetylpyridinium (CEPACOL) 3 MG lozenge Take 1 lozenge (3 mg total) by mouth as needed for sore throat. 08/20/18  Yes Debbe Odea, MD  Multiple Vitamin (MULTIVITAMIN WITH MINERALS) TABS tablet Take 1 tablet by mouth daily. 08/20/18  Yes Debbe Odea, MD  nystatin (MYCOSTATIN/NYSTOP) powder Apply topically 4 (four) times daily. To groin. 08/09/18  Yes Mesner, Corene Cornea, MD  oxybutynin (DITROPAN) 5 MG tablet Take 5 mg by mouth 2 (two) times daily. 01/22/18  Yes [provider]  potassium chloride SA (K-DUR,KLOR-CON) 20 MEQ tablet Take 60 mEq by mouth daily. 10/17/18 10/19/18 Yes [provider]  Silodosin (RAPAFLO) 8 MG CAPS Take 8 mg by mouth daily.    Yes [provider]  traMADol (ULTRAM) 50 MG tablet Take 2 tablets (100 mg total) by mouth every 6 (six) hours as needed for severe pain. 08/20/18  Yes Debbe Odea, MD  traMADol (ULTRAM) 50 MG tablet Take 1 tablet (50 mg total) by mouth every 6 (six) hours as needed (mild pain). 08/20/18  Yes Debbe Odea, MD  vitamin C (VITAMIN C) 500 MG tablet Take 1 tablet (500 mg total) by mouth 2 (two) times daily. 08/20/18  Yes Debbe Odea, MD  loratadine (CLARITIN) 10 MG tablet Take 1 tablet (10 mg total) by mouth daily. Patient not taking: Reported on 10/17/2018 08/21/18   Debbe Odea, MD    neomycin-polymyxin b-dexamethasone (MAXITROL) 3.5-10000-0.1 SUSP Place 1 drop into both eyes every 6 (six) hours. Patient not taking: Reported on 10/17/2018 08/20/18   Debbe Odea, MD  nutrition supplement, JUVEN, (JUVEN) PACK Take 1 packet by mouth 2 (two) times daily between meals. Patient not taking: Reported on 10/17/2018 08/20/18   Debbe Odea, MD    Family History Family History  Problem Relation Age of Onset  . Coronary artery disease Father   . Coronary artery disease Mother   . Dementia Mother   . Coronary artery disease Sister     Social History Social History   Tobacco Use  . Smoking status: Never Smoker  . Smokeless tobacco: Never Used  Substance Use Topics  . Alcohol use: Yes    Alcohol/week: 1.0 standard drinks    Types: 1 Cans of beer per week    Frequency: Never  . Drug use: No     Allergies   Penicillins and Sulfa antibiotics  Review of Systems Review of Systems  Constitutional: Positive for fatigue. Negative for chills and fever.  HENT: Negative for congestion and rhinorrhea.   Eyes: Negative for visual disturbance.  Respiratory: Positive for cough. Negative for shortness of breath and wheezing.   Cardiovascular: Negative for chest pain and leg swelling.  Gastrointestinal: Negative for abdominal pain, diarrhea, nausea and vomiting.  Genitourinary: Negative for dysuria and flank pain.  Musculoskeletal: Positive for back pain. Negative for neck pain and neck stiffness.  Skin: Negative for rash and wound.  Allergic/Immunologic: Negative for immunocompromised state.  Neurological: Positive for weakness. Negative for syncope and headaches.  All other systems reviewed and are negative.    Physical Exam Updated Vital Signs BP (!) 150/67   Pulse (!) 57   Temp (!) 97.3 F (36.3 C) (Oral)   Resp 13   Wt 94.5 kg   SpO2 100%   BMI 26.39 kg/m   Physical Exam  Constitutional: He is oriented to person, place, and time. He appears well-developed  and well-nourished.  Ill-appearing, but non-toxic  HENT:  Head: Normocephalic and atraumatic.  Markedly dry MM  Eyes: Conjunctivae are normal.  Neck: Neck supple.  Cardiovascular: Normal rate, regular rhythm and normal heart sounds. Exam reveals no friction rub.  No murmur heard. Pulmonary/Chest: Effort normal. No respiratory distress. He has no wheezes. He has rhonchi in the right lower field and the left lower field. He has rales.  Abdominal: He exhibits no distension.  Musculoskeletal: He exhibits no edema.  Neurological: He is alert and oriented to person, place, and time. He exhibits normal muscle tone.  Skin: Skin is warm. Capillary refill takes 2 to 3 seconds.  Psychiatric: He has a normal mood and affect.  Nursing note and vitals reviewed.    ED Treatments / Results  Labs (all labs ordered are listed, but only abnormal results are displayed) Labs Reviewed  URINALYSIS, ROUTINE W REFLEX MICROSCOPIC - Abnormal; Notable for the following components:      Result Value   Color, Urine AMBER (*)    APPearance TURBID (*)    Hgb urine dipstick MODERATE (*)    Protein, ur 30 (*)    Leukocytes, UA LARGE (*)    WBC, UA >50 (*)    Bacteria, UA FEW (*)    All other components within normal limits  BASIC METABOLIC PANEL - Abnormal; Notable for the following components:   Potassium 3.2 (*)    CO2 17 (*)    Glucose, Bld 163 (*)    BUN 69 (*)    Creatinine, Ser 3.07 (*)    Calcium 8.4 (*)    GFR calc non Af Amer 18 (*)    GFR calc Af Amer 21 (*)    All other components within normal limits  CBC - Abnormal; Notable for the following components:   RBC 3.49 (*)    Hemoglobin 9.6 (*)    HCT 33.5 (*)    MCHC 28.7 (*)    RDW 16.1 (*)    All other components within normal limits  MRSA PCR SCREENING  MAGNESIUM  COMPREHENSIVE METABOLIC PANEL  CBC  I-STAT CG4 LACTIC ACID, ED  I-STAT CG4 LACTIC ACID, ED    EKG EKG Interpretation  Date/Time:  Thursday October 17 2018 14:47:21  EST Ventricular Rate:  54 PR Interval:    QRS Duration: 188 QT Interval:  529 QTC Calculation: 502 R Axis:   -95 Text Interpretation:  Sinus rhythm Prolonged PR interval RBBB and LAFB No  significant change since last tracing Confirmed by Duffy Bruce [phone]) on 10/17/2018 4:07:36 PM   Radiology Dg Chest 2 View  Result Date: 10/17/2018 CLINICAL DATA:  Weakness, recent fall, low back and sacral pain EXAM: CHEST - 2 VIEW COMPARISON:  Portable chest x-ray of 08/18/2017 FINDINGS: There is opacity at the right lung base consistent with atelectasis versus developing pneumonia. The left lung appears clear. Mediastinal and hilar contours are unremarkable and cardiomegaly is stable. There are degenerative changes throughout the thoracic spine and within the shoulders. IMPRESSION: 1. Linear atelectasis or developing pneumonia at the right lung base. 2. Stable cardiomegaly. Electronically Signed   By: Ivar Drape M.D.   On: 10/17/2018 17:25   Dg Lumbar Spine Complete  Result Date: 10/17/2018 CLINICAL DATA:  Recent fall, low back pain, weakness EXAM: LUMBAR SPINE - COMPLETE 4+ VIEW COMPARISON:  CT abdomen pelvis of 12/09/2009. FINDINGS: There is a lumbar scoliosis convex to the left by approximately 32 degrees. Significant degenerative change is present with osteophytes from L3-S1. No acute compression deformity is seen. The SI joints are corticated. There is significant degenerative joint disease involving both hips as well. IMPRESSION: 1. Lumbar scoliosis convex to the left with diffuse degenerative spurring and degenerative disc disease. No acute compression deformity. 2. Degenerative change of both hips. Electronically Signed   By: Ivar Drape M.D.   On: 10/17/2018 17:21   Dg Sacrum/coccyx  Result Date: 10/17/2018 CLINICAL DATA:  Increasing weakness with recent fall, low back and sacral pain EXAM: SACRUM AND COCCYX - 2+ VIEW COMPARISON:  None. FINDINGS: The sacrococcygeal elements are in normal  alignment. The bones are somewhat osteopenic. No acute fracture is seen. The pelvic rami appear intact and the SI joints are corticated. The sacral foramina appear corticated. There is significant degenerative joint disease of the hips, right greater than left. IMPRESSION: 1. No acute fracture.  Diffuse osteopenia. 2. Significant degenerative joint disease of the hips, right greater than left. Electronically Signed   By: Ivar Drape M.D.   On: 10/17/2018 17:22    Procedures Procedures (including critical care time)  Medications Ordered in ED Medications  buPROPion (WELLBUTRIN XL) 24 hr tablet 300 mg (has no administration in time range)  escitalopram (LEXAPRO) tablet 15 mg (has no administration in time range)  tamsulosin (FLOMAX) capsule 0.4 mg (has no administration in time range)  gabapentin (NEURONTIN) capsule 400 mg (400 mg Oral Given 10/17/18 2258)  oxybutynin (DITROPAN) tablet 5 mg (5 mg Oral Given 10/17/18 2259)  losartan (COZAAR) tablet 50 mg (has no administration in time range)  aspirin EC tablet 81 mg (has no administration in time range)  nystatin (MYCOSTATIN/NYSTOP) topical powder ( Topical Given 10/17/18 2259)  atorvastatin (LIPITOR) tablet 80 mg (has no administration in time range)  acetaminophen (TYLENOL) tablet 1,000 mg (has no administration in time range)  traMADol (ULTRAM) tablet 50 mg (has no administration in time range)  diclofenac sodium (VOLTAREN) 1 % transdermal gel 2 g (2 g Topical Given 10/17/18 2259)  menthol-cetylpyridinium (CEPACOL) lozenge 3 mg (has no administration in time range)  multivitamin with minerals tablet 1 tablet (has no administration in time range)  vitamin C (ASCORBIC ACID) tablet 500 mg (500 mg Oral Given 10/17/18 2259)  loperamide (IMODIUM) capsule 2 mg (has no administration in time range)  potassium chloride SA (K-DUR,KLOR-CON) CR tablet 60 mEq (has no administration in time range)  heparin injection 5,000 Units (5,000 Units Subcutaneous Given  10/17/18 2259)  0.45 % sodium chloride infusion ( Intravenous  Rate/Dose Verify 10/17/18 2300)  ondansetron (ZOFRAN) tablet 4 mg (has no administration in time range)    Or  ondansetron (ZOFRAN) injection 4 mg (has no administration in time range)  ceFEPIme (MAXIPIME) 1 g in sodium chloride 0.9 % 100 mL IVPB (has no administration in time range)  sodium chloride 0.9 % bolus 1,000 mL (0 mLs Intravenous Stopped 10/17/18 1738)  ceFEPIme (MAXIPIME) 2 g in sodium chloride 0.9 % 100 mL IVPB (0 g Intravenous Stopped 10/17/18 2044)     Initial Impression / Assessment and Plan / ED Course  I have reviewed the triage vital signs and the nursing notes.  Pertinent labs & imaging results that were available during my care of the patient were reviewed by me and considered in my medical decision making (see chart for details).     76 year old male here with generalized weakness and worsening renal function.  Today, he appears markedly dehydrated clinically.  He has mildly confused.  This is above his baseline.  Lab work and imaging reviewed, and is consistent with likely acute on chronic kidney injury as well as possible UTI.  His chest x-ray is read as a pneumonia, but he reportedly was just recently treated for this, and he is not hypoxic, tachypneic, and I have a lower suspicion for an acute pneumonia.  Will give a dose of cefepime, for his UTI as well as possibility of pneumonia, though suspect this can be narrowed pending his clinical response.  Will admit to medicine.  Final Clinical Impressions(s) / ED Diagnoses   Final diagnoses:  Acute renal failure superimposed on stage 3 chronic kidney disease, unspecified acute renal failure type ([place])  Acute cystitis without hematuria    ED Discharge Orders    None       Duffy Bruce, MD 10/18/18 0031

## 2018-10-17 NOTE — H&P (Signed)
History and Physical   TAL KEMPKER WUJ:[id number] DOB: Nov 09, 1941 DOA: 10/17/2018  Referring MD/NP/PA: Dr. Ellender Hose  PCP: Kathyrn Lass, MD   Patient coming from: Skilled nursing facility  Chief Complaint: Confusion and abnormal left  HPI: [name] is a 77 y.o. male with medical history significant of hypertension, chronic kidney disease stage III, hyperlipidemia, diastolic CHF, recent pneumonia, recent pleural effusion, bilateral lower extremity venous stasis ulcers who was brought in from the skilled facility due to increasing confusion and weakness.  Patient has been feeling sick for the last few days and has not been eating or drinking.  Wife noted that he was confused and weaker and brought him to the ER.  He was treated for pneumonia just 2 weeks ago.  He has been on antibiotics but no reported diarrhea.  He has however completed antibiotics.  Blood work today at the facility showed worsening renal function.  They sent him to the ER for evaluation.  Patient is awake but drowsy.  Communicating on responding to questions but overall weak.  He is hypotensive.  Work-up indicated possible UTI but pneumonia appears to have resolved.  He is being admitted for treatment..  ED Course: Initial temperature is 97.3 with blood pressure 149/65, pulse of 57, respiratory rate of 18, oxygen sat 99% room air.  White count is 6.4, hemoglobin 9.6, platelet 297.  Sodium is 138 with potassium 3.2, chloride 110 and CO2 17 BUN 69 creatinine 3.037.  Calcium is 8.4 glucose 163.  Urinalysis showed turbid urine with moderate glucose.  Leukocytes are large nitrite is negative.  Few bacteria with WBC clumps.  X-rays of the chest lumbar spine and sacrum were all negative.  Lactic acid level is 1.17.  Patient suspected to have significant UTI causing his current symptoms.  Review of Systems: As per HPI otherwise 10 point review of systems negative.    Past Medical History:  Diagnosis Date  . CAP (community acquired  pneumonia)   . CHF (congestive heart failure) ([place])   . Diabetes ([place])   . Hyperlipidemia   . Hypertension   . Hypotension   . Pleural effusion on right   . Prostate cancer ([place])   . Ulcers of both lower extremities ([place])   . Venous insufficiency (chronic) (peripheral)     History reviewed. No pertinent surgical history.   reports that he has never smoked. He has never used smokeless tobacco. He reports that he drinks about 1.0 standard drinks of alcohol per week. He reports that he does not use drugs.  Allergies  Allergen Reactions  . Penicillins Other (See Comments)    Unknown reaction. TOLERATES CEFTRIAXONE  Has patient had a PCN reaction causing immediate rash, facial/tongue/throat swelling, SOB or lightheadedness with hypotension: Unknown Has patient had a PCN reaction causing severe rash involving mucus membranes or skin necrosis:  /Unknown Has patient had a PCN reaction that required hospitalization: Unknown Has patient had a PCN reaction occurring within the last 10 years: Unknown If all of the above answers are "NO", then may proceed with Cephalosporin u  . Sulfa Antibiotics Other (See Comments)    Unknown reaction    Family History  Problem Relation Age of Onset  . Coronary artery disease Father   . Coronary artery disease Mother   . Dementia Mother   . Coronary artery disease Sister      Prior to Admission medications   Medication Sig Start Date End Date Taking? Authorizing Provider  acetaminophen (TYLENOL) 500 MG tablet  Take 1,000-1,500 mg by mouth 2 (two) times daily as needed (leg pain).   Yes [provider]  aspirin EC 81 MG tablet Take 81 mg by mouth daily.   Yes [provider]  atorvastatin (LIPITOR) 80 MG tablet Take 80 mg by mouth daily.   Yes [provider]  buPROPion (BUDEPRION XL) 300 MG 24 hr tablet Take 300 mg by mouth daily.     Yes [provider]  diclofenac sodium (VOLTAREN) 1 % GEL Apply 2 g topically 4  (four) times daily. Patient taking differently: Apply 2 g topically 3 (three) times daily.  08/20/18  Yes Debbe Odea, MD  escitalopram (LEXAPRO) 5 MG tablet Take 15 mg by mouth daily.    Yes [provider]  gabapentin (NEURONTIN) 400 MG capsule Take 1 capsule (400 mg total) by mouth 3 (three) times daily. 12/12/17  Yes Hongalgi, Lenis Dickinson, MD  loperamide (IMODIUM A-D) 2 MG tablet Take 2 mg by mouth 4 (four) times daily as needed for diarrhea or loose stools.   Yes [provider]  losartan (COZAAR) 50 MG tablet Take 50 mg by mouth daily. 06/11/18  Yes [provider]  menthol-cetylpyridinium (CEPACOL) 3 MG lozenge Take 1 lozenge (3 mg total) by mouth as needed for sore throat. 08/20/18  Yes Debbe Odea, MD  Multiple Vitamin (MULTIVITAMIN WITH MINERALS) TABS tablet Take 1 tablet by mouth daily. 08/20/18  Yes Debbe Odea, MD  nystatin (MYCOSTATIN/NYSTOP) powder Apply topically 4 (four) times daily. To groin. 08/09/18  Yes Mesner, Corene Cornea, MD  oxybutynin (DITROPAN) 5 MG tablet Take 5 mg by mouth 2 (two) times daily. 01/22/18  Yes [provider]  potassium chloride SA (K-DUR,KLOR-CON) 20 MEQ tablet Take 60 mEq by mouth daily. 10/17/18 10/19/18 Yes [provider]  Silodosin (RAPAFLO) 8 MG CAPS Take 8 mg by mouth daily.    Yes [provider]  traMADol (ULTRAM) 50 MG tablet Take 2 tablets (100 mg total) by mouth every 6 (six) hours as needed for severe pain. 08/20/18  Yes Debbe Odea, MD  traMADol (ULTRAM) 50 MG tablet Take 1 tablet (50 mg total) by mouth every 6 (six) hours as needed (mild pain). 08/20/18  Yes Debbe Odea, MD  vitamin C (VITAMIN C) 500 MG tablet Take 1 tablet (500 mg total) by mouth 2 (two) times daily. 08/20/18  Yes Debbe Odea, MD  loratadine (CLARITIN) 10 MG tablet Take 1 tablet (10 mg total) by mouth daily. Patient not taking: Reported on 10/17/2018 08/21/18   Debbe Odea, MD  neomycin-polymyxin b-dexamethasone (MAXITROL)  3.5-10000-0.1 SUSP Place 1 drop into both eyes every 6 (six) hours. Patient not taking: Reported on 10/17/2018 08/20/18   Debbe Odea, MD  nutrition supplement, JUVEN, (JUVEN) PACK Take 1 packet by mouth 2 (two) times daily between meals. Patient not taking: Reported on 10/17/2018 08/20/18   Debbe Odea, MD    Physical Exam: Vitals:   10/17/18 1815 10/17/18 1830 10/17/18 1845 10/17/18 2052  BP: (!) 142/67 120/68 (!) 143/60   Pulse: (!) 50 (!) 50 (!) 53   Resp: 11 12 11    Temp:      TempSrc:      SpO2: 100% 100% 100%   Weight:    94.5 kg      Constitutional: NAD, calm, comfortable, chronically ill looking and appears dehydrated Vitals:   10/17/18 1815 10/17/18 1830 10/17/18 1845 10/17/18 2052  BP: (!) 142/67 120/68 (!) 143/60   Pulse: (!) 50 (!) 50 (!) 53  Resp: 11 12 11    Temp:      TempSrc:      SpO2: 100% 100% 100%   Weight:    94.5 kg   Eyes: PERRL, lids and conjunctivae normal ENMT: Mucous membranes are dry. Posterior pharynx clear of any exudate or lesions.Normal dentition.  Neck: normal, supple, no masses, no thyromegaly Respiratory: clear to auscultation bilaterally, no wheezing, no crackles. Normal respiratory effort. No accessory muscle use.  Cardiovascular: Regular rate and rhythm, no murmurs / rubs / gallops. No extremity edema. 2+ pedal pulses. No carotid bruits.  Abdomen: no tenderness, no masses palpated. No hepatosplenomegaly. Bowel sounds positive.  Musculoskeletal: no clubbing / cyanosis. No joint deformity upper and lower extremities. Good ROM, no contractures. Normal muscle tone.  Skin: no rashes, lesions, ulcers. No induration Neurologic: CN 2-12 grossly intact. Sensation intact, DTR normal. Strength 5/5 in all 4.  Psychiatric: Drowsy and weak    Labs on Admission: I have personally reviewed following labs and imaging studies  CBC: Recent Labs  Lab 10/17/18 1501  WBC 6.4  HGB 9.6*  HCT 33.5*  MCV 96.0  PLT 229   Basic Metabolic  Panel: Recent Labs  Lab 10/17/18 1501  NA 138  K 3.2*  CL 110  CO2 17*  GLUCOSE 163*  BUN 69*  CREATININE 3.07*  CALCIUM 8.4*  MG 2.0   GFR: Estimated Creatinine Clearance: 24.5 mL/min (A) (by C-G formula based on SCr of 3.07 mg/dL (H)). Liver Function Tests: No results for input(s): AST, ALT, ALKPHOS, BILITOT, PROT, ALBUMIN in the last 168 hours. No results for input(s): LIPASE, AMYLASE in the last 168 hours. No results for input(s): AMMONIA in the last 168 hours. Coagulation Profile: No results for input(s): INR, PROTIME in the last 168 hours. Cardiac Enzymes: No results for input(s): CKTOTAL, CKMB, CKMBINDEX, TROPONINI in the last 168 hours. BNP (last 3 results) No results for input(s): PROBNP in the last 8760 hours. HbA1C: No results for input(s): HGBA1C in the last 72 hours. CBG: No results for input(s): GLUCAP in the last 168 hours. Lipid Profile: No results for input(s): CHOL, HDL, LDLCALC, TRIG, CHOLHDL, LDLDIRECT in the last 72 hours. Thyroid Function Tests: No results for input(s): TSH, T4TOTAL, FREET4, T3FREE, THYROIDAB in the last 72 hours. Anemia Panel: No results for input(s): VITAMINB12, FOLATE, FERRITIN, TIBC, IRON, RETICCTPCT in the last 72 hours. Urine analysis:    Component Value Date/Time   COLORURINE AMBER (A) 10/17/2018 1632   APPEARANCEUR TURBID (A) 10/17/2018 1632   LABSPEC 1.010 10/17/2018 1632   PHURINE 5.0 10/17/2018 1632   GLUCOSEU NEGATIVE 10/17/2018 1632   HGBUR MODERATE (A) 10/17/2018 1632   BILIRUBINUR NEGATIVE 10/17/2018 1632   KETONESUR NEGATIVE 10/17/2018 1632   PROTEINUR 30 (A) 10/17/2018 1632   UROBILINOGEN 0.2 06/09/2011 0522   NITRITE NEGATIVE 10/17/2018 1632   LEUKOCYTESUR LARGE (A) 10/17/2018 1632   Sepsis Labs: @LABRCNTIP (procalcitonin:4,lacticidven:4) )No results found for this or any previous visit (from the past 240 hour(s)).   Radiological Exams on Admission: Dg Chest 2 View  Result Date: 10/17/2018 CLINICAL  DATA:  Weakness, recent fall, low back and sacral pain EXAM: CHEST - 2 VIEW COMPARISON:  Portable chest x-ray of 08/18/2017 FINDINGS: There is opacity at the right lung base consistent with atelectasis versus developing pneumonia. The left lung appears clear. Mediastinal and hilar contours are unremarkable and cardiomegaly is stable. There are degenerative changes throughout the thoracic spine and within the shoulders. IMPRESSION: 1. Linear atelectasis or developing pneumonia at the right  lung base. 2. Stable cardiomegaly. Electronically Signed   By: Ivar Drape M.D.   On: 10/17/2018 17:25   Dg Lumbar Spine Complete  Result Date: 10/17/2018 CLINICAL DATA:  Recent fall, low back pain, weakness EXAM: LUMBAR SPINE - COMPLETE 4+ VIEW COMPARISON:  CT abdomen pelvis of 12/09/2009. FINDINGS: There is a lumbar scoliosis convex to the left by approximately 32 degrees. Significant degenerative change is present with osteophytes from L3-S1. No acute compression deformity is seen. The SI joints are corticated. There is significant degenerative joint disease involving both hips as well. IMPRESSION: 1. Lumbar scoliosis convex to the left with diffuse degenerative spurring and degenerative disc disease. No acute compression deformity. 2. Degenerative change of both hips. Electronically Signed   By: Ivar Drape M.D.   On: 10/17/2018 17:21   Dg Sacrum/coccyx  Result Date: 10/17/2018 CLINICAL DATA:  Increasing weakness with recent fall, low back and sacral pain EXAM: SACRUM AND COCCYX - 2+ VIEW COMPARISON:  None. FINDINGS: The sacrococcygeal elements are in normal alignment. The bones are somewhat osteopenic. No acute fracture is seen. The pelvic rami appear intact and the SI joints are corticated. The sacral foramina appear corticated. There is significant degenerative joint disease of the hips, right greater than left. IMPRESSION: 1. No acute fracture.  Diffuse osteopenia. 2. Significant degenerative joint disease of the  hips, right greater than left. Electronically Signed   By: Ivar Drape M.D.   On: 10/17/2018 17:22    EKG: Independently reviewed.  It showed sinus rhythm with a rate of 19.  Right bundle branch block and left anterior fascicular block which are chronic.  Assessment/Plan Principal Problem:   UTI (urinary tract infection) Active Problems:   Type 2 diabetes mellitus with hyperlipidemia (HCC)   OBESITY, MORBID   Venous (peripheral) insufficiency   Open wound of knee, leg, and ankle   Acute encephalopathy   Cellulitis of leg, right   Cellulitis of left leg   CKD (chronic kidney disease) stage 3, GFR 30-59 ml/min (HCC)     #1 urinary tract infection: Appears to be cause of patient's initial symptoms.  Initiate IV Rocephin while waiting for urine culture and sensitivity results as well as blood cultures.  Hydrate patient and monitor closely.  #2 dehydration: Again hydrate the patient gently.  #3 acute kidney injury: Creatinine was 2.08 before now is more than 3.  Most likely prerenal.  Hydrate the patient and monitor closely.  #4 type 2 diabetes: Continue blood sugar control.  Add sliding scale insulin.  #5 bilateral venous stasis ulcers: Currently his legs are wrapped.  Elevate the feet and continue monitoring closely.  #6 chronic kidney disease stage III: Goal is to get patient back to baseline.  #7 hypokalemia: Replete potassium.   DVT prophylaxis: Heparin Code Status: Full code Family Communication: Wife Disposition Plan: Back to skilled facility Consults called: None Admission status: Inpatient  Severity of Illness: The appropriate patient status for this patient is INPATIENT. Inpatient status is judged to be reasonable and necessary in order to provide the required intensity of service to ensure the patient's safety. The patient's presenting symptoms, physical exam findings, and initial radiographic and laboratory data in the context of their chronic comorbidities is felt  to place them at high risk for further clinical deterioration. Furthermore, it is not anticipated that the patient will be medically stable for discharge from the hospital within 2 midnights of admission. The following factors support the patient status of inpatient.   " The patient's  presenting symptoms include confusion and weakness. " The worrisome physical exam findings include patient appears dehydrated and weak. " The initial radiographic and laboratory data are worrisome because of worsening renal function. " The chronic co-morbidities include diabetes and UTI.   * I certify that at the point of admission it is my clinical judgment that the patient will require inpatient hospital care spanning beyond 2 midnights from the point of admission due to high intensity of service, high risk for further deterioration and high frequency of surveillance required.Barbette Merino MD [facility] Pager [phone]  If 7PM-7AM, please contact night-coverage [email or web address] Password [email or web address]  10/17/2018, 9:20 PM

## 2018-10-17 NOTE — ED Triage Notes (Signed)
Pt here from Nursing home with c/o abnormal labs , increased kidney function ,

## 2018-10-17 NOTE — ED Notes (Signed)
Attempted to call report at this time.  Receiving RN still in report and will call back

## 2018-10-18 ENCOUNTER — Other Ambulatory Visit: Payer: Self-pay

## 2018-10-18 DIAGNOSIS — L8990 Pressure ulcer of unspecified site, unspecified stage: Secondary | ICD-10-CM

## 2018-10-18 DIAGNOSIS — N342 Other urethritis: Secondary | ICD-10-CM

## 2018-10-18 LAB — COMPREHENSIVE METABOLIC PANEL WITH GFR
ALT: 24 U/L (ref 0–44)
AST: 35 U/L (ref 15–41)
Albumin: 2.3 g/dL — ABNORMAL LOW (ref 3.5–5.0)
Alkaline Phosphatase: 94 U/L (ref 38–126)
Anion gap: 8 (ref 5–15)
BUN: 65 mg/dL — ABNORMAL HIGH (ref 8–23)
CO2: 18 mmol/L — ABNORMAL LOW (ref 22–32)
Calcium: 8.6 mg/dL — ABNORMAL LOW (ref 8.9–10.3)
Chloride: 116 mmol/L — ABNORMAL HIGH (ref 98–111)
Creatinine, Ser: 2.98 mg/dL — ABNORMAL HIGH (ref 0.61–1.24)
GFR calc Af Amer: 22 mL/min — ABNORMAL LOW
GFR calc non Af Amer: 19 mL/min — ABNORMAL LOW
Glucose, Bld: 103 mg/dL — ABNORMAL HIGH (ref 70–99)
Potassium: 2.9 mmol/L — ABNORMAL LOW (ref 3.5–5.1)
Sodium: 142 mmol/L (ref 135–145)
Total Bilirubin: 0.6 mg/dL (ref 0.3–1.2)
Total Protein: 7.1 g/dL (ref 6.5–8.1)

## 2018-10-18 LAB — MRSA PCR SCREENING: MRSA by PCR: NEGATIVE

## 2018-10-18 LAB — CBC
HCT: 34.1 % — ABNORMAL LOW (ref 39.0–52.0)
Hemoglobin: 10.2 g/dL — ABNORMAL LOW (ref 13.0–17.0)
MCH: 28.3 pg (ref 26.0–34.0)
MCHC: 29.9 g/dL — ABNORMAL LOW (ref 30.0–36.0)
MCV: 94.5 fL (ref 80.0–100.0)
Platelets: 280 10*3/uL (ref 150–400)
RBC: 3.61 MIL/uL — ABNORMAL LOW (ref 4.22–5.81)
RDW: 16 % — ABNORMAL HIGH (ref 11.5–15.5)
WBC: 6 10*3/uL (ref 4.0–10.5)
nRBC: 0 % (ref 0.0–0.2)

## 2018-10-18 LAB — MAGNESIUM: Magnesium: 2 mg/dL (ref 1.7–2.4)

## 2018-10-18 MED ORDER — POTASSIUM CHLORIDE CRYS ER 20 MEQ PO TBCR
40.0000 meq | EXTENDED_RELEASE_TABLET | Freq: Two times a day (BID) | ORAL | Status: DC
  Administered 2018-10-18 – 2018-10-22 (×9): 40 meq via ORAL
  Filled 2018-10-18 (×10): qty 2

## 2018-10-18 NOTE — Care Management Note (Signed)
Case Management Note  Patient Details  Name: [name] MRN: [id number] Date of Birth: [birth year]  Subjective/Objective: Pt presented for weakness and increasing confusion. PTA from [facility] is following for disposition needs.                    Action/Plan: CM will continue to monitor for any additional disposition needs.   Expected Discharge Date:                  Expected Discharge Plan:  Skilled Nursing Facility  In-House Referral:  Clinical Social Work  Discharge planning Services  CM Consult  Post Acute Care Choice:  NA Choice offered to:  NA  DME Arranged:  N/A DME Agency:  NA  HH Arranged:  NA HH Agency:  NA  Status of Service:  In process, will continue to follow  If discussed at Long Length of Stay Meetings, dates discussed:    Additional Comments:  Bethena Roys, RN 10/18/2018, 2:49 PM

## 2018-10-18 NOTE — Progress Notes (Signed)
Orthopedic Tech Progress Note Patient Details:  [name] 1941-02-09 [id number]  Ortho Devices Type of Ortho Device: Haematologist Ortho Device/Splint Interventions: Application   Post Interventions Patient Tolerated: Well Instructions Provided: Care of device, Adjustment of device   [name] 10/18/2018, 1:11 PM

## 2018-10-18 NOTE — Clinical Social Work Note (Signed)
Clinical Social Work Assessment  Patient Details  Name: CAIN FITZHENRY MRN: [id number] Date of Birth: 08/27/1941  Date of referral:  10/18/18               Reason for consult:  Discharge Planning                Permission sought to share information with:  Family Supports Permission granted to share information::  No(Patient was asleep during visit. CSW talked with patient's wife and son.)  Name::        Agency::     Relationship::     Contact Information:     Housing/Transportation Living arrangements for the past 2 months:  Skilled Nursing Facility(Patient from [facility]) Source of Information:  Spouse, Other (Comment Required)(CSW reviewed patient's chart) Patient Interpreter Needed:  None Criminal Activity/Legal Involvement Pertinent to Current Situation/Hospitalization:  No - Comment as needed Significant Relationships:    Lives with:  Facility Resident(Patient currently at [facility] for [place] rehab, bur originally came from home with his wife.) Do you feel safe going back to the place where you live?  Yes Need for family participation in patient care:  Yes (Comment)  Care giving concerns:  Wife, Carmin Dibartolo [phone]) and son Aaron Edelman expressed no concerns regarding patient's care at skilled nursing facility where patient is currently receiving rehab.  Social Worker assessment / plan:  CSW talked with Mrs. Owens Shark and son Aaron Edelman at the bedside. Mr. Owens Shark was in bed and slept through CSW's visit. Wife confirmed that patient came from [facility] and will return there at discharge to continue rehab. CSW was advised that they are holding patient's bed at the facility and wife reported that after patient's 100 Medicare day, VA benefits will kick in. CSW advised by son Aaron Edelman that he and his wife and 2 children (also at bedside) live in [place], and his older brother Erasmo Downer and his family lives in [place].  Employment status:  Retired Forensic scientist:  Medicare, Catering manager, [place] Benefit(Patient has [employer], Biochemist, clinical) PT Recommendations:  Not assessed at this time Information / Referral to community resources:  Other (Comment Required)(None needed or requested as patient from a facility and will return at discharge )  Patient/Family's Response to care: Wife and son expressed no concerns regarding patient's care during hospitalization.  Patient/Family's Understanding of and Emotional Response to Diagnosis, Current Treatment, and Prognosis:  Wife reported that the facility wanted to send patient to the hospital a couple of days before he came and patient refused. Wife and son appeared to understand the necessity of patient's hospitalization and continued rehab at discharge.  Emotional Assessment Appearance:  Appears stated age Attitude/Demeanor/Rapport:  Unable to Assess(Patient asleep during visit) Affect (typically observed):  Unable to Assess Orientation:  Oriented to Self, Oriented to Place, Oriented to  Time, Oriented to Situation Alcohol / Substance use:  Tobacco Use, Alcohol Use, Illicit Drugs(Patient reported that he has never smoked, drinks 1 can of beer per week, and does not use illicit drugs) Psych involvement (Current and /or in the community):  No (Comment)  Discharge Needs  Concerns to be addressed:  Discharge Planning Concerns Readmission within the last 30 days:  No Current discharge risk:  None Barriers to Discharge:  Continued Medical Work up   [employer] Mila Homer, [place] 10/18/2018, 5:52 PM

## 2018-10-18 NOTE — Progress Notes (Signed)
[facility] PROGRESS NOTE  TORRION WITTER HYI:[id number] DOB: [birth year] DOA: 10/17/2018 PCP: Kathyrn Lass, MD   Narrative: 77 year old skilled nursing facility resident Diabetes mellitus comp located by neuropathy, nephropathy, chronic kidney disease stage III, HLD, HTN, hyperlipidemia, bipolar, osteoarthritis right knee, BMI 26, chronic diastolic heart failure-last echo EF 60 to 65% 11/29/2017, prostate cancer status post XRT, prior C. difficile 06/16/2011, chronic lymphedema previously followed by wound care 2008 Recent admission 9/20-9/10 after being seen in the ED 8/30 for cellulitis sent home on clindamycin blood culture showed gram-positive cocci significant for Alloiococcus coccus otitis he was transitioned to oral antibiotics and sent back to skilled facility  Return from skilled facility because of worsening renal function and metabolic encephalopathy with drowsiness which is new as well as hypotension  --tells me has been placed on numerous new medication recently including " a medication for my kidney"  Initial work-up showed WBC 6.3 hemoglobin 9 BUN/creatinine six 9/3.0 up from baseline of 73/2.1 the potassium was also low and 3.2 range Admitted and being treated for possible urinary infection  A & Plan Toxic encephalopathy non admit-slowed speech and slurred?-unclear if iatrogenic from meds accumulating in a setting of AKI or infectious-improved-continue saline at 50 cc/h Possible urinary infection-Ua large leuk, neg nitrites-no UC drawn on admit-empirically rx with Cefepime ending on 11/10-rpt CBC in am Recently treated pneumonia--has a little bit of a sore throat-monitor  volume depletion secondary to acute kidney injury-creat eevate don admit-improved on saline-cont saline 100 cc/h CKD 3 with diabetic nephropathy underlying-see above discussion Chronic bilateral venous stasis since age 26-has a foot wound on R leg which I visualized-has UNNA boot on the LLE as well-will ask  [employer] to assess and change [usually changes q 7 d] Chronic diastolic heart failure EF 60-65% 11/2017-euvolemic at this time HTN-losartan from PTA held-follow trends and add medications if prn in next 24-48 h HLD Moderate to severe hypokalemia-profound-received runs of K-adding Kdur Prostate cancer status post XRT-follow as OP-continue FLomax as sub for Rapaflo and follow as OP-continue oxybutynin 5 bid Bipolar-reumed LExarpor 15 daily, Bupropion xl 300 qd  SAcral decubitius stag 1-trun freq  DVT prophylaxis: heparin  Code Status: presumed full   Family Communication:  none   Disposition Plan: ip for now--back to skilled 24-48 h    Verlon Au, MD  [facility] Direct contact: [phone] --Via amion app OR  --[email or web address]; password [email or web address]  7PM-7AM contact night coverage as above 10/18/2018, 7:42 AM  LOS: 1 day   Consultants:  n  Procedures:  n  Antimicrobials:  cefepime  Interval history/Subjective: Awake alert coherent in nad No distress tol breakfast without issue No coug No fever  Objective:  Vitals:  Vitals:   10/17/18 2000 10/18/18 0457  BP: (!) 150/67 (!) 119/48  Pulse: (!) 57 (!) 54  Resp: 13 18  Temp:  (!) 97.4 F (36.3 C)  SpO2: 100% 100%    Exam:  . Awake alert pleasant looks about stated age in nad . s1 s 2no m . ctab no adde dsoudn . abd soft nt nd no rebound no guard . No LE edema on R leg--does have a small ulceration with minimal slough . LL does have Unna boot . Neuro intact move 4 limbs equally-no deficit to power smile symm   I have personally reviewed the following:   Labs:  Sodium 142 potassium 2.9 down from 3.2  CO2 18  BUN/creatinine down from 69/3 0.0-60 5/2.9  WBC 6.0  Hemoglobin 10.2  Imaging studies:  X-rays of lumbar sacral spine showed no evidence of fracture  Chest x-ray two-view showed linear atelectasis right lower lung  Medical tests:  n   Test discussed with performing  physician:  n  Decision to obtain old records:  n  Review and summation of old records:  n  Scheduled Meds: . aspirin EC  81 mg Oral Daily  . atorvastatin  80 mg Oral Daily  . buPROPion  300 mg Oral Daily  . diclofenac sodium  2 g Topical TID  . escitalopram  15 mg Oral Daily  . gabapentin  400 mg Oral TID  . heparin  5,000 Units Subcutaneous Q8H  . multivitamin with minerals  1 tablet Oral Daily  . nystatin   Topical QID  . oxybutynin  5 mg Oral BID  . potassium chloride SA  40 mEq Oral BID  . tamsulosin  0.4 mg Oral Daily  . ascorbic acid  500 mg Oral BID   Continuous Infusions: . sodium chloride 100 mL/hr at 10/17/18 2300  . ceFEPime (MAXIPIME) IV      Principal Problem:   UTI (urinary tract infection) Active Problems:   Type 2 diabetes mellitus with hyperlipidemia (HCC)   OBESITY, MORBID   Venous (peripheral) insufficiency   Open wound of knee, leg, and ankle   Acute encephalopathy   Acute kidney injury superimposed on CKD (HCC)   Cellulitis of leg, right   Cellulitis of left leg   CKD (chronic kidney disease) stage 3, GFR 30-59 ml/min (HCC)   LOS: 1 day

## 2018-10-19 LAB — CBC WITH DIFFERENTIAL/PLATELET
Abs Immature Granulocytes: 0.03 10*3/uL (ref 0.00–0.07)
BASOS ABS: 0.1 10*3/uL (ref 0.0–0.1)
BASOS PCT: 1 %
EOS ABS: 0.2 10*3/uL (ref 0.0–0.5)
Eosinophils Relative: 3 %
HCT: 30.1 % — ABNORMAL LOW (ref 39.0–52.0)
Hemoglobin: 9.1 g/dL — ABNORMAL LOW (ref 13.0–17.0)
IMMATURE GRANULOCYTES: 0 %
LYMPHS ABS: 0.7 10*3/uL (ref 0.7–4.0)
Lymphocytes Relative: 10 %
MCH: 28.5 pg (ref 26.0–34.0)
MCHC: 30.2 g/dL (ref 30.0–36.0)
MCV: 94.4 fL (ref 80.0–100.0)
Monocytes Absolute: 0.5 10*3/uL (ref 0.1–1.0)
Monocytes Relative: 7 %
NEUTROS PCT: 79 %
NRBC: 0 % (ref 0.0–0.2)
Neutro Abs: 5.8 10*3/uL (ref 1.7–7.7)
PLATELETS: 288 10*3/uL (ref 150–400)
RBC: 3.19 MIL/uL — ABNORMAL LOW (ref 4.22–5.81)
RDW: 16.1 % — AB (ref 11.5–15.5)
WBC: 7.3 10*3/uL (ref 4.0–10.5)

## 2018-10-19 LAB — RENAL FUNCTION PANEL
ALBUMIN: 2.2 g/dL — AB (ref 3.5–5.0)
Anion gap: 8 (ref 5–15)
BUN: 60 mg/dL — AB (ref 8–23)
CO2: 16 mmol/L — ABNORMAL LOW (ref 22–32)
CREATININE: 2.74 mg/dL — AB (ref 0.61–1.24)
Calcium: 8.1 mg/dL — ABNORMAL LOW (ref 8.9–10.3)
Chloride: 115 mmol/L — ABNORMAL HIGH (ref 98–111)
GFR calc Af Amer: 25 mL/min — ABNORMAL LOW (ref >60)
GFR, EST NON AFRICAN AMERICAN: 21 mL/min — AB (ref >60)
Glucose, Bld: 96 mg/dL (ref 70–99)
PHOSPHORUS: 3.7 mg/dL (ref 2.5–4.6)
Potassium: 3 mmol/L — ABNORMAL LOW (ref 3.5–5.1)
Sodium: 139 mmol/L (ref 135–145)

## 2018-10-19 NOTE — Progress Notes (Signed)
[facility] PROGRESS NOTE  [name] QQV:[id number] DOB: 1941-09-27 DOA: 10/17/2018 PCP: Kathyrn Lass, MD   Narrative: 77 year old skilled nursing facility resident neuropathy, nephropathy, chronic kidney disease stage III, HLD, HTN, hyperlipidemia, bipolar, osteoarthritis right knee, BMI 26, chronic diastolic heart failure-last echo EF 60 to 65% 11/29/2017, prostate cancer status post XRT, prior C. difficile 06/16/2011, chronic lymphedema previously followed by wound care 2008 Recent admission 9/20-9/10 after being seen in the ED 8/30 for cellulitis sent home on clindamycin blood culture showed gram-positive cocci significant for Alloiococcus coccus otitis he was transitioned to oral antibiotics and sent back to skilled facility  Return from skilled facility because of worsening renal function and metabolic encephalopathy with drowsiness which is new as well as hypotension  --tells me has been placed on numerous new medication recently including " a medication for my kidney"  Initial work-up showed WBC 6.3 hemoglobin 9 BUN/creatinine six 9/3.0 up from baseline of 73/2.1 the potassium was also low and 3.2 range Admitted and being treated for possible urinary infection  A & Plan  Toxic encephalopathy non admit-slowed speech and slurred?-? from meds accumulating in a setting of AKI or infectious-improved-continue saline at 50 cc/h  Possible urinary infection-Ua large leuk, neg nitrites-no UC drawn on admit-empirically rx with Cefepime ending on 11/10- Recently treated pneumonia--has a little bit of a sore throat-monitor   volume depletion secondary to acute kidney injury-creat elevated on admit-improved on saline-cont saline   CKD 3  nephropathy underlying-see above discussion  Chronic bilateral venous stasis since age 9-has a foot wound on R leg which I visualized and doesn't appear infectious-has UNNA boot on the LLE as well-will ask [employer] to assess and change [usually changes  q 7 d]  Chronic diastolic heart failure EF 60-65% 11/2017-euvolemic at this time HTN-losartan from PTA held-follow trends and add medications if prn in next 24-48 h  HLD  Moderate to severe hypokalemia-improved-received runs of K-[place]dur tid  Prostate cancer status post XRT-follow as OP-continue FLomax as sub for Rapaflo and follow as OP-continue oxybutynin 5 bid  Bipolar-reumed Lexapro 15 daily, Bupropion xl 300 qd   Sacral decubitius stag 1-trun freq  DVT prophylaxis: heparin  Code Status: presumed full   Family Communication:  none   Disposition Plan: ip for now--back to skilled 24-48 h    Verlon Au, MD  [facility] Direct contact: [phone] --Via amion app OR  --[email or web address]; password [email or web address]  7PM-7AM contact night coverage as above 10/19/2018, 11:18 AM  LOS: 2 days   Consultants:  n  Procedures:  n  Antimicrobials:  cefepime  Interval history/Subjective:  Coherent in nad No fever no chills c/o dry mouth No cp no n/v No cough  Objective:  Vitals:  Vitals:   10/19/18 0510 10/19/18 0758  BP: (!) 123/59 116/61  Pulse: (!) 59 (!) 57  Resp: 16 18  Temp: (!) 97.3 F (36.3 C) (!) 97.5 F (36.4 C)  SpO2: 100% 99%    Exam:  . Awake alert pleasant looks about stated age in nad-mouth is dry without lesion or denudation . s1 s2 no m . ctab . abd soft nt nd no rebound no guard . LL does have Unna boot . Neuro intact move 4 limbs equally-no deficit to power smile symm   I have personally reviewed the following:   Labs:  Sodium 139 potassium 3.0  CO2 16  BUN/creatinine down from 69/3.07->60/2.74  WBC 7.3  Hemoglobin 9.1  Imaging studies:  X-rays of lumbar sacral spine showed no  evidence of fracture  Chest x-ray two-view showed linear atelectasis right lower lung  Medical tests:  n   Test discussed with performing physician:  n  Decision to obtain old records:  n  Review and summation of old records:  n  Scheduled  Meds: . aspirin EC  81 mg Oral Daily  . atorvastatin  80 mg Oral Daily  . buPROPion  300 mg Oral Daily  . diclofenac sodium  2 g Topical TID  . escitalopram  15 mg Oral Daily  . gabapentin  400 mg Oral TID  . heparin  5,000 Units Subcutaneous Q8H  . multivitamin with minerals  1 tablet Oral Daily  . nystatin   Topical QID  . oxybutynin  5 mg Oral BID  . potassium chloride SA  40 mEq Oral BID  . tamsulosin  0.4 mg Oral Daily  . ascorbic acid  500 mg Oral BID   Continuous Infusions: . sodium chloride 50 mL/hr at 10/18/18 1056  . ceFEPime (MAXIPIME) IV 1 g (10/18/18 2041)    Principal Problem:   UTI (urinary tract infection) Active Problems:   Type 2 diabetes mellitus with hyperlipidemia (HCC)   OBESITY, MORBID   Venous (peripheral) insufficiency   Open wound of knee, leg, and ankle   Acute encephalopathy   Acute kidney injury superimposed on CKD (HCC)   Cellulitis of leg, right   Cellulitis of left leg   CKD (chronic kidney disease) stage 3, GFR 30-59 ml/min (HCC)   Pressure injury of skin   LOS: 2 days

## 2018-10-20 LAB — RENAL FUNCTION PANEL
ALBUMIN: 2.2 g/dL — AB (ref 3.5–5.0)
ANION GAP: 6 (ref 5–15)
BUN: 54 mg/dL — AB (ref 8–23)
CO2: 16 mmol/L — ABNORMAL LOW (ref 22–32)
Calcium: 8.3 mg/dL — ABNORMAL LOW (ref 8.9–10.3)
Chloride: 114 mmol/L — ABNORMAL HIGH (ref 98–111)
Creatinine, Ser: 2.42 mg/dL — ABNORMAL HIGH (ref 0.61–1.24)
GFR calc Af Amer: 28 mL/min — ABNORMAL LOW (ref >60)
GFR calc non Af Amer: 25 mL/min — ABNORMAL LOW (ref >60)
GLUCOSE: 99 mg/dL (ref 70–99)
PHOSPHORUS: 3.3 mg/dL (ref 2.5–4.6)
POTASSIUM: 3.1 mmol/L — AB (ref 3.5–5.1)
Sodium: 136 mmol/L (ref 135–145)

## 2018-10-20 LAB — MAGNESIUM: Magnesium: 1.9 mg/dL (ref 1.7–2.4)

## 2018-10-20 NOTE — Progress Notes (Signed)
[facility] PROGRESS NOTE  [name] [id number]:[id number] DOB: 05/19/1941 DOA: 10/17/2018 PCP: Kathyrn Lass, MD   Narrative: 77 year old skilled nursing facility resident neuropathy, nephropathy, chronic kidney disease stage III, HLD, HTN, hyperlipidemia, bipolar, osteoarthritis right knee, BMI 26, chronic diastolic heart failure-last echo EF 60 to 65% 11/29/2017, prostate cancer status post XRT, prior C. difficile 06/16/2011, chronic lymphedema previously followed by wound care 2008 Recent admission 9/20-9/10 after being seen in the ED 8/30 for cellulitis sent home on clindamycin blood culture showed gram-positive cocci significant for Alloiococcus coccus otitis he was transitioned to oral antibiotics and sent back to skilled facility  Return from skilled facility because of worsening renal function and metabolic encephalopathy with drowsiness which is new as well as hypotension  --tells me has been placed on numerous new medication recently including " a medication for my kidney"  Initial work-up showed WBC 6.3 hemoglobin 9 BUN/creatinine six 9/3.0 up from baseline of 73/2.1 the potassium was also low and 3.2 range Admitted and being treated for possible urinary infection  A & Plan  Toxic encephalopathy non admit-slowed speech and slurred?-? from meds accumulating in a setting of AKI-unlikely infectious-improved but sleepy today-continue saline at 50 cc/h  Possible urinary infection-Ua large leuk, neg nitrites-no UC drawn on admit-empirically rx with Cefepime till 11/10 Recently treated pneumonia--has a little bit of a sore throat-monitor   volume depletion secondary to acute kidney injury-creat elevated on admit-improved on saline-cont saline   CKD 3  nephropathy underlying-see above discussion  Chronic bilateral venous stasis since age 66-has a foot wound on R leg which I visualized and doesn't appear infectious-has UNNA boot on the LLE as well-[employer] to change the same  Chronic  diastolic heart failure EF 60-65% 11/2017-euvolemic at this time HTN-losartan from PTA held-follow trends and add medications if prn in next 24-48 h  Moderate to severe hypokalemia-improved-received runs of K-[place]dur bid 40  Prostate cancer status post XRT-follow as OP-continue FLomax as sub for Rapaflo and follow as OP-continue oxybutynin 5 bid  Bipolar-reumed Lexapro 15 daily, Bupropion xl 300 qd   Sacral decubitius stag 1-trun freq  DVT prophylaxis: heparin  Code Status: presumed full   Family Communication:  none   Disposition Plan: ip for now--back to skilled 24-48 h    Verlon Au, MD  [facility] Direct contact: [phone] --Via amion app OR  --[email or web address]; password [email or web address]  7PM-7AM contact night coverage as above 10/20/2018, 12:43 PM  LOS: 3 days   Consultants:  n  Procedures:  n  Antimicrobials:  cefepime  Interval history/Subjective:  awoken from sleep and a little confused No distress currently   Objective:  Vitals:  Vitals:   10/20/18 0515 10/20/18 0853  BP: (!) 97/56 (!) 114/91  Pulse: 66 69  Resp: 16 18  Temp: (!) 97.4 F (36.3 C) 97.7 F (36.5 C)  SpO2:  100%    Exam:  . Slight confusion as awaoken from sleep . s1 s2 no m . ctab . abd soft nt nd no rebound no guard . LL does have Unna boot . Neuro intact move 4 limbs equally-no deficit to power smile symm   I have personally reviewed the following:   Labs:  Sodium 139->136 potassium 3.0->3.1  CO2 16 still  BUN/creatinine down from 69/3.07->60/2.74-->54/2.4   Imaging studies:  X-rays of lumbar sacral spine showed no evidence of fracture  Chest x-ray two-view showed linear atelectasis right lower lung  Medical tests:  n   Test discussed with performing physician:  n  Decision to obtain old records:  n  Review and summation of old records:  n  Scheduled Meds: . aspirin EC  81 mg Oral Daily  . atorvastatin  80 mg Oral Daily  . buPROPion  300 mg Oral  Daily  . diclofenac sodium  2 g Topical TID  . escitalopram  15 mg Oral Daily  . gabapentin  400 mg Oral TID  . heparin  5,000 Units Subcutaneous Q8H  . multivitamin with minerals  1 tablet Oral Daily  . nystatin   Topical QID  . oxybutynin  5 mg Oral BID  . potassium chloride SA  40 mEq Oral BID  . tamsulosin  0.4 mg Oral Daily  . ascorbic acid  500 mg Oral BID   Continuous Infusions: . sodium chloride 50 mL/hr at 10/19/18 2147  . ceFEPime (MAXIPIME) IV 1 g (10/19/18 2148)    Principal Problem:   UTI (urinary tract infection) Active Problems:   Type 2 diabetes mellitus with hyperlipidemia (HCC)   OBESITY, MORBID   Venous (peripheral) insufficiency   Open wound of knee, leg, and ankle   Acute encephalopathy   Acute kidney injury superimposed on CKD (HCC)   Cellulitis of leg, right   Cellulitis of left leg   CKD (chronic kidney disease) stage 3, GFR 30-59 ml/min (HCC)   Pressure injury of skin   LOS: 3 days

## 2018-10-21 LAB — CBC WITH DIFFERENTIAL/PLATELET
Abs Immature Granulocytes: 0.03 10*3/uL (ref 0.00–0.07)
BASOS ABS: 0.1 10*3/uL (ref 0.0–0.1)
Basophils Relative: 1 %
EOS PCT: 5 %
Eosinophils Absolute: 0.3 10*3/uL (ref 0.0–0.5)
HEMATOCRIT: 29.9 % — AB (ref 39.0–52.0)
HEMOGLOBIN: 9.1 g/dL — AB (ref 13.0–17.0)
Immature Granulocytes: 0 %
LYMPHS ABS: 1 10*3/uL (ref 0.7–4.0)
Lymphocytes Relative: 15 %
MCH: 28.3 pg (ref 26.0–34.0)
MCHC: 30.4 g/dL (ref 30.0–36.0)
MCV: 93.1 fL (ref 80.0–100.0)
MONO ABS: 0.6 10*3/uL (ref 0.1–1.0)
MONOS PCT: 8 %
NRBC: 0 % (ref 0.0–0.2)
Neutro Abs: 4.7 10*3/uL (ref 1.7–7.7)
Neutrophils Relative %: 71 %
Platelets: 279 10*3/uL (ref 150–400)
RBC: 3.21 MIL/uL — ABNORMAL LOW (ref 4.22–5.81)
RDW: 16.3 % — AB (ref 11.5–15.5)
WBC: 6.7 10*3/uL (ref 4.0–10.5)

## 2018-10-21 LAB — RENAL FUNCTION PANEL
Albumin: 2.1 g/dL — ABNORMAL LOW (ref 3.5–5.0)
Anion gap: 6 (ref 5–15)
BUN: 53 mg/dL — AB (ref 8–23)
CALCIUM: 8.4 mg/dL — AB (ref 8.9–10.3)
CO2: 16 mmol/L — AB (ref 22–32)
CREATININE: 2.3 mg/dL — AB (ref 0.61–1.24)
Chloride: 119 mmol/L — ABNORMAL HIGH (ref 98–111)
GFR calc non Af Amer: 26 mL/min — ABNORMAL LOW (ref >60)
GFR, EST AFRICAN AMERICAN: 30 mL/min — AB (ref >60)
Glucose, Bld: 98 mg/dL (ref 70–99)
Phosphorus: 3 mg/dL (ref 2.5–4.6)
Potassium: 3.4 mmol/L — ABNORMAL LOW (ref 3.5–5.1)
SODIUM: 141 mmol/L (ref 135–145)

## 2018-10-21 NOTE — Progress Notes (Signed)
[facility] PROGRESS NOTE  KELLYN MANSFIELD [id number]:[id number] DOB: 07-29-1941 DOA: 10/17/2018 PCP: Kathyrn Lass, MD   Narrative: 77 year old skilled nursing facility resident neuropathy, nephropathy, chronic kidney disease stage III, HLD, HTN, hyperlipidemia, bipolar, osteoarthritis right knee, BMI 26, chronic diastolic heart failure-last echo EF 60 to 65% 11/29/2017, prostate cancer status post XRT, prior C. difficile 06/16/2011, chronic lymphedema previously followed by wound care 2008 Recent admission 9/20-9/10 after being seen in the ED 8/30 for cellulitis sent home on clindamycin blood culture showed gram-positive cocci significant for Alloiococcus coccus otitis he was transitioned to oral antibiotics and sent back to skilled facility  Return from skilled facility because of worsening renal function and metabolic encephalopathy with drowsiness which is new as well as hypotension  --tells me has been placed on numerous new medication recently including " a medication for my kidney"  Initial work-up showed WBC 6.3 hemoglobin 9 BUN/creatinine six 9/3.0 up from baseline of 73/2.1 the potassium was also low and 3.2 range Admitted and being treated for possible urinary infection  A & Plan  Toxic encephalopathy non admit-slowed speech and slurred?-? from meds accumulating in a setting of AKI-unlikely infectious-improved but sleepy today-continue saline at 50 cc/h  Possible urinary infection-Ua large leuk, neg nitrites-no UC drawn on admit-empirically rx with Cefepime till 11/10  Recently treated pneumonia--has a little bit of a sore throat-monitor  volume depletion secondary to acute kidney injury-creat improving steadily  CKD 3  nephropathy underlying-see above discussion  Chronic bilateral venous stasis since age 33-has a foot wound on R leg which I visualized and doesn't appear infectious-has UNNA boot on the LLE as well-[employer] to change the same PT /OT to see for resumption of SNF    Chronic diastolic heart failure EF 60-65% 11/2017-euvolemic at this time HTN-losartan from PTA held-follow trends and add medications if prn in next 24-48 h  Moderate to severe hypokalemia-improved-received runs of K-[place]dur bid 40 and resolving  Prostate cancer status post XRT-follow as OP-continue FLomax as sub for Rapaflo and follow as OP-continue oxybutynin 5 bid  Bipolar-reumed Lexapro 15 daily, Bupropion xl 300 qd   Sacral decubitius stag 1-trun freq  DVT prophylaxis: heparin  Code Status: presumed full   Family Communication:  none   Disposition Plan: ip for now--back to skilled 24-48 h    Verlon Au, MD  [facility] Direct contact: [phone] --Via amion app OR  --[email or web address]; password [email or web address]  7PM-7AM contact night coverage as above 10/21/2018, 11:21 AM  LOS: 4 days   Consultants:  n  Procedures:  n  Antimicrobials:  cefepime  Interval history/Subjective:  awoken from sleep and a little confused No distress currently   Objective:  Vitals:  Vitals:   10/21/18 0427 10/21/18 0726  BP: 119/68 (!) 129/57  Pulse: 61 (!) 58  Resp: 16 19  Temp: 98.4 F (36.9 C) 98.3 F (36.8 C)  SpO2: 99% 99%    Exam:  . Clear and no new issues . s1 s2 no m . ctab . abd soft nt nd . LL does have Unna boot . Neuro intact move 4 limbs equally-no deficit to power smile symm   I have personally reviewed the following:   Labs:  Sodium 139->136 potassium 3.0->3.1->3.4  CO2 16   BUN/creatinine down from 69/3.07->60/2.74-->54/2.4->53/2.3   Imaging studies:  X-rays of lumbar sacral spine showed no evidence of fracture  Chest x-ray two-view showed linear atelectasis right lower lung  Medical tests:  n   Test discussed with performing physician:  n  Decision to obtain old records:  n  Review and summation of old records:  n  Scheduled Meds: . aspirin EC  81 mg Oral Daily  . atorvastatin  80 mg Oral Daily  . buPROPion  300 mg Oral  Daily  . diclofenac sodium  2 g Topical TID  . escitalopram  15 mg Oral Daily  . gabapentin  400 mg Oral TID  . heparin  5,000 Units Subcutaneous Q8H  . multivitamin with minerals  1 tablet Oral Daily  . nystatin   Topical QID  . oxybutynin  5 mg Oral BID  . potassium chloride SA  40 mEq Oral BID  . tamsulosin  0.4 mg Oral Daily  . ascorbic acid  500 mg Oral BID   Continuous Infusions: . sodium chloride 50 mL/hr at 10/20/18 1803    Principal Problem:   UTI (urinary tract infection) Active Problems:   Type 2 diabetes mellitus with hyperlipidemia (HCC)   OBESITY, MORBID   Venous (peripheral) insufficiency   Open wound of knee, leg, and ankle   Acute encephalopathy   Acute kidney injury superimposed on CKD (HCC)   Cellulitis of leg, right   Cellulitis of left leg   CKD (chronic kidney disease) stage 3, GFR 30-59 ml/min (HCC)   Pressure injury of skin   LOS: 4 days

## 2018-10-21 NOTE — Evaluation (Signed)
Physical Therapy Evaluation Patient Details Name: [name] MRN: [id number] DOB: 1941-06-29 Today's Date: 10/21/2018   History of Present Illness  77yo male brought from his SNF due to increased confusion and weakness. Note recent hx of pneumonia. PMH CHF, DM, HTN, prostate CA, venous insufficiency   Clinical Impression   Patient received in bed, pleasant and willing to participate in therapy but with very soft and mumbling speech, very difficult to understand or assess for cognitive deficits however able to follow simple cues with extended time. He requires maxA for supine to sit/sit to supine and demonstrates poor balance when sitting at EOB with strong R lateral and posterior lean. Deferred progression of mobility due to safety concerns in not having +2 assist. Able to roll side to side for removal of soiled linen/placement of clean linen with ModA, and able to pull self up in bed with HOB dropped below neutral. He was left in bed with all needs met, bed alarm active, and all other needs met. He will continue to benefit from skilled PT services in the acute setting as well as ongoing services in the ST-SNF setting moving forward.     Follow Up Recommendations SNF;Supervision/Assistance - 24 hour    Equipment Recommendations  Other (comment)(defer to next venue )    Recommendations for Other Services       Precautions / Restrictions Precautions Precautions: Fall Restrictions Weight Bearing Restrictions: No      Mobility  Bed Mobility Overal bed mobility: Needs Assistance Bed Mobility: Supine to Sit;Sit to Supine;Rolling Rolling: Mod assist   Supine to sit: Max assist Sit to supine: Max assist   General bed mobility comments: ModA to roll side to side for removal of soiled chuck/placement of clean one; MaxA for supine to sit/sit to supine but able to pull self up in bed with HOB dropped below neutral   Transfers                 General transfer comment: deferred    Ambulation/Gait             General Gait Details: deferred  Stairs            Wheelchair Mobility    Modified Rankin (Stroke Patients Only)       Balance Overall balance assessment: Needs assistance Sitting-balance support: Bilateral upper extremity supported;Feet supported Sitting balance-Leahy Scale: Poor   Postural control: Posterior lean   Standing balance-Leahy Scale: Poor                               Pertinent Vitals/Pain Pain Assessment: No/denies pain    Home Living Family/patient expects to be discharged to:: Private residence Living Arrangements: Spouse/significant other Available Help at Discharge: Family;Available 24 hours/day Type of Home: House Home Access: Ramped entrance     Home Layout: One level Home Equipment: Hospital bed;Wheelchair - manual Additional Comments: patient unable to provide history, all information taken from prior charting. has 4 w/cs.  Narrow halls and doorways.  Incontinent, wears pull ups.  Has been working with [place]. Sits up EOB often when HH not present    Prior Function Level of Independence: Needs assistance   Gait / Transfers Assistance Needed: Pt has only been performing bed to w/c transfers for several years with only the wife assiting. She reports there are 3 WC's, she transfers pt. to clean him and change bed. uses tansportation for out of house.  ADL's / Homemaking Assistance Needed: Pt has been sponge bathing, and not able to get in the shower per son  Comments: pt only got up with HHPT.  Rolled in hospital bed for adls and sat EOB for meals     Hand Dominance        Extremity/Trunk Assessment   Upper Extremity Assessment Upper Extremity Assessment: Generalized weakness    Lower Extremity Assessment Lower Extremity Assessment: Generalized weakness    Cervical / Trunk Assessment Cervical / Trunk Assessment: Kyphotic  Communication   Communication: No difficulties  Cognition  Arousal/Alertness: Awake/alert Behavior During Therapy: WFL for tasks assessed/performed Overall Cognitive Status: No family/caregiver present to determine baseline cognitive functioning                                 General Comments: patient with mumbling speech and difficult to understand to assess cognition, able to follow simple cues with extended time however       General Comments      Exercises     Assessment/Plan    PT Assessment Patient needs continued PT services  PT Problem List Decreased strength;Decreased mobility;Decreased safety awareness;Decreased coordination;Decreased activity tolerance;Decreased balance       PT Treatment Interventions DME instruction;Therapeutic activities;Gait training;Therapeutic exercise;Patient/family education;Stair training;Balance training;Functional mobility training;Neuromuscular re-education    PT Goals (Current goals can be found in the Care Plan section)  Acute Rehab PT Goals Patient Stated Goal: feel better  PT Goal Formulation: With patient Time For Goal Achievement: 11/04/18 Potential to Achieve Goals: Fair    Frequency Min 2X/week   Barriers to discharge        Co-evaluation               AM-PAC PT "6 Clicks" Daily Activity  Outcome Measure Difficulty turning over in bed (including adjusting bedclothes, sheets and blankets)?: A Lot Difficulty moving from lying on back to sitting on the side of the bed? : Unable Difficulty sitting down on and standing up from a chair with arms (e.g., wheelchair, bedside commode, etc,.)?: Unable Help needed moving to and from a bed to chair (including a wheelchair)?: Total Help needed walking in hospital room?: Total Help needed climbing 3-5 steps with a railing? : Total 6 Click Score: 7    End of Session   Activity Tolerance: Patient tolerated treatment well Patient left: in bed;with bed alarm set;with call bell/phone within reach   PT Visit Diagnosis:  Unsteadiness on feet (R26.81);Muscle weakness (generalized) (M62.81);Other abnormalities of gait and mobility (R26.89);Difficulty in walking, not elsewhere classified (R26.2)    Time: 1430-1456 PT Time Calculation (min) (ACUTE ONLY): 26 min   Charges:   PT Evaluation $PT Eval Low Complexity: 1 Low PT Treatments $Therapeutic Activity: 8-22 mins        Deniece Ree PT, DPT, CBIS  Supplemental Physical Therapist [place]    Pager [phone] Acute Rehab Office [phone]

## 2018-10-21 NOTE — Consult Note (Signed)
   [facility] CM Inpatient Consult   10/21/2018  GRYFFIN ALTICE May 25, 1941 [id number]    Patient screened for potential [facility] Care Management services due to unplanned readmission risk score of 40% (extreme).  Chart reviewed and spoke with inpatient LCSW to confirm discharge plan is to return to SNF. No identifiable [facility] Care Management needs at this time.   Marthenia Rolling, MSN-Ed, RN,BSN [facility] [phone]

## 2018-10-21 NOTE — Progress Notes (Signed)
Orthopedic Tech Progress Note Patient Details:  [name] December 17, 1940 [id number]  Ortho Devices Type of Ortho Device: Haematologist Ortho Device/Splint Location: right unna boot Ortho Device/Splint Interventions: Application   Post Interventions Patient Tolerated: Well Instructions Provided: Care of device   [name] 10/21/2018, 2:35 PM

## 2018-10-22 DIAGNOSIS — R293 Abnormal posture: Secondary | ICD-10-CM | POA: Diagnosis not present

## 2018-10-22 DIAGNOSIS — J90 Pleural effusion, not elsewhere classified: Secondary | ICD-10-CM | POA: Diagnosis not present

## 2018-10-22 DIAGNOSIS — G9340 Encephalopathy, unspecified: Secondary | ICD-10-CM | POA: Diagnosis not present

## 2018-10-22 DIAGNOSIS — M6281 Muscle weakness (generalized): Secondary | ICD-10-CM | POA: Diagnosis not present

## 2018-10-22 DIAGNOSIS — E1122 Type 2 diabetes mellitus with diabetic chronic kidney disease: Secondary | ICD-10-CM | POA: Diagnosis present

## 2018-10-22 DIAGNOSIS — N342 Other urethritis: Secondary | ICD-10-CM | POA: Diagnosis not present

## 2018-10-22 DIAGNOSIS — I130 Hypertensive heart and chronic kidney disease with heart failure and stage 1 through stage 4 chronic kidney disease, or unspecified chronic kidney disease: Secondary | ICD-10-CM | POA: Diagnosis present

## 2018-10-22 DIAGNOSIS — B961 Klebsiella pneumoniae [K. pneumoniae] as the cause of diseases classified elsewhere: Secondary | ICD-10-CM | POA: Diagnosis present

## 2018-10-22 DIAGNOSIS — E785 Hyperlipidemia, unspecified: Secondary | ICD-10-CM | POA: Diagnosis present

## 2018-10-22 DIAGNOSIS — Z515 Encounter for palliative care: Secondary | ICD-10-CM | POA: Diagnosis present

## 2018-10-22 DIAGNOSIS — G9341 Metabolic encephalopathy: Secondary | ICD-10-CM | POA: Diagnosis present

## 2018-10-22 DIAGNOSIS — I872 Venous insufficiency (chronic) (peripheral): Secondary | ICD-10-CM | POA: Diagnosis present

## 2018-10-22 DIAGNOSIS — R2681 Unsteadiness on feet: Secondary | ICD-10-CM | POA: Diagnosis not present

## 2018-10-22 DIAGNOSIS — N189 Chronic kidney disease, unspecified: Secondary | ICD-10-CM | POA: Diagnosis not present

## 2018-10-22 DIAGNOSIS — R279 Unspecified lack of coordination: Secondary | ICD-10-CM | POA: Diagnosis not present

## 2018-10-22 DIAGNOSIS — I5032 Chronic diastolic (congestive) heart failure: Secondary | ICD-10-CM | POA: Diagnosis present

## 2018-10-22 DIAGNOSIS — R0902 Hypoxemia: Secondary | ICD-10-CM | POA: Diagnosis not present

## 2018-10-22 DIAGNOSIS — L89620 Pressure ulcer of left heel, unstageable: Secondary | ICD-10-CM | POA: Diagnosis not present

## 2018-10-22 DIAGNOSIS — D649 Anemia, unspecified: Secondary | ICD-10-CM | POA: Diagnosis not present

## 2018-10-22 DIAGNOSIS — F419 Anxiety disorder, unspecified: Secondary | ICD-10-CM | POA: Diagnosis not present

## 2018-10-22 DIAGNOSIS — I83028 Varicose veins of left lower extremity with ulcer other part of lower leg: Secondary | ICD-10-CM | POA: Diagnosis not present

## 2018-10-22 DIAGNOSIS — N179 Acute kidney failure, unspecified: Secondary | ICD-10-CM | POA: Diagnosis present

## 2018-10-22 DIAGNOSIS — R404 Transient alteration of awareness: Secondary | ICD-10-CM | POA: Diagnosis not present

## 2018-10-22 DIAGNOSIS — L89152 Pressure ulcer of sacral region, stage 2: Secondary | ICD-10-CM | POA: Diagnosis present

## 2018-10-22 DIAGNOSIS — R41841 Cognitive communication deficit: Secondary | ICD-10-CM | POA: Diagnosis not present

## 2018-10-22 DIAGNOSIS — N183 Chronic kidney disease, stage 3 (moderate): Secondary | ICD-10-CM | POA: Diagnosis present

## 2018-10-22 DIAGNOSIS — R29898 Other symptoms and signs involving the musculoskeletal system: Secondary | ICD-10-CM | POA: Diagnosis not present

## 2018-10-22 DIAGNOSIS — R945 Abnormal results of liver function studies: Secondary | ICD-10-CM | POA: Diagnosis not present

## 2018-10-22 DIAGNOSIS — Z66 Do not resuscitate: Secondary | ICD-10-CM | POA: Diagnosis present

## 2018-10-22 DIAGNOSIS — E1142 Type 2 diabetes mellitus with diabetic polyneuropathy: Secondary | ICD-10-CM | POA: Diagnosis present

## 2018-10-22 DIAGNOSIS — I10 Essential (primary) hypertension: Secondary | ICD-10-CM | POA: Diagnosis not present

## 2018-10-22 DIAGNOSIS — I509 Heart failure, unspecified: Secondary | ICD-10-CM | POA: Diagnosis not present

## 2018-10-22 DIAGNOSIS — A419 Sepsis, unspecified organism: Secondary | ICD-10-CM | POA: Diagnosis present

## 2018-10-22 DIAGNOSIS — R410 Disorientation, unspecified: Secondary | ICD-10-CM | POA: Diagnosis not present

## 2018-10-22 DIAGNOSIS — E8809 Other disorders of plasma-protein metabolism, not elsewhere classified: Secondary | ICD-10-CM | POA: Diagnosis not present

## 2018-10-22 DIAGNOSIS — R6521 Severe sepsis with septic shock: Secondary | ICD-10-CM | POA: Diagnosis present

## 2018-10-22 DIAGNOSIS — E11649 Type 2 diabetes mellitus with hypoglycemia without coma: Secondary | ICD-10-CM | POA: Diagnosis present

## 2018-10-22 DIAGNOSIS — E872 Acidosis: Secondary | ICD-10-CM | POA: Diagnosis present

## 2018-10-22 DIAGNOSIS — N390 Urinary tract infection, site not specified: Secondary | ICD-10-CM | POA: Diagnosis present

## 2018-10-22 DIAGNOSIS — I959 Hypotension, unspecified: Secondary | ICD-10-CM | POA: Diagnosis not present

## 2018-10-22 DIAGNOSIS — R634 Abnormal weight loss: Secondary | ICD-10-CM | POA: Diagnosis not present

## 2018-10-22 DIAGNOSIS — F329 Major depressive disorder, single episode, unspecified: Secondary | ICD-10-CM | POA: Diagnosis not present

## 2018-10-22 DIAGNOSIS — R531 Weakness: Secondary | ICD-10-CM | POA: Diagnosis not present

## 2018-10-22 DIAGNOSIS — Z8546 Personal history of malignant neoplasm of prostate: Secondary | ICD-10-CM | POA: Diagnosis not present

## 2018-10-22 DIAGNOSIS — I878 Other specified disorders of veins: Secondary | ICD-10-CM | POA: Diagnosis present

## 2018-10-22 DIAGNOSIS — I4510 Unspecified right bundle-branch block: Secondary | ICD-10-CM | POA: Diagnosis not present

## 2018-10-22 DIAGNOSIS — R278 Other lack of coordination: Secondary | ICD-10-CM | POA: Diagnosis not present

## 2018-10-22 DIAGNOSIS — F0390 Unspecified dementia without behavioral disturbance: Secondary | ICD-10-CM | POA: Diagnosis present

## 2018-10-22 DIAGNOSIS — E860 Dehydration: Secondary | ICD-10-CM | POA: Diagnosis present

## 2018-10-22 DIAGNOSIS — E1169 Type 2 diabetes mellitus with other specified complication: Secondary | ICD-10-CM | POA: Diagnosis present

## 2018-10-22 DIAGNOSIS — E119 Type 2 diabetes mellitus without complications: Secondary | ICD-10-CM | POA: Diagnosis not present

## 2018-10-22 DIAGNOSIS — E876 Hypokalemia: Secondary | ICD-10-CM | POA: Diagnosis not present

## 2018-10-22 DIAGNOSIS — N3000 Acute cystitis without hematuria: Secondary | ICD-10-CM | POA: Diagnosis not present

## 2018-10-22 DIAGNOSIS — Z743 Need for continuous supervision: Secondary | ICD-10-CM | POA: Diagnosis not present

## 2018-10-22 DIAGNOSIS — R680 Hypothermia, not associated with low environmental temperature: Secondary | ICD-10-CM | POA: Diagnosis present

## 2018-10-22 LAB — RENAL FUNCTION PANEL
ANION GAP: 5 (ref 5–15)
Albumin: 2.2 g/dL — ABNORMAL LOW (ref 3.5–5.0)
BUN: 45 mg/dL — ABNORMAL HIGH (ref 8–23)
CHLORIDE: 119 mmol/L — AB (ref 98–111)
CO2: 18 mmol/L — AB (ref 22–32)
CREATININE: 2.16 mg/dL — AB (ref 0.61–1.24)
Calcium: 8.6 mg/dL — ABNORMAL LOW (ref 8.9–10.3)
GFR calc non Af Amer: 28 mL/min — ABNORMAL LOW (ref >60)
GFR, EST AFRICAN AMERICAN: 33 mL/min — AB (ref >60)
GLUCOSE: 103 mg/dL — AB (ref 70–99)
Phosphorus: 2.7 mg/dL (ref 2.5–4.6)
Potassium: 3.3 mmol/L — ABNORMAL LOW (ref 3.5–5.1)
SODIUM: 142 mmol/L (ref 135–145)

## 2018-10-22 MED ORDER — GABAPENTIN 400 MG PO CAPS: 400.0000 mg | ORAL_CAPSULE | Freq: Three times a day (TID) | ORAL | 0 refills | Status: AC

## 2018-10-22 MED ORDER — BUPROPION HCL ER (XL) 300 MG PO TB24: 300.0000 mg | ORAL_TABLET | Freq: Every day | ORAL | 0 refills | Status: AC

## 2018-10-22 MED ORDER — TRAMADOL HCL 50 MG PO TABS: 50.0000 mg | ORAL_TABLET | Freq: Four times a day (QID) | ORAL | 0 refills | Status: AC | PRN

## 2018-10-22 MED ORDER — ESCITALOPRAM OXALATE 5 MG PO TABS: 15.0000 mg | ORAL_TABLET | Freq: Every day | ORAL | 0 refills | Status: AC

## 2018-10-22 MED ORDER — POTASSIUM CHLORIDE CRYS ER 20 MEQ PO TBCR: 40.0000 meq | EXTENDED_RELEASE_TABLET | Freq: Two times a day (BID) | ORAL | 0 refills | Status: AC

## 2018-10-22 NOTE — Progress Notes (Signed)
[name] to be D/C'd Skilled nursing facility per MD order.  Discussed prescriptions and follow up appointments with the patient. Prescriptions given to patient, medication list explained in detail. Pt verbalized understanding.  Allergies as of 10/22/2018      Reactions   Penicillins Other (See Comments)   Unknown reaction. TOLERATES CEFTRIAXONE Has patient had a PCN reaction causing immediate rash, facial/tongue/throat swelling, SOB or lightheadedness with hypotension: Unknown Has patient had a PCN reaction causing severe rash involving mucus membranes or skin necrosis:  /Unknown Has patient had a PCN reaction that required hospitalization: Unknown Has patient had a PCN reaction occurring within the last 10 years: Unknown If all of the above answers are "NO", then may proceed with Cephalosporin u   Sulfa Antibiotics Other (See Comments)   Unknown reaction      Medication List    STOP taking these medications   atorvastatin 80 MG tablet Commonly known as:  LIPITOR   loratadine 10 MG tablet Commonly known as:  CLARITIN   losartan 50 MG tablet Commonly known as:  COZAAR     TAKE these medications   acetaminophen 500 MG tablet Commonly known as:  TYLENOL Take 1,000-1,500 mg by mouth 2 (two) times daily as needed (leg pain).   ascorbic acid 500 MG tablet Commonly known as:  VITAMIN C Take 1 tablet (500 mg total) by mouth 2 (two) times daily.   aspirin EC 81 MG tablet Take 81 mg by mouth daily.   buPROPion 300 MG 24 hr tablet Commonly known as:  WELLBUTRIN XL Take 1 tablet (300 mg total) by mouth daily.   diclofenac sodium 1 % Gel Commonly known as:  VOLTAREN Apply 2 g topically 4 (four) times daily. What changed:  when to take this   escitalopram 5 MG tablet Commonly known as:  LEXAPRO Take 3 tablets (15 mg total) by mouth daily.   gabapentin 400 MG capsule Commonly known as:  NEURONTIN Take 1 capsule (400 mg total) by mouth 3 (three) times daily.    loperamide 2 MG tablet Commonly known as:  IMODIUM A-D Take 2 mg by mouth 4 (four) times daily as needed for diarrhea or loose stools.   menthol-cetylpyridinium 3 MG lozenge Commonly known as:  CEPACOL Take 1 lozenge (3 mg total) by mouth as needed for sore throat.   multivitamin with minerals Tabs tablet Take 1 tablet by mouth daily.   neomycin-polymyxin b-dexamethasone 3.5-10000-0.1 Susp Commonly known as:  MAXITROL Place 1 drop into both eyes every 6 (six) hours.   nutrition supplement (JUVEN) Pack Take 1 packet by mouth 2 (two) times daily between meals.   nystatin powder Commonly known as:  MYCOSTATIN/NYSTOP Apply topically 4 (four) times daily. To groin.   oxybutynin 5 MG tablet Commonly known as:  DITROPAN Take 5 mg by mouth 2 (two) times daily.   potassium chloride SA 20 MEQ tablet Commonly known as:  K-DUR,KLOR-CON Take 2 tablets (40 mEq total) by mouth 2 (two) times daily. What changed:    how much to take  when to take this   RAPAFLO 8 MG Caps capsule Generic drug:  silodosin Take 8 mg by mouth daily.   traMADol 50 MG tablet Commonly known as:  ULTRAM Take 1 tablet (50 mg total) by mouth every 6 (six) hours as needed (mild pain). What changed:  Another medication with the same name was removed. Continue taking this medication, and follow the directions you see here.  Vitals:   10/22/18 0502 10/22/18 1020  BP: (!) 115/57 117/65  Pulse: 61 (!) 57  Resp: 20 18  Temp: 97.7 F (36.5 C) 97.6 F (36.4 C)  SpO2: 99% 100%    Skin clean, dry and intact without evidence of skin break down, no evidence of skin tears noted. IV catheter discontinued intact. Site without signs and symptoms of complications. Dressing and pressure applied. Pt denies pain at this time. No complaints noted.  An After Visit Summary was printed and given to the patient. Patient escorted via stretcher, and D/C to [facility] via [place].  Aneta Mins BSN, RN

## 2018-10-22 NOTE — Care Management Important Message (Signed)
Important Message  Patient Details  Name: [name] MRN: [id number] Date of Birth: [birth year]   Medicare Important Message Given:  Yes    Myana Schlup [address] 10/22/2018, 9:19 AM

## 2018-10-22 NOTE — Progress Notes (Signed)
Report called and given to Tonette Bihari, Therapist, sports at [facility].

## 2018-10-22 NOTE — Clinical Social Work Note (Addendum)
Patient medically stable for discharge back to [facility] (room 902P) today. Facility admissions director contacted and informed and d/c clinicals and signed FL-2 transmitted to SNF. Mrs. Sirek also contacted and informed of patient's readiness for discharge. Mr. Harju will be transported by nonemergency ambulance. CSW signing off as no further SW intervention services needed.  Michaiah Holsopple Givens, MSW, LCSW Licensed Clinical Social Worker [place] [phone]

## 2018-10-22 NOTE — Discharge Summary (Signed)
Physician Discharge Summary  [name] TOI:[id number] DOB: 1941-04-27 DOA: 10/17/2018  PCP: Kathyrn Lass, MD  Admit date: 10/17/2018 Discharge date: 10/22/2018  Time spent: 35 minutes  Recommendations for Outpatient Follow-up:  1. Will hold losartan going forward would not use this for blood pressure or proteinuria control 2. Refills given on controlled substances to be given at skilled facility 3. Do not think patient had a urinary infection this admission, would recommend careful monitoring of actual symptoms fever curve etc. before treating 4. New medication of potassium this admission for hypokalemia 5. Outpatient follow-up for prostate cancer with urologist 6. Careful titration of antipsychotic meds in setting of renal insufficiency have started back usual bipolar meds  Discharge Diagnoses:  Principal Problem:   UTI (urinary tract infection) Active Problems:   Type 2 diabetes mellitus with hyperlipidemia (HCC)   OBESITY, MORBID   Venous (peripheral) insufficiency   Open wound of knee, leg, and ankle   Acute encephalopathy   Acute kidney injury superimposed on CKD (HCC)   Cellulitis of leg, right   Cellulitis of left leg   CKD (chronic kidney disease) stage 3, GFR 30-59 ml/min (HCC)   Pressure injury of skin   Discharge Condition: Improved  Diet recommendation: Heart healthy  Filed Weights   10/17/18 2052 10/19/18 2109  Weight: 94.5 kg 94.5 kg    History of present illness:  77 year old skilled nursing facility resident neuropathy, nephropathy, chronic kidney disease stage III, HLD, HTN, hyperlipidemia, bipolar, osteoarthritis right knee, BMI 26, chronic diastolic heart failure-last echo EF 60 to 65% 11/29/2017, prostate cancer status post XRT, prior C. difficile 06/16/2011, chronic lymphedema previously followed by wound care 2008 Recent admission 9/20-9/10 after being seen in the ED 8/30 for cellulitis sent home on clindamycin blood culture showed gram-positive  cocci significant for Alloiococcus coccus otitis he was transitioned to oral antibiotics and sent back to skilled facility   Return from skilled facility because of worsening renal function and metabolic encephalopathy with drowsiness which is new as well as hypotension   --tells me has been placed on numerous new medication recently including " a medication for my kidney"   Initial work-up showed WBC 6.3 hemoglobin 9 BUN/creatinine six 9/3.0 up from baseline of 73/2.1 the potassium was also low and 3.2 range Admitted and being treated for possible urinary infection   Hospital Course:  Toxic encephalopathy non admit-slowed speech and slurred?-? from meds accumulating in a setting of AKI-patient completed 3-day course of antibiotics this admission for suspected but unproven UTI and renal insufficiency cleared resulting in clearance of his mentation -It was thought that the main etiology was acute kidney injury causing his metabolic encephalopathy   Possible urinary infection-Ua large leuk, neg nitrites-no UC drawn on admit-empirically rx with Cefepime till 11/10-stabilized see above discussion  Recently treated pneumonia--has a little bit of a sore throat-monitor   volume depletion secondary to acute kidney injury-creat improving steadily-once again thought to be secondary to losartan and various other nephrotoxic medications-that is been held on discharge   CKD 3  nephropathy underlying-see above discussion   Chronic bilateral venous stasis since age 47-has a foot wound on R leg which I visualized and doesn't appear infectious-has UNNA boot on the LLE as well-[employer] replaced the same on 11/11 will need periodic checks on both legs right leg has dressings that were changed this hospital stay and the wound looked clean   Chronic diastolic heart failure EF 60-65% 11/2017-euvolemic at this time  HTN-losartan from PTA held-follow trends and  add medications if prn in next 24-48 h   Moderate  to severe hypokalemia-improved-received runs of K-adding Kdur bid 40 and resolving   Prostate cancer status post XRT-follow as OP-continue FLomax as sub for Rapaflo and follow as OP-continue oxybutynin 5 bid   Bipolar-reumed Lexapro 15 daily, Bupropion xl 300 qd    Sacral decubitius stag 1-trun freq    Discharge Exam: Vitals:   10/21/18 2050 10/22/18 0502  BP: 125/84 (!) 115/57  Pulse: 64 61  Resp: 20 20  Temp: (!) 97.5 F (36.4 C) 97.7 F (36.5 C)  SpO2: 100% 99%    General: Awake alert sitting in the bed with no distress no current particular issue Cardiovascular: S1-S2 no murmur rub or gallop Respiratory: Clinically clear no added sound Abdomen soft nontender nondistended no rebound no guarding Lower extremities wrapped in Unna boots Neurologically intact pigmentation orients well  Discharge Instructions   Discharge Instructions    Diet - low sodium heart healthy   Complete by:  As directed    Increase activity slowly   Complete by:  As directed      Allergies as of 10/22/2018      Reactions   Penicillins Other (See Comments)   Unknown reaction. TOLERATES CEFTRIAXONE Has patient had a PCN reaction causing immediate rash, facial/tongue/throat swelling, SOB or lightheadedness with hypotension: Unknown Has patient had a PCN reaction causing severe rash involving mucus membranes or skin necrosis:  /Unknown Has patient had a PCN reaction that required hospitalization: Unknown Has patient had a PCN reaction occurring within the last 10 years: Unknown If all of the above answers are "NO", then may proceed with Cephalosporin u   Sulfa Antibiotics Other (See Comments)   Unknown reaction      Medication List    STOP taking these medications   atorvastatin 80 MG tablet Commonly known as:  LIPITOR   loratadine 10 MG tablet Commonly known as:  CLARITIN   losartan 50 MG tablet Commonly known as:  COZAAR     TAKE these medications   acetaminophen 500 MG  tablet Commonly known as:  TYLENOL Take 1,000-1,500 mg by mouth 2 (two) times daily as needed (leg pain).   ascorbic acid 500 MG tablet Commonly known as:  VITAMIN C Take 1 tablet (500 mg total) by mouth 2 (two) times daily.   aspirin EC 81 MG tablet Take 81 mg by mouth daily.   buPROPion 300 MG 24 hr tablet Commonly known as:  WELLBUTRIN XL Take 1 tablet (300 mg total) by mouth daily.   diclofenac sodium 1 % Gel Commonly known as:  VOLTAREN Apply 2 g topically 4 (four) times daily. What changed:  when to take this   escitalopram 5 MG tablet Commonly known as:  LEXAPRO Take 3 tablets (15 mg total) by mouth daily.   gabapentin 400 MG capsule Commonly known as:  NEURONTIN Take 1 capsule (400 mg total) by mouth 3 (three) times daily.   loperamide 2 MG tablet Commonly known as:  IMODIUM A-D Take 2 mg by mouth 4 (four) times daily as needed for diarrhea or loose stools.   menthol-cetylpyridinium 3 MG lozenge Commonly known as:  CEPACOL Take 1 lozenge (3 mg total) by mouth as needed for sore throat.   multivitamin with minerals Tabs tablet Take 1 tablet by mouth daily.   neomycin-polymyxin b-dexamethasone 3.5-10000-0.1 Susp Commonly known as:  MAXITROL Place 1 drop into both eyes every 6 (six) hours.   nutrition supplement (JUVEN) Pack Take 1  packet by mouth 2 (two) times daily between meals.   nystatin powder Commonly known as:  MYCOSTATIN/NYSTOP Apply topically 4 (four) times daily. To groin.   oxybutynin 5 MG tablet Commonly known as:  DITROPAN Take 5 mg by mouth 2 (two) times daily.   potassium chloride SA 20 MEQ tablet Commonly known as:  K-DUR,KLOR-CON Take 2 tablets (40 mEq total) by mouth 2 (two) times daily. What changed:    how much to take  when to take this   RAPAFLO 8 MG Caps capsule Generic drug:  silodosin Take 8 mg by mouth daily.   traMADol 50 MG tablet Commonly known as:  ULTRAM Take 1 tablet (50 mg total) by mouth every 6 (six) hours  as needed (mild pain). What changed:  Another medication with the same name was removed. Continue taking this medication, and follow the directions you see here.      Allergies  Allergen Reactions  . Penicillins Other (See Comments)    Unknown reaction. TOLERATES CEFTRIAXONE  Has patient had a PCN reaction causing immediate rash, facial/tongue/throat swelling, SOB or lightheadedness with hypotension: Unknown Has patient had a PCN reaction causing severe rash involving mucus membranes or skin necrosis:  /Unknown Has patient had a PCN reaction that required hospitalization: Unknown Has patient had a PCN reaction occurring within the last 10 years: Unknown If all of the above answers are "NO", then may proceed with Cephalosporin u  . Sulfa Antibiotics Other (See Comments)    Unknown reaction      The results of significant diagnostics from this hospitalization (including imaging, microbiology, ancillary and laboratory) are listed below for reference.    Significant Diagnostic Studies: Dg Chest 2 View  Result Date: 10/17/2018 CLINICAL DATA:  Weakness, recent fall, low back and sacral pain EXAM: CHEST - 2 VIEW COMPARISON:  Portable chest x-ray of 08/18/2017 FINDINGS: There is opacity at the right lung base consistent with atelectasis versus developing pneumonia. The left lung appears clear. Mediastinal and hilar contours are unremarkable and cardiomegaly is stable. There are degenerative changes throughout the thoracic spine and within the shoulders. IMPRESSION: 1. Linear atelectasis or developing pneumonia at the right lung base. 2. Stable cardiomegaly. Electronically Signed   By: Ivar Drape M.D.   On: 10/17/2018 17:25   Dg Lumbar Spine Complete  Result Date: 10/17/2018 CLINICAL DATA:  Recent fall, low back pain, weakness EXAM: LUMBAR SPINE - COMPLETE 4+ VIEW COMPARISON:  CT abdomen pelvis of 12/09/2009. FINDINGS: There is a lumbar scoliosis convex to the left by approximately 32 degrees.  Significant degenerative change is present with osteophytes from L3-S1. No acute compression deformity is seen. The SI joints are corticated. There is significant degenerative joint disease involving both hips as well. IMPRESSION: 1. Lumbar scoliosis convex to the left with diffuse degenerative spurring and degenerative disc disease. No acute compression deformity. 2. Degenerative change of both hips. Electronically Signed   By: Ivar Drape M.D.   On: 10/17/2018 17:21   Dg Sacrum/coccyx  Result Date: 10/17/2018 CLINICAL DATA:  Increasing weakness with recent fall, low back and sacral pain EXAM: SACRUM AND COCCYX - 2+ VIEW COMPARISON:  None. FINDINGS: The sacrococcygeal elements are in normal alignment. The bones are somewhat osteopenic. No acute fracture is seen. The pelvic rami appear intact and the SI joints are corticated. The sacral foramina appear corticated. There is significant degenerative joint disease of the hips, right greater than left. IMPRESSION: 1. No acute fracture.  Diffuse osteopenia. 2. Significant degenerative joint disease  of the hips, right greater than left. Electronically Signed   By: Ivar Drape M.D.   On: 10/17/2018 17:22    Microbiology: Recent Results (from the past 240 hour(s))  MRSA PCR Screening     Status: None   Collection Time: 10/18/18 12:56 AM  Result Value Ref Range Status   MRSA by PCR NEGATIVE NEGATIVE Final    Comment:        The GeneXpert MRSA Assay (FDA approved for NASAL specimens only), is one component of a comprehensive MRSA colonization surveillance program. It is not intended to diagnose MRSA infection nor to guide or monitor treatment for MRSA infections. Performed at [facility], [place] [address] [place], [place] [address]      Labs: Basic Metabolic Panel: Recent Labs  Lab 10/17/18 1501 10/18/18 0542 10/19/18 0437 10/20/18 0921 10/21/18 0419 10/22/18 0532  NA 138 142 139 136 141 142  K 3.2* 2.9* 3.0* 3.1* 3.4* 3.3*  CL 110  116* 115* 114* 119* 119*  CO2 17* 18* 16* 16* 16* 18*  GLUCOSE 163* 103* 96 99 98 103*  BUN 69* 65* 60* 54* 53* 45*  CREATININE 3.07* 2.98* 2.74* 2.42* 2.30* 2.16*  CALCIUM 8.4* 8.6* 8.1* 8.3* 8.4* 8.6*  MG 2.0 2.0  --  1.9  --   --   PHOS  --   --  3.7 3.3 3.0 2.7   Liver Function Tests: Recent Labs  Lab 10/18/18 0542 10/19/18 0437 10/20/18 0921 10/21/18 0419 10/22/18 0532  AST 35  --   --   --   --   ALT 24  --   --   --   --   ALKPHOS 94  --   --   --   --   BILITOT 0.6  --   --   --   --   PROT 7.1  --   --   --   --   ALBUMIN 2.3* 2.2* 2.2* 2.1* 2.2*   No results for input(s): LIPASE, AMYLASE in the last 168 hours. No results for input(s): AMMONIA in the last 168 hours. CBC: Recent Labs  Lab 10/17/18 1501 10/18/18 0542 10/19/18 0437 10/21/18 0419  WBC 6.4 6.0 7.3 6.7  NEUTROABS  --   --  5.8 4.7  HGB 9.6* 10.2* 9.1* 9.1*  HCT 33.5* 34.1* 30.1* 29.9*  MCV 96.0 94.5 94.4 93.1  PLT 297 280 288 279   Cardiac Enzymes: No results for input(s): CKTOTAL, CKMB, CKMBINDEX, TROPONINI in the last 168 hours. BNP: BNP (last 3 results) Recent Labs    12/06/17 0909  BNP 1,657.7*    ProBNP (last 3 results) No results for input(s): PROBNP in the last 8760 hours.  CBG: No results for input(s): GLUCAP in the last 168 hours.     Signed:  Nita Sells MD   [facility] 10/22/2018, 9:28 AM

## 2018-10-22 NOTE — NC FL2 (Addendum)
[place] MEDICAID FL2 LEVEL OF CARE SCREENING TOOL     IDENTIFICATION  Patient Name: [name] Birthdate: [birth year] Sex: male Admission Date (Current Location): 10/17/2018  [facility] and [place] Number:  Herbalist and Address:  [facility], [address], [place],  [address]      Provider Number: [id number]  Attending Physician Name and Address:  Nita Sells, MD  Relative Name and Phone Number:  Mostafa Yuan [phone]    Current Level of Care: Hospital Recommended Level of Care: [place] Prior Approval Number:    Date Approved/Denied:   PASRR Number: [id number]  Discharge Plan: SNF    Current Diagnoses: Patient Active Problem List   Diagnosis Date Noted  . Pressure injury of skin 10/18/2018  . UTI (urinary tract infection) 10/17/2018  . CKD (chronic kidney disease) stage 3, GFR 30-59 ml/min (HCC) 08/20/2018  . Scrotal edema 08/20/2018  . Stasis ulcer ([place])   . Cellulitis of left leg 08/13/2018  . Anemia 02/19/2018  . Dermatitis associated with moisture from urinary incontinence 02/19/2018  . Acute encephalopathy 02/18/2018  . Acute kidney injury superimposed on CKD ([place]) 02/18/2018  . Dehydration 02/18/2018  . Leukocytosis 02/18/2018  . Cellulitis of leg, right 02/18/2018  . Cellulitis of flank 02/18/2018  . Cellulitis of perineum 02/18/2018  . Cellulitis of buttock   . CAP (community acquired pneumonia) 12/06/2017  . Dyspnea 12/06/2017  . Acute heart failure ([place]) 12/06/2017  . Pleural effusion on right 12/06/2017  . Diabetes ([place])   . Open wound of knee, leg, and ankle 01/01/2011  . [place] SHOULDER REGION 11/23/2010  . Decubitus ulcer of heel 11/16/2010  . ADENOCARCINOMA, PROSTATE 09/13/2010  . Type 2 diabetes mellitus with hyperlipidemia ([place]) 09/13/2010  . Hyperlipidemia 09/13/2010  . OBESITY, MORBID 09/13/2010  . ANXIETY DEPRESSION 09/13/2010  .  HYPERTENSION, BENIGN ESSENTIAL 09/13/2010  . VENOUS STASIS ULCER 09/13/2010  . Venous (peripheral) insufficiency 09/13/2010  . DEGENERATIVE JOINT DISEASE, HIPS 09/13/2010  . Backache 09/13/2010  . GAIT DISTURBANCE 09/13/2010  . INCONTINENCE, URGE 09/13/2010  . HIP REPLACEMENT, BILATERAL, HX OF 09/13/2010    Orientation RESPIRATION BLADDER Height & Weight     Self, Place  Normal Incontinent, External catheter(External urinary catheter placed 08/14/18) Weight: 208 lb 5.4 oz (94.5 kg) Height:     BEHAVIORAL SYMPTOMS/MOOD NEUROLOGICAL BOWEL NUTRITION STATUS      Incontinent Diet(Low sodium, heart healthy)  AMBULATORY STATUS COMMUNICATION OF NEEDS Skin   Total Care(Ambulation deferred due to safety concerns) Verbally Other (Comment)(Stage 1 pressure injury to sacrum-foam dressing ; MASD bilateral groin-barrier cream  ; Ulcer right heel- una boots on both legs)  Foam dressings on right lower heel                     Personal Care Assistance Level of Assistance  Bathing, Feeding, Dressing Bathing Assistance: Maximum assistance Feeding assistance: Limited assistance(Assistance with setup) Dressing Assistance: Maximum assistance     Functional Limitations Info  Sight, Hearing, Speech Sight Info: Adequate Hearing Info: Adequate Speech Info: Adequate    SPECIAL CARE FACTORS FREQUENCY  PT (By licensed PT)     PT Frequency: PT at SNF evaluate and treat              Contractures Contractures Info: Not present    Additional Factors Info  Code Status, Allergies Code Status Info: Full code Allergies Info: Penicillins, Sulfa antibiotics  Current Medications (10/22/2018):  This is the current hospital active medication list Current Facility-Administered Medications  Medication Dose Route Frequency Provider Last Rate Last Dose  . 0.45 % sodium chloride infusion   Intravenous Continuous Nita Sells, MD 50 mL/hr at 10/22/18 0648    . acetaminophen (TYLENOL)  tablet 1,000 mg  1,000 mg Oral BID PRN Elwyn Reach, MD   1,000 mg at 10/22/18 1008  . aspirin EC tablet 81 mg  81 mg Oral Daily Gala Romney L, MD   81 mg at 10/22/18 0930  . atorvastatin (LIPITOR) tablet 80 mg  80 mg Oral Daily Elwyn Reach, MD   80 mg at 10/22/18 0929  . buPROPion (WELLBUTRIN XL) 24 hr tablet 300 mg  300 mg Oral Daily Gala Romney L, MD   300 mg at 10/22/18 0930  . diclofenac sodium (VOLTAREN) 1 % transdermal gel 2 g  2 g Topical TID Elwyn Reach, MD   2 g at 10/22/18 0940  . escitalopram (LEXAPRO) tablet 15 mg  15 mg Oral Daily Elwyn Reach, MD   15 mg at 10/22/18 0929  . gabapentin (NEURONTIN) capsule 400 mg  400 mg Oral TID Gala Romney L, MD   400 mg at 10/22/18 0929  . heparin injection 5,000 Units  5,000 Units Subcutaneous Q8H Elwyn Reach, MD   5,000 Units at 10/22/18 0646  . loperamide (IMODIUM) capsule 2 mg  2 mg Oral QID PRN Elwyn Reach, MD      . menthol-cetylpyridinium (CEPACOL) lozenge 3 mg  1 lozenge Oral PRN Elwyn Reach, MD      . multivitamin with minerals tablet 1 tablet  1 tablet Oral Daily Elwyn Reach, MD   1 tablet at 10/22/18 0929  . nystatin (MYCOSTATIN/NYSTOP) topical powder   Topical QID Gala Romney L, MD      . ondansetron (ZOFRAN) tablet 4 mg  4 mg Oral Q6H PRN Elwyn Reach, MD       Or  . ondansetron (ZOFRAN) injection 4 mg  4 mg Intravenous Q6H PRN Elwyn Reach, MD      . oxybutynin (DITROPAN) tablet 5 mg  5 mg Oral BID Elwyn Reach, MD   5 mg at 10/22/18 0929  . potassium chloride SA (K-DUR,KLOR-CON) CR tablet 40 mEq  40 mEq Oral BID Nita Sells, MD   40 mEq at 10/22/18 0929  . tamsulosin (FLOMAX) capsule 0.4 mg  0.4 mg Oral Daily Gala Romney L, MD   0.4 mg at 10/22/18 0929  . traMADol (ULTRAM) tablet 50 mg  50 mg Oral Q6H PRN Gala Romney L, MD      . vitamin C (ASCORBIC ACID) tablet 500 mg  500 mg Oral BID Gala Romney L, MD   500 mg at 10/22/18 0930      Discharge Medications: Please see discharge summary for a list of discharge medications.  Relevant Imaging Results:  Relevant Lab Results:   Additional Information  [id number]  [facility] [employer] [phone]

## 2018-10-23 DIAGNOSIS — E8809 Other disorders of plasma-protein metabolism, not elsewhere classified: Secondary | ICD-10-CM | POA: Diagnosis not present

## 2018-10-23 DIAGNOSIS — N179 Acute kidney failure, unspecified: Secondary | ICD-10-CM | POA: Diagnosis not present

## 2018-10-23 DIAGNOSIS — E876 Hypokalemia: Secondary | ICD-10-CM | POA: Diagnosis not present

## 2018-10-23 DIAGNOSIS — N390 Urinary tract infection, site not specified: Secondary | ICD-10-CM | POA: Diagnosis not present

## 2018-10-24 DIAGNOSIS — E119 Type 2 diabetes mellitus without complications: Secondary | ICD-10-CM | POA: Diagnosis not present

## 2018-10-24 DIAGNOSIS — N179 Acute kidney failure, unspecified: Secondary | ICD-10-CM | POA: Diagnosis not present

## 2018-10-24 DIAGNOSIS — R29898 Other symptoms and signs involving the musculoskeletal system: Secondary | ICD-10-CM | POA: Diagnosis not present

## 2018-10-24 DIAGNOSIS — N189 Chronic kidney disease, unspecified: Secondary | ICD-10-CM | POA: Diagnosis not present

## 2018-10-28 DIAGNOSIS — I509 Heart failure, unspecified: Secondary | ICD-10-CM | POA: Diagnosis not present

## 2018-10-28 DIAGNOSIS — R634 Abnormal weight loss: Secondary | ICD-10-CM | POA: Diagnosis not present

## 2018-10-28 DIAGNOSIS — I10 Essential (primary) hypertension: Secondary | ICD-10-CM | POA: Diagnosis not present

## 2018-10-28 DIAGNOSIS — R29898 Other symptoms and signs involving the musculoskeletal system: Secondary | ICD-10-CM | POA: Diagnosis not present

## 2018-10-29 DIAGNOSIS — E860 Dehydration: Secondary | ICD-10-CM | POA: Diagnosis not present

## 2018-10-29 DIAGNOSIS — D649 Anemia, unspecified: Secondary | ICD-10-CM | POA: Diagnosis not present

## 2018-10-29 DIAGNOSIS — R634 Abnormal weight loss: Secondary | ICD-10-CM | POA: Diagnosis not present

## 2018-10-30 DIAGNOSIS — F419 Anxiety disorder, unspecified: Secondary | ICD-10-CM | POA: Diagnosis not present

## 2018-10-30 DIAGNOSIS — R410 Disorientation, unspecified: Secondary | ICD-10-CM | POA: Diagnosis not present

## 2018-10-30 DIAGNOSIS — I959 Hypotension, unspecified: Secondary | ICD-10-CM | POA: Diagnosis not present

## 2018-10-30 DIAGNOSIS — F329 Major depressive disorder, single episode, unspecified: Secondary | ICD-10-CM | POA: Diagnosis not present

## 2018-10-30 DIAGNOSIS — I83028 Varicose veins of left lower extremity with ulcer other part of lower leg: Secondary | ICD-10-CM | POA: Diagnosis not present

## 2018-10-30 DIAGNOSIS — L89152 Pressure ulcer of sacral region, stage 2: Secondary | ICD-10-CM | POA: Diagnosis not present

## 2018-11-04 DIAGNOSIS — R531 Weakness: Secondary | ICD-10-CM | POA: Diagnosis not present

## 2018-11-04 DIAGNOSIS — I959 Hypotension, unspecified: Secondary | ICD-10-CM | POA: Diagnosis not present

## 2018-11-05 DIAGNOSIS — I83028 Varicose veins of left lower extremity with ulcer other part of lower leg: Secondary | ICD-10-CM | POA: Diagnosis not present

## 2018-11-05 DIAGNOSIS — L89620 Pressure ulcer of left heel, unstageable: Secondary | ICD-10-CM | POA: Diagnosis not present

## 2018-11-05 DIAGNOSIS — I959 Hypotension, unspecified: Secondary | ICD-10-CM | POA: Diagnosis not present

## 2018-11-05 DIAGNOSIS — E860 Dehydration: Secondary | ICD-10-CM | POA: Diagnosis not present

## 2018-11-06 DIAGNOSIS — I959 Hypotension, unspecified: Secondary | ICD-10-CM | POA: Diagnosis not present

## 2018-11-06 DIAGNOSIS — R531 Weakness: Secondary | ICD-10-CM | POA: Diagnosis not present

## 2018-11-08 ENCOUNTER — Emergency Department (HOSPITAL_COMMUNITY): Payer: Medicare Other

## 2018-11-08 ENCOUNTER — Inpatient Hospital Stay (HOSPITAL_COMMUNITY)
Admission: EM | Admit: 2018-11-08 | Discharge: 2018-12-11 | DRG: 871 | Disposition: E | Payer: Medicare Other | Attending: Internal Medicine | Admitting: Internal Medicine

## 2018-11-08 ENCOUNTER — Encounter (HOSPITAL_COMMUNITY): Payer: Self-pay | Admitting: Emergency Medicine

## 2018-11-08 DIAGNOSIS — Z66 Do not resuscitate: Secondary | ICD-10-CM | POA: Diagnosis present

## 2018-11-08 DIAGNOSIS — Z515 Encounter for palliative care: Secondary | ICD-10-CM | POA: Diagnosis present

## 2018-11-08 DIAGNOSIS — L89152 Pressure ulcer of sacral region, stage 2: Secondary | ICD-10-CM | POA: Diagnosis present

## 2018-11-08 DIAGNOSIS — Z79891 Long term (current) use of opiate analgesic: Secondary | ICD-10-CM

## 2018-11-08 DIAGNOSIS — N189 Chronic kidney disease, unspecified: Secondary | ICD-10-CM

## 2018-11-08 DIAGNOSIS — E872 Acidosis, unspecified: Secondary | ICD-10-CM | POA: Diagnosis present

## 2018-11-08 DIAGNOSIS — I130 Hypertensive heart and chronic kidney disease with heart failure and stage 1 through stage 4 chronic kidney disease, or unspecified chronic kidney disease: Secondary | ICD-10-CM | POA: Diagnosis present

## 2018-11-08 DIAGNOSIS — E860 Dehydration: Secondary | ICD-10-CM

## 2018-11-08 DIAGNOSIS — E785 Hyperlipidemia, unspecified: Secondary | ICD-10-CM | POA: Diagnosis present

## 2018-11-08 DIAGNOSIS — Z8546 Personal history of malignant neoplasm of prostate: Secondary | ICD-10-CM

## 2018-11-08 DIAGNOSIS — B961 Klebsiella pneumoniae [K. pneumoniae] as the cause of diseases classified elsewhere: Secondary | ICD-10-CM | POA: Diagnosis present

## 2018-11-08 DIAGNOSIS — J90 Pleural effusion, not elsewhere classified: Secondary | ICD-10-CM | POA: Diagnosis not present

## 2018-11-08 DIAGNOSIS — I10 Essential (primary) hypertension: Secondary | ICD-10-CM | POA: Diagnosis not present

## 2018-11-08 DIAGNOSIS — R0902 Hypoxemia: Secondary | ICD-10-CM | POA: Diagnosis not present

## 2018-11-08 DIAGNOSIS — A419 Sepsis, unspecified organism: Secondary | ICD-10-CM | POA: Diagnosis present

## 2018-11-08 DIAGNOSIS — E1122 Type 2 diabetes mellitus with diabetic chronic kidney disease: Secondary | ICD-10-CM | POA: Diagnosis present

## 2018-11-08 DIAGNOSIS — R6521 Severe sepsis with septic shock: Secondary | ICD-10-CM | POA: Diagnosis present

## 2018-11-08 DIAGNOSIS — N183 Chronic kidney disease, stage 3 (moderate): Secondary | ICD-10-CM | POA: Diagnosis present

## 2018-11-08 DIAGNOSIS — E11649 Type 2 diabetes mellitus with hypoglycemia without coma: Secondary | ICD-10-CM | POA: Diagnosis present

## 2018-11-08 DIAGNOSIS — Z880 Allergy status to penicillin: Secondary | ICD-10-CM

## 2018-11-08 DIAGNOSIS — E1169 Type 2 diabetes mellitus with other specified complication: Secondary | ICD-10-CM | POA: Diagnosis present

## 2018-11-08 DIAGNOSIS — N390 Urinary tract infection, site not specified: Secondary | ICD-10-CM | POA: Diagnosis present

## 2018-11-08 DIAGNOSIS — R680 Hypothermia, not associated with low environmental temperature: Secondary | ICD-10-CM | POA: Diagnosis present

## 2018-11-08 DIAGNOSIS — I872 Venous insufficiency (chronic) (peripheral): Secondary | ICD-10-CM | POA: Diagnosis present

## 2018-11-08 DIAGNOSIS — Z96643 Presence of artificial hip joint, bilateral: Secondary | ICD-10-CM | POA: Diagnosis present

## 2018-11-08 DIAGNOSIS — N179 Acute kidney failure, unspecified: Secondary | ICD-10-CM | POA: Diagnosis present

## 2018-11-08 DIAGNOSIS — G9340 Encephalopathy, unspecified: Secondary | ICD-10-CM | POA: Diagnosis not present

## 2018-11-08 DIAGNOSIS — N342 Other urethritis: Secondary | ICD-10-CM | POA: Diagnosis not present

## 2018-11-08 DIAGNOSIS — Z7982 Long term (current) use of aspirin: Secondary | ICD-10-CM

## 2018-11-08 DIAGNOSIS — G9341 Metabolic encephalopathy: Secondary | ICD-10-CM | POA: Diagnosis present

## 2018-11-08 DIAGNOSIS — I5032 Chronic diastolic (congestive) heart failure: Secondary | ICD-10-CM | POA: Diagnosis present

## 2018-11-08 DIAGNOSIS — Z882 Allergy status to sulfonamides status: Secondary | ICD-10-CM

## 2018-11-08 DIAGNOSIS — I959 Hypotension, unspecified: Secondary | ICD-10-CM | POA: Diagnosis not present

## 2018-11-08 DIAGNOSIS — E1142 Type 2 diabetes mellitus with diabetic polyneuropathy: Secondary | ICD-10-CM | POA: Diagnosis present

## 2018-11-08 DIAGNOSIS — Z79899 Other long term (current) drug therapy: Secondary | ICD-10-CM

## 2018-11-08 DIAGNOSIS — R945 Abnormal results of liver function studies: Secondary | ICD-10-CM

## 2018-11-08 DIAGNOSIS — L8990 Pressure ulcer of unspecified site, unspecified stage: Secondary | ICD-10-CM | POA: Diagnosis present

## 2018-11-08 DIAGNOSIS — F0390 Unspecified dementia without behavioral disturbance: Secondary | ICD-10-CM | POA: Diagnosis present

## 2018-11-08 DIAGNOSIS — Z8249 Family history of ischemic heart disease and other diseases of the circulatory system: Secondary | ICD-10-CM

## 2018-11-08 DIAGNOSIS — I878 Other specified disorders of veins: Secondary | ICD-10-CM | POA: Diagnosis present

## 2018-11-08 DIAGNOSIS — I4510 Unspecified right bundle-branch block: Secondary | ICD-10-CM | POA: Diagnosis not present

## 2018-11-08 DIAGNOSIS — R404 Transient alteration of awareness: Secondary | ICD-10-CM | POA: Diagnosis not present

## 2018-11-08 DIAGNOSIS — R7989 Other specified abnormal findings of blood chemistry: Secondary | ICD-10-CM | POA: Diagnosis present

## 2018-11-08 LAB — CBC WITH DIFFERENTIAL/PLATELET
Abs Immature Granulocytes: 0.02 10*3/uL (ref 0.00–0.07)
Basophils Absolute: 0 10*3/uL (ref 0.0–0.1)
Basophils Relative: 0 %
Eosinophils Absolute: 0.1 10*3/uL (ref 0.0–0.5)
Eosinophils Relative: 1 %
HCT: 31.5 % — ABNORMAL LOW (ref 39.0–52.0)
Hemoglobin: 9.1 g/dL — ABNORMAL LOW (ref 13.0–17.0)
Immature Granulocytes: 0 %
Lymphocytes Relative: 8 %
Lymphs Abs: 0.5 10*3/uL — ABNORMAL LOW (ref 0.7–4.0)
MCH: 28.3 pg (ref 26.0–34.0)
MCHC: 28.9 g/dL — ABNORMAL LOW (ref 30.0–36.0)
MCV: 98.1 fL (ref 80.0–100.0)
Monocytes Absolute: 0.6 10*3/uL (ref 0.1–1.0)
Monocytes Relative: 8 %
Neutro Abs: 5.6 10*3/uL (ref 1.7–7.7)
Neutrophils Relative %: 83 %
Platelets: 238 10*3/uL (ref 150–400)
RBC: 3.21 MIL/uL — ABNORMAL LOW (ref 4.22–5.81)
RDW: 17.2 % — ABNORMAL HIGH (ref 11.5–15.5)
WBC: 6.8 10*3/uL (ref 4.0–10.5)
nRBC: 0 % (ref 0.0–0.2)

## 2018-11-08 LAB — BASIC METABOLIC PANEL
Anion gap: 10 (ref 5–15)
BUN: 78 mg/dL — ABNORMAL HIGH (ref 8–23)
CO2: 8 mmol/L — ABNORMAL LOW (ref 22–32)
CREATININE: 4.9 mg/dL — AB (ref 0.61–1.24)
Calcium: 8.6 mg/dL — ABNORMAL LOW (ref 8.9–10.3)
Chloride: 121 mmol/L — ABNORMAL HIGH (ref 98–111)
GFR calc Af Amer: 12 mL/min — ABNORMAL LOW (ref >60)
GFR calc non Af Amer: 11 mL/min — ABNORMAL LOW (ref >60)
Glucose, Bld: 70 mg/dL (ref 70–99)
Potassium: 3.9 mmol/L (ref 3.5–5.1)
Sodium: 139 mmol/L (ref 135–145)

## 2018-11-08 LAB — COMPREHENSIVE METABOLIC PANEL
ALT: 26 U/L (ref 0–44)
AST: 49 U/L — ABNORMAL HIGH (ref 15–41)
Albumin: 2.4 g/dL — ABNORMAL LOW (ref 3.5–5.0)
Alkaline Phosphatase: 100 U/L (ref 38–126)
Anion gap: 10 (ref 5–15)
BUN: 81 mg/dL — ABNORMAL HIGH (ref 8–23)
CO2: 11 mmol/L — ABNORMAL LOW (ref 22–32)
Calcium: 8.9 mg/dL (ref 8.9–10.3)
Chloride: 117 mmol/L — ABNORMAL HIGH (ref 98–111)
Creatinine, Ser: 5.06 mg/dL — ABNORMAL HIGH (ref 0.61–1.24)
GFR calc Af Amer: 12 mL/min — ABNORMAL LOW (ref >60)
GFR calc non Af Amer: 10 mL/min — ABNORMAL LOW (ref >60)
Glucose, Bld: 81 mg/dL (ref 70–99)
Potassium: 3.7 mmol/L (ref 3.5–5.1)
Sodium: 138 mmol/L (ref 135–145)
Total Bilirubin: 0.9 mg/dL (ref 0.3–1.2)
Total Protein: 7.1 g/dL (ref 6.5–8.1)

## 2018-11-08 LAB — URINALYSIS, ROUTINE W REFLEX MICROSCOPIC

## 2018-11-08 LAB — URINALYSIS, MICROSCOPIC (REFLEX): Squamous Epithelial / HPF: NONE SEEN (ref 0–5)

## 2018-11-08 LAB — I-STAT CG4 LACTIC ACID, ED: Lactic Acid, Venous: 0.62 mmol/L (ref 0.5–1.9)

## 2018-11-08 LAB — GLUCOSE, CAPILLARY
Glucose-Capillary: 71 mg/dL (ref 70–99)
Glucose-Capillary: 85 mg/dL (ref 70–99)

## 2018-11-08 MED ORDER — METRONIDAZOLE IN NACL 5-0.79 MG/ML-% IV SOLN
500.0000 mg | Freq: Three times a day (TID) | INTRAVENOUS | Status: DC
  Administered 2018-11-08 – 2018-11-09 (×2): 500 mg via INTRAVENOUS
  Filled 2018-11-08 (×2): qty 100

## 2018-11-08 MED ORDER — ACETAMINOPHEN 325 MG PO TABS: 650.0000 mg | ORAL_TABLET | Freq: Four times a day (QID) | ORAL | Status: DC | PRN

## 2018-11-08 MED ORDER — VANCOMYCIN HCL IN DEXTROSE 1-5 GM/200ML-% IV SOLN: 1000.0000 mg | INTRAVENOUS | Status: DC

## 2018-11-08 MED ORDER — SODIUM CHLORIDE 0.9 % IV BOLUS
1000.0000 mL | Freq: Once | INTRAVENOUS | Status: AC
  Administered 2018-11-08: 1000 mL via INTRAVENOUS

## 2018-11-08 MED ORDER — ONDANSETRON HCL 4 MG/2ML IJ SOLN: 4.0000 mg | Freq: Four times a day (QID) | INTRAMUSCULAR | Status: DC | PRN

## 2018-11-08 MED ORDER — POTASSIUM CHLORIDE IN NACL 20-0.9 MEQ/L-% IV SOLN
INTRAVENOUS | Status: DC
  Administered 2018-11-08: 1000 mL via INTRAVENOUS
  Filled 2018-11-08 (×2): qty 1000

## 2018-11-08 MED ORDER — DEXTROSE 50 % IV SOLN
INTRAVENOUS | Status: AC
  Administered 2018-11-08: 50 mL
  Filled 2018-11-08: qty 50

## 2018-11-08 MED ORDER — ENOXAPARIN SODIUM 30 MG/0.3ML ~~LOC~~ SOLN
30.0000 mg | SUBCUTANEOUS | Status: DC
  Administered 2018-11-09: 30 mg via SUBCUTANEOUS
  Filled 2018-11-08: qty 0.3

## 2018-11-08 MED ORDER — ONDANSETRON HCL 4 MG PO TABS: 4.0000 mg | ORAL_TABLET | Freq: Four times a day (QID) | ORAL | Status: DC | PRN

## 2018-11-08 MED ORDER — SODIUM CHLORIDE 0.9 % IV SOLN
2.0000 g | Freq: Once | INTRAVENOUS | Status: AC
  Administered 2018-11-08: 2 g via INTRAVENOUS
  Filled 2018-11-08: qty 2

## 2018-11-08 MED ORDER — VANCOMYCIN HCL IN DEXTROSE 1-5 GM/200ML-% IV SOLN: 1000.0000 mg | Freq: Once | INTRAVENOUS | Status: DC

## 2018-11-08 MED ORDER — SODIUM CHLORIDE 0.9 % IV SOLN
1.0000 g | INTRAVENOUS | Status: DC
  Filled 2018-11-08: qty 1

## 2018-11-08 MED ORDER — INSULIN ASPART 100 UNIT/ML ~~LOC~~ SOLN: 0.0000 [IU] | Freq: Four times a day (QID) | SUBCUTANEOUS | Status: DC

## 2018-11-08 MED ORDER — SODIUM CHLORIDE 0.9 % IV SOLN: 2.0000 g | Freq: Once | INTRAVENOUS | Status: DC

## 2018-11-08 MED ORDER — VANCOMYCIN HCL 10 G IV SOLR
2000.0000 mg | Freq: Once | INTRAVENOUS | Status: AC
  Administered 2018-11-08: 2000 mg via INTRAVENOUS
  Filled 2018-11-08: qty 2000

## 2018-11-08 MED ORDER — ACETAMINOPHEN 650 MG RE SUPP: 650.0000 mg | Freq: Four times a day (QID) | RECTAL | Status: DC | PRN

## 2018-11-08 MED ORDER — INSULIN ASPART 100 UNIT/ML ~~LOC~~ SOLN: 0.0000 [IU] | Freq: Every day | SUBCUTANEOUS | Status: DC

## 2018-11-08 NOTE — Progress Notes (Signed)
Pharmacy Antibiotic Note  [name] is a 77 y.o. male admitted on 11/01/2018 with sepsis.  Pharmacy has been consulted for vancomycin and cefepime dosing.  Noted pcn allergy, has tolerated cefepime in past.  WBC wnl, LA 0.62, hypotensive.   SCr 5.08 , CrCl ~ 18 mL/min  Plan: Vancomycin 2000mg  IV x 1 in ED, then 1000mg  IV every 48 hours Cefepime 2g IV in ED, then 1g IV every 24 hours F/u Cx, renal function, clinical progression and ability to narrow Vancomycin level at steady state    No data recorded.  Recent Labs  Lab 11/07/2018 1103 10/12/2018 1111  WBC 6.8  --   LATICACIDVEN  --  0.62    CrCl cannot be calculated (Unknown ideal weight.).    Allergies  Allergen Reactions  . Penicillins Other (See Comments)    Unknown reaction. TOLERATES CEFTRIAXONE  Has patient had a PCN reaction causing immediate rash, facial/tongue/throat swelling, SOB or lightheadedness with hypotension: Unknown Has patient had a PCN reaction causing severe rash involving mucus membranes or skin necrosis:  /Unknown Has patient had a PCN reaction that required hospitalization: Unknown Has patient had a PCN reaction occurring within the last 10 years: Unknown If all of the above answers are "NO", then may proceed with Cephalosporin u  . Sulfa Antibiotics Other (See Comments)    Unknown reaction    Antimicrobials this admission: Vanc 11/29>> Cefepime 11/29>> Flagyl 11/29>>  Dose adjustments this admission: n/a  Microbiology results: 11/29 BCx: sent  Bertis Ruddy, PharmD Clinical Pharmacist Please check AMION for all [place] numbers 10/18/2018 11:34 AM

## 2018-11-08 NOTE — Progress Notes (Signed)
Patient trasfered from ED to 5W29 via stretcher; alert and oriented x 1, confused; no S/S of pain; IV saline locked in LFA running fluids @ KVO; placed stepdown monitor per MD order. Orient patient and family to room and unit; gave patient care guide; instructed how to use the call bell and  fall risk precautions. Will continue to monitor the patient.

## 2018-11-08 NOTE — ED Notes (Signed)
Condom Cath applied  

## 2018-11-08 NOTE — ED Notes (Signed)
Pt has sacral ulcer, wound on left shin and left hell. All bandages removed and clean sterile gauze applied. Bed sore pad applied to sacral wound.

## 2018-11-08 NOTE — ED Notes (Signed)
Report given to [facility] RN.

## 2018-11-08 NOTE — ED Provider Notes (Signed)
[facility] EMERGENCY DEPARTMENT Provider Note   CSN: [id number] Arrival date & time: 10/17/2018  1032     History   Chief Complaint Chief Complaint  Patient presents with  . Hypotension    HPI [name] is a 77 y.o. male.  HPI Patient presents to the emergency department with hypotension and some alterations in his normal mentation.  The nursing home states that they he had to give him a liter of fluid on November 25 due to hypotension and then 2 L of fluid November 26 for hypotension.  The patient is not able to give me much history.  The patient has been admitted to the hospital several times for pneumonia and UTI recently. Past Medical History:  Diagnosis Date  . CAP (community acquired pneumonia)   . CHF (congestive heart failure) ([place])   . Diabetes ([place])   . Hyperlipidemia   . Hypertension   . Hypotension   . Pleural effusion on right   . Prostate cancer ([place])   . Ulcers of both lower extremities ([place])   . Venous insufficiency (chronic) (peripheral)     Patient Active Problem List   Diagnosis Date Noted  . Severe sepsis with septic shock ([place]) 10/25/2018  . Elevated LFTs 11/07/2018  . Pressure injury of skin 10/18/2018  . UTI (urinary tract infection) 10/17/2018  . CKD (chronic kidney disease) stage 3, GFR 30-59 ml/min (HCC) 08/20/2018  . Scrotal edema 08/20/2018  . Stasis ulcer ([place])   . Cellulitis of left leg 08/13/2018  . Anemia 02/19/2018  . Dermatitis associated with moisture from urinary incontinence 02/19/2018  . Acute encephalopathy 02/18/2018  . Acute kidney injury superimposed on CKD ([place]) 02/18/2018  . Dehydration 02/18/2018  . Leukocytosis 02/18/2018  . Cellulitis of leg, right 02/18/2018  . Cellulitis of flank 02/18/2018  . Cellulitis of perineum 02/18/2018  . Cellulitis of buttock   . CAP (community acquired pneumonia) 12/06/2017  . Dyspnea 12/06/2017  . Chronic diastolic CHF (congestive heart failure) ([place])  12/06/2017  . Pleural effusion on right 12/06/2017  . Diabetes ([place])   . Open wound of knee, leg, and ankle 01/01/2011  . [place] SHOULDER REGION 11/23/2010  . Decubitus ulcer of heel 11/16/2010  . ADENOCARCINOMA, PROSTATE 09/13/2010  . Type 2 diabetes mellitus with hyperlipidemia ([place]) 09/13/2010  . Hyperlipidemia 09/13/2010  . OBESITY, MORBID 09/13/2010  . ANXIETY DEPRESSION 09/13/2010  . HYPERTENSION, BENIGN ESSENTIAL 09/13/2010  . VENOUS STASIS ULCER 09/13/2010  . Venous (peripheral) insufficiency 09/13/2010  . DEGENERATIVE JOINT DISEASE, HIPS 09/13/2010  . Backache 09/13/2010  . GAIT DISTURBANCE 09/13/2010  . INCONTINENCE, URGE 09/13/2010  . HIP REPLACEMENT, BILATERAL, HX OF 09/13/2010    History reviewed. No pertinent surgical history.      Home Medications    Prior to Admission medications   Medication Sig Start Date End Date Taking? Authorizing Provider  acetaminophen (TYLENOL) 500 MG tablet Take 1,000-1,500 mg by mouth 2 (two) times daily as needed (leg pain).   Yes [provider]  aspirin EC 81 MG tablet Take 81 mg by mouth daily.   Yes [provider]  buPROPion (BUDEPRION XL) 300 MG 24 hr tablet Take 1 tablet (300 mg total) by mouth daily. 10/22/18  Yes Nita Sells, MD  diclofenac sodium (VOLTAREN) 1 % GEL Apply 2 g topically 4 (four) times daily. Patient taking differently: Apply 2 g topically 4 (four) times daily. On the right knee for pain 08/20/18  Yes Debbe Odea, MD  escitalopram (LEXAPRO) 5 MG tablet Take 3 tablets (15 mg total) by mouth daily. 10/22/18  Yes Nita Sells, MD  gabapentin (NEURONTIN) 400 MG capsule Take 1 capsule (400 mg total) by mouth 3 (three) times daily. 10/22/18  Yes Nita Sells, MD  loperamide (IMODIUM A-D) 2 MG tablet Take 2 mg by mouth 4 (four) times daily as needed for diarrhea or loose stools.   Yes [provider]  menthol-cetylpyridinium (CEPACOL) 3 MG  lozenge Take 1 lozenge (3 mg total) by mouth as needed for sore throat. 08/20/18  Yes Debbe Odea, MD  Multiple Vitamin (MULTIVITAMIN WITH MINERALS) TABS tablet Take 1 tablet by mouth daily. 08/20/18  Yes Debbe Odea, MD  neomycin-polymyxin b-dexamethasone (MAXITROL) 3.5-10000-0.1 SUSP Place 1 drop into both eyes every 6 (six) hours. Patient taking differently: Place 1 drop into both eyes every 6 (six) hours. For inflammation 08/20/18  Yes Rizwan, Eunice Blase, MD  nystatin (MYCOSTATIN/NYSTOP) powder Apply topically 4 (four) times daily. To groin. 08/09/18  Yes Mesner, Corene Cornea, MD  oxybutynin (DITROPAN) 5 MG tablet Take 5 mg by mouth 2 (two) times daily. 01/22/18  Yes [provider]  potassium chloride SA (K-DUR,KLOR-CON) 20 MEQ tablet Take 2 tablets (40 mEq total) by mouth 2 (two) times daily. 10/22/18  Yes Nita Sells, MD  Silodosin (RAPAFLO) 8 MG CAPS Take 8 mg by mouth daily.    Yes [provider]  vitamin C (VITAMIN C) 500 MG tablet Take 1 tablet (500 mg total) by mouth 2 (two) times daily. 08/20/18  Yes Debbe Odea, MD  nutrition supplement, JUVEN, (JUVEN) PACK Take 1 packet by mouth 2 (two) times daily between meals. Patient not taking: Reported on 10/17/2018 08/20/18   Debbe Odea, MD  traMADol (ULTRAM) 50 MG tablet Take 1 tablet (50 mg total) by mouth every 6 (six) hours as needed (mild pain). Patient not taking: Reported on 10/25/2018 10/22/18   Nita Sells, MD    Family History Family History  Problem Relation Age of Onset  . Coronary artery disease Father   . Coronary artery disease Mother   . Dementia Mother   . Coronary artery disease Sister     Social History Social History   Tobacco Use  . Smoking status: Never Smoker  . Smokeless tobacco: Never Used  Substance Use Topics  . Alcohol use: Yes    Alcohol/week: 1.0 standard drinks    Types: 1 Cans of beer per week    Frequency: Never  . Drug use: No     Allergies   Penicillins and  Sulfa antibiotics   Review of Systems Review of Systems Level 5 caveat applies due to altered mental status  Physical Exam Updated Vital Signs BP (!) 90/51   Pulse 76   Temp (!) 96.1 F (35.6 C) (Rectal)   Resp 13   SpO2 96%   Physical Exam  Constitutional: He appears well-developed and well-nourished. No distress.  HENT:  Head: Normocephalic and atraumatic.  Mouth/Throat: Oropharynx is clear and moist.  Eyes: Pupils are equal, round, and reactive to light.  Neck: Normal range of motion. Neck supple.  Cardiovascular: Normal rate, regular rhythm and normal heart sounds. Exam reveals no gallop and no friction rub.  No murmur heard. Pulmonary/Chest: Effort normal and breath sounds normal. No respiratory distress. He has no wheezes.  Abdominal: Soft. Bowel sounds are normal. He exhibits no distension and no mass. There is no tenderness. There is no guarding.  Neurological: He is alert. He exhibits normal muscle tone. Coordination  normal.  Skin: Skin is warm and dry. Capillary refill takes less than 2 seconds. No rash noted. No erythema.  Psychiatric: He has a normal mood and affect. His behavior is normal.  Nursing note and vitals reviewed.    ED Treatments / Results  Labs (all labs ordered are listed, but only abnormal results are displayed) Labs Reviewed  COMPREHENSIVE METABOLIC PANEL - Abnormal; Notable for the following components:      Result Value   Chloride 117 (*)    CO2 11 (*)    BUN 81 (*)    Creatinine, Ser 5.06 (*)    Albumin 2.4 (*)    AST 49 (*)    GFR calc non Af Amer 10 (*)    GFR calc Af Amer 12 (*)    All other components within normal limits  CBC WITH DIFFERENTIAL/PLATELET - Abnormal; Notable for the following components:   RBC 3.21 (*)    Hemoglobin 9.1 (*)    HCT 31.5 (*)    MCHC 28.9 (*)    RDW 17.2 (*)    Lymphs Abs 0.5 (*)    All other components within normal limits  URINALYSIS, ROUTINE W REFLEX MICROSCOPIC - Abnormal; Notable for the  following components:   APPearance TURBID (*)    Glucose, UA   (*)    Value: TEST NOT REPORTED DUE TO COLOR INTERFERENCE OF URINE PIGMENT   Hgb urine dipstick   (*)    Value: TEST NOT REPORTED DUE TO COLOR INTERFERENCE OF URINE PIGMENT   Bilirubin Urine   (*)    Value: TEST NOT REPORTED DUE TO COLOR INTERFERENCE OF URINE PIGMENT   Ketones, ur   (*)    Value: TEST NOT REPORTED DUE TO COLOR INTERFERENCE OF URINE PIGMENT   Protein, ur   (*)    Value: TEST NOT REPORTED DUE TO COLOR INTERFERENCE OF URINE PIGMENT   Nitrite   (*)    Value: TEST NOT REPORTED DUE TO COLOR INTERFERENCE OF URINE PIGMENT   Leukocytes, UA   (*)    Value: TEST NOT REPORTED DUE TO COLOR INTERFERENCE OF URINE PIGMENT   All other components within normal limits  URINALYSIS, MICROSCOPIC (REFLEX) - Abnormal; Notable for the following components:   Bacteria, UA MANY (*)    Non Squamous Epithelial PRESENT (*)    All other components within normal limits  CULTURE, BLOOD (ROUTINE X 2)  CULTURE, BLOOD (ROUTINE X 2)  URINE CULTURE  I-STAT CG4 LACTIC ACID, ED  I-STAT CG4 LACTIC ACID, ED    EKG EKG Interpretation  Date/Time:  Friday November 08 2018 10:41:00 EST Ventricular Rate:  72 PR Interval:    QRS Duration: 195 QT Interval:  495 QTC Calculation: 542 R Axis:   -128 Text Interpretation:  Atrial fibrillation Ventricular premature complex Right bundle branch block No significant change since last tracing Confirmed by Orlie Dakin [phone]) on 10/20/2018 11:48:02 AM   Radiology Dg Chest Port 1 View  Result Date: 10/14/2018 CLINICAL DATA:  Hypotension. EXAM: PORTABLE CHEST 1 VIEW COMPARISON:  10/17/2018 12/06/2017. FINDINGS: Cardiomegaly. Diffuse bilateral mild interstitial prominence. Although these changes are most likely chronic a mild component of CHF cannot be excluded. Pneumonitis can't be excluded. Tiny right pleural effusion. No pneumothorax. IMPRESSION: 1.  Cardiomegaly. 2. Diffuse bilateral mild  interstitial prominence. Although these changes are most likely chronic a mild component of CHF cannot be excluded. Pneumonitis cannot be excluded. Tiny right pleural effusion. Electronically Signed   By: Marcello Moores  Register  On: 10/30/2018 11:25    Procedures Procedures (including critical care time)  Medications Ordered in ED Medications  metroNIDAZOLE (FLAGYL) IVPB 500 mg (has no administration in time range)  ceFEPIme (MAXIPIME) 1 g in sodium chloride 0.9 % 100 mL IVPB (has no administration in time range)  vancomycin (VANCOCIN) IVPB 1000 mg/200 mL premix (has no administration in time range)  vancomycin (VANCOCIN) 2,000 mg in sodium chloride 0.9 % 500 mL IVPB (2,000 mg Intravenous New Bag/Given 10/18/2018 1304)  ceFEPIme (MAXIPIME) 2 g in sodium chloride 0.9 % 100 mL IVPB (0 g Intravenous Stopped 11/07/2018 1257)  sodium chloride 0.9 % bolus 1,000 mL (0 mLs Intravenous Stopped 10/30/2018 1433)     Initial Impression / Assessment and Plan / ED Course  I have reviewed the triage vital signs and the nursing notes.  Pertinent labs & imaging results that were available during my care of the patient were reviewed by me and considered in my medical decision making (see chart for details).    CRITICAL CARE Performed by: Resa Miner Azavion Bouillon Total critical care time: 30 minutes Critical care time was exclusive of separately billable procedures and treating other patients. Critical care was necessary to treat or prevent imminent or life-threatening deterioration. Critical care was time spent personally by me on the following activities: development of treatment plan with patient and/or surrogate as well as nursing, discussions with consultants, evaluation of patient's response to treatment, examination of patient, obtaining history from patient or surrogate, ordering and performing treatments and interventions, ordering and review of laboratory studies, ordering and review of radiographic studies,  pulse oximetry and re-evaluation of patient's condition. Patient was given 3 L of fluid along with broad-spectrum antibiotics as part of the sepsis protocol.  Final Clinical Impressions(s) / ED Diagnoses   Final diagnoses:  Dehydration  Lower urinary tract infectious disease    ED Discharge Orders    None       Dalia Heading, PA-C 10/21/2018 1541    Orlie Dakin, MD 10/25/2018 1600

## 2018-11-08 NOTE — ED Triage Notes (Signed)
Pt arrives from [facility] rehab for hypotension- recurrent problem. Pt was recently treated for UTI, dehydration and kidney failure. Per ems pt has been lethargic and less alert. Pt is alert on arrival and oriented to person and place.

## 2018-11-08 NOTE — ED Notes (Signed)
Pt repositioned onto right side. Family at beside.

## 2018-11-08 NOTE — ED Notes (Signed)
Family at bedside. 

## 2018-11-08 NOTE — H&P (Signed)
History and Physical  TAVIOUS GRIESINGER ZOX:[id number] DOB: [birth year] DOA: 10/15/2018  Referring physician: Glean Salvo PCP: Kathyrn Lass, MD  Outpatient Specialists:   Patient Coming From: nursing home  Chief Complaint: hypotension and hypothermia  HPI: ACHILLE XIANG is a 77 y.o. male with a history of diabetes, grade 2 diastolic heart failure with preserved EF, hypertension, hyperlipidemia, prostate cancer.  Patient [place] living at nursing home.  Patient has altered mental status and cannot provide history. Family is not in the room, nor the available by telephone.  History obtained by the chart and from the EDP.  Patient was hospitalized at the beginning of the month with UTI.  He was placed in the nursing home is continued to have some mild hypotensive spells.  Patient received IV fluid boluses in order to correct this.  Today, the patient had significant hypotension into the 70s with hypothermia and confusion.  EMS was called.  Patient received 200 of IV fluids.  Here, the patient received 3 L IV fluids.  Lactic acid is normal, white count is 0.8.  His urine is turbid and unable to result.  The patient continues to be encephalopathic, although after fluids, his blood pressure is in the 90s to low 811B systolically.  His MAP is also approximately 60.  Patient had blood cultures drawn and broad-spectrum antibiotics: Vancomycin, cefepime, Flagyl.   Review of Systems:  Able to obtain  Past Medical History:  Diagnosis Date  . CAP (community acquired pneumonia)   . CHF (congestive heart failure) ([place])   . Diabetes ([place])   . Hyperlipidemia   . Hypertension   . Hypotension   . Pleural effusion on right   . Prostate cancer ([place])   . Ulcers of both lower extremities ([place])   . Venous insufficiency (chronic) (peripheral)    History reviewed. No pertinent surgical history. Social History:  reports that he has never smoked. He has never used smokeless tobacco. He reports that he drinks about  1.0 standard drinks of alcohol per week. He reports that he does not use drugs. Patient lives at nursing home  Allergies  Allergen Reactions  . Penicillins Other (See Comments)    Unknown reaction. TOLERATES CEFTRIAXONE  Has patient had a PCN reaction causing immediate rash, facial/tongue/throat swelling, SOB or lightheadedness with hypotension: Unknown Has patient had a PCN reaction causing severe rash involving mucus membranes or skin necrosis:  /Unknown Has patient had a PCN reaction that required hospitalization: Unknown Has patient had a PCN reaction occurring within the last 10 years: Unknown If all of the above answers are "NO", then may proceed with Cephalosporin u  . Sulfa Antibiotics Other (See Comments)    Unknown reaction    Family History  Problem Relation Age of Onset  . Coronary artery disease Father   . Coronary artery disease Mother   . Dementia Mother   . Coronary artery disease Sister       Prior to Admission medications   Medication Sig Start Date End Date Taking? Authorizing Provider  acetaminophen (TYLENOL) 500 MG tablet Take 1,000-1,500 mg by mouth 2 (two) times daily as needed (leg pain).   Yes [provider]  aspirin EC 81 MG tablet Take 81 mg by mouth daily.   Yes [provider]  buPROPion (BUDEPRION XL) 300 MG 24 hr tablet Take 1 tablet (300 mg total) by mouth daily. 10/22/18  Yes Nita Sells, MD  diclofenac sodium (VOLTAREN) 1 % GEL Apply 2 g topically 4 (four) times  daily. Patient taking differently: Apply 2 g topically 4 (four) times daily. On the right knee for pain 08/20/18  Yes Rizwan, Eunice Blase, MD  escitalopram (LEXAPRO) 5 MG tablet Take 3 tablets (15 mg total) by mouth daily. 10/22/18  Yes Nita Sells, MD  gabapentin (NEURONTIN) 400 MG capsule Take 1 capsule (400 mg total) by mouth 3 (three) times daily. 10/22/18  Yes Nita Sells, MD  loperamide (IMODIUM A-D) 2 MG tablet Take 2 mg by mouth 4 (four) times  daily as needed for diarrhea or loose stools.   Yes [provider]  menthol-cetylpyridinium (CEPACOL) 3 MG lozenge Take 1 lozenge (3 mg total) by mouth as needed for sore throat. 08/20/18  Yes Debbe Odea, MD  Multiple Vitamin (MULTIVITAMIN WITH MINERALS) TABS tablet Take 1 tablet by mouth daily. 08/20/18  Yes Debbe Odea, MD  neomycin-polymyxin b-dexamethasone (MAXITROL) 3.5-10000-0.1 SUSP Place 1 drop into both eyes every 6 (six) hours. Patient taking differently: Place 1 drop into both eyes every 6 (six) hours. For inflammation 08/20/18  Yes Rizwan, Eunice Blase, MD  nystatin (MYCOSTATIN/NYSTOP) powder Apply topically 4 (four) times daily. To groin. 08/09/18  Yes Mesner, Corene Cornea, MD  oxybutynin (DITROPAN) 5 MG tablet Take 5 mg by mouth 2 (two) times daily. 01/22/18  Yes [provider]  potassium chloride SA (K-DUR,KLOR-CON) 20 MEQ tablet Take 2 tablets (40 mEq total) by mouth 2 (two) times daily. 10/22/18  Yes Nita Sells, MD  Silodosin (RAPAFLO) 8 MG CAPS Take 8 mg by mouth daily.    Yes [provider]  vitamin C (VITAMIN C) 500 MG tablet Take 1 tablet (500 mg total) by mouth 2 (two) times daily. 08/20/18  Yes Debbe Odea, MD  nutrition supplement, JUVEN, (JUVEN) PACK Take 1 packet by mouth 2 (two) times daily between meals. Patient not taking: Reported on 10/17/2018 08/20/18   Debbe Odea, MD  traMADol (ULTRAM) 50 MG tablet Take 1 tablet (50 mg total) by mouth every 6 (six) hours as needed (mild pain). Patient not taking: Reported on 11/01/2018 10/22/18   Nita Sells, MD    Physical Exam: BP (!) 90/51   Pulse 76   Temp (!) 96.1 F (35.6 C) (Rectal)   Resp 13   SpO2 96%   . General: Elderly Caucasian male.  Somnolent, minimally responsive. No acute cardiopulmonary distress.  Marland Kitchen HEENT: Normocephalic atraumatic.  Right and left ears normal in appearance.  Pupils equal, round, reactive to light. Extraocular muscles are intact. Sclerae anicteric and  noninjected.  Moist mucosal membranes. No mucosal lesions.  . Neck: Neck supple without lymphadenopathy. No carotid bruits. No masses palpated.  . Cardiovascular: Regular rate with normal S1-S2 sounds. No murmurs, rubs, gallops auscultated. No JVD.  Marland Kitchen Respiratory: Good respiratory effort with no wheezes, rales, rhonchi. Lungs clear to auscultation bilaterally.  No accessory muscle use. . Abdomen: Soft, nontender, nondistended. Active bowel sounds. No masses or hepatosplenomegaly  . Skin: No rashes, lesions, or ulcerations.  Dry, warm to touch. 2+ dorsalis pedis and radial pulses. . Musculoskeletal: No calf or leg pain. All major joints not erythematous nontender.  No upper or lower joint deformation.  Good ROM.  No contractures  . Psychiatric: Unable to assess . Neurologic: No acute neurological findings.  Patient able to move all 4 extremities           Labs on Admission: I have personally reviewed following labs and imaging studies  CBC: Recent Labs  Lab 11/07/2018 1103  WBC 6.8  NEUTROABS 5.6  HGB 9.1*  HCT 31.5*  MCV 98.1  PLT 703   Basic Metabolic Panel: Recent Labs  Lab 11/07/2018 1103  NA 138  K 3.7  CL 117*  CO2 11*  GLUCOSE 81  BUN 81*  CREATININE 5.06*  CALCIUM 8.9   GFR: CrCl cannot be calculated (Unknown ideal weight.). Liver Function Tests: Recent Labs  Lab 11/06/2018 1103  AST 49*  ALT 26  ALKPHOS 100  BILITOT 0.9  PROT 7.1  ALBUMIN 2.4*   No results for input(s): LIPASE, AMYLASE in the last 168 hours. No results for input(s): AMMONIA in the last 168 hours. Coagulation Profile: No results for input(s): INR, PROTIME in the last 168 hours. Cardiac Enzymes: No results for input(s): CKTOTAL, CKMB, CKMBINDEX, TROPONINI in the last 168 hours. BNP (last 3 results) No results for input(s): PROBNP in the last 8760 hours. HbA1C: No results for input(s): HGBA1C in the last 72 hours. CBG: No results for input(s): GLUCAP in the last 168 hours. Lipid  Profile: No results for input(s): CHOL, HDL, LDLCALC, TRIG, CHOLHDL, LDLDIRECT in the last 72 hours. Thyroid Function Tests: No results for input(s): TSH, T4TOTAL, FREET4, T3FREE, THYROIDAB in the last 72 hours. Anemia Panel: No results for input(s): VITAMINB12, FOLATE, FERRITIN, TIBC, IRON, RETICCTPCT in the last 72 hours. Urine analysis:    Component Value Date/Time   COLORURINE YELLOW 10/17/2018 1101   APPEARANCEUR TURBID (A) 11/09/2018 1101   LABSPEC  10/27/2018 1101    TEST NOT REPORTED DUE TO COLOR INTERFERENCE OF URINE PIGMENT   PHURINE  10/24/2018 1101    TEST NOT REPORTED DUE TO COLOR INTERFERENCE OF URINE PIGMENT   GLUCOSEU (A) 11/03/2018 1101    TEST NOT REPORTED DUE TO COLOR INTERFERENCE OF URINE PIGMENT   HGBUR (A) 10/26/2018 1101    TEST NOT REPORTED DUE TO COLOR INTERFERENCE OF URINE PIGMENT   BILIRUBINUR (A) 10/13/2018 1101    TEST NOT REPORTED DUE TO COLOR INTERFERENCE OF URINE PIGMENT   KETONESUR (A) 10/31/2018 1101    TEST NOT REPORTED DUE TO COLOR INTERFERENCE OF URINE PIGMENT   PROTEINUR (A) 11/03/2018 1101    TEST NOT REPORTED DUE TO COLOR INTERFERENCE OF URINE PIGMENT   UROBILINOGEN 0.2 06/09/2011 0522   NITRITE (A) 10/13/2018 1101    TEST NOT REPORTED DUE TO COLOR INTERFERENCE OF URINE PIGMENT   LEUKOCYTESUR (A) 10/23/2018 1101    TEST NOT REPORTED DUE TO COLOR INTERFERENCE OF URINE PIGMENT   Sepsis Labs: @LABRCNTIP (procalcitonin:4,lacticidven:4) )No results found for this or any previous visit (from the past 240 hour(s)).   Radiological Exams on Admission: Dg Chest Port 1 View  Result Date: 11/07/2018 CLINICAL DATA:  Hypotension. EXAM: PORTABLE CHEST 1 VIEW COMPARISON:  10/17/2018 12/06/2017. FINDINGS: Cardiomegaly. Diffuse bilateral mild interstitial prominence. Although these changes are most likely chronic a mild component of CHF cannot be excluded. Pneumonitis can't be excluded. Tiny right pleural effusion. No pneumothorax. IMPRESSION: 1.   Cardiomegaly. 2. Diffuse bilateral mild interstitial prominence. Although these changes are most likely chronic a mild component of CHF cannot be excluded. Pneumonitis cannot be excluded. Tiny right pleural effusion. Electronically Signed   By: Marcello Moores  Register   On: 10/13/2018 11:25    EKG: Independently reviewed. Sinus rhythm with RBBB. No acute ST changes.  Assessment/Plan: Principal Problem:   Severe sepsis with septic shock (HCC) Active Problems:   Type 2 diabetes mellitus with hyperlipidemia (HCC)   HYPERTENSION, BENIGN ESSENTIAL   Chronic diastolic CHF (congestive heart failure) (HCC)   Acute encephalopathy  Acute kidney injury superimposed on CKD (HCC)   UTI (urinary tract infection)   Pressure injury of skin   Elevated LFTs    This patient was discussed with the ED physician, including pertinent vitals, physical exam findings, labs, and imaging.  We also discussed care given by the ED provider.  1. Severe Sepsis with septic shock a. S/p IVF b. Blood cultured c. Urine Culture d. Broad spectrum antibiotics: Vancomycin, cefepime, Flagyl e. Currently low normal blood pressure 2. Acute metabolic encephalopathy a. Secondary to sepsis 3. Metabolic acidosis a. Secondary to sepsis and acute renal failure b. Recheck BMP later this evening c. Continue IV fluids d. If does not appear to be improving, check ABG 4. ARI superimposed on CKD a. Secondary to hypotension 5. UTI a. Treated with broad-spectrum antibiotic 6. Elevated LFT a. Check in the morning 7. Chronic CHF with grade two diastolic a. Currently compensated 8. HTN a. Currently off medications 9. Type 2 diabetes a. CBGs every 6 hours b. Sliding scale insulin 10. Pressure injury of skin  DVT prophylaxis: lovenox, SCDs Consultants: none Code Status: DNR Family Communication: none available Disposition Plan: pending   Truett Mainland, DO [facility] Pager [phone]  If 7PM-7AM, please contact  night-coverage [email or web address] Password [email or web address]

## 2018-11-08 NOTE — ED Provider Notes (Signed)
Level 5 caveat unstable vital signs.  EMS called to nursing home for hypotension today.  EMS treated patient with saline 200 mL in the field..  Patient noted to be hypotensive.  Hypothermic.  Code sepsis called.  Source of infection unclear.  Further history from patient's wife who arrived to the emergency department.  She is healthcare power of attorney.  Patient is DO NOT RESUSCITATE CODE STATUS.  His adult son who is also present is in agreement.  Palliative care consult called   Orlie Dakin, MD 10/16/2018 1151

## 2018-11-08 NOTE — Progress Notes (Signed)
Upon beginning of shift, Pt's CBG randomly assessed and noted to be 67. Pt a/o X 1, with incomprehensive speech.   Hypoglycemic protocol initiated, D50 administered per protocol, MD notified. CBG later reassessed and noted to be 85. RN to continue to assess CBG as ordered and continue to monitor patient.

## 2018-11-09 DIAGNOSIS — A419 Sepsis, unspecified organism: Principal | ICD-10-CM

## 2018-11-09 DIAGNOSIS — R6521 Severe sepsis with septic shock: Secondary | ICD-10-CM

## 2018-11-09 LAB — COMPREHENSIVE METABOLIC PANEL
ALK PHOS: 92 U/L (ref 38–126)
ALT: 23 U/L (ref 0–44)
AST: 52 U/L — ABNORMAL HIGH (ref 15–41)
Albumin: 2.2 g/dL — ABNORMAL LOW (ref 3.5–5.0)
Anion gap: 11 (ref 5–15)
BUN: 75 mg/dL — ABNORMAL HIGH (ref 8–23)
CO2: 10 mmol/L — ABNORMAL LOW (ref 22–32)
Calcium: 8.5 mg/dL — ABNORMAL LOW (ref 8.9–10.3)
Chloride: 120 mmol/L — ABNORMAL HIGH (ref 98–111)
Creatinine, Ser: 5.09 mg/dL — ABNORMAL HIGH (ref 0.61–1.24)
GFR calc Af Amer: 12 mL/min — ABNORMAL LOW (ref >60)
GFR calc non Af Amer: 10 mL/min — ABNORMAL LOW (ref >60)
Glucose, Bld: 117 mg/dL — ABNORMAL HIGH (ref 70–99)
Potassium: 3.6 mmol/L (ref 3.5–5.1)
Sodium: 141 mmol/L (ref 135–145)
Total Bilirubin: 0.6 mg/dL (ref 0.3–1.2)
Total Protein: 6.5 g/dL (ref 6.5–8.1)

## 2018-11-09 LAB — MAGNESIUM: Magnesium: 2 mg/dL (ref 1.7–2.4)

## 2018-11-09 LAB — GLUCOSE, CAPILLARY
Glucose-Capillary: 111 mg/dL — ABNORMAL HIGH (ref 70–99)
Glucose-Capillary: 113 mg/dL — ABNORMAL HIGH (ref 70–99)
Glucose-Capillary: 63 mg/dL — ABNORMAL LOW (ref 70–99)
Glucose-Capillary: 67 mg/dL — ABNORMAL LOW (ref 70–99)

## 2018-11-09 LAB — CBC
HCT: 29 % — ABNORMAL LOW (ref 39.0–52.0)
Hemoglobin: 8.2 g/dL — ABNORMAL LOW (ref 13.0–17.0)
MCH: 27.6 pg (ref 26.0–34.0)
MCHC: 28.3 g/dL — ABNORMAL LOW (ref 30.0–36.0)
MCV: 97.6 fL (ref 80.0–100.0)
Platelets: 244 10*3/uL (ref 150–400)
RBC: 2.97 MIL/uL — ABNORMAL LOW (ref 4.22–5.81)
RDW: 17.2 % — AB (ref 11.5–15.5)
WBC: 6.8 10*3/uL (ref 4.0–10.5)
nRBC: 0 % (ref 0.0–0.2)

## 2018-11-09 LAB — PHOSPHORUS: Phosphorus: 4.5 mg/dL (ref 2.5–4.6)

## 2018-11-09 MED ORDER — GLYCOPYRROLATE 1 MG PO TABS: 1.0000 mg | ORAL_TABLET | ORAL | Status: DC | PRN

## 2018-11-09 MED ORDER — LORAZEPAM 1 MG PO TABS: 1.0000 mg | ORAL_TABLET | ORAL | Status: DC | PRN

## 2018-11-09 MED ORDER — LORAZEPAM 2 MG/ML IJ SOLN: 1.0000 mg | INTRAMUSCULAR | Status: DC | PRN

## 2018-11-09 MED ORDER — ONDANSETRON 4 MG PO TBDP: 4.0000 mg | ORAL_TABLET | Freq: Four times a day (QID) | ORAL | Status: DC | PRN

## 2018-11-09 MED ORDER — HALOPERIDOL LACTATE 2 MG/ML PO CONC
0.5000 mg | ORAL | Status: DC | PRN
  Filled 2018-11-09: qty 0.3

## 2018-11-09 MED ORDER — SODIUM BICARBONATE 8.4 % IV SOLN: INTRAVENOUS | Status: DC

## 2018-11-09 MED ORDER — ONDANSETRON HCL 4 MG/2ML IJ SOLN: 4.0000 mg | Freq: Four times a day (QID) | INTRAMUSCULAR | Status: DC | PRN

## 2018-11-09 MED ORDER — SODIUM CHLORIDE 0.9 % IV SOLN
0.5000 mg/h | INTRAVENOUS | Status: DC
  Administered 2018-11-09: 0.5 mg/h via INTRAVENOUS
  Filled 2018-11-09: qty 5

## 2018-11-09 MED ORDER — POLYVINYL ALCOHOL 1.4 % OP SOLN
1.0000 [drp] | Freq: Four times a day (QID) | OPHTHALMIC | Status: DC | PRN
  Filled 2018-11-09: qty 15

## 2018-11-09 MED ORDER — HYDROMORPHONE BOLUS VIA INFUSION
0.5000 mg | INTRAVENOUS | Status: DC | PRN
  Administered 2018-11-09: 0.5 mg via INTRAVENOUS
  Filled 2018-11-09: qty 1

## 2018-11-09 MED ORDER — BIOTENE DRY MOUTH MT LIQD: 15.0000 mL | OROMUCOSAL | Status: DC | PRN

## 2018-11-09 MED ORDER — GLYCOPYRROLATE 0.2 MG/ML IJ SOLN: 0.2000 mg | INTRAMUSCULAR | Status: DC | PRN

## 2018-11-09 MED ORDER — LORAZEPAM 2 MG/ML PO CONC: 1.0000 mg | ORAL | Status: DC | PRN

## 2018-11-09 MED ORDER — ACETAMINOPHEN 325 MG PO TABS: 650.0000 mg | ORAL_TABLET | Freq: Four times a day (QID) | ORAL | Status: DC | PRN

## 2018-11-09 MED ORDER — HEPARIN SODIUM (PORCINE) 5000 UNIT/ML IJ SOLN: 5000.0000 [IU] | Freq: Three times a day (TID) | INTRAMUSCULAR | Status: DC

## 2018-11-09 MED ORDER — DEXTROSE 50 % IV SOLN
INTRAVENOUS | Status: AC
  Administered 2018-11-09: 50 mL
  Filled 2018-11-09: qty 50

## 2018-11-09 MED ORDER — SODIUM BICARBONATE 8.4 % IV SOLN
INTRAVENOUS | Status: DC
  Filled 2018-11-09: qty 1000

## 2018-11-09 MED ORDER — HALOPERIDOL LACTATE 5 MG/ML IJ SOLN: 0.5000 mg | INTRAMUSCULAR | Status: DC | PRN

## 2018-11-09 MED ORDER — HALOPERIDOL 0.5 MG PO TABS
0.5000 mg | ORAL_TABLET | ORAL | Status: DC | PRN
  Filled 2018-11-09: qty 1

## 2018-11-09 MED ORDER — KCL IN DEXTROSE-NACL 20-5-0.9 MEQ/L-%-% IV SOLN
INTRAVENOUS | Status: DC
  Administered 2018-11-09: 02:00:00 via INTRAVENOUS
  Filled 2018-11-09 (×2): qty 1000

## 2018-11-09 MED ORDER — ACETAMINOPHEN 650 MG RE SUPP: 650.0000 mg | Freq: Four times a day (QID) | RECTAL | Status: DC | PRN

## 2018-11-09 MED ORDER — DIPHENHYDRAMINE HCL 50 MG/ML IJ SOLN: 12.5000 mg | INTRAMUSCULAR | Status: DC | PRN

## 2018-11-09 NOTE — Progress Notes (Signed)
Spoke with the family and they chose [name] for patient.

## 2018-11-09 NOTE — Progress Notes (Signed)
Pt hypoglycemic X 2. Hypoglycemic protocol initiated. MD made aware. New maintenance fluids orders. Pt currently showing no signs of distress. RN to continue to monitor.

## 2018-11-09 NOTE — Progress Notes (Signed)
Long discussion with family-(Spouse, 2 sons at bedside)-patient remains very encephalopathic-blood pressure down to the 15Z systolic.  Family has decided to pursue full comfort measures-stopping all IV antibiotics, IV fluids-no further lab work-starting low-dose Dilaudid infusion-anticipate hospital death.

## 2018-11-09 NOTE — Progress Notes (Signed)
PROGRESS NOTE        PATIENT DETAILS Name: [name] Age: 77 y.o. Sex: male Date of Birth: 1941-10-24 Admit Date: 10/30/2018 Admitting Physician Truett Mainland, DO EGB:TDVVOH, Lattie Haw, MD  Brief Narrative: Patient is a 77 y.o. male with history of CKD stage III, diastolic heart failure, DM-2, hypertension, dyslipidemia, chronic venous stasis, prostate cancer-numerous recent admissions-resident of a local SNF-presenting with encephalopathy in the setting of septic shock and acute renal failure.  See below for further details  Subjective: Confused-wakes up with gentle physical stimuli-mumbles incoherently.  Assessment/Plan: Septic shock: Etiology uncertain-patient encephalopathic-UA appears to be grossly abnormal.  Chest x-ray without any obvious pneumonia.  Blood cultures negative so far.  Patient very frail at baseline-do not think he is a good candidate for aggressive care-DNR in place.  His blood pressure is very soft this morning-and patient remains encephalopathic.  Continue IV fluids and IV antibiotics.  Await culture results.  Spoke with patient's son over the phone-explained that patient is really not much improved and on initial presentation-options regarding continuing current care or transition to hospice care was discussed.  Family to arrive shortly for further discussion.  Continue IV fluids, IV antibiotics-we will follow.  Acute kidney injury on CKD stage III: AKI suspected to be hemodynamically mediated.  Renal function worsening.  Per nursing staff 400 cc of urine output this morning.  Appears to have metabolic acidosis due to worsening AKI.  Other electrolytes are relatively okay.  Do not think patient is a good long-term candidate for HD if renal function continues to worsen.  For now continue with supportive care-avoid nephrotoxic agents-we will await arrival of family for delineation of further goals of care.  Acute metabolic encephalopathy: Per  family-patient does appear to have some cognitive dysfunction at baseline-however currently is very encephalopathic-this is probably secondary to septic shock and acute kidney injury.  Supportive care-await arrival of family for further delineation of goals of care.  Non-gap metabolic acidosis: Likely secondary to sepsis/acute kidney injury-supportive care.  Change IV fluid to D5W with bicarb.  Chronic diastolic heart failure: Euvolemic-cautiously continue with IV fluids-follow.  DM-2: Due to episodes of hypoglycemia-very poor oral intake-stop all siding scale insulin and monitor.  Chronic lower extremity venous stasis: At baseline.  Peripheral neuropathy: Continue to hold Neurontin until mental status improves further  Depression/anxiety/?  Dementia/cognitive dysfunction: Very encephalopathic-continue to hold Wellbutrin, Neurontin, Lexapro  History of prostate cancer  Palliative care: DNR in [place] frail 78 year old resident of a nursing home facility with frequent admissions over the past 1 year-per family-has had declining his overall health-and family had talked with outpatient MDs about palliative care in the past.  He is acutely ill-suspect he is best served by initiation of palliative/hospice care-as his overall prognosis is very poor.  Awaiting arrival of family for further delineation of goals of care this morning.  Admitting MD had consulted palliative care as well.  Will follow   DVT Prophylaxis: Prophylactic Heparin  Code Status: DNR  Family Communication: Son over the phone  Disposition Plan: Remain inpatient  Antimicrobial agents: Anti-infectives (From admission, onward)   Start     Dose/Rate Route Frequency Ordered Stop   12/09/18 0000  vancomycin (VANCOCIN) IVPB 1000 mg/200 mL premix     1,000 mg 200 mL/hr over 60 Minutes Intravenous Every 48 hours 10/22/2018 1236     11/09/18  1200  ceFEPIme (MAXIPIME) 1 g in sodium chloride 0.9 % 100 mL IVPB     1 g 200 mL/hr  over 30 Minutes Intravenous Every 24 hours 10/29/2018 1236     10/30/2018 1145  vancomycin (VANCOCIN) 2,000 mg in sodium chloride 0.9 % 500 mL IVPB     2,000 mg 250 mL/hr over 120 Minutes Intravenous  Once 10/20/2018 1131 11/02/2018 1802   10/21/2018 1145  ceFEPIme (MAXIPIME) 2 g in sodium chloride 0.9 % 100 mL IVPB     2 g 200 mL/hr over 30 Minutes Intravenous  Once 10/24/2018 1131 11/01/2018 1257   11/04/2018 1130  aztreonam (AZACTAM) 2 g in sodium chloride 0.9 % 100 mL IVPB  Status:  Discontinued     2 g 200 mL/hr over 30 Minutes Intravenous  Once 10/30/2018 1121 10/29/2018 1131   11/09/2018 1130  metroNIDAZOLE (FLAGYL) IVPB 500 mg     500 mg 100 mL/hr over 60 Minutes Intravenous Every 8 hours 11/09/2018 1121     11/06/2018 1130  vancomycin (VANCOCIN) IVPB 1000 mg/200 mL premix  Status:  Discontinued     1,000 mg 200 mL/hr over 60 Minutes Intravenous  Once 10/20/2018 1121 10/18/2018 1131      Procedures: None  CONSULTS:  None  Time spent: 40 minutes-Greater than 50% of this time was spent in counseling, explanation of diagnosis, planning of further management, and coordination of care.  MEDICATIONS: Scheduled Meds: . enoxaparin (LOVENOX) injection  30 mg Subcutaneous Q24H  . insulin aspart  0-15 Units Subcutaneous Q6H  . insulin aspart  0-5 Units Subcutaneous QHS   Continuous Infusions: . ceFEPime (MAXIPIME) IV    . dextrose 5 % and 0.9 % NaCl with KCl 20 mEq/L 125 mL/hr at 11/09/18 0400  . metronidazole 500 mg (11/09/18 0334)  . [START ON November 17, 2018] vancomycin     PRN Meds:.acetaminophen **OR** acetaminophen, ondansetron **OR** ondansetron (ZOFRAN) IV   PHYSICAL EXAM: Vital signs: Vitals:   11/09/18 0332 11/09/18 0335 11/09/18 0741 11/09/18 0747  BP: (!) 88/50 96/80 (!) 84/48 (!) 90/50  Pulse: 77 76    Resp: 12 14  15   Temp:  98.2 F (36.8 C) (!) 97.3 F (36.3 C)   TempSrc:  Oral Axillary   SpO2: 98% 97%     There were no vitals filed for this visit. There is no height or weight on  file to calculate BMI.   General appearance: Confused-slight distress-appears uncomfortable. Eyes.Pink conjunctiva HEENT: Atraumatic and Normocephalic Neck: supple Resp:Good air entry bilaterally, no added sounds  CVS: S1 S2 regular GI: Bowel sounds present, Non tender and not distended with no gaurding, rigidity or rebound.No organomegaly Neurology: Seems to be moving all 4 extremities-very difficult exam given encephalopathy. Psychiatric: Unable to evaluate. Musculoskeletal:No digital cyanosis Skin:No Rash, warm and dry Wounds:N/A  I have personally reviewed following labs and imaging studies  LABORATORY DATA: CBC: Recent Labs  Lab 10/26/2018 1103 11/09/18 0329  WBC 6.8 6.8  NEUTROABS 5.6  --   HGB 9.1* 8.2*  HCT 31.5* 29.0*  MCV 98.1 97.6  PLT 238 778    Basic Metabolic Panel: Recent Labs  Lab 11/01/2018 1103 11/02/2018 1827 11/09/18 0329  NA 138 139 141  K 3.7 3.9 3.6  CL 117* 121* 120*  CO2 11* 8* 10*  GLUCOSE 81 70 117*  BUN 81* 78* 75*  CREATININE 5.06* 4.90* 5.09*  CALCIUM 8.9 8.6* 8.5*  MG  --   --  2.0  PHOS  --   --  4.5    GFR: CrCl cannot be calculated (Unknown ideal weight.).  Liver Function Tests: Recent Labs  Lab 10/19/2018 1103 11/09/18 0329  AST 49* 52*  ALT 26 23  ALKPHOS 100 92  BILITOT 0.9 0.6  PROT 7.1 6.5  ALBUMIN 2.4* 2.2*   No results for input(s): LIPASE, AMYLASE in the last 168 hours. No results for input(s): AMMONIA in the last 168 hours.  Coagulation Profile: No results for input(s): INR, PROTIME in the last 168 hours.  Cardiac Enzymes: No results for input(s): CKTOTAL, CKMB, CKMBINDEX, TROPONINI in the last 168 hours.  BNP (last 3 results) No results for input(s): PROBNP in the last 8760 hours.  HbA1C: No results for input(s): HGBA1C in the last 72 hours.  CBG: Recent Labs  Lab 10/30/2018 1926 10/24/2018 2104 11/09/18 0045 11/09/18 0147 11/09/18 0631  GLUCAP 67* 85 63* 113* 111*    Lipid Profile: No results  for input(s): CHOL, HDL, LDLCALC, TRIG, CHOLHDL, LDLDIRECT in the last 72 hours.  Thyroid Function Tests: No results for input(s): TSH, T4TOTAL, FREET4, T3FREE, THYROIDAB in the last 72 hours.  Anemia Panel: No results for input(s): VITAMINB12, FOLATE, FERRITIN, TIBC, IRON, RETICCTPCT in the last 72 hours.  Urine analysis:    Component Value Date/Time   COLORURINE YELLOW 11/02/2018 1101   APPEARANCEUR TURBID (A) 11/03/2018 1101   LABSPEC  10/28/2018 1101    TEST NOT REPORTED DUE TO COLOR INTERFERENCE OF URINE PIGMENT   PHURINE  10/17/2018 1101    TEST NOT REPORTED DUE TO COLOR INTERFERENCE OF URINE PIGMENT   GLUCOSEU (A) 11/06/2018 1101    TEST NOT REPORTED DUE TO COLOR INTERFERENCE OF URINE PIGMENT   HGBUR (A) 10/23/2018 1101    TEST NOT REPORTED DUE TO COLOR INTERFERENCE OF URINE PIGMENT   BILIRUBINUR (A) 10/22/2018 1101    TEST NOT REPORTED DUE TO COLOR INTERFERENCE OF URINE PIGMENT   KETONESUR (A) 10/19/2018 1101    TEST NOT REPORTED DUE TO COLOR INTERFERENCE OF URINE PIGMENT   PROTEINUR (A) 11/03/2018 1101    TEST NOT REPORTED DUE TO COLOR INTERFERENCE OF URINE PIGMENT   UROBILINOGEN 0.2 06/09/2011 0522   NITRITE (A) 10/26/2018 1101    TEST NOT REPORTED DUE TO COLOR INTERFERENCE OF URINE PIGMENT   LEUKOCYTESUR (A) 10/27/2018 1101    TEST NOT REPORTED DUE TO COLOR INTERFERENCE OF URINE PIGMENT    Sepsis Labs: Lactic Acid, Venous    Component Value Date/Time   LATICACIDVEN 0.62 10/17/2018 1111    MICROBIOLOGY: Recent Results (from the past 240 hour(s))  Blood Culture (routine x 2)     Status: None (Preliminary result)   Collection Time: 10/14/2018 11:03 AM  Result Value Ref Range Status   Specimen Description BLOOD SITE NOT SPECIFIED  Final   Special Requests   Final    BOTTLES DRAWN AEROBIC AND ANAEROBIC Blood Culture adequate volume   Culture   Final    NO GROWTH < 24 HOURS Performed at [facility], [place] [address], [place], [place] [address]    Report  Status PENDING  Incomplete  Blood Culture (routine x 2)     Status: None (Preliminary result)   Collection Time: 11/03/2018  6:27 PM  Result Value Ref Range Status   Specimen Description BLOOD RIGHT WRIST  Final   Special Requests   Final    BOTTLES DRAWN AEROBIC ONLY Blood Culture adequate volume   Culture   Final    NO GROWTH < 12 HOURS Performed  at [facility], [place] [address], [place], [place] [address]    Report Status PENDING  Incomplete    RADIOLOGY STUDIES/RESULTS: Dg Chest 2 View  Result Date: 10/17/2018 CLINICAL DATA:  Weakness, recent fall, low back and sacral pain EXAM: CHEST - 2 VIEW COMPARISON:  Portable chest x-ray of 08/18/2017 FINDINGS: There is opacity at the right lung base consistent with atelectasis versus developing pneumonia. The left lung appears clear. Mediastinal and hilar contours are unremarkable and cardiomegaly is stable. There are degenerative changes throughout the thoracic spine and within the shoulders. IMPRESSION: 1. Linear atelectasis or developing pneumonia at the right lung base. 2. Stable cardiomegaly. Electronically Signed   By: Ivar Drape M.D.   On: 10/17/2018 17:25   Dg Lumbar Spine Complete  Result Date: 10/17/2018 CLINICAL DATA:  Recent fall, low back pain, weakness EXAM: LUMBAR SPINE - COMPLETE 4+ VIEW COMPARISON:  CT abdomen pelvis of 12/09/2009. FINDINGS: There is a lumbar scoliosis convex to the left by approximately 32 degrees. Significant degenerative change is present with osteophytes from L3-S1. No acute compression deformity is seen. The SI joints are corticated. There is significant degenerative joint disease involving both hips as well. IMPRESSION: 1. Lumbar scoliosis convex to the left with diffuse degenerative spurring and degenerative disc disease. No acute compression deformity. 2. Degenerative change of both hips. Electronically Signed   By: Ivar Drape M.D.   On: 10/17/2018 17:21   Dg Sacrum/coccyx  Result Date:  10/17/2018 CLINICAL DATA:  Increasing weakness with recent fall, low back and sacral pain EXAM: SACRUM AND COCCYX - 2+ VIEW COMPARISON:  None. FINDINGS: The sacrococcygeal elements are in normal alignment. The bones are somewhat osteopenic. No acute fracture is seen. The pelvic rami appear intact and the SI joints are corticated. The sacral foramina appear corticated. There is significant degenerative joint disease of the hips, right greater than left. IMPRESSION: 1. No acute fracture.  Diffuse osteopenia. 2. Significant degenerative joint disease of the hips, right greater than left. Electronically Signed   By: Ivar Drape M.D.   On: 10/17/2018 17:22   Dg Chest Port 1 View  Result Date: 11/04/2018 CLINICAL DATA:  Hypotension. EXAM: PORTABLE CHEST 1 VIEW COMPARISON:  10/17/2018 12/06/2017. FINDINGS: Cardiomegaly. Diffuse bilateral mild interstitial prominence. Although these changes are most likely chronic a mild component of CHF cannot be excluded. Pneumonitis can't be excluded. Tiny right pleural effusion. No pneumothorax. IMPRESSION: 1.  Cardiomegaly. 2. Diffuse bilateral mild interstitial prominence. Although these changes are most likely chronic a mild component of CHF cannot be excluded. Pneumonitis cannot be excluded. Tiny right pleural effusion. Electronically Signed   By: Marcello Moores  Register   On: 10/23/2018 11:25     LOS: 1 day   Oren Binet, MD  [facility]  If 7PM-7AM, please contact night-coverage  Please page via [email or web address]-Password [email or web address]-click on MD name and type text message  11/09/2018, 10:35 AM

## 2018-11-09 NOTE — Progress Notes (Signed)
Patient removed from monitor. Dilaudid 0.5mg /hr infusion started at 1256. 0.5mg  bolus administered for patient being restless at 1304. Patient resting comfortably since. Will continue to monitor patient.

## 2018-11-10 DEATH — deceased

## 2018-11-11 LAB — URINE CULTURE
Culture: >=100000 — AB
Culture: >=100000 — AB

## 2018-11-13 LAB — CULTURE, BLOOD (ROUTINE X 2)
Culture: NO GROWTH
Culture: NO GROWTH
Special Requests: ADEQUATE
Special Requests: ADEQUATE

## 2018-12-11 NOTE — Death Summary Note (Signed)
DEATH SUMMARY   Patient Details  Name: [name] MRN: [id number] DOB: [birth year]  Admission/Discharge Information   Admit Date:  29-Nov-2018  Date of Death: Date of Death: 12-01-2018  Time of Death: Time of Death: 0115  Length of Stay: 2  Referring Physician: Kathyrn Lass, MD   Reason(s) for Hospitalization  Patient is a 78 y.o. male with history of CKD stage III, diastolic heart failure, DM-2, hypertension, dyslipidemia, chronic venous stasis, prostate cancer-numerous recent admissions-resident of a local SNF-presented with encephalopathy in the setting of septic shock and acute renal failure.  Diagnoses  Preliminary cause of death:  Secondary Diagnoses (including complications and co-morbidities):  Principal Problem:   Severe sepsis with septic shock ([place]) Active Problems:   Type 2 diabetes mellitus with hyperlipidemia (HCC)   HYPERTENSION, BENIGN ESSENTIAL   Chronic diastolic CHF (congestive heart failure) (HCC)   Acute encephalopathy   Acute kidney injury superimposed on CKD (HCC)   UTI (urinary tract infection)   Pressure injury of skin   Elevated LFTs   Metabolic acidosis   Brief Hospital Course (including significant findings, care, treatment, and services provided and events leading to death)  Brief Narrative: Patient is a 78 y.o. male with history of CKD stage III, diastolic heart failure, DM-2, hypertension, dyslipidemia, chronic venous stasis, prostate cancer-numerous recent admissions-resident of a local SNF-presented with encephalopathy in the setting of septic shock and acute renal failure.    Even with supportive care-patient continued to deteriorate-became persistently hypotensive and encephalopathic-after discussion with family-patient was transition to full comfort measures.  Patient subsequently expired on 12-02-23.  See below for further details  Assessment/Plan: Septic shock: Likely secondary to complicated UTI-urine cultures growing Klebsiella pneumoniae.   Blood cultures negative so far.  Unfortunately even with antibiotic therapy-patient continued to deteriorate.  Patient was persistently hypotensive and encephalopathic.  After discussion with family-he was transition to full comfort measures-all IV fluids and antibiotics were discontinued.    Acute kidney injury on CKD stage III: AKI suspected to be hemodynamically mediated.  Unfortunately-renal function continues to worsen.  Not felt to be a good long-term candidate for hemodialysis.  After extensive discussion with family-we will transition to full comfort measures-patient expired on December 02, 2023.    Acute metabolic encephalopathy: Per family-patient does appear to have some cognitive dysfunction at baseline-however became persistently encephalopathic.  Transitioned to home for care measures-see above.    Non-gap metabolic acidosis: Likely secondary to sepsis/acute kidney injury  Chronic diastolic heart failure: Euvolemic   Pertinent Labs and Studies  Significant Diagnostic Studies Dg Chest 2 View  Result Date: 10/17/2018 CLINICAL DATA:  Weakness, recent fall, low back and sacral pain EXAM: CHEST - 2 VIEW COMPARISON:  Portable chest x-ray of 08/18/2017 FINDINGS: There is opacity at the right lung base consistent with atelectasis versus developing pneumonia. The left lung appears clear. Mediastinal and hilar contours are unremarkable and cardiomegaly is stable. There are degenerative changes throughout the thoracic spine and within the shoulders. IMPRESSION: 1. Linear atelectasis or developing pneumonia at the right lung base. 2. Stable cardiomegaly. Electronically Signed   By: Ivar Drape M.D.   On: 10/17/2018 17:25   Dg Lumbar Spine Complete  Result Date: 10/17/2018 CLINICAL DATA:  Recent fall, low back pain, weakness EXAM: LUMBAR SPINE - COMPLETE 4+ VIEW COMPARISON:  CT abdomen pelvis of 12/09/2009. FINDINGS: There is a lumbar scoliosis convex to the left by approximately 32 degrees. Significant  degenerative change is present with osteophytes from L3-S1. No acute compression deformity is seen.  The SI joints are corticated. There is significant degenerative joint disease involving both hips as well. IMPRESSION: 1. Lumbar scoliosis convex to the left with diffuse degenerative spurring and degenerative disc disease. No acute compression deformity. 2. Degenerative change of both hips. Electronically Signed   By: Ivar Drape M.D.   On: 10/17/2018 17:21   Dg Sacrum/coccyx  Result Date: 10/17/2018 CLINICAL DATA:  Increasing weakness with recent fall, low back and sacral pain EXAM: SACRUM AND COCCYX - 2+ VIEW COMPARISON:  None. FINDINGS: The sacrococcygeal elements are in normal alignment. The bones are somewhat osteopenic. No acute fracture is seen. The pelvic rami appear intact and the SI joints are corticated. The sacral foramina appear corticated. There is significant degenerative joint disease of the hips, right greater than left. IMPRESSION: 1. No acute fracture.  Diffuse osteopenia. 2. Significant degenerative joint disease of the hips, right greater than left. Electronically Signed   By: Ivar Drape M.D.   On: 10/17/2018 17:22   Dg Chest Port 1 View  Result Date: 10/24/2018 CLINICAL DATA:  Hypotension. EXAM: PORTABLE CHEST 1 VIEW COMPARISON:  10/17/2018 12/06/2017. FINDINGS: Cardiomegaly. Diffuse bilateral mild interstitial prominence. Although these changes are most likely chronic a mild component of CHF cannot be excluded. Pneumonitis can't be excluded. Tiny right pleural effusion. No pneumothorax. IMPRESSION: 1.  Cardiomegaly. 2. Diffuse bilateral mild interstitial prominence. Although these changes are most likely chronic a mild component of CHF cannot be excluded. Pneumonitis cannot be excluded. Tiny right pleural effusion. Electronically Signed   By: Marcello Moores  Register   On: 11/07/2018 11:25    Microbiology Recent Results (from the past 240 hour(s))  Blood Culture (routine x 2)     Status:  None (Preliminary result)   Collection Time: 10/22/2018 11:03 AM  Result Value Ref Range Status   Specimen Description BLOOD SITE NOT SPECIFIED  Final   Special Requests   Final    BOTTLES DRAWN AEROBIC AND ANAEROBIC Blood Culture adequate volume   Culture   Final    NO GROWTH 1 DAY Performed at [facility], [address], [place], [place] [address]    Report Status PENDING  Incomplete  Blood Culture (routine x 2)     Status: None (Preliminary result)   Collection Time: 10/27/2018  6:27 PM  Result Value Ref Range Status   Specimen Description BLOOD RIGHT WRIST  Final   Special Requests   Final    BOTTLES DRAWN AEROBIC ONLY Blood Culture adequate volume   Culture   Final    NO GROWTH < 24 HOURS Performed at [facility], [place] [address] [place],  [address]    Report Status PENDING  Incomplete    Lab Basic Metabolic Panel: Recent Labs  Lab 10/27/2018 1103 10/12/2018 1827 11/09/18 0329  NA 138 139 141  K 3.7 3.9 3.6  CL 117* 121* 120*  CO2 11* 8* 10*  GLUCOSE 81 70 117*  BUN 81* 78* 75*  CREATININE 5.06* 4.90* 5.09*  CALCIUM 8.9 8.6* 8.5*  MG  --   --  2.0  PHOS  --   --  4.5   Liver Function Tests: Recent Labs  Lab 10/14/2018 1103 11/09/18 0329  AST 49* 52*  ALT 26 23  ALKPHOS 100 92  BILITOT 0.9 0.6  PROT 7.1 6.5  ALBUMIN 2.4* 2.2*   No results for input(s): LIPASE, AMYLASE in the last 168 hours. No results for input(s): AMMONIA in the last 168 hours. CBC: Recent Labs  Lab 10/26/2018 1103 11/09/18 0329  WBC 6.8 6.8  NEUTROABS 5.6  --   HGB 9.1* 8.2*  HCT 31.5* 29.0*  MCV 98.1 97.6  PLT 238 244   Cardiac Enzymes: No results for input(s): CKTOTAL, CKMB, CKMBINDEX, TROPONINI in the last 168 hours. Sepsis Labs: Recent Labs  Lab 11/01/2018 1103 11/05/2018 1111 11/09/18 0329  WBC 6.8  --  6.8  LATICACIDVEN  --  0.62  --     Procedures/Operations     Emya Picado 13-Nov-2018, 7:45 AM

## 2018-12-11 NOTE — Progress Notes (Signed)
Son had been at bedside a couple of hours watching his father and discussing the fact they do not expect him to make it. Around 0110 son approached desk and notified another RN that he dozed off and awoke to find he was no longer breathing. This RN and Venancio Poisson confirmed patient to have no pulse and no breathing. MD notified, [place] [facility] notified and indicate that patient is not a candidate. MD completed death certificate and post-mortem care performed.

## 2018-12-11 NOTE — Consult Note (Signed)
Palliative Care  Consult Note Req: [facility]  Mr. Ronda Fairly is a 78 yo gentleman with multiple chronic medical problems admitted from [facility]F with sepsis and acute renal failure. Palliative Care consult requested for terminal care and transition to full comfort. At the time of visit goals have been established by the hospitalist for full comfort. He was was lying comfortable in bed, no distress, not following commands nor was he arousable with gentle stimulation. He has been started on a hydromorphone infusion at 0.5mg /hr and all other medications have been discontinued. He has appropriate palliative PRNs and prophylaxis.  I anticipate a hospital death. There is no family at the bedside, but I have discussed his care with the hospitalist and also reached out to family to answer questions and provider support during the EOL process. Will follow for any additional palliative care needs.  Lane Hacker, DO Palliative Medicine [phone]  Time: 30 minutes Greater than 50%  of this time was spent counseling and coordinating care related to the above assessment and plan.

## 2018-12-11 NOTE — Progress Notes (Signed)
After patient's death, 100 ml of dilaudid drip was wasted by this RN and Lois Huxley.

## 2018-12-11 DEATH — deceased

## 2020-02-28 IMAGING — CR DG CHEST 2V
2 series · 2 of 2 positions shown · non-contrast
Comparison: Portable chest x-ray of 08/18/2017

CLINICAL DATA: Weakness, recent fall, low back and sacral pain

EXAM:
CHEST - 2 VIEW

[chest lat]
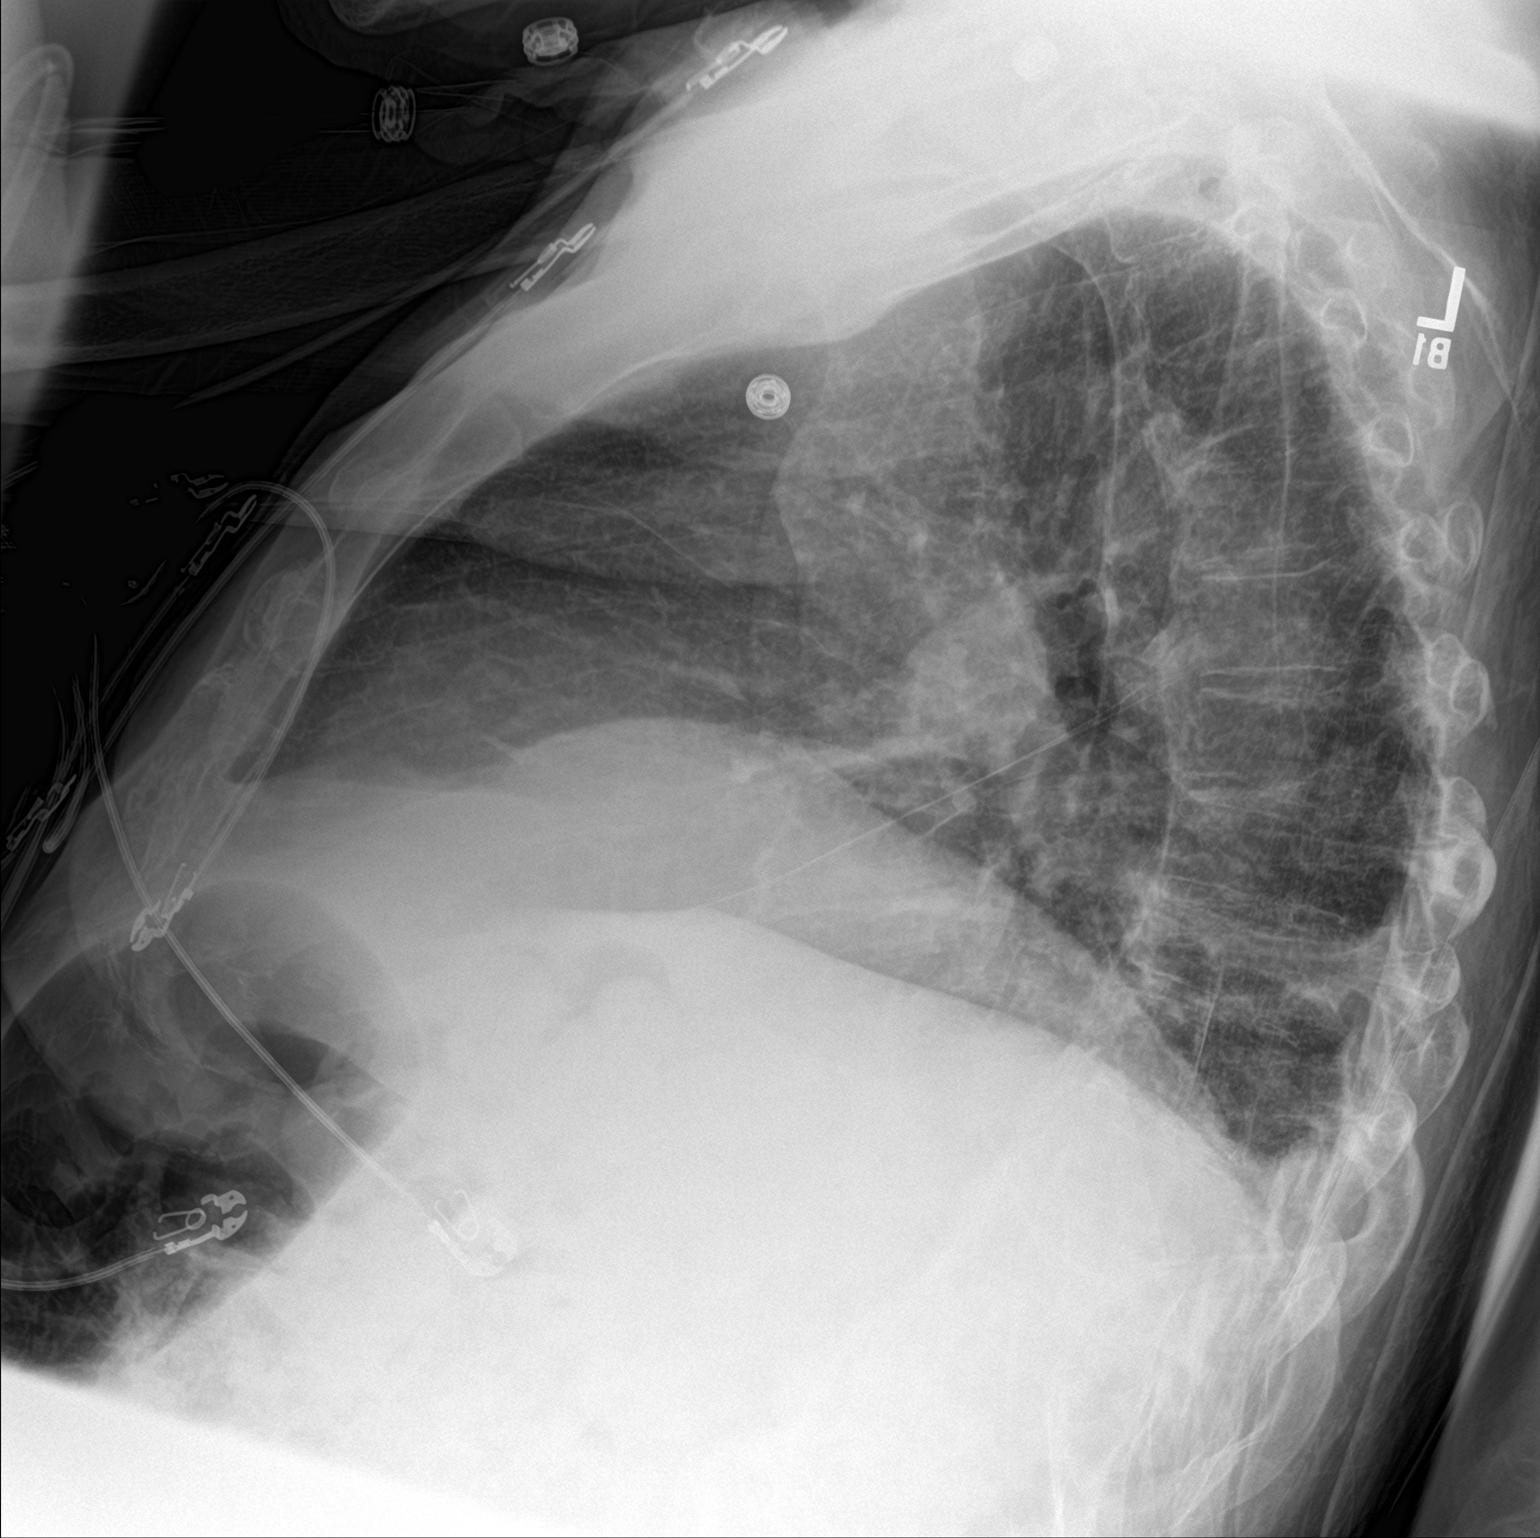

[chest ap]
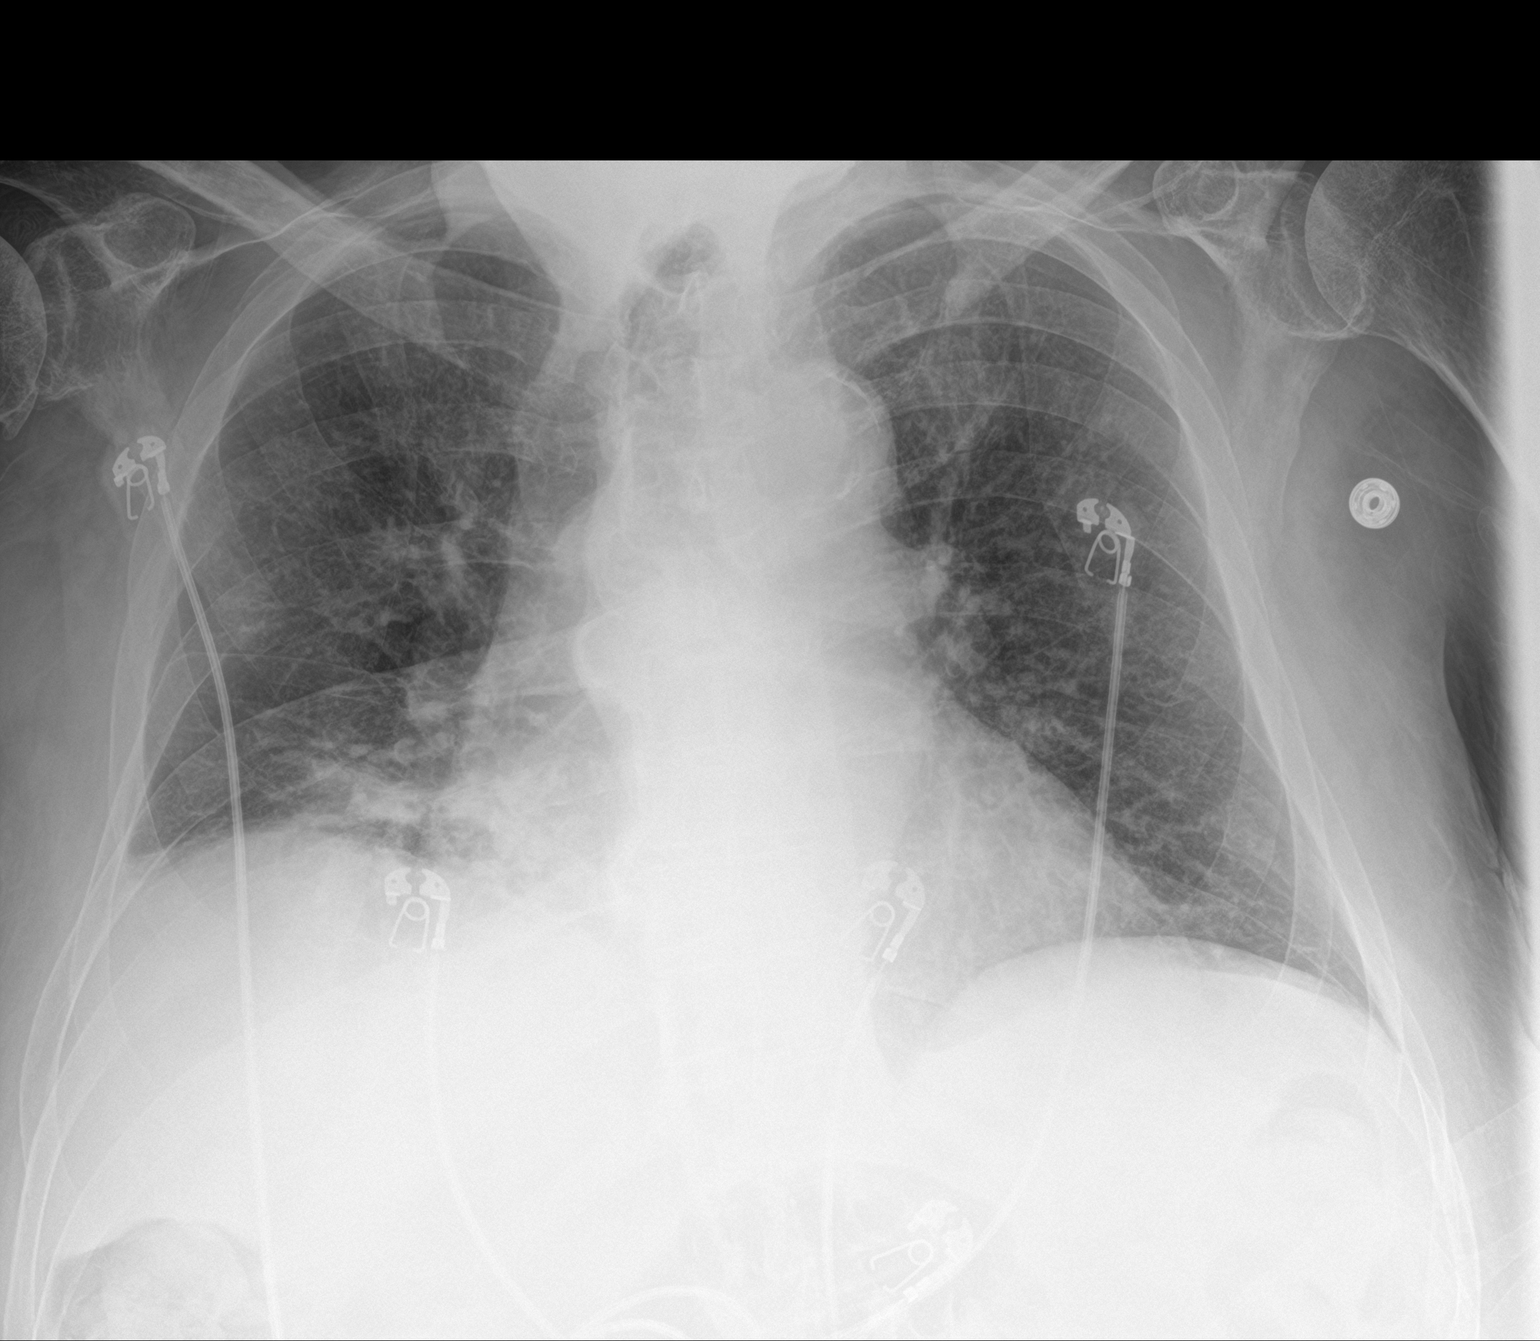

[2 of 2 positions shown; findings below may reference images not displayed]

FINDINGS: There is opacity at the right lung base consistent with atelectasis
versus developing pneumonia. The left lung appears clear.
Mediastinal and hilar contours are unremarkable and cardiomegaly is
stable. There are degenerative changes throughout the thoracic spine
and within the shoulders.
IMPRESSION: 1. Linear atelectasis or developing pneumonia at the right lung
base.
2. Stable cardiomegaly.

## 2020-02-28 IMAGING — CR DG LUMBAR SPINE COMPLETE 4+V
5 series · 5 of 5 positions shown · non-contrast
Comparison: CT abdomen pelvis of 12/09/2009.

CLINICAL DATA: Recent fall, low back pain, weakness

EXAM:
LUMBAR SPINE - COMPLETE 4+ VIEW

[l-spine ap]
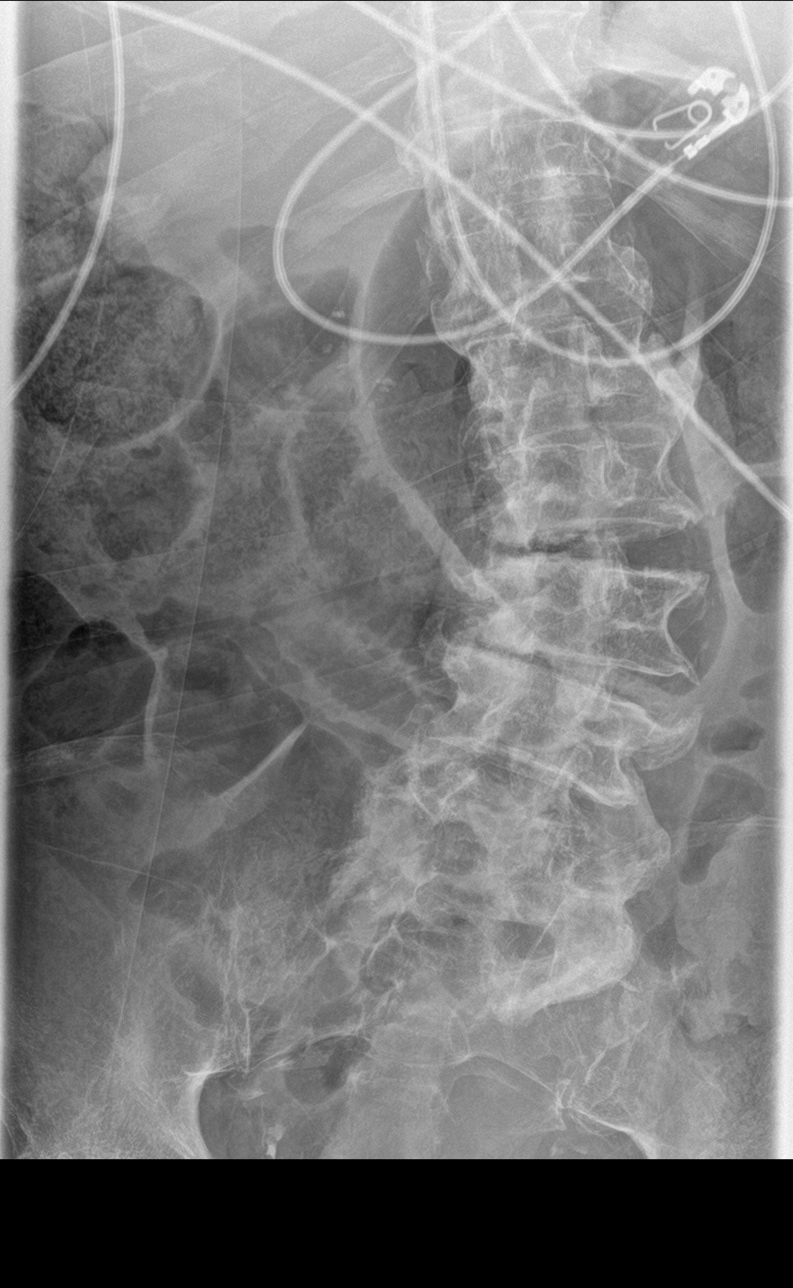

[l-spine obl (1 of 2)]
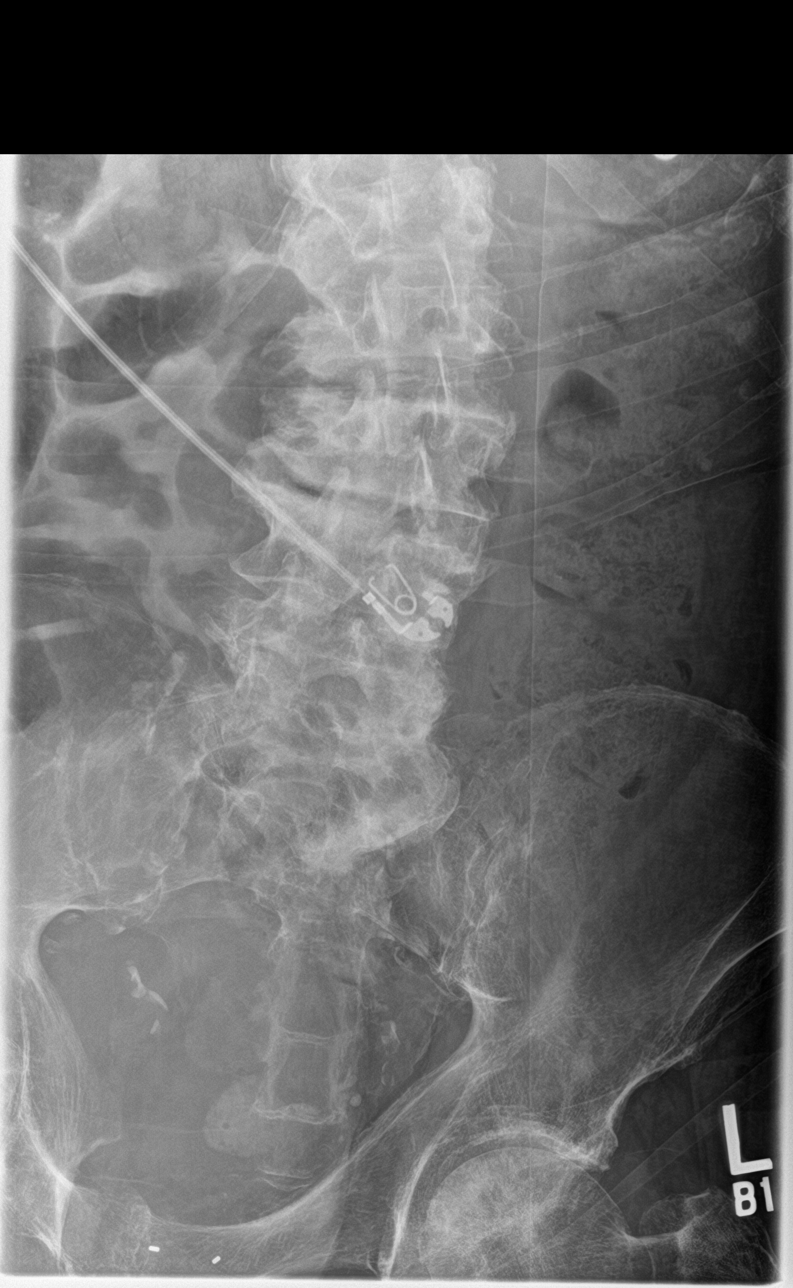

[l-spine obl (2 of 2)]
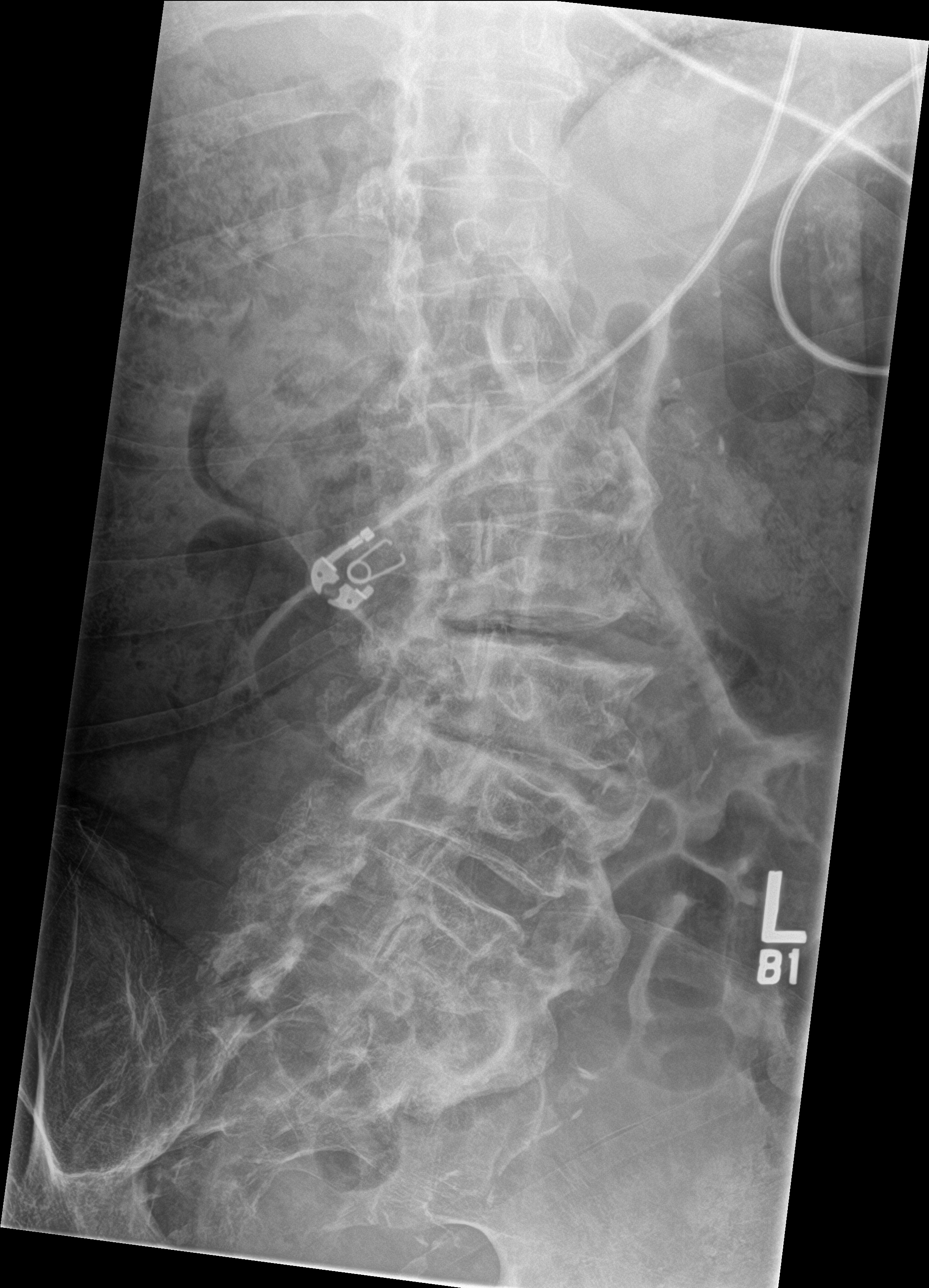

[l-spine lat]
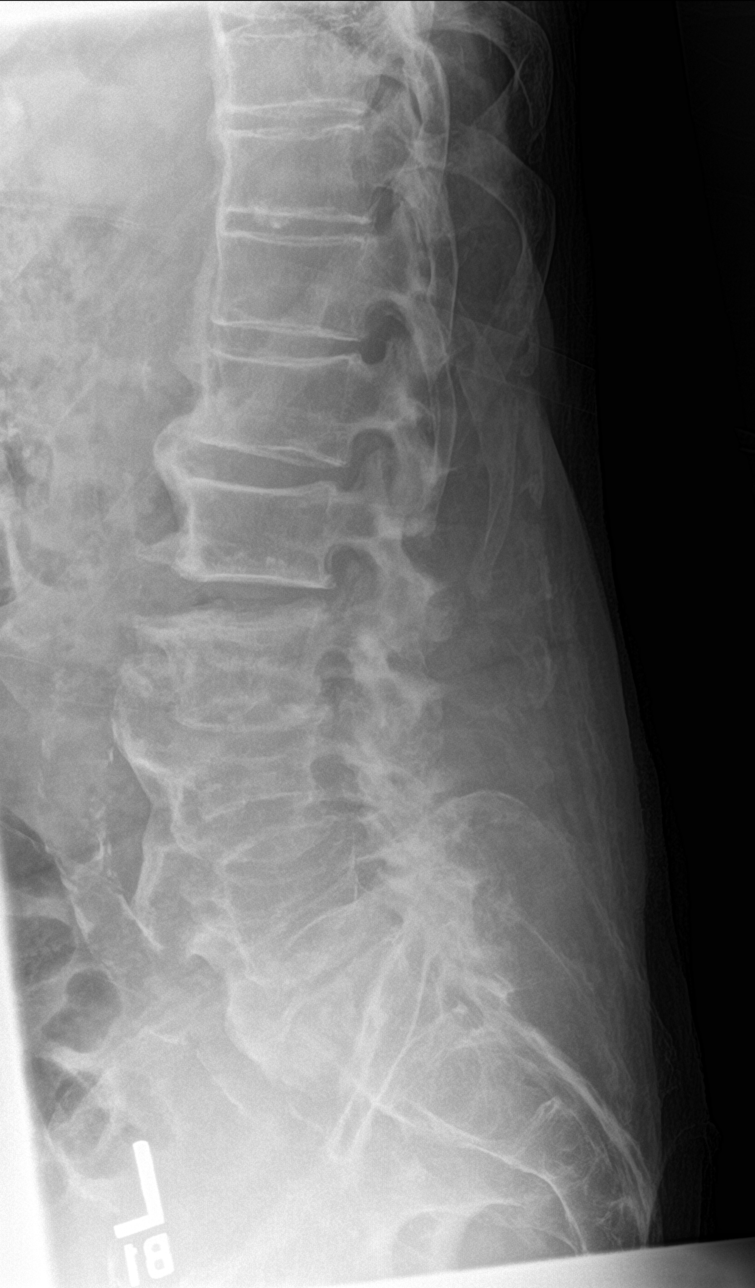

[l-spine spot]
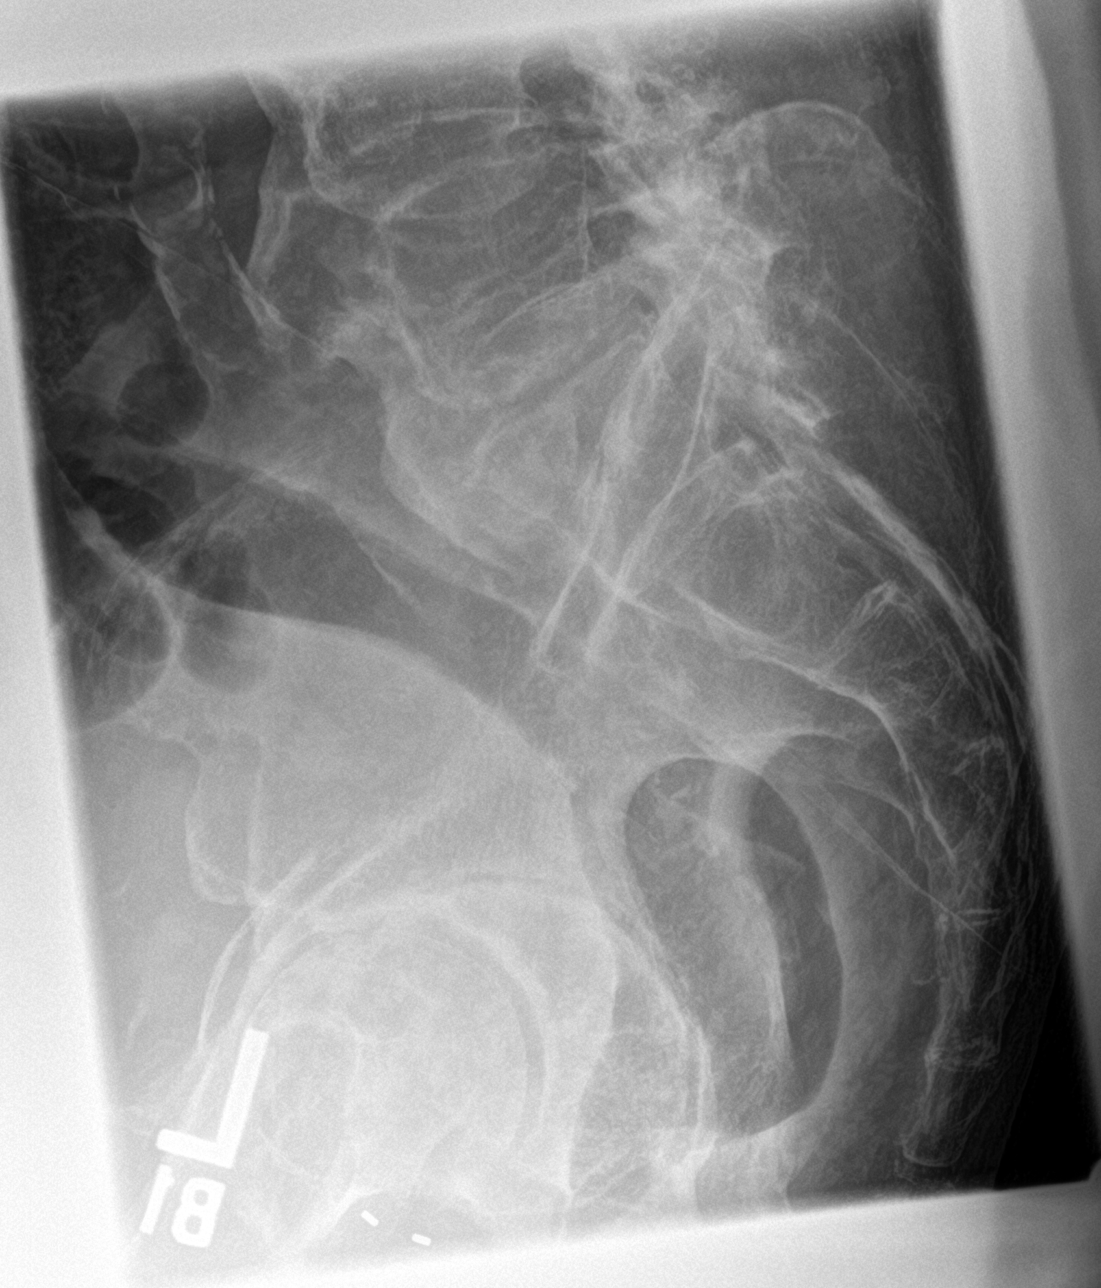

[5 of 5 positions shown; findings below may reference images not displayed]

FINDINGS: There is a lumbar scoliosis convex to the left by approximately 32
degrees. Significant degenerative change is present with osteophytes
from L3-S1. No acute compression deformity is seen. The SI joints
are corticated. There is significant degenerative joint disease
involving both hips as well.
IMPRESSION: 1. Lumbar scoliosis convex to the left with diffuse degenerative
spurring and degenerative disc disease. No acute compression
deformity.
2. Degenerative change of both hips.
